# Patient Record
Sex: Female | Born: 1955
Health system: Southern US, Community
[De-identification: ages and names within clinical notes are randomized; demographics above are authoritative.]

## PROBLEM LIST (undated history)

## (undated) ENCOUNTER — Encounter

## (undated) ENCOUNTER — Ambulatory Visit: Payer: PRIVATE HEALTH INSURANCE

## (undated) ENCOUNTER — Ambulatory Visit

## (undated) ENCOUNTER — Encounter: Attending: Internal Medicine | Primary: Internal Medicine

## (undated) ENCOUNTER — Telehealth: Attending: Acute Care | Primary: Acute Care

## (undated) ENCOUNTER — Encounter: Attending: Primary Care | Primary: Primary Care

## (undated) ENCOUNTER — Encounter: Attending: Nurse Practitioner | Primary: Nurse Practitioner

## (undated) ENCOUNTER — Telehealth

## (undated) ENCOUNTER — Encounter: Attending: Cardiovascular Disease | Primary: Cardiovascular Disease

## (undated) ENCOUNTER — Ambulatory Visit: Attending: Cardiovascular Disease | Primary: Cardiovascular Disease

## (undated) ENCOUNTER — Encounter
Attending: Pharmacist Clinician (PhC)/ Clinical Pharmacy Specialist | Primary: Pharmacist Clinician (PhC)/ Clinical Pharmacy Specialist

## (undated) ENCOUNTER — Encounter: Attending: Oncology | Primary: Oncology

## (undated) ENCOUNTER — Ambulatory Visit: Payer: PRIVATE HEALTH INSURANCE | Attending: Nurse Practitioner | Primary: Nurse Practitioner

## (undated) ENCOUNTER — Encounter: Attending: Acute Care | Primary: Acute Care

## (undated) ENCOUNTER — Encounter: Attending: Hematology & Oncology | Primary: Hematology & Oncology

## (undated) DIAGNOSIS — C801 Malignant (primary) neoplasm, unspecified: Secondary | ICD-10-CM

## (undated) DIAGNOSIS — C9590 Leukemia, unspecified not having achieved remission: Secondary | ICD-10-CM

## (undated) DIAGNOSIS — M810 Age-related osteoporosis without current pathological fracture: Secondary | ICD-10-CM

## (undated) DIAGNOSIS — Z9221 Personal history of antineoplastic chemotherapy: Secondary | ICD-10-CM

## (undated) HISTORY — PX: SHOULDER SURGERY: SHX246

## (undated) HISTORY — PX: BREAST BIOPSY: SHX20

## (undated) HISTORY — PX: LIMBAL STEM CELL TRANSPLANT: SHX1969

## (undated) MED ORDER — ASCORBIC ACID (VITAMIN C) 500 MG TABLET: ORAL | 0 days

---
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
## 1975-01-12 HISTORY — PX: MOLE REMOVAL: SHX2046

## 2006-01-20 DIAGNOSIS — C4491 Basal cell carcinoma of skin, unspecified: Secondary | ICD-10-CM

## 2006-01-20 HISTORY — DX: Basal cell carcinoma of skin, unspecified: C44.91

## 2007-01-16 DIAGNOSIS — D239 Other benign neoplasm of skin, unspecified: Secondary | ICD-10-CM

## 2007-01-16 HISTORY — DX: Other benign neoplasm of skin, unspecified: D23.9

## 2007-03-16 ENCOUNTER — Ambulatory Visit: Payer: Self-pay | Admitting: Nurse Practitioner

## 2007-05-11 ENCOUNTER — Ambulatory Visit: Payer: Self-pay | Admitting: Unknown Physician Specialty

## 2008-03-21 ENCOUNTER — Ambulatory Visit: Payer: Self-pay | Admitting: Nurse Practitioner

## 2009-05-15 ENCOUNTER — Ambulatory Visit: Payer: Self-pay | Admitting: Nurse Practitioner

## 2010-01-11 HISTORY — PX: OTHER SURGICAL HISTORY: SHX169

## 2010-05-24 ENCOUNTER — Ambulatory Visit: Payer: Self-pay | Admitting: Internal Medicine

## 2010-05-28 ENCOUNTER — Ambulatory Visit: Payer: Self-pay | Admitting: Internal Medicine

## 2010-11-17 ENCOUNTER — Ambulatory Visit: Payer: Self-pay

## 2010-11-17 ENCOUNTER — Ambulatory Visit: Payer: Self-pay | Admitting: Unknown Physician Specialty

## 2011-05-19 ENCOUNTER — Ambulatory Visit: Payer: Self-pay | Admitting: Internal Medicine

## 2011-06-09 ENCOUNTER — Ambulatory Visit: Payer: Self-pay | Admitting: Internal Medicine

## 2012-03-28 ENCOUNTER — Ambulatory Visit: Payer: Self-pay

## 2012-06-15 ENCOUNTER — Ambulatory Visit: Payer: Self-pay | Admitting: Internal Medicine

## 2013-06-07 LAB — BASIC METABOLIC PANEL
BUN: 15 mg/dL (ref 4–21)
Creatinine: 0.8 mg/dL (ref 0.5–1.1)
GLUCOSE: 89 mg/dL
Sodium: 142 mmol/L (ref 137–147)

## 2013-06-07 LAB — TSH: TSH: 0.74 u[IU]/mL (ref 0.41–5.90)

## 2013-06-07 LAB — HEPATIC FUNCTION PANEL: Bilirubin, Total: 0.3 mg/dL

## 2013-07-12 ENCOUNTER — Ambulatory Visit: Payer: Self-pay | Admitting: Family Medicine

## 2013-07-26 ENCOUNTER — Ambulatory Visit: Payer: Self-pay | Admitting: Family Medicine

## 2013-07-26 LAB — HM MAMMOGRAPHY

## 2013-09-06 LAB — HM DEXA SCAN

## 2013-11-11 LAB — HM COLONOSCOPY

## 2013-11-15 ENCOUNTER — Ambulatory Visit: Payer: Self-pay | Admitting: Unknown Physician Specialty

## 2014-02-14 ENCOUNTER — Ambulatory Visit: Payer: Self-pay | Admitting: Nurse Practitioner

## 2014-07-16 ENCOUNTER — Encounter (INDEPENDENT_AMBULATORY_CARE_PROVIDER_SITE_OTHER): Payer: Self-pay

## 2014-07-16 ENCOUNTER — Encounter: Payer: Self-pay | Admitting: Nurse Practitioner

## 2014-07-16 ENCOUNTER — Telehealth: Payer: Self-pay | Admitting: Nurse Practitioner

## 2014-07-16 ENCOUNTER — Ambulatory Visit (INDEPENDENT_AMBULATORY_CARE_PROVIDER_SITE_OTHER): Payer: No Typology Code available for payment source | Admitting: Nurse Practitioner

## 2014-07-16 VITALS — BP 118/88 | HR 92 | Temp 98.2°F | Resp 16 | Ht 65.0 in | Wt 158.9 lb

## 2014-07-16 DIAGNOSIS — Z7689 Persons encountering health services in other specified circumstances: Secondary | ICD-10-CM

## 2014-07-16 DIAGNOSIS — M810 Age-related osteoporosis without current pathological fracture: Secondary | ICD-10-CM

## 2014-07-16 DIAGNOSIS — Z7189 Other specified counseling: Secondary | ICD-10-CM | POA: Diagnosis not present

## 2014-07-16 NOTE — Patient Instructions (Addendum)
Welcome to [facility]! Nice to meet you!   Please make an appointment to do your breast exam, PAP, and lab work.

## 2014-07-16 NOTE — Progress Notes (Signed)
Pre visit review using our clinic review tool, if applicable. No additional management support is needed unless otherwise documented below in the visit note. 

## 2014-07-16 NOTE — Telephone Encounter (Signed)
Pt would like to have labs done the same day as appt. Her next appt is 8.9.16 @ 9:30. Please advise.

## 2014-07-16 NOTE — Telephone Encounter (Signed)
Pt request lab work on the same day as upcoming appt. No lab orders in the systems. Please advise/msn

## 2014-07-16 NOTE — Telephone Encounter (Signed)
That will be fine and I will place orders. She is aware they are fasting.

## 2014-07-16 NOTE — Progress Notes (Signed)
Subjective:    Patient ID: Robyn Montgomery, female    DOB: 03-13-1955, 59 y.o.   MRN: [id number]  HPI   Robyn Montgomery is a 59 yo female with a establishing care today CC of questions on Evista.   1) Health Maintenance-   Diet- Tries to eat a fairly healthy diet   Exercise- Classes 4 x a week, walks 4 miles 4-5 x a week   Immunizations- unknown   Mammogram- 01/2014, Had one in June 2015- saw something suspicious and had her come back in Jan. Good for 1 year from Jan.   Pap- 2013, Due for one   Bone Density- Bone Density scan in 2013, Raloxifene   Colonoscopy- 2015, no problems   Eye Exam- 2/15  Dental Exam- UTD  LMP- 2000  2) Chronic Problems- N/A  3) Acute Problems-   Osteoporosis- Bone density scan in 2013, been on Raloxifene for years and would like to know if she can stop this, when she needs another scan, and asked about side effects.   Lab work in May of last year   Review of Systems  Constitutional: Negative for fever, chills, diaphoresis and fatigue.  HENT: Negative for sore throat and tinnitus.   Eyes: Negative for visual disturbance.  Respiratory: Negative for chest tightness, shortness of breath and wheezing.   Cardiovascular: Negative for chest pain, palpitations and leg swelling.  Gastrointestinal: Negative for nausea, vomiting and diarrhea.  Genitourinary: Negative for vaginal bleeding, vaginal discharge, difficulty urinating and vaginal pain.  Musculoskeletal: Negative for back pain and neck pain.  Skin: Negative for rash.  Neurological: Negative for dizziness, tremors, weakness, numbness and headaches.  Hematological: Does not bruise/bleed easily.  Psychiatric/Behavioral: Negative for suicidal ideas and sleep disturbance. The patient is not nervous/anxious.    History reviewed. No pertinent past medical history.  History   Social History  . Marital Status: Married    Spouse Name: N/A  . Number of Children: N/A  . Years of Education: N/A   Occupational  History  . Not on file.   Social History Main Topics  . Smoking status: Never Smoker   . Smokeless tobacco: Never Used  . Alcohol Use: 0.0 oz/week    0 Standard drinks or equivalent per week     Comment: Rare  . Drug Use: No  . Sexual Activity:    Partners: Male     Comment: Husband    Other Topics Concern  . 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 Mole removal  1977    Removed from left breast  . Broken shoulder Right 2012    Plates and screws put in    Family History  Problem Relation Age of Onset  . Arthritis Mother   . Hypertension Mother   . Cancer Mother 8    Breast  . Osteoporosis Mother   . Parkinsonism Father   . Heart disease Maternal Uncle   . Cancer Paternal Grandfather     Oral    Not on File  No current outpatient prescriptions on file prior to visit.   No current facility-administered medications on file prior to visit.       Objective:  Physical Exam  Constitutional: She is oriented to person, place, and time. She appears well-developed and well-nourished. No distress.  BP 118/88 mmHg  Pulse 92  Temp(Src) 98.2 F (36.8 C)  Resp 16  Ht 5\' 5"  (1.651 m)  Wt 158 lb 14.4 oz (72.077 kg)  BMI 26.44 kg/m2  SpO2 96%   HENT:  Head: Normocephalic and atraumatic.  Right Ear: External ear normal.  Left Ear: External ear normal.  Nose: Nose normal.  Mouth/Throat: Oropharynx is clear and moist. No oropharyngeal exudate.  TMs and canals clear bilaterally  Eyes: Conjunctivae and EOM are normal. Pupils are equal, round, and reactive to light. Right eye exhibits no discharge. Left eye exhibits no discharge. No scleral icterus.  Neck: Normal range of motion. Neck supple. No thyromegaly present.  Cardiovascular: Normal rate, regular rhythm,  normal heart sounds and intact distal pulses.  Exam reveals no gallop and no friction rub.   No murmur heard. Pulmonary/Chest: Effort normal and breath sounds normal. No respiratory distress. She has no wheezes. She has no rales. She exhibits no tenderness.  Abdominal: Soft. Bowel sounds are normal. She exhibits no distension and no mass. There is no tenderness. There is no rebound and no guarding.  Musculoskeletal: Normal range of motion. She exhibits no edema or tenderness.  Lymphadenopathy:    She has no cervical adenopathy.  Neurological: She is alert and oriented to person, place, and time. She has normal reflexes. No cranial nerve deficit. She exhibits normal muscle tone. Coordination normal.  Skin: Skin is warm and dry. No rash noted. She is not diaphoretic. No erythema. No pallor.  Psychiatric: She has a normal mood and affect. Her behavior is normal. Judgment and thought content normal.      Assessment & Plan:

## 2014-07-17 NOTE — Telephone Encounter (Signed)
Informed pt about labs and appt, pt verbalized understanding

## 2014-07-27 DIAGNOSIS — Z0000 Encounter for general adult medical examination without abnormal findings: Secondary | ICD-10-CM | POA: Insufficient documentation

## 2014-07-27 DIAGNOSIS — M810 Age-related osteoporosis without current pathological fracture: Secondary | ICD-10-CM | POA: Insufficient documentation

## 2014-07-27 DIAGNOSIS — Z7689 Persons encountering health services in other specified circumstances: Secondary | ICD-10-CM | POA: Insufficient documentation

## 2014-07-27 NOTE — Assessment & Plan Note (Addendum)
Discussed acute and chronic issues. Reviewed health maintenance measures, PFSHx, and immunizations. Obtain records from previous facility.   Due to time constraints today and pt preference she would like to return for clinic breast exam, PAP, and fasting labs at another visit.

## 2014-07-27 NOTE — Assessment & Plan Note (Signed)
Patient on Evista. Last Dexa in 2013- need records from previous facility before deciding course of action and whether to stay on her Evista or discontinue.

## 2014-07-27 NOTE — Addendum Note (Signed)
Addended by: Rubbie Battiest on: 07/27/2014 07:59 AM   Modules accepted: Level of Service

## 2014-08-20 ENCOUNTER — Ambulatory Visit (INDEPENDENT_AMBULATORY_CARE_PROVIDER_SITE_OTHER): Payer: No Typology Code available for payment source | Admitting: Nurse Practitioner

## 2014-08-20 ENCOUNTER — Other Ambulatory Visit: Payer: Self-pay | Admitting: Nurse Practitioner

## 2014-08-20 ENCOUNTER — Telehealth: Payer: Self-pay

## 2014-08-20 ENCOUNTER — Other Ambulatory Visit (HOSPITAL_COMMUNITY)
Admission: RE | Admit: 2014-08-20 | Discharge: 2014-08-20 | Disposition: A | Discharge status: Home / Self Care | Payer: No Typology Code available for payment source | Source: Ambulatory Visit | Attending: Nurse Practitioner | Admitting: Nurse Practitioner

## 2014-08-20 VITALS — BP 120/80 | HR 76 | Temp 98.0°F | Resp 16 | Ht 65.0 in | Wt 157.6 lb

## 2014-08-20 DIAGNOSIS — Z0000 Encounter for general adult medical examination without abnormal findings: Secondary | ICD-10-CM

## 2014-08-20 DIAGNOSIS — Z1151 Encounter for screening for human papillomavirus (HPV): Secondary | ICD-10-CM | POA: Diagnosis present

## 2014-08-20 DIAGNOSIS — Z01419 Encounter for gynecological examination (general) (routine) without abnormal findings: Secondary | ICD-10-CM

## 2014-08-20 DIAGNOSIS — M810 Age-related osteoporosis without current pathological fracture: Secondary | ICD-10-CM

## 2014-08-20 LAB — CBC WITH DIFFERENTIAL/PLATELET
BASOS PCT: 0.8 % (ref 0.0–3.0)
Basophils Absolute: 0 10*3/uL (ref 0.0–0.1)
EOS PCT: 3.8 % (ref 0.0–5.0)
Eosinophils Absolute: 0.2 10*3/uL (ref 0.0–0.7)
HCT: 39.2 % (ref 36.0–46.0)
Hemoglobin: 13.2 g/dL (ref 12.0–15.0)
LYMPHS PCT: 35.7 % (ref 12.0–46.0)
Lymphs Abs: 1.7 10*3/uL (ref 0.7–4.0)
MCHC: 33.7 g/dL (ref 30.0–36.0)
MCV: 83.9 fl (ref 78.0–100.0)
MONOS PCT: 6.5 % (ref 3.0–12.0)
Monocytes Absolute: 0.3 10*3/uL (ref 0.1–1.0)
Neutro Abs: 2.5 10*3/uL (ref 1.4–7.7)
Neutrophils Relative %: 53.2 % (ref 43.0–77.0)
Platelets: 262 10*3/uL (ref 150.0–400.0)
RBC: 4.67 Mil/uL (ref 3.87–5.11)
RDW: 14.1 % (ref 11.5–15.5)
WBC: 4.6 10*3/uL (ref 4.0–10.5)

## 2014-08-20 LAB — LIPID PANEL
Cholesterol: 245 mg/dL — ABNORMAL HIGH (ref 0–200)
HDL: 56.9 mg/dL (ref >39.00)
LDL CALC: 154 mg/dL — AB (ref 0–99)
NonHDL: 188.2
Total CHOL/HDL Ratio: 4
Triglycerides: 170 mg/dL — ABNORMAL HIGH (ref 0.0–149.0)
VLDL: 34 mg/dL (ref 0.0–40.0)

## 2014-08-20 LAB — COMPREHENSIVE METABOLIC PANEL
ALK PHOS: 75 U/L (ref 39–117)
ALT: 13 U/L (ref 0–35)
AST: 14 U/L (ref 0–37)
Albumin: 4.3 g/dL (ref 3.5–5.2)
BILIRUBIN TOTAL: 0.4 mg/dL (ref 0.2–1.2)
BUN: 11 mg/dL (ref 6–23)
CO2: 29 meq/L (ref 19–32)
CREATININE: 0.74 mg/dL (ref 0.40–1.20)
Calcium: 9.8 mg/dL (ref 8.4–10.5)
Chloride: 105 mEq/L (ref 96–112)
GFR: 84.71 mL/min (ref >60.00)
Glucose, Bld: 98 mg/dL (ref 70–99)
Potassium: 5.2 mEq/L — ABNORMAL HIGH (ref 3.5–5.1)
SODIUM: 141 meq/L (ref 135–145)
Total Protein: 6.9 g/dL (ref 6.0–8.3)

## 2014-08-20 LAB — TSH: TSH: 1.08 u[IU]/mL (ref 0.35–4.50)

## 2014-08-20 LAB — HEMOGLOBIN A1C: HEMOGLOBIN A1C: 5.8 % (ref 4.6–6.5)

## 2014-08-20 MED ORDER — RALOXIFENE HCL 60 MG PO TABS: 60.0000 mg | ORAL_TABLET | Freq: Every day | ORAL | Status: DC

## 2014-08-20 NOTE — Progress Notes (Signed)
Patient ID: Robyn Montgomery, female    DOB: 07/05/1955  Age: 59 y.o. MRN: [id number]  CC: Gynecologic Exam   HPI Robyn Montgomery presents for Robyn Montgomery annual physical exam with PAP.   1) Health Maintenance-   Diet- No change   Exercise- No change  Immunizations- N/A  Mammogram- 01/2014   Pap- Due today  Labs- Due today  Needs refill of Evista  History Robyn Montgomery has no past medical history on file.   Robyn Montgomery has past surgical history that includes Mole removal (1977) and broken shoulder (Right, 2012).   Robyn Montgomery family history includes Arthritis in Robyn Montgomery mother; Cancer in Robyn Montgomery paternal grandfather; Cancer (age of onset: 48) in Robyn Montgomery mother; Heart disease in Robyn Montgomery maternal uncle; Hypertension in Robyn Montgomery mother; Osteoporosis in Robyn Montgomery mother; Parkinsonism in Robyn Montgomery father.Robyn Montgomery reports that Robyn Montgomery has never smoked. Robyn Montgomery has never used smokeless tobacco. Robyn Montgomery reports that Robyn Montgomery drinks alcohol. Robyn Montgomery reports that Robyn Montgomery does not use illicit drugs.  Outpatient Prescriptions Prior to Visit  Medication Sig Dispense Refill  . raloxifene (EVISTA) 60 MG tablet Take 60 mg by mouth daily.     No facility-administered medications prior to visit.    ROS Review of Systems  Constitutional: Negative for fever, chills, diaphoresis, fatigue and unexpected weight change.  HENT: Negative for tinnitus and trouble swallowing.   Eyes: Negative for visual disturbance.  Respiratory: Negative for chest tightness, shortness of breath and wheezing.   Gastrointestinal: Negative for nausea, vomiting, abdominal pain, diarrhea, constipation and blood in stool.  Endocrine: Negative for polydipsia, polyphagia and polyuria.  Genitourinary: Negative for dysuria, hematuria, vaginal discharge and vaginal pain.  Musculoskeletal: Negative for myalgias, back pain, arthralgias and gait problem.  Skin: Negative for color change and rash.  Neurological: Negative for dizziness, weakness, numbness and headaches.  Hematological: Does not bruise/bleed easily.   Psychiatric/Behavioral: Negative for suicidal ideas and sleep disturbance. The patient is not nervous/anxious.     Objective:  BP 120/80 mmHg  Pulse 76  Temp(Src) 98 F (36.7 C)  Resp 16  Ht 5\' 5"  (1.651 m)  Wt 157 lb 9.6 oz (71.487 kg)  BMI 26.23 kg/m2  SpO2 97%  Physical Exam  Constitutional: Robyn Montgomery is oriented to person, place, and time. Robyn Montgomery appears well-developed and well-nourished. No distress.  HENT:  Head: Normocephalic and atraumatic.  Right Ear: External ear normal.  Left Ear: External ear normal.  Nose: Nose normal.  Mouth/Throat: Oropharynx is clear and moist. No oropharyngeal exudate.  TMs and canals clear bilaterally  Eyes: Conjunctivae and EOM are normal. Pupils are equal, round, and reactive to light. Right eye exhibits no discharge. Left eye exhibits no discharge. No scleral icterus.  Neck: Normal range of motion. Neck supple. No thyromegaly present.  Cardiovascular: Normal rate, regular rhythm, normal heart sounds and intact distal pulses.  Exam reveals no gallop and no friction rub.   No murmur heard. Pulmonary/Chest: Effort normal and breath sounds normal. No respiratory distress. Robyn Montgomery has no wheezes. Robyn Montgomery has no rales. Robyn Montgomery exhibits no tenderness.  Clinical breast exam- no findings other than dense breast tissue  Abdominal: Soft. Bowel sounds are normal. Robyn Montgomery exhibits no distension and no mass. There is no tenderness. There is no rebound and no guarding.  Genitourinary: Vagina normal. No vaginal discharge found.  Musculoskeletal: Normal range of motion. Robyn Montgomery exhibits no edema or tenderness.  Lymphadenopathy:    Robyn Montgomery has no cervical adenopathy.  Neurological: Robyn Montgomery is alert and oriented to person, place, and time. Robyn Montgomery has normal reflexes. No cranial  nerve deficit. Robyn Montgomery exhibits normal muscle tone. Coordination normal.  Skin: Skin is warm and dry. No rash noted. Robyn Montgomery is not diaphoretic. No erythema. No pallor.  Psychiatric: Robyn Montgomery has a normal mood and affect. Robyn Montgomery behavior is  normal. Judgment and thought content normal.    Assessment & Plan:   Magdelyn was seen today for gynecologic exam.  Diagnoses and all orders for this visit:  Routine general medical examination at a health care facility -     Cytology - PAP  Encounter for routine gynecological examination -     CBC with Differential/Platelet -     Comprehensive metabolic panel -     Hemoglobin A1c -     Lipid panel -     TSH -     Cytology - PAP  Osteoporosis  Other orders -     raloxifene (EVISTA) 60 MG tablet; Take 1 tablet (60 mg total) by mouth daily.   I have changed Robyn Montgomery's raloxifene.  Meds ordered this encounter  Medications  . raloxifene (EVISTA) 60 MG tablet    Sig: Take 1 tablet (60 mg total) by mouth daily.    Dispense:  30 tablet    Refill:  2    Order Specific Question:  Supervising Provider    Answer:  Crecencio Mc [[id number]]     Follow-up: Return in about 1 year (around 08/20/2015) for CPE.

## 2014-08-20 NOTE — Patient Instructions (Signed)

## 2014-08-21 ENCOUNTER — Telehealth: Payer: Self-pay

## 2014-08-21 NOTE — Telephone Encounter (Signed)
-----   Message from Rubbie Battiest, NP sent at 08/20/2014 10:13 AM EDT ----- Please call [facility] and see if they can fax Robyn Montgomery's records to [place]. Past 2 years only and labs. Thanks!

## 2014-08-21 NOTE — Telephone Encounter (Signed)
Called [facility] on 8.10.16 for last 2 years of records and labs.

## 2014-08-22 ENCOUNTER — Encounter: Payer: Self-pay | Admitting: Nurse Practitioner

## 2014-08-22 DIAGNOSIS — Z01419 Encounter for gynecological examination (general) (routine) without abnormal findings: Secondary | ICD-10-CM | POA: Insufficient documentation

## 2014-08-22 LAB — CYTOLOGY - PAP

## 2014-08-22 NOTE — Assessment & Plan Note (Signed)
PAP and clinical breast exam performed today without findings. Will follow.

## 2014-08-22 NOTE — Assessment & Plan Note (Signed)
Discussed acute and chronic issues. Reviewed health maintenance measures, PFSHx, and immunizations. Obtain routine labs TSH, Lipid panel, CBC w/ diff, A1c, and CMET.   PAP and clinical breast exam performed today without significant findings.

## 2014-08-22 NOTE — Assessment & Plan Note (Signed)
Refilled evista. Will follow

## 2014-08-26 NOTE — Telephone Encounter (Signed)
Received medical records from [facility] and abstracted

## 2014-10-22 ENCOUNTER — Telehealth: Payer: Self-pay | Admitting: *Deleted

## 2014-10-22 ENCOUNTER — Other Ambulatory Visit: Payer: Self-pay

## 2014-10-22 MED ORDER — RALOXIFENE HCL 60 MG PO TABS: 60.0000 mg | ORAL_TABLET | Freq: Every day | ORAL | Status: DC

## 2014-10-22 NOTE — Telephone Encounter (Signed)
Patient requested a medication refill  For raloxifene, patient request a year supply.

## 2014-10-22 NOTE — Telephone Encounter (Signed)
I have completed her request.

## 2015-01-19 ENCOUNTER — Encounter: Payer: Self-pay | Admitting: Nurse Practitioner

## 2015-01-20 ENCOUNTER — Other Ambulatory Visit: Payer: Self-pay

## 2015-01-20 MED ORDER — RALOXIFENE HCL 60 MG PO TABS: 60.0000 mg | ORAL_TABLET | Freq: Every day | ORAL | Status: DC

## 2015-02-17 ENCOUNTER — Other Ambulatory Visit: Payer: Self-pay | Admitting: Nurse Practitioner

## 2015-02-17 DIAGNOSIS — Z1231 Encounter for screening mammogram for malignant neoplasm of breast: Secondary | ICD-10-CM

## 2015-02-25 ENCOUNTER — Ambulatory Visit
Admission: RE | Admit: 2015-02-25 | Discharge: 2015-02-25 | Disposition: A | Discharge status: Home / Self Care | Payer: BLUE CROSS/BLUE SHIELD | Source: Ambulatory Visit | Attending: Nurse Practitioner | Admitting: Nurse Practitioner

## 2015-02-25 DIAGNOSIS — Z1231 Encounter for screening mammogram for malignant neoplasm of breast: Secondary | ICD-10-CM | POA: Diagnosis present

## 2015-08-12 DIAGNOSIS — I824Z2 Acute embolism and thrombosis of unspecified deep veins of left distal lower extremity: Secondary | ICD-10-CM | POA: Insufficient documentation

## 2015-08-21 ENCOUNTER — Ambulatory Visit
Admission: EM | Admit: 2015-08-21 | Discharge: 2015-08-21 | Disposition: A | Discharge status: Home / Self Care | Payer: BLUE CROSS/BLUE SHIELD | Attending: Family Medicine | Admitting: Family Medicine

## 2015-08-21 ENCOUNTER — Encounter: Payer: Self-pay | Admitting: *Deleted

## 2015-08-21 DIAGNOSIS — H6981 Other specified disorders of Eustachian tube, right ear: Secondary | ICD-10-CM | POA: Diagnosis not present

## 2015-08-21 DIAGNOSIS — J029 Acute pharyngitis, unspecified: Secondary | ICD-10-CM | POA: Diagnosis not present

## 2015-08-21 DIAGNOSIS — J019 Acute sinusitis, unspecified: Secondary | ICD-10-CM

## 2015-08-21 LAB — RAPID STREP SCREEN (MED CTR MEBANE ONLY): Streptococcus, Group A Screen (Direct): NEGATIVE

## 2015-08-21 MED ORDER — CEFUROXIME AXETIL 500 MG PO TABS: 500.0000 mg | ORAL_TABLET | Freq: Two times a day (BID) | ORAL | 0 refills | Status: DC

## 2015-08-21 MED ORDER — FEXOFENADINE-PSEUDOEPHED ER 180-240 MG PO TB24: 1.0000 | ORAL_TABLET | Freq: Every day | ORAL | 0 refills | Status: DC

## 2015-08-21 NOTE — ED Triage Notes (Signed)
Sore throat, headache, fever, and head congestion x1 week.

## 2015-08-21 NOTE — ED Provider Notes (Signed)
[facility]    CSN: [id number] Arrival date & time: 08/21/15  [id number]  First Provider Contact:  First MD Initiated Contact with Patient 08/21/15 [phone]        History   Chief Complaint Chief Complaint  Patient presents with  . Sore Throat  . Fever  . Headache  . Nasal Congestion    HPI Robyn Montgomery is a 60 y.o. female.   Patient is here because of sore throat. Sore throats been going on for at least a week since last week Wednesday. She denies any ear pain but does have nasal congestion. States that her normal temperature is about 97 excision running 99 last few days. She denies any medical problem but she does have a history of osteopenia. She is allergic to penicillin and sulfur. She does not smoke no significant family medical history pertinent to today's visit   The history is provided by the patient. No language interpreter was used.  Sore Throat  This is a new problem. The current episode started more than 1 week ago. The problem occurs constantly. The problem has been gradually worsening. Pertinent negatives include no chest pain, no abdominal pain, no headaches and no shortness of breath. Nothing aggravates the symptoms. Nothing relieves the symptoms. She has tried nothing for the symptoms. The treatment provided no relief.  Fever  Temp source:  Subjective Timing:  Intermittent Progression:  Waxing and waning Chronicity:  New Associated symptoms: congestion, rhinorrhea and sore throat   Associated symptoms: no chest pain and no headaches   Headache  Associated symptoms: congestion, fever and sore throat   Associated symptoms: no abdominal pain     History reviewed. No pertinent past medical history.  Patient Active Problem List   Diagnosis Date Noted  . Encounter for routine gynecological examination 08/22/2014  . Routine general medical examination at a health care facility 07/27/2014  . Encounter to establish care 07/27/2014  . Osteoporosis  07/27/2014    Past Surgical History:  Procedure Laterality Date  . broken shoulder Right 2012   Plates and screws put in  . MOLE REMOVAL  1977   Removed from left breast    OB History    No data available       Home Medications    Prior to Admission medications   Medication Sig Start Date End Date Taking? Authorizing Provider  raloxifene (EVISTA) 60 MG tablet Take 1 tablet (60 mg total) by mouth daily. 01/20/15  Yes Rubbie Battiest, NP  cefUROXime (CEFTIN) 500 MG tablet Take 1 tablet (500 mg total) by mouth 2 (two) times daily. 08/21/15   Frederich Cha, MD  fexofenadine-pseudoephedrine (ALLEGRA-D ALLERGY & CONGESTION) 180-240 MG 24 hr tablet Take 1 tablet by mouth daily. 08/21/15   Frederich Cha, MD    Family History Family History  Problem Relation Age of Onset  . Arthritis Mother   . Hypertension Mother   . Cancer Mother 26    Breast  . Osteoporosis Mother   . Breast cancer Mother 24  . Parkinsonism Father   . Heart disease Maternal Uncle   . Cancer Paternal Grandfather     Oral    Social History Social History  Substance Use Topics  . Smoking status: Never Smoker  . Smokeless tobacco: Never Used  . Alcohol use 0.0 oz/week     Comment: Rare     Allergies   Penicillins and Sulfa antibiotics   Review of Systems Review of Systems  Constitutional: Positive for  fever.  HENT: Positive for congestion, rhinorrhea and sore throat.   Respiratory: Negative for shortness of breath.   Cardiovascular: Negative for chest pain.  Gastrointestinal: Negative for abdominal pain.  Neurological: Negative for headaches.  All other systems reviewed and are negative.    Physical Exam Triage Vital Signs ED Triage Vitals [08/21/15 0940]  Enc Vitals Group     BP 115/75     Pulse Rate (!) 110     Resp 16     Temp 97.5 F (36.4 C)     Temp Source Oral     SpO2 95 %     Weight 150 lb (68 kg)     Height 5\' 4"  (1.626 m)     Head Circumference      Peak Flow      Pain Score        Pain Loc      Pain Edu?      Excl. in [place]?    No data found.   Updated Vital Signs BP 115/75 (BP Location: Left Arm)   Pulse (!) 110   Temp 97.5 F (36.4 C) (Oral)   Resp 16   Ht 5\' 4"  (1.626 m)   Wt 150 lb (68 kg)   SpO2 95%   BMI 25.75 kg/m   Visual Acuity Right Eye Distance:   Left Eye Distance:   Bilateral Distance:    Right Eye Near:   Left Eye Near:    Bilateral Near:     Physical Exam  Constitutional: She is oriented to person, place, and time. She appears well-developed and well-nourished. No distress.  HENT:  Head: Normocephalic and atraumatic.  Right Ear: External ear normal. Tympanic membrane is erythematous and bulging. A middle ear effusion is present.  Left Ear: Tympanic membrane is bulging. A middle ear effusion is present.  Eyes: Pupils are equal, round, and reactive to light.  Neck: Normal range of motion. Neck supple.  Cardiovascular: Normal rate and regular rhythm.   Pulmonary/Chest: Effort normal and breath sounds normal.  Musculoskeletal: Normal range of motion.  Lymphadenopathy:    She has cervical adenopathy.  Neurological: She is alert and oriented to person, place, and time.  Skin: Skin is warm. She is not diaphoretic.  Psychiatric: She has a normal mood and affect.  Vitals reviewed.    UC Treatments / Results  Labs (all labs ordered are listed, but only abnormal results are displayed) Labs Reviewed  RAPID STREP SCREEN (NOT AT [facility])  CULTURE, GROUP A STREP [facility])    EKG  EKG Interpretation None       Radiology No results found.  Procedures Procedures (including critical care time)  Medications Ordered in UC Medications - No data to display   Initial Impression / Assessment and Plan / UC Course  I have reviewed the triage vital signs and the nursing notes.  Pertinent labs & imaging results that were available during my care of the patient were reviewed by me and considered in my medical decision making (see chart  for details).   Results for orders placed or performed during the hospital encounter of 08/21/15  Rapid strep screen  Result Value Ref Range   Streptococcus, Group A Screen (Direct) NEGATIVE NEGATIVE    Clinical Course  Will treat patient for sinus infection eustachian tube dysfunction and pharyngitis with Ceftin 500 mg twice a day. Since strep test was negative we'll try a Z-Pak. Will also place on Allegra-D 24 hours 1 tablet a day follow-up  PCP 1-2 weeks if needed.  Final Clinical Impressions(s) / UC Diagnoses   Final diagnoses:  Pharyngitis  Acute sinusitis, recurrence not specified, unspecified location  Eustachian tube dysfunction, right    New Prescriptions Discharge Medication List as of 08/21/2015 10:33 AM    START taking these medications   Details  cefUROXime (CEFTIN) 500 MG tablet Take 1 tablet (500 mg total) by mouth 2 (two) times daily., Starting Thu 08/21/2015, Normal    fexofenadine-pseudoephedrine (ALLEGRA-D ALLERGY & CONGESTION) 180-240 MG 24 hr tablet Take 1 tablet by mouth daily., Starting Thu 08/21/2015, Normal          Note: This dictation was prepared with Dragon dictation along with smaller phrase technology. Any transcriptional errors that result from this process are unintentional.   Frederich Cha, MD 08/21/15 1057

## 2015-08-24 LAB — CULTURE, GROUP A STREP (THRC)

## 2015-08-25 ENCOUNTER — Other Ambulatory Visit: Payer: Self-pay | Admitting: Podiatry

## 2015-08-25 ENCOUNTER — Ambulatory Visit (INDEPENDENT_AMBULATORY_CARE_PROVIDER_SITE_OTHER): Payer: BLUE CROSS/BLUE SHIELD | Admitting: Podiatry

## 2015-08-25 ENCOUNTER — Encounter: Payer: Self-pay | Admitting: Podiatry

## 2015-08-25 ENCOUNTER — Ambulatory Visit (INDEPENDENT_AMBULATORY_CARE_PROVIDER_SITE_OTHER): Payer: BLUE CROSS/BLUE SHIELD

## 2015-08-25 VITALS — BP 127/83 | HR 104 | Resp 16

## 2015-08-25 DIAGNOSIS — M25572 Pain in left ankle and joints of left foot: Secondary | ICD-10-CM | POA: Diagnosis not present

## 2015-08-25 DIAGNOSIS — M109 Gout, unspecified: Secondary | ICD-10-CM | POA: Diagnosis not present

## 2015-08-25 DIAGNOSIS — I82402 Acute embolism and thrombosis of unspecified deep veins of left lower extremity: Secondary | ICD-10-CM | POA: Diagnosis not present

## 2015-08-25 MED ORDER — COLCHICINE 0.6 MG PO TABS: 0.6000 mg | ORAL_TABLET | Freq: Every day | ORAL | 0 refills | Status: DC

## 2015-08-25 NOTE — Progress Notes (Signed)
   Subjective:    Patient ID: [name], female    DOB: 12-Feb-1955, 60 y.o.   MRN: [id number]  HPI: She presents today with a 2 week duration of pain and swelling to the medial aspect of the left foot and ankle. She states that proximally 2 weeks ago she was on a bus trip headed to [facility] when she noticed swelling and pain in her foot. She went to urgent care recently and was told that she had nothing more than a sprain and was provided a steroid and pain medication. She denies trauma.    Review of Systems  Musculoskeletal: Positive for arthralgias.  All other systems reviewed and are negative.      Objective:   Physical Exam: Vital signs are stable she is alert and oriented 3. Pulses are palpable. Neurologic sensorium is intact. Deep tendon reflexes are intact. Muscle strength is normal. Deep tendon reflexes are intact. Orthopedic evaluation demonstrate solid with cystlike of full range of motion without crepitation. She has no pain on range of motion of any of her tendons with exception of the FHL and the FDL. Radiographically demonstrates no major osseous maladies other than soft tissue swelling in the posterior leg and ankle anterior to the Achilles tendon. Medial aspect of the left foot and ankle does demonstrate pain on palpation just behind the medial and inferior to the medial malleolus. This is warm to the touch and does demonstrate some mild ecchymosis. No open lesions or wounds are noted. She has no pain on medial and lateral compression of the calf. No swelling in the calf is noted.        Assessment & Plan:  Assessment: Cannot rule out gout due to the erythema and the severe pain and the warmth. I am also considering venous insufficiency or blood clot locally.  Plan: At this point I am requesting a CBC, a d-dimer, and an arthritic profile. I prescribed colchicine 0.6 mg 1 by mouth daily. Also placed her in a Cam Walker with a compression anklet. We will notify her as  to the results of her blood work. I also recommended warm compresses and a daily aspirin. I will follow up with her in 1 week and if not improvement consider MRI.

## 2015-08-26 ENCOUNTER — Other Ambulatory Visit: Payer: Self-pay | Admitting: Podiatry

## 2015-08-26 ENCOUNTER — Encounter: Payer: Self-pay | Admitting: Emergency Medicine

## 2015-08-26 ENCOUNTER — Emergency Department: Payer: BLUE CROSS/BLUE SHIELD

## 2015-08-26 ENCOUNTER — Inpatient Hospital Stay
Admission: EM | Admit: 2015-08-26 | Discharge: 2015-08-27 | DRG: 841 | Disposition: A | Discharge status: Other Acute Inpatient Hospital | Payer: BLUE CROSS/BLUE SHIELD | Attending: Internal Medicine | Admitting: Internal Medicine

## 2015-08-26 ENCOUNTER — Telehealth: Payer: Self-pay | Admitting: *Deleted

## 2015-08-26 ENCOUNTER — Ambulatory Visit: Payer: No Typology Code available for payment source | Admitting: Nurse Practitioner

## 2015-08-26 DIAGNOSIS — O2230 Deep phlebothrombosis in pregnancy, unspecified trimester: Secondary | ICD-10-CM

## 2015-08-26 DIAGNOSIS — C92Z0 Other myeloid leukemia not having achieved remission: Secondary | ICD-10-CM | POA: Diagnosis not present

## 2015-08-26 DIAGNOSIS — M810 Age-related osteoporosis without current pathological fracture: Secondary | ICD-10-CM | POA: Diagnosis present

## 2015-08-26 DIAGNOSIS — I82402 Acute embolism and thrombosis of unspecified deep veins of left lower extremity: Secondary | ICD-10-CM

## 2015-08-26 DIAGNOSIS — C9590 Leukemia, unspecified not having achieved remission: Secondary | ICD-10-CM | POA: Diagnosis present

## 2015-08-26 DIAGNOSIS — I82442 Acute embolism and thrombosis of left tibial vein: Secondary | ICD-10-CM | POA: Diagnosis present

## 2015-08-26 DIAGNOSIS — D649 Anemia, unspecified: Secondary | ICD-10-CM

## 2015-08-26 DIAGNOSIS — C9100 Acute lymphoblastic leukemia not having achieved remission: Secondary | ICD-10-CM

## 2015-08-26 DIAGNOSIS — D696 Thrombocytopenia, unspecified: Secondary | ICD-10-CM

## 2015-08-26 DIAGNOSIS — C9200 Acute myeloblastic leukemia, not having achieved remission: Secondary | ICD-10-CM | POA: Diagnosis present

## 2015-08-26 DIAGNOSIS — Z803 Family history of malignant neoplasm of breast: Secondary | ICD-10-CM | POA: Diagnosis not present

## 2015-08-26 DIAGNOSIS — R160 Hepatomegaly, not elsewhere classified: Secondary | ICD-10-CM

## 2015-08-26 DIAGNOSIS — M25572 Pain in left ankle and joints of left foot: Secondary | ICD-10-CM | POA: Diagnosis not present

## 2015-08-26 HISTORY — DX: Age-related osteoporosis without current pathological fracture: M81.0

## 2015-08-26 LAB — CBC
HEMATOCRIT: 22 % — AB (ref 35.0–47.0)
HEMOGLOBIN: 7.4 g/dL — AB (ref 12.0–16.0)
MCH: 30.5 pg (ref 26.0–34.0)
MCHC: 33.7 g/dL (ref 32.0–36.0)
MCV: 90.4 fL (ref 80.0–100.0)
Platelets: 72 10*3/uL — ABNORMAL LOW (ref 150–440)
RBC: 2.44 MIL/uL — ABNORMAL LOW (ref 3.80–5.20)
RDW: 19.8 % — ABNORMAL HIGH (ref 11.5–14.5)
WBC: 158.3 10*3/uL (ref 3.6–11.0)

## 2015-08-26 LAB — DIFFERENTIAL
BASOS ABS: 0 10*3/uL (ref 0–0.1)
BLASTS: 91 %
Band Neutrophils: 0 %
Basophils Relative: 0 %
EOS ABS: 0 10*3/uL (ref 0–0.7)
EOS PCT: 0 %
LYMPHS ABS: 12.7 10*3/uL — AB (ref 1.0–3.6)
Lymphocytes Relative: 8 %
METAMYELOCYTES PCT: 0 %
MONO ABS: 1.6 10*3/uL — AB (ref 0.2–0.9)
MYELOCYTES: 0 %
Monocytes Relative: 1 %
NEUTROS ABS: 0 10*3/uL — AB (ref 1.4–6.5)
Neutrophils Relative %: 0 %
Other: 0 %
PROMYELOCYTES ABS: 0 %
nRBC: 1 /100 WBC — ABNORMAL HIGH

## 2015-08-26 LAB — ABO/RH: ABO/RH(D): O POS

## 2015-08-26 LAB — BASIC METABOLIC PANEL
Anion gap: 8 (ref 5–15)
BUN: 15 mg/dL (ref 6–20)
CHLORIDE: 99 mmol/L — AB (ref 101–111)
CO2: 28 mmol/L (ref 22–32)
CREATININE: 0.75 mg/dL (ref 0.44–1.00)
Calcium: 9 mg/dL (ref 8.9–10.3)
GFR calc non Af Amer: >60 mL/min (ref >60)
Glucose, Bld: 118 mg/dL — ABNORMAL HIGH (ref 65–99)
Potassium: 3.5 mmol/L (ref 3.5–5.1)
Sodium: 135 mmol/L (ref 135–145)

## 2015-08-26 LAB — BRAIN NATRIURETIC PEPTIDE: B Natriuretic Peptide: 33 pg/mL (ref 0.0–100.0)

## 2015-08-26 LAB — TROPONIN I: TROPONIN I: 0.09 ng/mL — AB (ref <0.03)

## 2015-08-26 LAB — PATHOLOGIST SMEAR REVIEW

## 2015-08-26 MED ORDER — IOPAMIDOL (ISOVUE-370) INJECTION 76%
75.0000 mL | Freq: Once | INTRAVENOUS | Status: AC | PRN
  Administered 2015-08-26: 75 mL via INTRAVENOUS

## 2015-08-26 MED ORDER — ONDANSETRON HCL 4 MG PO TABS: 4.0000 mg | ORAL_TABLET | Freq: Four times a day (QID) | ORAL | Status: DC | PRN

## 2015-08-26 MED ORDER — ACETAMINOPHEN 650 MG RE SUPP: 650.0000 mg | Freq: Four times a day (QID) | RECTAL | Status: DC | PRN

## 2015-08-26 MED ORDER — ENOXAPARIN SODIUM 80 MG/0.8ML ~~LOC~~ SOLN
1.0000 mg/kg | Freq: Two times a day (BID) | SUBCUTANEOUS | Status: DC
  Administered 2015-08-26 – 2015-08-27 (×2): 65 mg via SUBCUTANEOUS
  Filled 2015-08-26 (×2): qty 0.8

## 2015-08-26 MED ORDER — ONDANSETRON HCL 4 MG/2ML IJ SOLN: 4.0000 mg | Freq: Four times a day (QID) | INTRAMUSCULAR | Status: DC | PRN

## 2015-08-26 MED ORDER — ACETAMINOPHEN 325 MG PO TABS
650.0000 mg | ORAL_TABLET | Freq: Four times a day (QID) | ORAL | Status: DC | PRN
Start: 2015-08-26 — End: 2015-08-27

## 2015-08-26 MED ORDER — SODIUM CHLORIDE 0.9 % IV BOLUS (SEPSIS)
1000.0000 mL | Freq: Once | INTRAVENOUS | Status: AC
  Administered 2015-08-26: 1000 mL via INTRAVENOUS

## 2015-08-26 MED ORDER — SODIUM CHLORIDE 0.9 % IV SOLN
10.0000 mL/h | Freq: Once | INTRAVENOUS | Status: DC
  Administered 2015-08-26: 10 mL/h via INTRAVENOUS

## 2015-08-26 MED ORDER — SODIUM CHLORIDE 0.9 % IV SOLN
INTRAVENOUS | Status: DC
  Administered 2015-08-27: 01:00:00 via INTRAVENOUS

## 2015-08-26 MED ORDER — TRAMADOL HCL 50 MG PO TABS
50.0000 mg | ORAL_TABLET | Freq: Four times a day (QID) | ORAL | Status: DC | PRN
  Administered 2015-08-26: 22:00:00 50 mg via ORAL
  Filled 2015-08-26: qty 1

## 2015-08-26 MED ORDER — RALOXIFENE HCL 60 MG PO TABS
60.0000 mg | ORAL_TABLET | Freq: Every day | ORAL | Status: DC
  Administered 2015-08-26: 22:00:00 60 mg via ORAL
  Filled 2015-08-26 (×2): qty 1

## 2015-08-26 MED ORDER — DIATRIZOATE MEGLUMINE & SODIUM 66-10 % PO SOLN: 15.0000 mL | Freq: Once | ORAL | Status: DC

## 2015-08-26 NOTE — ED Notes (Addendum)
Swelling and bruising to left ankle. Pt known blood clot in left leg that was diagnosed yesterday. Pt alert and oriented X4, active, cooperative, pt in NAD. RR even and unlabored, color WNL.

## 2015-08-26 NOTE — Progress Notes (Signed)
ANTICOAGULATION CONSULT NOTE - Initial Consult  Pharmacy Consult for Lovenox  Indication: DVT  Allergies  Allergen Reactions  . Penicillins Hives  . Sulfa Antibiotics Hives    Patient Measurements: Height: 5\' 4"  (162.6 cm) Weight: 147 lb (66.7 kg) IBW/kg (Calculated) : 54.7 Heparin Dosing Weight: 66.7 kg   Vital Signs: Temp: 98.4 F (36.9 C) (08/15 2104) Temp Source: Oral (08/15 2104) BP: 129/80 (08/15 2104) Pulse Rate: 96 (08/15 2104)  Labs:  Recent Labs  08/25/15 1444 08/26/15 1614  HGB  --  7.4*  HCT 22.8* 22.0*  PLT 61* 72*  CREATININE  --  0.75  TROPONINI  --  0.09*    Estimated Creatinine Clearance: 68.5 mL/min (by C-G formula based on SCr of 0.8 mg/dL).   Medical History: Past Medical History:  Diagnosis Date  . Osteoporosis     Medications:  Prescriptions Prior to Admission  Medication Sig Dispense Refill Last Dose  . predniSONE (STERAPRED UNI-PAK 21 TAB) 5 MG (21) TBPK tablet Take 5 mg by mouth See admin instructions. Take as a taper dose 6-5-4-3-2-1  0 08/26/2015 at Unknown time  . raloxifene (EVISTA) 60 MG tablet Take 1 tablet (60 mg total) by mouth daily. 90 tablet 2 08/26/2015 at Unknown time  . traMADol (ULTRAM) 50 MG tablet Take 50 mg by mouth every 6 (six) hours as needed.   0 prn at prn  . colchicine 0.6 MG tablet Take 1 tablet (0.6 mg total) by mouth daily. 30 tablet 0     Assessment: CrCl = 68.5 ml/min No prior anticoag noted.  Goal of Therapy:  resolution of DVT  Monitor platelets by anticoagulation protocol: Yes   Plan:  Lovenox 65 mg SQ Q12H to start 8/15 @ 21:15.   Will monitor CBC daily.   Hadleigh Felber D 08/26/2015,9:13 PM

## 2015-08-26 NOTE — ED Notes (Signed)
Pt in CT.

## 2015-08-26 NOTE — ED Triage Notes (Signed)
Pt states she recently went on a bus trip to [place] and since being home noticed some swelling to left ankle. Pt states she started having pain on Friday. Pt was seen by MD yesterday and had xr and blood work drawn. Pt was told today that her D-Dimer was elevated and she needed to follow up here.

## 2015-08-26 NOTE — H&P (Signed)
[facility] [place] NAME: Robyn Montgomery    MR#:  [id number]  DATE OF BIRTH:  Jun 01, 1955  DATE OF ADMISSION:  08/26/2015  PRIMARY CARE PHYSICIAN: WHITE, Orlene Och, NP   REQUESTING/REFERRING PHYSICIAN: Dr. Gonzella Lex  CHIEF COMPLAINT:   Chief Complaint  Patient presents with  . abnormal labs  . Ankle Pain    HISTORY OF PRESENT ILLNESS:  Robyn Montgomery  is a 60 y.o. female with a known history of Osteoporosis presents to the hospital secondary to left ankle swelling for almost a week now. Patient went on a bus trip to [place] about 2 weeks ago. A week ago she noticed that her left ankle was swollen. Was also slightly bruised and started to hurt her. She had labs done by her podiatrist and was supposed to see him today. Her labs from yesterday showed a WBC count of >120k with anemia and thrombocytopenia which are all new. She was advised to come to the emergency room as her d-dimer was also elevated. Doppler of the lower extremities showed a left distal venous clot which was occlusive, however repeat blood work also showed that to the WBC count was greater than 150 K with hemoglobin of 7 and platelets of 70 K. Denies any active bleeding, no abnormal bruising or weakness. Denies any fevers, chills or night sweats. Family history of breast cancer in mother, patient denies any personal history of cancer. She says other than the left ankle swelling and pain she has been completely in her normal health.  PAST MEDICAL HISTORY:   Past Medical History:  Diagnosis Date  . Osteoporosis     PAST SURGICAL HISTORY:   Past Surgical History:  Procedure Laterality Date  . broken shoulder Right 2012   Plates and screws put in  . MOLE REMOVAL  1977   Removed from left breast  . SHOULDER SURGERY      SOCIAL HISTORY:   Social History  Substance Use Topics  . Smoking status: Never Smoker  . Smokeless tobacco: Never Used  . Alcohol use  0.0 oz/week     Comment: Rare    FAMILY HISTORY:   Family History  Problem Relation Age of Onset  . Arthritis Mother   . Hypertension Mother   . Cancer Mother 40    Breast  . Osteoporosis Mother   . Breast cancer Mother 69  . Parkinsonism Father   . Heart disease Maternal Uncle   . Cancer Paternal Grandfather     Oral    DRUG ALLERGIES:   Allergies  Allergen Reactions  . Penicillins Hives  . Sulfa Antibiotics Hives    REVIEW OF SYSTEMS:   Review of Systems  Constitutional: Negative for chills, fever, malaise/fatigue and weight loss.  HENT: Negative for ear discharge, ear pain, hearing loss, nosebleeds and tinnitus.   Eyes: Negative for blurred vision, double vision and photophobia.  Respiratory: Negative for cough, hemoptysis, shortness of breath and wheezing.   Cardiovascular: Positive for leg swelling. Negative for chest pain, palpitations and orthopnea.  Gastrointestinal: Negative for abdominal pain, constipation, diarrhea, heartburn, melena, nausea and vomiting.  Genitourinary: Negative for dysuria, frequency, hematuria and urgency.  Musculoskeletal: Positive for joint pain. Negative for back pain, myalgias and neck pain.  Skin: Negative for rash.  Neurological: Negative for dizziness, tingling, tremors, sensory change, speech change, focal weakness and headaches.  Endo/Heme/Allergies: Does not bruise/bleed easily.  Psychiatric/Behavioral: Negative for depression.    MEDICATIONS AT HOME:  Prior to Admission medications   Medication Sig Start Date End Date Taking? Authorizing Provider  predniSONE (STERAPRED UNI-PAK 21 TAB) 5 MG (21) TBPK tablet Take 5 mg by mouth See admin instructions. Take as a taper dose 6-5-4-3-2-1 08/24/15  Yes Historical Provider, MD  raloxifene (EVISTA) 60 MG tablet Take 1 tablet (60 mg total) by mouth daily. 01/20/15  Yes Rubbie Battiest, NP  traMADol (ULTRAM) 50 MG tablet Take 50 mg by mouth every 6 (six) hours as needed.  08/24/15  Yes  Historical Provider, MD  colchicine 0.6 MG tablet Take 1 tablet (0.6 mg total) by mouth daily. 08/25/15   Max T Hyatt, DPM      VITAL SIGNS:  Blood pressure 123/78, pulse (!) 104, temperature 97.9 F (36.6 C), temperature source Oral, resp. rate (!) 22, height 5' 4" (1.626 m), weight 66.7 kg (147 lb), SpO2 97 %.  PHYSICAL EXAMINATION:   Physical Exam  GENERAL:  60 y.o.-year-old patient lying in the bed with no acute distress.  EYES: Pupils equal, round, reactive to light and accommodation. No scleral icterus. Extraocular muscles intact.  HEENT: Head atraumatic, normocephalic. Oropharynx and nasopharynx clear.  NECK:  Supple, no jugular venous distention. No thyroid enlargement, no tenderness. NO PALPABLE LYMPHADENOPATHY LUNGS: Normal breath sounds bilaterally, no wheezing, rales,rhonchi or crepitation. No use of accessory muscles of respiration.  CARDIOVASCULAR: S1, S2 normal. No murmurs, rubs, or gallops.  ABDOMEN: Soft, nontender, nondistended. Bowel sounds present. No organomegaly or mass.  EXTREMITIES: No pedal edema except for left ankle medial swelling and mild bruising, cyanosis, or clubbing.  NEUROLOGIC: Cranial nerves II through XII are intact. Muscle strength 5/5 in all extremities. Sensation intact. Gait not checked.  PSYCHIATRIC: The patient is alert and oriented x 3.  SKIN: No obvious rash, lesion, or ulcer.   LABORATORY PANEL:   CBC  Recent Labs Lab 08/26/15 1614  WBC 158.3*  HGB 7.4*  HCT 22.0*  PLT 72*   ------------------------------------------------------------------------------------------------------------------  Chemistries   Recent Labs Lab 08/26/15 1614  NA 135  K 3.5  CL 99*  CO2 28  GLUCOSE 118*  BUN 15  CREATININE 0.75  CALCIUM 9.0   ------------------------------------------------------------------------------------------------------------------  Cardiac Enzymes  Recent Labs Lab 08/26/15 1614  TROPONINI 0.09*    ------------------------------------------------------------------------------------------------------------------  RADIOLOGY:  Ct Angio Chest Pe W Or Wo Contrast  Result Date: 08/26/2015 CLINICAL DATA:  Deep venous thrombosis on ultrasound today. No short of breath or chest pain EXAM: CT ANGIOGRAPHY CHEST WITH CONTRAST TECHNIQUE: Multidetector CT imaging of the chest was performed using the standard protocol during bolus administration of intravenous contrast. Multiplanar CT image reconstructions and MIPs were obtained to evaluate the vascular anatomy. CONTRAST:  75 mL Isovue 370 IV COMPARISON:  Left lower extremity ultrasound today. FINDINGS: Negative for pulmonary embolism. Mild pulmonary artery enlargement bilaterally suggesting pulmonary artery hypertension. Negative for aortic aneurysm or dissection. Normal heart size. No pericardial effusion. Linear density in the left lower lobe laterally most consistent with scar or atelectasis. Negative for pneumonia or effusion. Negative for mass or adenopathy. Several low-density lesions in the liver. The largest lesion at the anterior right lobe measures approximately 2.6 x 5.1 cm but is incompletely evaluated. Immediately adjacent to this is a 3 cm cyst. Additional small indeterminate 1 cm lesions in the right and left lobe of the liver. No acute skeletal abnormality.  Right shoulder surgery. Review of the MIP images confirms the above findings. IMPRESSION: Negative for pulmonary emboli. Mild scar or atelectasis in the left lower  lobe. No acute abnormality in the chest Multiple low-density lesions in the liver are indeterminate. One of the lesions in the anterior liver is incompletely evaluated and concerning for tumor. CT abdomen pelvis with contrast suggested for further evaluation. It would be best to do this on a separate day as the patient received intravenous contrast which could obscure the findings. Electronically Signed   By: Franchot Gallo M.D.   On:  08/26/2015 18:05   US Venous Img Lower Unilateral Left  Result Date: 08/26/2015 CLINICAL DATA:  Left lower extremity pain and swelling for the past 5 days. Recent bus ride [place] week and half ago. History of skin cancer. Evaluate for DVT. EXAM: LEFT LOWER EXTREMITY VENOUS DOPPLER ULTRASOUND TECHNIQUE: Gray-scale sonography with graded compression, as well as color Doppler and duplex ultrasound were performed to evaluate the lower extremity deep venous systems from the level of the common femoral vein and including the common femoral, femoral, profunda femoral, popliteal and calf veins including the posterior tibial, peroneal and gastrocnemius veins when visible. The superficial great saphenous vein was also interrogated. Spectral Doppler was utilized to evaluate flow at rest and with distal augmentation maneuvers in the common femoral, femoral and popliteal veins. COMPARISON:  None. FINDINGS: Contralateral Common Femoral Vein: Respiratory phasicity is normal and symmetric with the symptomatic side. No evidence of thrombus. Normal compressibility. Common Femoral Vein: No evidence of thrombus. Normal compressibility, respiratory phasicity and response to augmentation. Saphenofemoral Junction: No evidence of thrombus. Normal compressibility and flow on color Doppler imaging. Profunda Femoral Vein: No evidence of thrombus. Normal compressibility and flow on color Doppler imaging. Femoral Vein: No evidence of thrombus. Normal compressibility, respiratory phasicity and response to augmentation. Popliteal Vein: No evidence of thrombus. Normal compressibility, respiratory phasicity and response to augmentation. Calf Veins: One of the duplicated posterior tibial veins contains expansile mixed echogenic occlusive thrombus (representative image 34 through 37). The additional posterior tibial vein as well as the peroneal veins appear patent where imaged. Superficial Great Saphenous Vein: No evidence of thrombus. Normal  compressibility and flow on color Doppler imaging. Venous Reflux:  None. Other Findings:  None. IMPRESSION: The examination is positive for age-indeterminate occlusive DVT within one of the paired posterior tibial calf veins. There is no extension of this distal calf DVT to the more central aspect the left lower extremity venous system. Electronically Signed   By: Sandi Mariscal M.D.   On: 08/26/2015 14:57    EKG:   Orders placed or performed during the hospital encounter of 08/26/15  . ED EKG  . ED EKG  . EKG 12-Lead  . EKG 12-Lead  . EKG 12-Lead  . EKG 12-Lead    IMPRESSION AND PLAN:   Robyn Montgomery  is a 60 y.o. female with a known history of Osteoporosis presents to the hospital secondary to left ankle swelling for almost a week now. Noted to have increased wbc, anemia and thrombocytopenia.  #1 Leukocytosis with anemia and thrombocytopenia- most likely leukemia (?CLL). Differential did not show any blasts. -Oncology consulted. Flow cytometry requested -Other tumor markers have been ordered. -Might need bone marrow biopsy   #2 Acute DVT- awaiting to hear from oncology about anticoagulation, with her anemia and thrombocytopenia - vascular consult for possible IVC filter - no proximal DVT noted, CT chest negative for PE  #3 Anemia and thrombocytopenia- likely as mentioned above- 1 unit Tx ordered for now of PRBC  #4 Liver lesion- CT abd and pelvis tomorrow - tumor markers pending  #5 DVT  prophylaxis- pending at this time   All the records are reviewed and case discussed with ED provider. Management plans discussed with the patient, family and they are in agreement.  CODE STATUS: Full Code  TOTAL TIME TAKING CARE OF THIS PATIENT: 50 minutes.    [name] M.D on 08/26/2015 at 7:49 PM  Between 7am to 6pm - Pager - [phone]  After 6pm go to [email or web address] - Proofreader  [facility]  Office  [phone]  CC: Primary care physician;  WHITE, Orlene Och, NP

## 2015-08-26 NOTE — ED Notes (Signed)
Report to Allison, RN

## 2015-08-26 NOTE — Telephone Encounter (Addendum)
Dr. Jacqualyn Posey states lab called with CRITICAL Values of pt's labs ordered yesterday: WBC 127.8, RBC 2, Platelets 61, d dimer greater than 20 (normal 0.5), does Dr. Milinda Pointer want pt to go to PCP, ER or repeat labs. Dr. Milinda Pointer reviewed labs called in by Dr. Jacqualyn Posey, orders pt to go to ER may have a clot. Left message on home phone informing pt that I needed her to call concerning abnormal labs that needed urgent treatment. I spoke with pt's Husband, Eddie Dibbles and he said pt was at home resting her foot, to try again she may be in the shower or sleeping. Left another message on home phone to call [facility] asap. Pt called and I directed her to the ER, instructing her to tell staff the d-dimer test and labs were in EPIC, and the d-dimer was an indicator that she may have a clot.  Pt states she will go and have everything taken care of. Pt left message 1:43pm stating she was on the way to the ER.

## 2015-08-26 NOTE — ED Provider Notes (Signed)
[facility] Emergency Department Provider Note  ____________________________________________  Time seen: Approximately 4:22 PM  I have reviewed the triage vital signs and the nursing notes.   HISTORY  Chief Complaint abnormal labs and Ankle Pain   HPI Robyn Montgomery is a 60 y.o. female history of osteoporosis who presents for evaluation of left ankle swelling and pain. Patient reports that 2 weeks ago she went to [place] on a bus trip. A few days after getting there she noticed swelling of her ankle. She denies any trauma to her leg. The swelling got progressively worse and so did the pain. Patient reports that the pain is mild at rest however moderate with walking and weightbearing and has been constant over the last few days, throbbing sharp in quality, located in her L ankle non radiating. She went to work the clinic earlier this week with an x-ray that was negative for fracture. A d-dimer was sent which was positive and patient was told to come to the emergency department to rule out DVT. Patient denies any history of cancer, personal or family history of PE or DVT, recent immobilization, chest pain, SOB, exogenous hormones.  History reviewed. No pertinent past medical history.  Patient Active Problem List   Diagnosis Date Noted  . Gout 08/25/2015  . Encounter for routine gynecological examination 08/22/2014  . Routine general medical examination at a health care facility 07/27/2014  . Encounter to establish care 07/27/2014  . Osteoporosis 07/27/2014    Past Surgical History:  Procedure Laterality Date  . broken shoulder Right 2012   Plates and screws put in  . MOLE REMOVAL  1977   Removed from left breast  . SHOULDER SURGERY      Prior to Admission medications   Medication Sig Start Date End Date Taking? Authorizing Provider  colchicine 0.6 MG tablet Take 1 tablet (0.6 mg total) by mouth daily. 08/25/15   Max T Hyatt, DPM  predniSONE (STERAPRED  UNI-PAK 21 TAB) 5 MG (21) TBPK tablet  08/24/15   Historical Provider, MD  raloxifene (EVISTA) 60 MG tablet Take 1 tablet (60 mg total) by mouth daily. 01/20/15   Rubbie Battiest, NP  traMADol Veatrice Bourbon) 50 MG tablet  08/24/15   Historical Provider, MD    Allergies Penicillins and Sulfa antibiotics  Family History  Problem Relation Age of Onset  . Arthritis Mother   . Hypertension Mother   . Cancer Mother 37    Breast  . Osteoporosis Mother   . Breast cancer Mother 65  . Parkinsonism Father   . Heart disease Maternal Uncle   . Cancer Paternal Grandfather     Oral    Social History Social History  Substance Use Topics  . Smoking status: Never Smoker  . Smokeless tobacco: Never Used  . Alcohol use 0.0 oz/week     Comment: Rare    Review of Systems  Constitutional: Negative for fever. Eyes: Negative for visual changes. ENT: Negative for sore throat. Cardiovascular: Negative for chest pain. Respiratory: Negative for shortness of breath. Gastrointestinal: Negative for abdominal pain, vomiting or diarrhea. Genitourinary: Negative for dysuria. Musculoskeletal: Negative for back pain. + Left ankle pain Skin: Negative for rash. Neurological: Negative for headaches, weakness or numbness.  ____________________________________________   PHYSICAL EXAM:  VITAL SIGNS: ED Triage Vitals  Enc Vitals Group     BP 08/26/15 1359 133/78     Pulse Rate 08/26/15 1359 (!) 112     Resp 08/26/15 1359 18  Temp 08/26/15 1359 97.9 F (36.6 C)     Temp Source 08/26/15 1359 Oral     SpO2 08/26/15 1359 96 %     Weight 08/26/15 1359 147 lb (66.7 kg)     Height 08/26/15 1359 5\' 4"  (1.626 m)     Head Circumference --      Peak Flow --      Pain Score 08/26/15 1401 0     Pain Loc --      Pain Edu? --      Excl. in [place]? --     Constitutional: Alert and oriented. Well appearing and in no apparent distress. HEENT:      Head: Normocephalic and atraumatic.         Eyes: Conjunctivae are  normal. Sclera is non-icteric. EOMI. PERRL      Mouth/Throat: Mucous membranes are moist.       Neck: Supple with no signs of meningismus. Cardiovascular: Tachycardic with regular rhythm. No murmurs, gallops, or rubs. 2+ symmetrical distal pulses are present in all extremities. No JVD. Respiratory: Normal respiratory effort. Lungs are clear to auscultation bilaterally. No wheezes, crackles, or rhonchi.  Gastrointestinal: Soft, non tender, and non distended with positive bowel sounds. No rebound or guarding. Genitourinary: No CVA tenderness. Musculoskeletal: Patient has bruising and swelling surrounding the medial malleolus of the left ankle, mild swollen calf on the left when compared to the right strong distal pulses. Nontender with normal range of motion in all extremities.  Neurologic: Normal speech and language. Face is symmetric. Moving all extremities. No gross focal neurologic deficits are appreciated. Skin: Skin is warm, dry and intact. No rash noted. Psychiatric: Mood and affect are normal. Speech and behavior are normal.  ____________________________________________   LABS (all labs ordered are listed, but only abnormal results are displayed)  Labs Reviewed  CBC - Abnormal; Notable for the following:       Result Value   WBC 158.3 (*)    RBC 2.44 (*)    Hemoglobin 7.4 (*)    HCT 22.0 (*)    RDW 19.8 (*)    Platelets 72 (*)    All other components within normal limits  BASIC METABOLIC PANEL - Abnormal; Notable for the following:    Chloride 99 (*)    Glucose, Bld 118 (*)    All other components within normal limits  TROPONIN I - Abnormal; Notable for the following:    Troponin I 0.09 (*)    All other components within normal limits  BRAIN NATRIURETIC PEPTIDE  DIFFERENTIAL  CEA  CA 19-9 (SERIAL)  CANCER ANTIGEN 27.29  CA 125  COMP PANEL: LEUKEMIA/LYMPHOMA  TYPE AND SCREEN  PREPARE RBC (CROSSMATCH)   ____________________________________________  EKG  ED ECG  REPORT I, Rudene Re, the attending physician, personally viewed and interpreted this ECG.  Normal sinus rhythm, rate of 91, normal intervals, normal axis, no ST elevations or depressions. ____________________________________________  RADIOLOGY  DVT: The examination is positive for age-indeterminate occlusive DVT within one of the paired posterior tibial calf veins. There is no extension of this distal calf DVT to the more central aspect the left lower extremity venous system.  CTA PE: Negative for pulmonary emboli.  Mild scar or atelectasis in the left lower lobe. No acute abnormality in the chest  Multiple low-density lesions in the liver are indeterminate. One of the lesions in the anterior liver is incompletely evaluated and concerning for tumor. CT abdomen pelvis with contrast suggested for further evaluation. It would  be best to do this on a separate day as the patient received intravenous contrast which could obscure the findings. ____________________________________________   PROCEDURES  Procedure(s) performed: None Procedures Critical Care performed:  None ____________________________________________   INITIAL IMPRESSION / ASSESSMENT AND PLAN / ED COURSE  60 year old otherwise healthy female who presents for evaluation of left lower extremity swelling and pain after recent longer bus trip to [place]. DVT positive for age indeterminant DVT on the distal calf. Patient is mildly tachycardic with heart rate in the 110s but denies chest pain or shortness of breath. We'll get a CTA to rule out PE and if that's negative will discharge patient home on [place] toe. Patient has close follow-up with primary care doctor in 6 days.  Clinical Course  Comment By Time  CT scan with no evidence of PE. However sadly patient found to be thrombocytopenic, anemic, and with leukocytosis in the 150K concerning for malignancy. CT did show lesions in her liver. Patient has been  updated about these findings. Differential added to prior CBC. Oncology paged.  Rudene Re, MD 08/15 [phone]  Spoke with Dr. Grayland Ormond, medical oncology who will look at patient's smear in the lab and call me back with recs. Rudene Re, MD 08/15 1821  Dr Grayland Ormond looked at the smear and thinks this is CLL. CT also showing this liver mass which he thinks is unrelated and possibly a different type of malignancy. Cancer markers were sent. He recommended transfusing patient 1 unit of PRBCs. I discussed with the hospitalist will admit patient. Rudene Re, MD 08/15 1901    Pertinent labs & imaging results that were available during my care of the patient were reviewed by me and considered in my medical decision making (see chart for details).    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    Rudene Re, MD 08/26/15 [phone]

## 2015-08-26 NOTE — Progress Notes (Signed)
Discussed with Dr. Grayland Ormond- will start lovenox for DVT treatment and f/u labs tomorrow

## 2015-08-27 ENCOUNTER — Inpatient Hospital Stay: Payer: BLUE CROSS/BLUE SHIELD

## 2015-08-27 ENCOUNTER — Ambulatory Visit (HOSPITAL_COMMUNITY)
Admission: AD | Admit: 2015-08-27 | Discharge: 2015-08-27 | Disposition: A | Discharge status: Home / Self Care | Payer: BLUE CROSS/BLUE SHIELD | Source: Other Acute Inpatient Hospital | Attending: Internal Medicine | Admitting: Internal Medicine

## 2015-08-27 DIAGNOSIS — I82402 Acute embolism and thrombosis of unspecified deep veins of left lower extremity: Secondary | ICD-10-CM | POA: Insufficient documentation

## 2015-08-27 DIAGNOSIS — C9590 Leukemia, unspecified not having achieved remission: Secondary | ICD-10-CM | POA: Insufficient documentation

## 2015-08-27 LAB — PROTIME-INR
INR: 1.17
PROTHROMBIN TIME: 15 s (ref 11.4–15.2)

## 2015-08-27 LAB — C-REACTIVE PROTEIN: CRP: 38.4 mg/L — AB (ref 0.0–4.9)

## 2015-08-27 LAB — TSH: TSH: 0.474 u[IU]/mL (ref 0.350–4.500)

## 2015-08-27 LAB — CBC WITH DIFFERENTIAL/PLATELET
BASOS ABS: 0 10*3/uL (ref 0.0–0.2)
Basos: 0 %
EOS (ABSOLUTE): 0 10*3/uL (ref 0.0–0.4)
Eos: 0 %
HEMATOCRIT: 22.8 % — AB (ref 34.0–46.6)
Hemoglobin: 7.1 g/dL — ABNORMAL LOW (ref 11.1–15.9)
LYMPHS: 3 %
Lymphocytes Absolute: 2.6 10*3/uL (ref 0.7–3.1)
MCH: 29.5 pg (ref 26.6–33.0)
MCHC: 31.1 g/dL — AB (ref 31.5–35.7)
MCV: 95 fL (ref 79–97)
MONOCYTES: 2 %
Monocytes Absolute: 1.7 10*3/uL — ABNORMAL HIGH (ref 0.1–0.9)
NEUTROS PCT: 1 %
NRBC: 1 % — AB (ref 0–0)
Neutrophils Absolute: 0.9 10*3/uL — ABNORMAL LOW (ref 1.4–7.0)
PLATELETS: 61 10*3/uL — AB (ref 150–379)
RBC: 2.41 x10E6/uL — AB (ref 3.77–5.28)
RDW: 19.4 % — AB (ref 12.3–15.4)
WBC: 127.8 10*3/uL — AB (ref 3.4–10.8)

## 2015-08-27 LAB — CBC
HEMATOCRIT: 24.2 % — AB (ref 35.0–47.0)
Hemoglobin: 7.8 g/dL — ABNORMAL LOW (ref 12.0–16.0)
MCH: 29.1 pg (ref 26.0–34.0)
MCHC: 32 g/dL (ref 32.0–36.0)
MCV: 91 fL (ref 80.0–100.0)
Platelets: 56 10*3/uL — ABNORMAL LOW (ref 150–440)
RBC: 2.66 MIL/uL — ABNORMAL LOW (ref 3.80–5.20)
RDW: 18.7 % — AB (ref 11.5–14.5)
WBC: 132.7 10*3/uL (ref 3.6–11.0)

## 2015-08-27 LAB — BASIC METABOLIC PANEL
Anion gap: 7 (ref 5–15)
BUN: 13 mg/dL (ref 6–20)
CO2: 27 mmol/L (ref 22–32)
CREATININE: 0.61 mg/dL (ref 0.44–1.00)
Calcium: 8.1 mg/dL — ABNORMAL LOW (ref 8.9–10.3)
Chloride: 105 mmol/L (ref 101–111)
GFR calc Af Amer: >60 mL/min (ref >60)
GLUCOSE: 105 mg/dL — AB (ref 65–99)
POTASSIUM: 4.2 mmol/L (ref 3.5–5.1)
Sodium: 139 mmol/L (ref 135–145)

## 2015-08-27 LAB — IMMATURE CELLS: Blasts/blast like cells: 110 % — ABNORMAL HIGH (ref 0–0)

## 2015-08-27 LAB — HEPATIC FUNCTION PANEL
ALBUMIN: 3.1 g/dL — AB (ref 3.5–5.0)
ALT: 30 U/L (ref 14–54)
AST: 29 U/L (ref 15–41)
Alkaline Phosphatase: 122 U/L (ref 38–126)
Bilirubin, Direct: <0.1 mg/dL — ABNORMAL LOW (ref 0.1–0.5)
Total Bilirubin: 0.5 mg/dL (ref 0.3–1.2)
Total Protein: 6.6 g/dL (ref 6.5–8.1)

## 2015-08-27 LAB — URIC ACID: Uric Acid: 4 mg/dL (ref 2.5–7.1)

## 2015-08-27 LAB — SEDIMENTATION RATE: Sed Rate: 14 mm/hr (ref 0–40)

## 2015-08-27 LAB — RHEUMATOID FACTOR: RHEUMATOID FACTOR: 16.7 [IU]/mL — AB (ref 0.0–13.9)

## 2015-08-27 LAB — D-DIMER, QUANTITATIVE (NOT AT ARMC)

## 2015-08-27 LAB — ANA: Anti Nuclear Antibody(ANA): NEGATIVE

## 2015-08-27 MED ORDER — IOPAMIDOL (ISOVUE-370) INJECTION 76%
100.0000 mL | Freq: Once | INTRAVENOUS | Status: AC | PRN
  Administered 2015-08-27: 100 mL via INTRAVENOUS

## 2015-08-27 MED ORDER — IOPAMIDOL (ISOVUE-300) INJECTION 61%: 100.0000 mL | Freq: Once | INTRAVENOUS | Status: DC | PRN

## 2015-08-27 MED ORDER — DIATRIZOATE MEGLUMINE & SODIUM 66-10 % PO SOLN
15.0000 mL | ORAL | Status: AC
  Administered 2015-08-27 (×2): 15 mL via ORAL

## 2015-08-27 NOTE — Discharge Summary (Signed)
Robyn Montgomery, 60 y.o., DOB February 08, 1955, MRN [id number]. 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 She had blood work done which showed greater than 120,000 wbc's with anemia and thrombocytopenia. On the peripheral smear and no blasts were noted. Patient was referred to ED for further evaluation she had a CBC which again showed elevated WBC count thrombocytopenia and anemia. Admitting physician spoke to Dr. Grayland Ormond who recommended blood transfusion as well as treatment for Lovenox. On further review was noted that patient had 91% blasts.  Her discussed the case with the pathologist today and there is concern for acute myelogenous leukemia. Therefore he recommended tertiary care center .   going touch with [facility] and the patient is accepted there.          Consults  hemetelogy  Significant Tests:  See full reports for all details     Ct Angio Chest Pe W Or Wo Contrast  Result Date: 08/26/2015 CLINICAL DATA:  Deep venous thrombosis on ultrasound today. No short of breath or chest pain EXAM: CT ANGIOGRAPHY CHEST WITH CONTRAST TECHNIQUE: Multidetector CT imaging of the chest was performed using the standard protocol during bolus administration of intravenous contrast. Multiplanar CT image reconstructions and MIPs were obtained to evaluate the vascular anatomy.  CONTRAST:  75 mL Isovue 370 IV COMPARISON:  Left lower extremity ultrasound today. FINDINGS: Negative for pulmonary embolism. Mild pulmonary artery enlargement bilaterally suggesting pulmonary artery hypertension. Negative for aortic aneurysm or dissection. Normal heart size. No pericardial effusion. Linear density in the left lower lobe laterally most consistent with scar or atelectasis. Negative for pneumonia or effusion. Negative for mass or adenopathy. Several low-density lesions in the liver. The largest lesion at the anterior right lobe measures approximately 2.6 x 5.1 cm but is incompletely evaluated. Immediately adjacent to this is a 3 cm cyst. Additional small indeterminate 1 cm lesions in the right and left lobe of the liver. No acute skeletal abnormality.  Right shoulder surgery. Review of the MIP images confirms the above findings. IMPRESSION: Negative for pulmonary emboli. Mild scar or atelectasis in the left lower lobe. No acute abnormality in the chest Multiple low-density lesions in the liver are indeterminate. One of the lesions in the anterior liver is incompletely evaluated and concerning for tumor. CT abdomen pelvis with contrast suggested for further evaluation. It would be best to do this on a separate day as the patient received intravenous contrast which could obscure the findings. Electronically Signed   By: Robyn Montgomery M.D.   On: 08/26/2015 18:05   Ct Abdomen Pelvis W Contrast  Result Date: 08/27/2015 CLINICAL DATA:  DVT. Recent diagnosis of leukemia. Evaluate liver mass. EXAM: CT ABDOMEN AND PELVIS WITH CONTRAST TECHNIQUE: Multidetector CT imaging of the abdomen and pelvis was performed using the standard protocol following bolus administration of intravenous contrast. CONTRAST:  100 cc Isovue 370 COMPARISON:  Chest CT yesterday. FINDINGS: Lower chest:  Linear scarring at  the bases. Hepatobiliary: There are multiple well-circumscribed low densities within the liver consistent with  benign cysts. Most of these are 1 cm or smaller and scattered throughout the liver. There are 2 adjacent larger cysts at the junction of the right and left lobes, the upper component measuring 2.9 cm in diameter in the lower component measuring 4.8 cm in diameter. Along the lateral margin of the posterior segment of the right lobe, image 34, there is what likely represents a small hemangioma. There are no worrisome liver lesions. No calcified gallstones. Some excreted contrast in the gallbladder from yesterday's study. No biliary ductal dilatation. Pancreas: Normal Spleen: 2 small cysts at the anterior upper corner, not significant. Otherwise normal. Adrenals/Urinary Tract: Adrenal glands are normal. Kidneys are normal. No bladder lesion. Stomach/Bowel: No bowel lesion evident. Vascular/Lymphatic: Aortic atherosclerosis. No aneurysm. No retroperitoneal mass or lymphadenopathy. Reproductive: Postmenopausal uterus with at least 1 leiomyoma. No adnexal lesion. Other: None Musculoskeletal: Chronic degenerative changes in the lower lumbar spine. IMPRESSION: Low-density areas in the liver are consistent with benign cysts. Probable 1 cm hemangioma along the lateral margin of the posterior segment of the right lobe caudally. No further follow-up suggested. Electronically Signed   By: Nelson Chimes M.D.   On: 08/27/2015 13:48   US Venous Img Lower Unilateral Left  Result Date: 08/26/2015 CLINICAL DATA:  Left lower extremity pain and swelling for the past 5 days. Recent bus ride [place] week and half ago. History of skin cancer. Evaluate for DVT. EXAM: LEFT LOWER EXTREMITY VENOUS DOPPLER ULTRASOUND TECHNIQUE: Gray-scale sonography with graded compression, as well as color Doppler and duplex ultrasound were performed to evaluate the lower extremity deep venous systems from the level of the common femoral vein and including the common femoral, femoral, profunda femoral, popliteal and calf veins including the posterior  tibial, peroneal and gastrocnemius veins when visible. The superficial great saphenous vein was also interrogated. Spectral Doppler was utilized to evaluate flow at rest and with distal augmentation maneuvers in the common femoral, femoral and popliteal veins. COMPARISON:  None. FINDINGS: Contralateral Common Femoral Vein: Respiratory phasicity is normal and symmetric with the symptomatic side. No evidence of thrombus. Normal compressibility. Common Femoral Vein: No evidence of thrombus. Normal compressibility, respiratory phasicity and response to augmentation. Saphenofemoral Junction: No evidence of thrombus. Normal compressibility and flow on color Doppler imaging. Profunda Femoral Vein: No evidence of thrombus. Normal compressibility and flow on color Doppler imaging. Femoral Vein: No evidence of thrombus. Normal compressibility, respiratory phasicity and response to augmentation. Popliteal Vein: No evidence of thrombus. Normal compressibility, respiratory phasicity and response to augmentation. Calf Veins: One of the duplicated posterior tibial veins contains expansile mixed echogenic occlusive thrombus (representative image 34 through 37). The additional posterior tibial vein as well as the peroneal veins appear patent where imaged. Superficial Great Saphenous Vein: No evidence of thrombus. Normal compressibility and flow on color Doppler imaging. Venous Reflux:  None. Other Findings:  None. IMPRESSION: The examination is positive for age-indeterminate occlusive DVT within one of the paired posterior tibial calf veins. There is no extension of this distal calf DVT to the more central aspect the left lower extremity venous system. Electronically Signed   By: Sandi Mariscal M.D.   On: 08/26/2015 14:57       Today   Subjective:   [place] well denies no chest pain  Objective:   Blood pressure 124/73, pulse (!) 105, temperature 98.9 F (37.2 C), temperature source Oral, resp. rate 18, height  5\' 4"  (1.626 m), weight 67 kg (147 lb 11.2 oz), SpO2 96 %.  .  Intake/Output Summary (Last 24 hours) at 08/27/15 1419 Last data filed at 08/27/15 1200  Gross per 24 hour  Intake             1680 ml  Output              550 ml  Net             1130 ml    Exam VITAL SIGNS: Blood pressure 124/73, pulse (!) 105, temperature 98.9 F (37.2 C), temperature source Oral, resp. rate 18, height 5\' 4"  (1.626 m), weight 67 kg (147 lb 11.2 oz), SpO2 96 %.  GENERAL:  60 y.o.-year-old patient lying in the bed with no acute distress.  EYES: Pupils equal, round, reactive to light and accommodation. No scleral icterus. Extraocular muscles intact.  HEENT: Head atraumatic, normocephalic. Oropharynx and nasopharynx clear.  NECK:  Supple, no jugular venous distention. No thyroid enlargement, no tenderness.  LUNGS: Normal breath sounds bilaterally, no wheezing, rales,rhonchi or crepitation. No use of accessory muscles of respiration.  CARDIOVASCULAR: S1, S2 normal. No murmurs, rubs, or gallops.  ABDOMEN: Soft, nontender, nondistended. Bowel sounds present. No organomegaly or mass.  EXTREMITIES: No pedal edema, cyanosis, or clubbing.  NEUROLOGIC: Cranial nerves II through XII are intact. Muscle strength 5/5 in all extremities. Sensation intact. Gait not checked.  PSYCHIATRIC: The patient is alert and oriented x 3.  SKIN: No obvious rash, lesion, or ulcer.   Data Review     CBC w Diff:  Lab Results  Component Value Date   WBC 132.7 (HH) 08/27/2015   HGB 7.8 (L) 08/27/2015   HCT 24.2 (L) 08/27/2015   HCT 22.8 (L) 08/25/2015   PLT 56 (L) 08/27/2015   PLT 61 (LL) 08/25/2015   LYMPHOPCT 8 08/26/2015   BANDSPCT 0 08/26/2015   MONOPCT 1 08/26/2015   EOSPCT 0 08/26/2015   BASOPCT 0 08/26/2015   CMP:  Lab Results  Component Value Date   NA 139 08/27/2015   NA 142 06/07/2013   K 4.2 08/27/2015   CL 105 08/27/2015   CO2 27 08/27/2015   BUN 13 08/27/2015   BUN 15 06/07/2013   CREATININE 0.61  08/27/2015   GLU 89 06/07/2013   PROT 6.9 08/20/2014   ALBUMIN 4.3 08/20/2014   BILITOT 0.4 08/20/2014   ALKPHOS 75 08/20/2014   AST 14 08/20/2014   ALT 13 08/20/2014  .  Micro Results Recent Results (from the past 240 hour(s))  Rapid strep screen     Status: None   Collection Time: 08/21/15 10:09 AM  Result Value Ref Range Status   Streptococcus, Group A Screen (Direct) NEGATIVE NEGATIVE Final    Comment: (NOTE) A Rapid Antigen test may result negative if the antigen level in the sample is below the detection level of this test. The FDA has not cleared this test as a stand-alone test therefore the rapid antigen negative result has reflexed to a Group A Strep culture.   Culture, group A strep     Status: None   Collection Time: 08/21/15 10:09 AM  Result Value Ref Range Status   Specimen Description THROAT  Final   Special Requests NONE Reflexed from [id number]  Final   Culture   Final    NO GROUP A STREP (S.PYOGENES) ISOLATED Performed at [facility]    Report Status 08/24/2015 FINAL  Final  Code Status Orders        Start     Ordered   08/26/15 2111  Full code  Continuous     08/26/15 2110    Code Status History    Date Active Date Inactive Code Status Order ID Comments User Context   This patient has a current code status but no historical code status.    Advance Directive Documentation   Flowsheet Row Most Recent Value  Type of Advance Directive  Healthcare Power of Attorney  Pre-existing out of facility DNR order (yellow form or pink MOST form)  No data  "MOST" Form in Place?  No data            Discharge Medications     Medication List    STOP taking these medications   colchicine 0.6 MG tablet   predniSONE 5 MG (21) Tbpk tablet Commonly known as:  STERAPRED UNI-PAK 21 TAB   raloxifene 60 MG tablet Commonly known as:  EVISTA     TAKE these medications   traMADol 50 MG tablet Commonly known as:  ULTRAM Take 50 mg by mouth  every 6 (six) hours as needed.          Total Time in preparing paper work, data evaluation and todays exam - 130 minutes minutes spent discussing case with hemetology at [facility] and Dianah Field M.D on 08/27/2015 at 2:19 PM  [facility]   Office  [phone]

## 2015-08-28 ENCOUNTER — Encounter: Payer: No Typology Code available for payment source | Admitting: Nurse Practitioner

## 2015-08-28 LAB — TYPE AND SCREEN
ABO/RH(D): O POS
Antibody Screen: NEGATIVE
UNIT DIVISION: 0
UNIT DIVISION: 0

## 2015-08-28 LAB — CA 125: CA 125: 15.9 U/mL (ref 0.0–38.1)

## 2015-08-28 LAB — CEA: CEA: 1.1 ng/mL (ref 0.0–4.7)

## 2015-08-28 LAB — CANCER ANTIGEN 27.29: CA 27.29: 35.5 U/mL (ref 0.0–38.6)

## 2015-08-28 LAB — PREPARE RBC (CROSSMATCH)

## 2015-08-28 LAB — CA 19-9 (SERIAL): CA 19 9: 1 U/mL (ref 0–35)

## 2015-09-03 ENCOUNTER — Ambulatory Visit: Payer: BLUE CROSS/BLUE SHIELD | Admitting: Podiatry

## 2015-11-28 DIAGNOSIS — I509 Heart failure, unspecified: Secondary | ICD-10-CM | POA: Insufficient documentation

## 2016-02-02 ENCOUNTER — Encounter: Payer: Self-pay | Admitting: Family

## 2016-02-02 ENCOUNTER — Ambulatory Visit (INDEPENDENT_AMBULATORY_CARE_PROVIDER_SITE_OTHER): Payer: BLUE CROSS/BLUE SHIELD | Admitting: Family

## 2016-02-02 VITALS — BP 124/86 | HR 73 | Temp 97.8°F | Ht 64.0 in | Wt 140.4 lb

## 2016-02-02 DIAGNOSIS — C9201 Acute myeloblastic leukemia, in remission: Secondary | ICD-10-CM

## 2016-02-02 DIAGNOSIS — I429 Cardiomyopathy, unspecified: Secondary | ICD-10-CM

## 2016-02-02 DIAGNOSIS — Z1231 Encounter for screening mammogram for malignant neoplasm of breast: Secondary | ICD-10-CM | POA: Diagnosis not present

## 2016-02-02 DIAGNOSIS — Z1239 Encounter for other screening for malignant neoplasm of breast: Secondary | ICD-10-CM

## 2016-02-02 NOTE — Assessment & Plan Note (Signed)
Pending transplant 01/2016. Will follow.

## 2016-02-02 NOTE — Progress Notes (Signed)
Pre visit review using our clinic review tool, if applicable. No additional management support is needed unless otherwise documented below in the visit note. 

## 2016-02-02 NOTE — Patient Instructions (Addendum)
Due for physical; please make at your convenience.   Rooting for you and thinking of you.   We placed a referral. Mammogram this year. I asked that you call one the below locations and schedule this when it is convenient for you.   If you have dense breasts, you may ask for 3D mammogram over the traditional 2D mammogram as new evidence suggest 3D is superior. Please note that NOT all insurance companies cover 3D and you may have to pay a higher copay. You may call your insurance company to further clarify your benefits.   Options for [place]  [place], [place]  * [facility] [place] [place] * Note if you ask for 3D mammogram at this location, you must request [place], [place] location*    Pleasure meeting you

## 2016-02-02 NOTE — Assessment & Plan Note (Signed)
Blood pressure at goal.  continue current antihypertensive medications due to history of cardiomyopathy.

## 2016-02-02 NOTE — Assessment & Plan Note (Signed)
Ordered.  Patient will schedule. 

## 2016-02-02 NOTE — Progress Notes (Signed)
Subjective:    Patient ID: Robyn Montgomery, female    DOB: 24-Sep-1955, 61 y.o.   MRN: [id number]  CC: Robyn Montgomery is a 61 y.o. female who presents today for follow up.   HPI: Acute myeloid leukemia in remission- diagnosed 08/2015 after left DVT on trip to [place]. 21 days of chemo [facility] 08/2015. Another 7 day round of chemo. Started blood pressure medication due to cardiomyopathy, repeat echo scheduled this week.  Planning for transplant from sister next week. Will stay in [facility] for 21 days.   Follows with dr foster ( onc) and Dr Oretha Ellis ( heme) Proceeding with allo stem cell transplant. Developed cardiomyopathy after cycle 1 consodilation.  Dr Hoyt Koch cardiology, echo [id number] showed 40-45% EF.   Last LLE [place], DVT still there. Continuing to stay on lovenox. No bleeding, SOB.   On valtrex due to AML.     HISTORY:  Past Medical History:  Diagnosis Date  . Osteoporosis    Past Surgical History:  Procedure Laterality Date  . broken shoulder Right 2012   Plates and screws put in  . MOLE REMOVAL  1977   Removed from left breast  . SHOULDER SURGERY     Family History  Problem Relation Age of Onset  . Arthritis Mother   . Hypertension Mother   . Cancer Mother 74    Breast  . Osteoporosis Mother   . Breast cancer Mother 18  . Parkinsonism Father   . Heart disease Maternal Uncle   . Cancer Paternal Grandfather     Oral    Allergies: Dapsone; Rasburicase; Penicillins; Sulfa antibiotics; Vancomycin; Cefepime; Morphine; and Tape No current outpatient prescriptions on file prior to visit.   No current facility-administered medications on file prior to visit.     Social History  Substance Use Topics  . Smoking status: Never Smoker  . Smokeless tobacco: Never Used  . Alcohol use 0.0 oz/week     Comment: Rare    Review of Systems  Constitutional: Negative for chills and fever.  Respiratory: Negative for cough and shortness of breath.   Cardiovascular: Negative for chest pain,  palpitations and leg swelling.  Gastrointestinal: Negative for nausea and vomiting.      Objective:    BP 124/86   Pulse 73   Temp 97.8 F (36.6 C) (Oral)   Ht 5\' 4"  (1.626 m)   Wt 140 lb 6.4 oz (63.7 kg)   SpO2 99%   BMI 24.10 kg/m  BP Readings from Last 3 Encounters:  02/02/16 124/86  08/27/15 124/73  08/25/15 127/83   Wt Readings from Last 3 Encounters:  02/02/16 140 lb 6.4 oz (63.7 kg)  08/26/15 147 lb 11.2 oz (67 kg)  08/21/15 150 lb (68 kg)    Physical Exam  Constitutional: She appears well-developed and well-nourished.  Eyes: Conjunctivae are normal.  Cardiovascular: Normal rate, regular rhythm, normal heart sounds and normal pulses.   Pulmonary/Chest: Effort normal and breath sounds normal. She has no wheezes. She has no rhonchi. She has no rales.  Neurological: She is alert.  Skin: Skin is warm and dry.  Psychiatric: She has a normal mood and affect. Her speech is normal and behavior is normal. Thought content normal.  Vitals reviewed.      Assessment & Plan:   Problem List Items Addressed This Visit      Cardiovascular and Mediastinum   Secondary cardiomyopathy ([place])    Blood pressure at goal.  continue current antihypertensive medications due to  history of cardiomyopathy.      Relevant Medications   enoxaparin (LOVENOX) 60 MG/0.6ML injection   metoprolol succinate (TOPROL-XL) 50 MG 24 hr tablet   lisinopril (PRINIVIL,ZESTRIL) 5 MG tablet     Other   Acute myeloid leukemia ([place])    Pending transplant 01/2016. Will follow.       Relevant Medications   ondansetron (ZOFRAN-ODT) 4 MG disintegrating tablet   valACYclovir (VALTREX) 500 MG tablet   Screening for breast cancer - Primary    Ordered. Patient will schedule.      Relevant Orders   MM DIGITAL SCREENING BILATERAL       I have discontinued Ms. Brissette's traMADol and fluconazole. I am also having her maintain her enoxaparin, metoprolol succinate, ondansetron, valACYclovir, hydrOXYzine,  lisinopril, and prednisoLONE acetate.   Meds ordered this encounter  Medications  . enoxaparin (LOVENOX) 60 MG/0.6ML injection    Sig: Inject 60 mg into the skin.  [name] Kitchen DISCONTD: lisinopril (PRINIVIL,ZESTRIL) 5 MG tablet    Sig: Take 5 mg by mouth.  . metoprolol succinate (TOPROL-XL) 50 MG 24 hr tablet    Sig: Take 100 mg by mouth.  . ondansetron (ZOFRAN-ODT) 4 MG disintegrating tablet    Sig: Take 4 mg by mouth.  . valACYclovir (VALTREX) 500 MG tablet    Sig: Take 500 mg by mouth.  . DISCONTD: fluconazole (DIFLUCAN) 200 MG tablet    Refill:  0  . hydrOXYzine (ATARAX/VISTARIL) 25 MG tablet    Refill:  0  . lisinopril (PRINIVIL,ZESTRIL) 5 MG tablet    Refill:  2  . prednisoLONE acetate (PRED FORTE) 1 % ophthalmic suspension    Refill:  0    Return precautions given.   Risks, benefits, and alternatives of the medications and treatment plan prescribed today were discussed, and patient expressed understanding.   Education regarding symptom management and diagnosis given to patient on AVS.  Continue to follow with Mable Paris, FNP for routine health maintenance.   [place] and I agreed with plan.   Mable Paris, FNP

## 2016-02-12 HISTORY — PX: LIMBAL STEM CELL TRANSPLANT: SHX1969

## 2016-02-24 DIAGNOSIS — Z9484 Stem cells transplant status: Secondary | ICD-10-CM | POA: Insufficient documentation

## 2016-02-25 ENCOUNTER — Encounter: Payer: Self-pay | Admitting: Family

## 2016-03-15 ENCOUNTER — Ambulatory Visit: Payer: BLUE CROSS/BLUE SHIELD

## 2016-04-05 DIAGNOSIS — D849 Immunodeficiency, unspecified: Secondary | ICD-10-CM | POA: Insufficient documentation

## 2016-04-28 ENCOUNTER — Ambulatory Visit
Admission: RE | Admit: 2016-04-28 | Discharge: 2016-04-28 | Disposition: A | Discharge status: Home / Self Care | Payer: BLUE CROSS/BLUE SHIELD | Source: Ambulatory Visit | Attending: Family | Admitting: Family

## 2016-04-28 DIAGNOSIS — Z1231 Encounter for screening mammogram for malignant neoplasm of breast: Secondary | ICD-10-CM | POA: Insufficient documentation

## 2016-04-28 DIAGNOSIS — Z1239 Encounter for other screening for malignant neoplasm of breast: Secondary | ICD-10-CM

## 2016-04-28 HISTORY — DX: Personal history of antineoplastic chemotherapy: Z92.21

## 2016-04-28 HISTORY — DX: Malignant (primary) neoplasm, unspecified: C80.1

## 2016-05-26 ENCOUNTER — Encounter: Payer: Self-pay | Admitting: Family

## 2016-05-26 DIAGNOSIS — K2970 Gastritis, unspecified, without bleeding: Secondary | ICD-10-CM | POA: Insufficient documentation

## 2016-07-09 MED FILL — VALACYCLOVIR/500MG/TAB: VALACYCLOVIR/500MG/TAB | 30 days supply | Qty: 30 | Fill #4

## 2016-07-13 ENCOUNTER — Ambulatory Visit: Admission: RE | Admit: 2016-07-13 | Discharge: 2016-07-13 | Disposition: A | Discharge status: Home / Self Care

## 2016-07-13 DIAGNOSIS — Z9481 Bone marrow transplant status: Secondary | ICD-10-CM

## 2016-07-13 DIAGNOSIS — Z9484 Stem cells transplant status: Principal | ICD-10-CM

## 2016-07-13 DIAGNOSIS — C9201 Acute myeloblastic leukemia, in remission: Secondary | ICD-10-CM

## 2016-07-13 DIAGNOSIS — I427 Cardiomyopathy due to drug and external agent: Secondary | ICD-10-CM

## 2016-07-13 DIAGNOSIS — T451X5A Adverse effect of antineoplastic and immunosuppressive drugs, initial encounter: Secondary | ICD-10-CM

## 2016-07-16 MED ORDER — MAGNESIUM OXIDE 400 MG (241.3 MG MAGNESIUM) TABLET
ORAL_TABLET | Freq: Two times a day (BID) | ORAL | 6 refills | 0 days | Status: CP
Start: 2016-07-16 — End: 2016-08-10

## 2016-07-26 ENCOUNTER — Ambulatory Visit
Admission: RE | Admit: 2016-07-26 | Discharge: 2016-07-26 | Disposition: A | Discharge status: Home / Self Care | Payer: BC Managed Care – PPO | Attending: Family | Admitting: Family

## 2016-07-26 ENCOUNTER — Ambulatory Visit
Admission: RE | Admit: 2016-07-26 | Discharge: 2016-07-26 | Disposition: A | Discharge status: Home / Self Care | Payer: BC Managed Care – PPO

## 2016-07-26 DIAGNOSIS — Z9484 Stem cells transplant status: Secondary | ICD-10-CM

## 2016-07-26 DIAGNOSIS — C9500 Acute leukemia of unspecified cell type not having achieved remission: Secondary | ICD-10-CM

## 2016-07-26 DIAGNOSIS — M810 Age-related osteoporosis without current pathological fracture: Secondary | ICD-10-CM

## 2016-07-26 DIAGNOSIS — Z9481 Bone marrow transplant status: Secondary | ICD-10-CM

## 2016-07-26 DIAGNOSIS — D61818 Other pancytopenia: Secondary | ICD-10-CM

## 2016-07-26 DIAGNOSIS — I427 Cardiomyopathy due to drug and external agent: Secondary | ICD-10-CM

## 2016-07-26 DIAGNOSIS — C9201 Acute myeloblastic leukemia, in remission: Secondary | ICD-10-CM

## 2016-07-26 DIAGNOSIS — I509 Heart failure, unspecified: Secondary | ICD-10-CM

## 2016-07-26 DIAGNOSIS — I824Z2 Acute embolism and thrombosis of unspecified deep veins of left distal lower extremity: Secondary | ICD-10-CM

## 2016-07-26 DIAGNOSIS — D899 Disorder involving the immune mechanism, unspecified: Secondary | ICD-10-CM

## 2016-07-26 DIAGNOSIS — I82492 Acute embolism and thrombosis of other specified deep vein of left lower extremity: Secondary | ICD-10-CM

## 2016-07-27 LAB — CMV QUANT: Lab: 0

## 2016-07-27 LAB — CMV DNA, QUANTITATIVE, PCR: CMV VIRAL LD: NOT DETECTED

## 2016-07-27 NOTE — Unmapped (Signed)
PATIENT NO LONGER ON THERAPY.

## 2016-07-28 LAB — EBV DNA DETECT/QUANT: Lab: NOT DETECTED

## 2016-07-30 NOTE — Unmapped (Signed)
TC from the patient on the nurse triage line.  Requesting the results of her chimerism testing done earlier this week.  I read the report to her.  She says she will wait to hear from the doctor about next steps.

## 2016-08-07 MED FILL — VALACYCLOVIR/500MG/TAB: VALACYCLOVIR/500MG/TAB | 30 days supply | Qty: 30 | Fill #5

## 2016-08-10 ENCOUNTER — Ambulatory Visit
Admission: RE | Admit: 2016-08-10 | Discharge: 2016-08-10 | Disposition: A | Discharge status: Home / Self Care | Payer: BC Managed Care – PPO | Attending: Primary Care | Admitting: Primary Care

## 2016-08-10 ENCOUNTER — Ambulatory Visit
Admission: RE | Admit: 2016-08-10 | Discharge: 2016-08-10 | Disposition: A | Discharge status: Home / Self Care | Payer: BC Managed Care – PPO

## 2016-08-10 DIAGNOSIS — Z9484 Stem cells transplant status: Secondary | ICD-10-CM

## 2016-08-10 DIAGNOSIS — C9201 Acute myeloblastic leukemia, in remission: Secondary | ICD-10-CM

## 2016-08-10 DIAGNOSIS — R945 Abnormal results of liver function studies: Secondary | ICD-10-CM

## 2016-08-10 DIAGNOSIS — Z9481 Bone marrow transplant status: Secondary | ICD-10-CM

## 2016-08-10 DIAGNOSIS — I427 Cardiomyopathy due to drug and external agent: Secondary | ICD-10-CM

## 2016-08-10 LAB — CBC W/ AUTO DIFF
BASOPHILS ABSOLUTE COUNT: 0 10*9/L (ref 0.0–0.1)
EOSINOPHILS ABSOLUTE COUNT: 0.1 10*9/L (ref 0.0–0.4)
HEMATOCRIT: 32.1 % — ABNORMAL LOW (ref 36.0–46.0)
HEMOGLOBIN: 10.6 g/dL — ABNORMAL LOW (ref 12.0–16.0)
LYMPHOCYTES ABSOLUTE COUNT: 0.4 10*9/L — ABNORMAL LOW (ref 1.5–5.0)
MEAN CORPUSCULAR HEMOGLOBIN CONC: 32.9 g/dL (ref 31.0–37.0)
MEAN CORPUSCULAR HEMOGLOBIN: 33.8 pg (ref 26.0–34.0)
MEAN CORPUSCULAR VOLUME: 102.8 fL — ABNORMAL HIGH (ref 80.0–100.0)
MONOCYTES ABSOLUTE COUNT: 0.3 10*9/L (ref 0.2–0.8)
NEUTROPHILS ABSOLUTE COUNT: 2.1 10*9/L (ref 2.0–7.5)
PLATELET COUNT: 214 10*9/L (ref 150–440)
RED BLOOD CELL COUNT: 3.13 10*12/L — ABNORMAL LOW (ref 4.00–5.20)
RED CELL DISTRIBUTION WIDTH: 14.4 % (ref 12.0–15.0)
WBC ADJUSTED: 3 10*9/L — ABNORMAL LOW (ref 4.5–11.0)

## 2016-08-10 LAB — COMPREHENSIVE METABOLIC PANEL
ALBUMIN: 4.2 g/dL (ref 3.5–5.0)
ALKALINE PHOSPHATASE: 122 U/L (ref 38–126)
ALT (SGPT): 170 U/L — ABNORMAL HIGH (ref 15–48)
AST (SGOT): 107 U/L — ABNORMAL HIGH (ref 14–38)
BLOOD UREA NITROGEN: 10 mg/dL (ref 7–21)
BUN / CREAT RATIO: 14
CALCIUM: 9.8 mg/dL (ref 8.5–10.2)
CHLORIDE: 103 mmol/L (ref 98–107)
CO2: 28 mmol/L (ref 22.0–30.0)
CREATININE: 0.71 mg/dL (ref 0.60–1.00)
EGFR MDRD AF AMER: >=60 mL/min/{1.73_m2} (ref >=60)
GLUCOSE RANDOM: 79 mg/dL (ref 65–179)
POTASSIUM: 3.8 mmol/L (ref 3.5–5.0)
PROTEIN TOTAL: 6.5 g/dL (ref 6.5–8.3)
SODIUM: 140 mmol/L (ref 135–145)

## 2016-08-10 LAB — BLOOD UREA NITROGEN: Urea nitrogen:MCnc:Pt:Ser/Plas:Qn:: 10

## 2016-08-10 LAB — GAMMA GLUTAMYL TRANSFERASE: Gamma glutamyl transferase:CCnc:Pt:Ser/Plas:Qn:: 115 — ABNORMAL HIGH

## 2016-08-10 LAB — AMYLASE: Chemistry studies:Cmplx:-:^Patient:Set:: 62

## 2016-08-10 LAB — LIPASE: Triacylglycerol lipase:CCnc:Pt:Ser/Plas:Qn:: 214

## 2016-08-10 LAB — HEMOGLOBIN: Lab: 10.6 — ABNORMAL LOW

## 2016-08-10 NOTE — Unmapped (Signed)
BMT Clinic Follow-up Note    Patient Name: Christine [name]  Medical Record Number:  [id number]  Encounter Date: 08/10/2016    BMT Attending MD:  Lanae Boast, MD.     HPI: Shaunette Gassner Gueye is a 61 y.o. 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 D +168. Tac stop date 06/15/16.      Interval History:  Mrs. Tozzi presents to clinic today with her husband. Overall doing well and feeling well with no new complaints today. She has been off of tac for 8 weeks. She has no e/o GvHD [specifically rash, N/V/D ], however today she does have a new significant increase in her LFTs. She is not taking any offending medications, new supplements, tylenol or drinking any alcohol. She denies any abdominal pain, discomfort or new GI issues. Her PB chimerism had slight improvement, but has leveled off and at last check the CD3 had dropped to 74%. Repeat has been drawn today. Her appetite is good. She is drinking well. She denies any SOB, CP, fever, chills, or infectious symptoms.     PMH, PSH, and PSocH: Reviewed and there are no changes.    Allergies:  Dapsone; Rasburicase; Vancomycin analogues; Adhesive; Cefepime; Morphine; and Penicillins   Medications: Reviewed and updated in EPIC.     Review of Systems:  A comprehensive review of systems was negative except for pertinent positives noted in HPI.    Objective:  VS: BP 107/66  - Pulse 82  - Temp 36.6 ??C (97.9 ??F) (Oral)  - Resp 16  - Wt 56.1 kg (123 lb 9.6 oz)  - LMP  (LMP Unknown)  - SpO2 100%  - BMI 21.21 kg/m??     KPS: 80%, Normal activity with effort; some signs or symptoms of disease (ECOG equivalent 1)    Acute Graft versus Host Disease Assessment: 08/10/2016  Organ stage:    Skin stage 0, No Rash, none, 0 % BSA;   Upper GI stage 0, no nausea and no vomiting;   Lower GI stage 0, No Diarrhea;   Hepatic stage 0, Bilirubin normal - 2mg /dL    Overall grade (Current) None Physical Exam:   General appearance: No distress.    Head: Alopecia. Wears wig. Normal, non-tender sinuses.   Eyes: Conjunctivae and sclera clear.    Neck: Supple, no palpable LAD.   Lungs: Clear lungs, no wheeze, crackle, rhonchi.   Heart: RRR. Normal S1/S2. No murmur, rub, or gallop.  Abdomen: Soft, non-tender, normoactive BS, no organomegaly.   Extremities: No edema. Pulses palpable in all extremities.   MSK: No joint deformity, tenderness, or swelling.   Skin: Warm, dry, and intact. No rashes.   Line: R PAC is accessed, does not appear to be infected - non-tender, no erythema.     Test Results:  Lab Results   Component Value Date    WBC 3.0 (L) 08/10/2016    HGB 10.6 (L) 08/10/2016    HCT 32.1 (L) 08/10/2016    PLT 214 08/10/2016       Lab Results   Component Value Date    NA 140 08/10/2016    K 3.8 08/10/2016    CL 103 08/10/2016    CO2 28.0 08/10/2016    BUN 10 08/10/2016    CREATININE 0.71 08/10/2016    GLU 79 08/10/2016    CALCIUM 9.8 08/10/2016    MG 1.8 07/26/2016    PHOS  3.0 04/15/2016       Lab Results   Component Value Date    BILITOT 0.4 08/10/2016    BILIDIR <0.10 04/15/2016    PROT 6.5 08/10/2016    ALBUMIN 4.2 08/10/2016    ALT 170 (H) 08/10/2016    AST 107 (H) 08/10/2016    ALKPHOS 122 08/10/2016    GGT 115 (H) 08/10/2016     Assessment/Plan:  AML:??(FLT3-ITD, +11, and IDH2) in CR prior to transplant.   06/22/16: BMBx:   -  Normocellular bone marrow (40%) with trilineage hematopoiesis and 3% blasts.  -  Routine cytogenetics: donor karyotype; FISH (trisomy 11) results are normal.  -  Flow cytometric MRD results reveal no evidence of aberrant myeloid antigen expression or abnormal myeloblasts.  -  DNA Chimerism see below.  ??  - Consider maintenance with FLT3 inhibitor (sorafenib). Was reluctant to start around D+45-60 due to pancytopenia and changing chimerism. Following closely.     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 Conditioning:??  ????????????????????????Busulfan PK calculated from test dose 80 mg/hr daily and Fludarabine 40 mg/m2 daily ??day -5 thru day -2  ????????????????????????Day 0 -??02/24/16  Cell dose:??3.24 x10^8 TNC (Marrow)  Chimerisms/Marrow:  03/31/16: Day 30 marrow normocellular w/1% blasts. FISH and cytos are normal. MRD negative.   BM Chimerisms show 75% donor CD3 and >95% donor in unfractionated compartment.   04/22/16: PB Chimerisms show 71% donor CD3 and >95% donor unfractionated.   05/05/16: BM Chimerisms show 65% donor CD3 and >95% donor unfractionated.   05/18/16: PB Chimerisms show 74% donor CD3 and 95% donor unfractionated.   06/01/16: PB Chimerisms show 69% donor CD3 and >95% donor unfractionated.  06/22/16: PB Chimerisms show 80% donor CD3 and >95% donor unfractionated  06/22/16: BM Chimerisms show 79% donor CD3 and >95% donor unfractionated.   07/06/16: PB Chimerisms show 77% donor CD3 and >95% donor unfractionated.  07/13/16: Off tac x 4 weeks. Chimerism check next visit.  If no full chimerism at 8-10 weeks after discontinuation of tac will consider DLI. We do not have any donor cells stored. If we need to recollect for DLI, will consider mobilizing the donor and collecting CD3+ cells for DLI as well as CD34+ cells for another transplant.   07/26/16: Off tac almost 6 weeks. DNA chimerism shows PB CD3 has decreased to 74%, she remains full donor in unfractionated.  08/10/16: Off tac 8 weeks. Repeat DNA pending. Plan after discussion with Dr. Oswald Hillock to re-collect sister for more donor cells for DLI. However, today with elevated LFTs, this is new, will need to follow these and make sure no GVHD prior to contacting sister for further donation. This was discussed with Pam today.     GVHD Prophy: Mixed chimera.   - Methotrexate on day +1, 3, 6, 11 (received all 4 doses)  06/15/16: Tac tapered and eventually DC'd today.    08/10/16:  Has had no e/o GVHD, but today does have new elevated LFTs. This is isolated to AST/ALT abnormality. Bilirubin is normal.     Heme:??  Transfusion criteria: * Pre-med with tylenol and Zyrtec pre- blood or platelet transfusion.   - Hgb <8 and Plt <10K or bleeding.   - Granix if ANC <0.5.     Hx of DVT:  - Completed 6 mos or anticoagulation.     ID:??  Prophylaxis:   - Continue Valtrex for Zoster prophy 500 mg qd.   - Bactrim  stopped due to pancytopenia. Allergic to dapsone. Switched to Pentamidine (Dose #1 - 5/8).   06/30/16: Off IS. CD4 count 126. Continue monthly Pentamidine.   07/13/16: Inhaled pentamidine.   Due at next visit on 8/14. Scheduled for 3rd floor.     Viral studies:  - Weekly viral panels negative [7/3]. CMV/EBV pending today. If LFTs continue to trend up will check adenovirus and Hepatitis serologies.   ??  CV:   Hx of anthracycline induced cardiomyopathy:  - Nov 2017 w/EF 35%. This has now resolved with 01/2016 Echo showing estimated EF of 50%.   - Treated with lisinopril and metoprolol (Metoprolol 50mg  XL).   07/13/16 Now off all antihypertensives due to intolerance.     HTN: Lisinopril stopped d/t AKI.   04/07/16: D/C'd Norvasc. Due to asymptomatic hypotension. Pre-transplant blood pressures run in the 100/70-80 range per her report.   Per Dr. Barbette Merino, ok to stop metoprolol given low BPs with plan to repeat Echo in 6-8 weeks.  07/06/16: Baseline ECHO today showed EF of 45-50%. DC metoprolol.   07/13/16: Follow BP. Will repeat ECHO 8-12 weeks after stopping metoprolol. Scheduled for 10/26/2016    Pulm: DLCO adj: 85% ([place])    GI/Hepatic:   - Completed VOD prophylaxis.     Elevated LFTs:  -Mild elevation at visit on 7/16  - Today 7/31 AST and ALT now 107/170 respectively. Normal amylase and lipase. No abdominal pain or GI symptoms.   Plan to repeat labs on Friday this week, if still rising, will schedule for abdominal ultrasound next week.     Bone Health:  03/31/16: DEXA scan showed osteoporosis of the spine and osteopenia of the femoral neck. Zometa and Ca/Vit D.   04/02/16: Received 1st dose of Zometa.   07/06/16. 2nd dose of Zometa. Completed. Continue Ca/Vit D supplement.     Summary:  - Off tac ~ 8 weeks now. No e/o GvHD, other than new elevation in LFTs today.  DNA chimerism sent today.    - Last chimerism with CD3 at 74%, this has been stable to slightly decreasing since she has been off tac now for several weeks. Discussed with Dr. Oswald Hillock plan was to move forward with re-collecting sister (donor) for DLI, however given new rise in LFTs will hold off on this for now. Can discuss plan with Toniann Fail TNC once LFT issues resolve and ready to move forward.   - Will repeat LFTs this Friday with lab draw at [facility]. If LFTs still rising will need to schedule for abdominal Ultrasound as next first step in workup. Concern for GVHD as unlikely with other differentials (no new meds, azoles, tylenol or alcohol intake) and recent tac wean. If continues to rise would consider resuming tacrolimus if ultrasound is unrevealing. Will plan to check adenovirus and hepatitis serology if continues to rise.           Almira Bar Adult Nurse Practitioner  08/10/2016  4:56 PM

## 2016-08-10 NOTE — Unmapped (Addendum)
Your liver function tests are elevated today, highter than you have been. This could be an isolated finding, you are not taking any medications or supplements that could be causing this, so it is a little odd. We always worry about GVHD.    We will check another lab later this week to repeat your liver tests. If they are increasing we will need to evaluate further with an ultrasound of your liver.     For now let Christine [name] know if you have any new GI symptoms (pain, nausea, vomiting, diarrhea)    We will set up your lab draw at [facility]. I will confirm with you tomorrow if this is ok. You can walk in and go to the lab to get this drawn.    [address]  [place], [place] [address]    We may need to add another visit here next week for an ultrasound if the numbers are still rising.     Your DNA chimerism have stayed about the same. We discussed this with Dr. Oswald Hillock and she would like Christine [name] to proceed with planning to re-collect your sister for more cells to get a DLI ( donor lymphocyte infusion) to see if this will boost your chimerism numbers. I will talk to Toniann Fail about the plan and we will contact your sister.     We will need to make sure your liver function test improve back to normal before we would feel comfortable proceeding with the cell infusion.       Lab Results   Component Value Date    WBC 3.0 (L) 08/10/2016    HGB 10.6 (L) 08/10/2016    HCT 32.1 (L) 08/10/2016    PLT 214 08/10/2016       Lab Results   Component Value Date    NA 140 08/10/2016    K 3.8 08/10/2016    CL 103 08/10/2016    CO2 28.0 08/10/2016    BUN 10 08/10/2016    CREATININE 0.71 08/10/2016    GLU 79 08/10/2016    CALCIUM 9.8 08/10/2016    MG 1.8 07/26/2016    PHOS 3.0 04/15/2016       Lab Results   Component Value Date    BILITOT 0.4 08/10/2016    BILIDIR <0.10 04/15/2016    PROT 6.5 08/10/2016    ALBUMIN 4.2 08/10/2016    ALT 170 (H) 08/10/2016    AST 107 (H) 08/10/2016    ALKPHOS 122 08/10/2016    GGT 115 (H) 08/10/2016       Lab Results   Component Value Date    INR 1.22 04/15/2016    APTT 24.3 (L) 04/15/2016       --------------------------------------------------------------------------------------------------------------------  For appointments & questions Monday through Friday 8 AM-4:30 PM     Please call [phone] or Toll free [phone]    On Nights, Weekends and Holidays  Call [phone] and ask for the oncologist on call    Please visit PrivacyF[employer]cz, a resource created just for family members and caregivers.  This website lists support services, how and where to ask for help. It has tools to assist you as you help Christine [name] care for your loved one.    [facility]  [address]  [place], [place] [address]  [email or web address]

## 2016-08-11 LAB — CMV QUANT: Lab: 0

## 2016-08-11 LAB — CMV DNA, QUANTITATIVE, PCR: CMV VIRAL LD: NOT DETECTED

## 2016-08-12 LAB — EBV DNA DETECT/QUANT: Lab: NOT DETECTED

## 2016-08-13 ENCOUNTER — Ambulatory Visit
Admission: RE | Admit: 2016-08-13 | Discharge: 2016-08-13 | Disposition: A | Discharge status: Home / Self Care | Payer: BC Managed Care – PPO

## 2016-08-13 DIAGNOSIS — Z9481 Bone marrow transplant status: Secondary | ICD-10-CM

## 2016-08-13 DIAGNOSIS — R945 Abnormal results of liver function studies: Principal | ICD-10-CM

## 2016-08-13 DIAGNOSIS — Z9484 Stem cells transplant status: Secondary | ICD-10-CM

## 2016-08-13 LAB — CBC W/ AUTO DIFF
BASOPHILS ABSOLUTE COUNT: 0 10*9/L (ref 0.0–0.1)
EOSINOPHILS ABSOLUTE COUNT: 0.1 10*9/L (ref 0.0–0.4)
HEMOGLOBIN: 11.9 g/dL — ABNORMAL LOW (ref 13.5–16.0)
LARGE UNSTAINED CELLS: 3 % (ref 0–4)
LYMPHOCYTES ABSOLUTE COUNT: 0.5 10*9/L — ABNORMAL LOW (ref 1.5–5.0)
MEAN CORPUSCULAR HEMOGLOBIN: 35.2 pg — ABNORMAL HIGH (ref 26.0–34.0)
MEAN CORPUSCULAR VOLUME: 104.6 fL — ABNORMAL HIGH (ref 80.0–100.0)
MEAN PLATELET VOLUME: 7.2 fL (ref 7.0–10.0)
MONOCYTES ABSOLUTE COUNT: 0.3 10*9/L (ref 0.2–0.8)
NEUTROPHILS ABSOLUTE COUNT: 3.2 10*9/L (ref 2.0–7.5)
PLATELET COUNT: 176 10*9/L (ref 150–440)
RED BLOOD CELL COUNT: 3.39 10*12/L — ABNORMAL LOW (ref 4.00–5.20)
WBC ADJUSTED: 4.3 10*9/L — ABNORMAL LOW (ref 4.5–11.0)

## 2016-08-13 LAB — COMPREHENSIVE METABOLIC PANEL
ALBUMIN: 4.5 g/dL (ref 3.5–5.0)
ALKALINE PHOSPHATASE: 177 U/L — ABNORMAL HIGH (ref 38–126)
ALT (SGPT): 283 U/L — ABNORMAL HIGH (ref 15–48)
ANION GAP: 12 mmol/L (ref 9–15)
AST (SGOT): 191 U/L — ABNORMAL HIGH (ref 14–38)
BILIRUBIN TOTAL: 0.5 mg/dL (ref 0.0–1.2)
BUN / CREAT RATIO: 16
CHLORIDE: 106 mmol/L (ref 98–107)
CO2: 29 mmol/L (ref 22.0–30.0)
CREATININE: 0.75 mg/dL (ref 0.60–1.00)
EGFR MDRD AF AMER: >=60 mL/min/{1.73_m2} (ref >=60)
EGFR MDRD NON AF AMER: >=60 mL/min/{1.73_m2} (ref >=60)
GLUCOSE RANDOM: 94 mg/dL (ref 65–179)
POTASSIUM: 4.7 mmol/L (ref 3.5–5.0)
PROTEIN TOTAL: 6.9 g/dL (ref 6.5–8.3)
SODIUM: 147 mmol/L — ABNORMAL HIGH (ref 135–145)

## 2016-08-13 LAB — RED BLOOD CELL COUNT: Lab: 3.39 — ABNORMAL LOW

## 2016-08-13 LAB — EGFR MDRD NON AF AMER: Glomerular filtration rate/1.73 sq M.predicted.non black:ArVRat:Pt:Ser/Plas/Bld:Qn:Creatinine-based formula (MDRD): >=60

## 2016-08-14 NOTE — Unmapped (Signed)
I called Mrs. Reasor to discuss her lab results from today. She had labs drawn at [facility] this morning to follow up liver function test which were newly elevated at her clinic visit earlier this week.     Her LFTs continue to rise. I discussed results with Dr. Oswald Hillock. Mrs. Giovanetti will return next week for repeat labs and liver ultrasound. Pending results of this and repeat labs we may need to pursue a liver biopsy.     Mrs. Bouler has been off her tacrolimus for about 8 weeks now and has no evidence of GVHD, but now has new LFT abnormalities which could potentially be a GVHD process. Currently she has no GI symptoms other than mild abdominal fullness, but this is not a new symptom. She remains mixed chimera with most recent CD3 donor percentage around 79% and she is fully donor in her unfractionated compartment.     She verbalized understanding of the plan. Will repeat chimerism at next weeks visit to monitor.     Almira Bar Adult Nurse Practitioner  08/13/2016  6:10 PM

## 2016-08-14 NOTE — Unmapped (Signed)
error 

## 2016-08-18 ENCOUNTER — Ambulatory Visit
Admission: RE | Admit: 2016-08-18 | Discharge: 2016-08-18 | Disposition: A | Discharge status: Home / Self Care | Payer: BC Managed Care – PPO | Attending: Primary Care | Admitting: Primary Care

## 2016-08-18 ENCOUNTER — Ambulatory Visit
Admission: RE | Admit: 2016-08-18 | Discharge: 2016-08-18 | Disposition: A | Discharge status: Home / Self Care | Payer: BC Managed Care – PPO

## 2016-08-18 DIAGNOSIS — R945 Abnormal results of liver function studies: Secondary | ICD-10-CM

## 2016-08-18 DIAGNOSIS — Z9484 Stem cells transplant status: Secondary | ICD-10-CM

## 2016-08-18 DIAGNOSIS — C9201 Acute myeloblastic leukemia, in remission: Secondary | ICD-10-CM

## 2016-08-18 DIAGNOSIS — I427 Cardiomyopathy due to drug and external agent: Secondary | ICD-10-CM

## 2016-08-18 DIAGNOSIS — D899 Disorder involving the immune mechanism, unspecified: Secondary | ICD-10-CM

## 2016-08-18 DIAGNOSIS — Z9481 Bone marrow transplant status: Secondary | ICD-10-CM

## 2016-08-18 LAB — COMPREHENSIVE METABOLIC PANEL
ALBUMIN: 4.3 g/dL (ref 3.5–5.0)
ALKALINE PHOSPHATASE: 218 U/L — ABNORMAL HIGH (ref 38–126)
ALT (SGPT): 264 U/L — ABNORMAL HIGH (ref 15–48)
ANION GAP: 11 mmol/L (ref 9–15)
AST (SGOT): 144 U/L — ABNORMAL HIGH (ref 14–38)
BLOOD UREA NITROGEN: 9 mg/dL (ref 7–21)
BUN / CREAT RATIO: 13
CALCIUM: 9.5 mg/dL (ref 8.5–10.2)
CHLORIDE: 105 mmol/L (ref 98–107)
CO2: 27 mmol/L (ref 22.0–30.0)
CREATININE: 0.69 mg/dL (ref 0.60–1.00)
EGFR MDRD NON AF AMER: >=60 mL/min/{1.73_m2} (ref >=60)
GLUCOSE RANDOM: 98 mg/dL (ref 65–179)
POTASSIUM: 4.1 mmol/L (ref 3.5–5.0)
PROTEIN TOTAL: 6.8 g/dL (ref 6.5–8.3)
SODIUM: 143 mmol/L (ref 135–145)

## 2016-08-18 LAB — BUN / CREAT RATIO: Urea nitrogen/Creatinine:MRto:Pt:Ser/Plas:Qn:: 13

## 2016-08-18 LAB — CBC W/ AUTO DIFF
BASOPHILS ABSOLUTE COUNT: 0 10*9/L (ref 0.0–0.1)
EOSINOPHILS ABSOLUTE COUNT: 0.1 10*9/L (ref 0.0–0.4)
HEMATOCRIT: 32.4 % — ABNORMAL LOW (ref 36.0–46.0)
HEMOGLOBIN: 10.9 g/dL — ABNORMAL LOW (ref 12.0–16.0)
LARGE UNSTAINED CELLS: 4 % (ref 0–4)
MEAN CORPUSCULAR HEMOGLOBIN CONC: 33.6 g/dL (ref 31.0–37.0)
MEAN CORPUSCULAR HEMOGLOBIN: 34.6 pg — ABNORMAL HIGH (ref 26.0–34.0)
MEAN CORPUSCULAR VOLUME: 102.9 fL — ABNORMAL HIGH (ref 80.0–100.0)
MEAN PLATELET VOLUME: 6.6 fL — ABNORMAL LOW (ref 7.0–10.0)
MONOCYTES ABSOLUTE COUNT: 0.3 10*9/L (ref 0.2–0.8)
NEUTROPHILS ABSOLUTE COUNT: 2.2 10*9/L (ref 2.0–7.5)
PLATELET COUNT: 230 10*9/L (ref 150–440)
RED BLOOD CELL COUNT: 3.14 10*12/L — ABNORMAL LOW (ref 4.00–5.20)
RED CELL DISTRIBUTION WIDTH: 13.7 % (ref 12.0–15.0)

## 2016-08-18 LAB — MACROCYTES

## 2016-08-18 NOTE — Unmapped (Addendum)
-   LFTs are getting better, we will still proceed with the ultrasound today    - I will call you when I get results    - Repeated chimerism today, we are holding off on proceeding with further cell collection for DLI at this time until we sort out what your liver function test are doing.     - Per study, you are due for bone marrow biopsy and PFTs. We can schedule the bone marrow for next week and plan for the PFTs next time you are in clinic.       Results for Christine [name], Christine [name] (MRN [id number]) as of 08/18/2016 13:31   Ref. Range 08/18/2016 12:36   WBC Latest Ref Range: 4.5 - 11.0 10*9/L 3.2 (L)   RBC Latest Ref Range: 4.00 - 5.20 10*12/L 3.14 (L)   HGB Latest Ref Range: 12.0 - 16.0 g/dL 29.5 (L)   HCT Latest Ref Range: 36.0 - 46.0 % 32.4 (L)   MCV Latest Ref Range: 80.0 - 100.0 fL 102.9 (H)   MCH Latest Ref Range: 26.0 - 34.0 pg 34.6 (H)   MCHC Latest Ref Range: 31.0 - 37.0 g/dL 28.4   RDW Latest Ref Range: 12.0 - 15.0 % 13.7   MPV Latest Ref Range: 7.0 - 10.0 fL 6.6 (L)   Platelet Latest Ref Range: 150 - 440 10*9/L 230   Absolute Neutrophils Latest Ref Range: 2.0 - 7.5 10*9/L 2.2   Absolute Lymphocytes Latest Ref Range: 1.5 - 5.0 10*9/L 0.5 (L)   Absolute Monocytes  Latest Ref Range: 0.2 - 0.8 10*9/L 0.3   Absolute Eosinophils Latest Ref Range: 0.0 - 0.4 10*9/L 0.1   Absolute Basophils  Latest Ref Range: 0.0 - 0.1 10*9/L 0.0   Macrocytosis Latest Ref Range: Not Present  Moderate (A)   Large Unstained Cells Latest Ref Range: 0 - 4 % 4   Sodium Latest Ref Range: 135 - 145 mmol/L 143   Potassium Latest Ref Range: 3.5 - 5.0 mmol/L 4.1   Chloride Latest Ref Range: 98 - 107 mmol/L 105   CO2 Latest Ref Range: 22.0 - 30.0 mmol/L 27.0   Bun Latest Ref Range: 7 - 21 mg/dL 9   Creatinine Latest Ref Range: 0.60 - 1.00 mg/dL 1.32   BUN/Creatinine Ratio Unknown 13   EGFR MDRD Non Af Amer Latest Ref Range: >=60 mL/min/1.90m2 >=60   EGFR MDRD Af Amer Latest Ref Range: >=60 mL/min/1.5m2 >=60   Anion Gap Latest Ref Range: 9 - 15 mmol/L 11   Glucose Latest Ref Range: 65 - 179 mg/dL 98   Calcium Latest Ref Range: 8.5 - 10.2 mg/dL 9.5   Albumin Latest Ref Range: 3.5 - 5.0 g/dL 4.3   Total Protein Latest Ref Range: 6.5 - 8.3 g/dL 6.8   Total Bilirubin Latest Ref Range: 0.0 - 1.2 mg/dL 0.5   AST Latest Ref Range: 14 - 38 U/L 144 (H)   ALT Latest Ref Range: 15 - 48 U/L 264 (H)   Alkaline Phosphatase Latest Ref Range: 38 - 126 U/L 218 (H)   DNA FINGERPRINTING, POST-TRANSPLANT, T-CELL (CD3) Unknown Rpt ((NONE))   DNA FINGERPRINTING, POST-TRANSPLANT, UNFRACTIONED ASSAY Unknown Rpt ((NONE))

## 2016-08-19 LAB — ADENOVIRUS PCR, BLOOD: Adenovirus DNA:PrThr:Pt:Ser/Plas:Ord:Probe.amp.tar: NEGATIVE

## 2016-08-19 LAB — HHV6 PCR, BLOOD: Herpes virus 6 DNA:PrThr:Pt:Ser:Ord:Probe.amp.tar: NEGATIVE

## 2016-08-19 NOTE — Unmapped (Signed)
BMT Clinic Follow-up Note    Patient Name: Christine [name]  Medical Record Number:  [id number]  Encounter Date: 08/18/2016    BMT Attending MD:  Lanae Boast, MD.     HPI: Christine [name] is a 61 y.o. 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 D +176. Tac stop date 06/15/16.      Interval History:  Christine [name] presents to clinic today with her husband and sister. Overall doing well and feeling well with no new complaints today. She has been off of tac for 9 weeks. She has no e/o GvHD [specifically rash, N/V/D ], however last week developed a significant increase in her LFTs. She is not taking any offending medications, new supplements, tylenol or drinking any alcohol. She denies any abdominal pain, discomfort or new GI issues. She had labs repeated at the end of last week and LFTs had continued to trend up, still with no GI complaints. Today her LFTs show a slight decrease.  Her PB chimerism had slight improvement, last week. Repeat has been drawn today. Her appetite is good. She is drinking well. She denies any SOB, CP, fever, chills, or infectious symptoms. She is scheduled for liver ultrasound this afternoon.     PMH, PSH, and PSocH: Reviewed and there are no changes.    Allergies:  Dapsone; Rasburicase; Vancomycin analogues; Adhesive; Cefepime; Morphine; and Penicillins   Medications: Reviewed and updated in EPIC.     Review of Systems:  A comprehensive review of systems was negative except for pertinent positives noted in HPI.    Objective:  VS: BP 105/72  - Pulse 84  - Temp 36.8 ??C (98.2 ??F) (Oral)  - Resp 18  - Wt 55.7 kg (122 lb 11.2 oz)  - LMP  (LMP Unknown)  - SpO2 100%  - BMI 21.05 kg/m??     KPS: 80%, Normal activity with effort; some signs or symptoms of disease (ECOG equivalent 1)    Acute Graft versus Host Disease Assessment: 08/18/2016  Organ stage:    Skin stage 0, No Rash, none, 0 % BSA;   Upper GI stage 0, no nausea and no vomiting;   Lower GI stage 0, No Diarrhea;   Hepatic stage 0, Bilirubin normal - 2mg /dL    Overall grade (Current) None     Physical Exam: Unchanged from visit on 08/10/16  General appearance: No distress.    Head: Alopecia. Wears wig. Normal, non-tender sinuses.   Eyes: Conjunctivae and sclera clear.    Neck: Supple, no palpable LAD.   Lungs: Clear lungs, no wheeze, crackle, rhonchi.   Heart: RRR. Normal S1/S2. No murmur, rub, or gallop.  Abdomen: Soft, non-tender, normoactive BS, no organomegaly.   Extremities: No edema. Pulses palpable in all extremities.   MSK: No joint deformity, tenderness, or swelling.   Skin: Warm, dry, and intact. No rashes.   Line: R PAC is accessed, does not appear to be infected - non-tender, no erythema.     Test Results:  Lab Results   Component Value Date    WBC 3.2 (L) 08/18/2016    HGB 10.9 (L) 08/18/2016    HCT 32.4 (L) 08/18/2016    PLT 230 08/18/2016       Lab Results   Component Value Date    NA 143 08/18/2016    K 4.1 08/18/2016    CL 105 08/18/2016    CO2 27.0 08/18/2016  BUN 9 08/18/2016    CREATININE 0.69 08/18/2016    GLU 98 08/18/2016    CALCIUM 9.5 08/18/2016    MG 1.8 07/26/2016    PHOS 3.0 04/15/2016       Lab Results   Component Value Date    BILITOT 0.5 08/18/2016    BILIDIR <0.10 04/15/2016    PROT 6.8 08/18/2016    ALBUMIN 4.3 08/18/2016    ALT 264 (H) 08/18/2016    AST 144 (H) 08/18/2016    ALKPHOS 218 (H) 08/18/2016    GGT 115 (H) 08/10/2016     Assessment/Plan:  AML:??(FLT3-ITD, +11, and IDH2) in CR prior to transplant.   06/22/16: BMBx:   -  Normocellular bone marrow (40%) with trilineage hematopoiesis and 3% blasts.  -  Routine cytogenetics: donor karyotype; FISH (trisomy 11) results are normal.  -  Flow cytometric MRD results reveal no evidence of aberrant myeloid antigen expression or abnormal myeloblasts.  -  DNA Chimerism see below.  ??  - Consider maintenance with FLT3 inhibitor (sorafenib). Was reluctant to start around D+45-60 due to pancytopenia and changing chimerism. Following closely.     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    Conditioning:??  ????????????????????????Busulfan PK calculated from test dose 80 mg/hr daily and Fludarabine 40 mg/m2 daily ??day -5 thru day -2  ????????????????????????Day 0 -??02/24/16  Cell dose:??3.24 x10^8 TNC (Marrow)  Chimerisms/Marrow:  03/31/16: Day 30 marrow normocellular w/1% blasts. FISH and cytos are normal. MRD negative.   BM Chimerisms show 75% donor CD3 and >95% donor in unfractionated compartment.   04/22/16: PB Chimerisms show 71% donor CD3 and >95% donor unfractionated.   05/05/16: BM Chimerisms show 65% donor CD3 and >95% donor unfractionated.   05/18/16: PB Chimerisms show 74% donor CD3 and 95% donor unfractionated.   06/01/16: PB Chimerisms show 69% donor CD3 and >95% donor unfractionated.  06/22/16: PB Chimerisms show 80% donor CD3 and >95% donor unfractionated  06/22/16: BM Chimerisms show 79% donor CD3 and >95% donor unfractionated.   07/06/16: PB Chimerisms show 77% donor CD3 and >95% donor unfractionated.  07/13/16: Off tac x 4 weeks. Chimerism check next visit.  If no full chimerism at 8-10 weeks after discontinuation of tac will consider DLI. We do not have any donor cells stored. If we need to recollect for DLI, will consider mobilizing the donor and collecting CD3+ cells for DLI as well as CD34+ cells for another transplant.   07/26/16: Off tac almost 6 weeks. DNA chimerism shows PB CD3 has decreased to 74%, she remains full donor in unfractionated.  08/10/16: Off tac 8 weeks. Repeat DNA shows slight increase in CD3 to 79%. Plan after discussion with Dr. Oswald Hillock to re-collect sister for more donor cells for DLI. However, today with elevated LFTs, this is new, will need to follow these and make sure no GVHD prior to contacting sister for further donation. This was discussed with Christine [name] today.   08/18/16: Off tac 9 weeks. Repeat DNA pending.     GVHD Prophy: Mixed chimera.   - Methotrexate on day +1, 3, 6, 11 (received all 4 doses)  06/15/16: Tac tapered and eventually DC'd today.    08/18/16:  Has had no e/o GVHD,developed new elevated LFTs noted at visit on 7/31. This is isolated to AST/ALT and alk phos abnormality. Bilirubin is normal. LFTs seem to have slightly improved. She again today has no s/s of GVHD.  Liver US  today: 1.Patent hepatic vasculature with normal flow direction 2.Large simple hepatic cysts.3. Stone in gallbladder.      Heme:??  Transfusion criteria: * Pre-med with tylenol and Zyrtec pre- blood or platelet transfusion.   - Hgb <8 and Plt <10K or bleeding.   - Granix if ANC <0.5.     Hx of DVT:  - Completed 6 mos or anticoagulation.     ID:??  Prophylaxis:   - Continue Valtrex for Zoster prophy 500 mg qd.   - Bactrim stopped due to pancytopenia. Allergic to dapsone. Switched to Pentamidine (Dose #1 - 5/8).   06/30/16: Off IS. CD4 count 126. Continue monthly Pentamidine.   07/13/16: Inhaled pentamidine.   Due at next visit on 8/14. Scheduled for 3rd floor.     Viral studies:  - Weekly viral panels negative [7/3]. CMV/EBV pending today. Today 8/8 checking adenovirus and Hepatitis serologies.   ??  CV:   Hx of anthracycline induced cardiomyopathy:  - Nov 2017 w/EF 35%. This has now resolved with 01/2016 Echo showing estimated EF of 50%.   - Treated with lisinopril and metoprolol (Metoprolol 50mg  XL).   07/13/16 Now off all antihypertensives due to intolerance.     HTN: Lisinopril stopped d/t AKI.   04/07/16: D/C'd Norvasc. Due to asymptomatic hypotension. Pre-transplant blood pressures run in the 100/70-80 range per her report.   Per Dr. Barbette Merino, ok to stop metoprolol given low BPs with plan to repeat Echo in 6-8 weeks.  07/06/16: Baseline ECHO today showed EF of 45-50%. DC metoprolol.   07/13/16: Follow BP. Will repeat ECHO 8-12 weeks after stopping metoprolol. Scheduled for 10/26/2016    Pulm: DLCO adj: 85% ([facility])    GI/Hepatic:   - Completed VOD prophylaxis.     Elevated LFTs:  -Mild elevation at visit on 7/16  - 08/10/16 AST and ALT now 107/170 respectively. Normal amylase and lipase. No abdominal pain or GI symptoms.   -08/13/16   AST 191 and ALT 283. Alk phos 177.  - 08/18/16  AST 144 and ALT 264. Alk phos 218. Liver US: with patent hepatic vasculature notable for large simple hepatic cyst and gallstones    Bone Health:  03/31/16: DEXA scan showed osteoporosis of the spine and osteopenia of the femoral neck. Zometa and Ca/Vit D.   04/02/16: Received 1st dose of Zometa.   07/06/16. 2nd dose of Zometa. Completed. Continue Ca/Vit D supplement.     Summary:  - Off tac ~ 9 weeks now. No e/o GvHD, other than persistent elevation in LFTs, slightly decreased today. Liver US results as above.  -  DNA chimerism sent today.    - Last chimerism with CD3 at 79%, this is slight increase since she has been off tac now for several weeks. Discussed with Dr. Oswald Hillock plan was to move forward with re-collecting sister (donor) for DLI, however given new rise in LFTs will hold off on this for now. Can discuss plan with Christine [name] TNC once LFT issues resolve and ready to move forward.   - Viral studies pending: CMV, EBV, HHV-6, adenovirus, and HSV PCR from blood  -Will return to clinic next Tuesday 8/14 for follow up with Dr. Oswald Hillock. LFTs slightly decreased, will continue to monitor with labs since she is asymptomatic. If they should begin to trend up again would set up for liver biopsy to r/o liver GVHD as this may correlate with recent tacrolimus wean, although she has no other GVHD symptoms.   - At her next two visits  she will have restaging bone marrow biopsy on 8/14 and PFTs on 8/28 per protocol study follow up on CTN 1301 trial.         Almira Bar Adult Nurse Practitioner  08/18/2016  5:14 PM

## 2016-08-20 LAB — CMV VIRAL LD: Lab: NOT DETECTED

## 2016-08-20 LAB — CMV DNA, QUANTITATIVE, PCR: CMV VIRAL LD: NOT DETECTED

## 2016-08-20 LAB — EBV DNA DETECT/QUANT: Lab: NOT DETECTED

## 2016-08-24 ENCOUNTER — Ambulatory Visit
Admission: RE | Admit: 2016-08-24 | Discharge: 2016-08-24 | Disposition: A | Discharge status: Home / Self Care | Payer: BC Managed Care – PPO

## 2016-08-24 ENCOUNTER — Ambulatory Visit
Admission: RE | Admit: 2016-08-24 | Discharge: 2016-08-24 | Disposition: A | Discharge status: Home / Self Care | Payer: BC Managed Care – PPO | Attending: Family

## 2016-08-24 ENCOUNTER — Ambulatory Visit: Admission: RE | Admit: 2016-08-24 | Discharge: 2016-08-24 | Disposition: A | Discharge status: Home / Self Care

## 2016-08-24 DIAGNOSIS — Z9484 Stem cells transplant status: Secondary | ICD-10-CM

## 2016-08-24 DIAGNOSIS — I509 Heart failure, unspecified: Secondary | ICD-10-CM

## 2016-08-24 DIAGNOSIS — C9201 Acute myeloblastic leukemia, in remission: Principal | ICD-10-CM

## 2016-08-24 DIAGNOSIS — R945 Abnormal results of liver function studies: Secondary | ICD-10-CM

## 2016-08-24 DIAGNOSIS — Z9481 Bone marrow transplant status: Secondary | ICD-10-CM

## 2016-08-24 DIAGNOSIS — D899 Disorder involving the immune mechanism, unspecified: Secondary | ICD-10-CM

## 2016-08-24 DIAGNOSIS — I82492 Acute embolism and thrombosis of other specified deep vein of left lower extremity: Secondary | ICD-10-CM

## 2016-08-24 DIAGNOSIS — M810 Age-related osteoporosis without current pathological fracture: Secondary | ICD-10-CM

## 2016-08-24 LAB — COMPREHENSIVE METABOLIC PANEL
ALBUMIN: 4.2 g/dL (ref 3.5–5.0)
ALT (SGPT): 269 U/L — ABNORMAL HIGH (ref 15–48)
ANION GAP: 10 mmol/L (ref 9–15)
AST (SGOT): 146 U/L — ABNORMAL HIGH (ref 14–38)
BLOOD UREA NITROGEN: 11 mg/dL (ref 7–21)
BUN / CREAT RATIO: 16
CALCIUM: 9.7 mg/dL (ref 8.5–10.2)
CHLORIDE: 105 mmol/L (ref 98–107)
CO2: 27 mmol/L (ref 22.0–30.0)
CREATININE: 0.69 mg/dL (ref 0.60–1.00)
EGFR MDRD AF AMER: >=60 mL/min/{1.73_m2} (ref >=60)
GLUCOSE RANDOM: 92 mg/dL (ref 65–179)
POTASSIUM: 4.5 mmol/L (ref 3.5–5.0)
PROTEIN TOTAL: 6.6 g/dL (ref 6.5–8.3)
SODIUM: 142 mmol/L (ref 135–145)

## 2016-08-24 LAB — CMV DNA, QUANTITATIVE, PCR

## 2016-08-24 LAB — CBC W/ AUTO DIFF
BASOPHILS ABSOLUTE COUNT: 0 10*9/L (ref 0.0–0.1)
EOSINOPHILS ABSOLUTE COUNT: 0.1 10*9/L (ref 0.0–0.4)
HEMATOCRIT: 32.9 % — ABNORMAL LOW (ref 36.0–46.0)
HEMOGLOBIN: 10.9 g/dL — ABNORMAL LOW (ref 12.0–16.0)
LARGE UNSTAINED CELLS: 4 % (ref 0–4)
LYMPHOCYTES ABSOLUTE COUNT: 0.3 10*9/L — ABNORMAL LOW (ref 1.5–5.0)
MEAN CORPUSCULAR HEMOGLOBIN CONC: 33.2 g/dL (ref 31.0–37.0)
MEAN CORPUSCULAR HEMOGLOBIN: 33.8 pg (ref 26.0–34.0)
MEAN CORPUSCULAR VOLUME: 102 fL — ABNORMAL HIGH (ref 80.0–100.0)
MEAN PLATELET VOLUME: 6.5 fL — ABNORMAL LOW (ref 7.0–10.0)
MONOCYTES ABSOLUTE COUNT: 0.3 10*9/L (ref 0.2–0.8)
NEUTROPHILS ABSOLUTE COUNT: 2.3 10*9/L (ref 2.0–7.5)
PLATELET COUNT: 233 10*9/L (ref 150–440)
RED BLOOD CELL COUNT: 3.23 10*12/L — ABNORMAL LOW (ref 4.00–5.20)
RED CELL DISTRIBUTION WIDTH: 13.4 % (ref 12.0–15.0)

## 2016-08-24 LAB — RED CELL DISTRIBUTION WIDTH: Lab: 13.4

## 2016-08-24 LAB — CMV COMMENT: Lab: 0

## 2016-08-24 LAB — CREATININE: Creatinine:MCnc:Pt:Ser/Plas:Qn:: 0.69

## 2016-08-24 NOTE — Unmapped (Signed)
-   Please do not drive for 24 hours after the procedure if you received medications through your IV.    You may find you are sore for a couple of days following this procedure.    If you have discomfort, you may try a cold pack on for 15 min/off for 15 min. You may also take a dose of acetaminophen 650 mg orally if you need it.     - Keep the biopsy site dry and covered for 24 hours.     - After 24 hours, you may remove the pressure dressing and cover the site with a bandaid. Change the bandaid daily until the wound is healed [~ 3 days].     - Do not submerge in water [bath tub, hot tub, lake/ocean/pool] until the wound is healed.     - Call the clinic if you have:      ** bleeding from the site that does not stop after 10 minutes of firm pressure     ** redness and/or puss drainage from the site     ** fevers    BMT clinic: [phone]  Inpatient unit: [phone] (call this number if after hours)

## 2016-08-24 NOTE — Unmapped (Signed)
Discharged from infusion area ambulatory with no complications noted post bone marrow biopsy. Caregiver present.

## 2016-08-24 NOTE — Unmapped (Signed)
Pt arrived ambulatory in NAD to room 2 with her husband at her side.

## 2016-08-24 NOTE — Unmapped (Signed)
Date of Service: 08/24/2016    Clinician(s) Performing Procedure:  Ricka Burdock DNP, FNP-BC  BMT Attending: Lanae Boast, MD     Patient Active Problem List   Diagnosis   ??? Acute deep vein thrombosis (DVT) of left lower extremity (CMS-HCC)   ??? Osteoporosis (RAF-HCC)   ??? Acute myeloid leukemia (CMS-HCC)   ??? Heart failure (CMS-HCC)   ??? 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 LLE distal DVT  Chronic PE on 09/10/15 CTA   ??? Allogeneic stem cell transplant (RAF-HCC)   ??? Hypomagnesemia   ??? Immunocompromised state (CMS-HCC)   ??? Pancytopenia (CMS-HCC)     Vitals:    08/24/16 1423   BP: 121/89   Pulse: 91   Resp: 18   Temp: 36.5 ??C (97.7 ??F)     Lab Results   Component Value Date    WBC 3.2 (L) 08/24/2016    HGB 10.9 (L) 08/24/2016    HCT 32.9 (L) 08/24/2016    PLT 233 08/24/2016         I have reviewed all lab results which are normal or stable for the scheduled procedure. The patient is not currently on anticoagulation.    Bone Marrow Aspirate and Biopsy, right side    The procedure risks were explained to the patient.  The patient verbalized understanding and signed informed consent. After a time-out in which his patient identifiers were checked by 2 providers, the patient was laid in prone position on the table.   The posterior superior iliac spine and iliac crest were cleaned, prepped and draped in the usual sterile fashion.     Anesthetic/ sedation agent used: 2% plain lidocaine, 1mg  IV ativan for situational anxiety.      The aspirate needle was introduced in the prone posterior iliac crest and bone marrow aspirate was obtained, spicules were present.  Specimen was sent for routine histopathologic stains and sectioning, flow cytometry, cytogenetics and molecular analysis.  The Ranfrac needle was introduced into the posterior iliac crest and a core biopsy was obtained.    A pressure dressing was applied to the biopsy site and the patient was placed supine for 10 - 15 minutes to promote hemostasis. Patient tolerated the procedure well.  Hemostasis was confirmed upon discharge.  The patient was monitored for 30 minutes post procedure and was stable prior to discharge.    The patient was given verbal instructions including no driving for 24 hours if IV medications were used for the procedure, instructions for wound care, such as to keep the biopsy site dry, covered for 24 hours, and to call the clinic for a temperature > 100.4.  The patient was instructed to use prn pain medications if needed for pain relief.     The patient will return to clinic as discussed for follow-up care and to review the bone marrow biopsy results. They were instructed to call sooner with any concerns.

## 2016-08-24 NOTE — Unmapped (Signed)
BMT Clinic Follow-up Note    Patient Name: Christine [name]  Medical Record Number:  [id number]  Encounter Date: 08/24/2016    BMT Attending MD:  Lanae Boast, MD.     HPI: Christine [name] is a 61 y.o. 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 D +182 (=6 mo). Tac stop date 06/15/16 (2 months ago).      Interval History:  Christine [name] presents to clinic today with her husband and sister. Overall doing well and feeling well with no new complaints today. She has been off of tac for 9 weeks. She has no e/o GvHD [specifically rash, N/V/D ], however last week developed a significant increase in her LFTs. She is not taking any offending medications, new supplements, tylenol or drinking any alcohol. She denies any abdominal pain, discomfort or new GI issues. She had labs repeated at the end of last week and LFTs had continued to trend up, still with no GI complaints. Today her LFTs show a slight decrease.  Her PB chimerism had slight improvement, last week. Repeat has been drawn today. Her appetite is good. She is drinking well. She denies any SOB, CP, fever, chills, or infectious symptoms. She is scheduled for liver ultrasound this afternoon.     PMH, PSH, and PSocH: Reviewed and there are no changes.    Allergies:  Dapsone; Rasburicase; Vancomycin analogues; Adhesive; Cefepime; Morphine; and Penicillins   Medications: Reviewed and updated in EPIC.     Review of Systems:  A comprehensive review of systems was negative except for pertinent positives noted in HPI.    Objective:  VS: BP 135/72  - Pulse 88  - Temp 36.6 ??C (97.9 ??F) (Oral)  - Resp 16  - Wt 55.8 kg (123 lb)  - LMP  (LMP Unknown)  - SpO2 99%  - BMI 21.10 kg/m??     KPS: 80%, Normal activity with effort; some signs or symptoms of disease (ECOG equivalent 1)    Acute Graft versus Host Disease Assessment: 08/24/2016  Organ stage:    Skin stage 0, No Rash, none, 0 % BSA;   Upper GI stage 0, no nausea and no vomiting;   Lower GI stage 0, No Diarrhea;   Hepatic stage 0, Bilirubin normal - 2mg /dL    Overall grade (Current) None     Physical Exam: Unchanged from visit on 08/10/16  General appearance: No distress.    Head: Alopecia. Wears wig. Normal, non-tender sinuses.   Eyes: Conjunctivae and sclera clear.    Neck: Supple, no palpable LAD.   Lungs: Clear lungs, no wheeze, crackle, rhonchi.   Heart: RRR. Normal S1/S2. No murmur, rub, or gallop.  Abdomen: Soft, non-tender, normoactive BS, no organomegaly.   Extremities: No edema. Pulses palpable in all extremities.   MSK: No joint deformity, tenderness, or swelling.   Skin: Warm, dry, and intact. No rashes.   Line: R PAC is accessed, does not appear to be infected - non-tender, no erythema.     Test Results:  Lab Results   Component Value Date    WBC 3.2 (L) 08/24/2016    HGB 10.9 (L) 08/24/2016    HCT 32.9 (L) 08/24/2016    PLT 233 08/24/2016       Lab Results   Component Value Date    NA 142 08/24/2016    K 4.5 08/24/2016    CL 105 08/24/2016  CO2 27.0 08/24/2016    BUN 11 08/24/2016    CREATININE 0.69 08/24/2016    GLU 92 08/24/2016    CALCIUM 9.7 08/24/2016    MG 1.8 07/26/2016    PHOS 3.0 04/15/2016       Lab Results   Component Value Date    BILITOT 0.3 08/24/2016    BILIDIR <0.10 04/15/2016    PROT 6.6 08/24/2016    ALBUMIN 4.2 08/24/2016    ALT 269 (H) 08/24/2016    AST 146 (H) 08/24/2016    ALKPHOS 185 (H) 08/24/2016    GGT 115 (H) 08/10/2016     Assessment/Plan:  AML:??(FLT3-ITD, +11, and IDH2) in CR prior to transplant.   06/22/16: BMBx:   -  Normocellular bone marrow (40%) with trilineage hematopoiesis and 3% blasts.  -  Routine cytogenetics: donor karyotype; FISH (trisomy 11) results are normal.  -  Flow cytometric MRD results reveal no evidence of aberrant myeloid antigen expression or abnormal myeloblasts.  -  DNA Chimerism see below.  ??  - Consider maintenance with FLT3 inhibitor (sorafenib). Was reluctant to start around D+45-60 due to pancytopenia and changing chimerism. Following closely.     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    Conditioning:??  ????????????????????????Busulfan PK calculated from test dose 80 mg/hr daily and Fludarabine 40 mg/m2 daily ??day -5 thru day -2  ????????????????????????Day 0 -??02/24/16  Cell dose:??3.24 x10^8 TNC (Marrow)  Chimerisms/Marrow:  03/31/16: Day 30 marrow normocellular w/1% blasts. FISH and cytos are normal. MRD negative.   BM Chimerisms show 75% donor CD3 and >95% donor in unfractionated compartment.   04/22/16: PB Chimerisms show 71% donor CD3 and >95% donor unfractionated.   05/05/16: BM Chimerisms show 65% donor CD3 and >95% donor unfractionated.   05/18/16: PB Chimerisms show 74% donor CD3 and 95% donor unfractionated.   06/01/16: PB Chimerisms show 69% donor CD3 and >95% donor unfractionated.  06/22/16: PB Chimerisms show 80% donor CD3 and >95% donor unfractionated  06/22/16: BM Chimerisms show 79% donor CD3 and >95% donor unfractionated.   07/06/16: PB Chimerisms show 77% donor CD3 and >95% donor unfractionated.  07/13/16: Off tac x 4 weeks. Chimerism check next visit.  If no full chimerism at 8-10 weeks after discontinuation of tac will consider DLI. We do not have any donor cells stored. If we need to recollect for DLI, will consider mobilizing the donor and collecting CD3+ cells for DLI as well as CD34+ cells for another transplant.   07/26/16: Off tac almost 6 weeks. DNA chimerism shows PB CD3 has decreased to 74%, she remains full donor in unfractionated.  08/10/16: Off tac 8 weeks. Repeat DNA shows slight increase in CD3 to 79%. Plan after discussion with Dr. Oswald Hillock to re-collect sister for more donor cells for DLI. However, today with elevated LFTs, this is new, will need to follow these and make sure no GVHD prior to contacting sister for further donation. This was discussed with Pam today.   08/18/16: Off tac 9 weeks. PB Chimerisms show 85% donor CD3 and >95% donor unfractionated.  08/24/16: Off tac 10 weeks. BMBx today.     GVHD Prophy: Mixed chimera.   - Methotrexate on day +1, 3, 6, 11 (received all 4 doses)  06/15/16: Tac tapered and eventually DC'd today.    08/18/16:  Has had no e/o GVHD,developed new elevated LFTs noted at visit on 7/31. This is isolated to AST/ALT and alk phos  abnormality. Bilirubin is normal. LFTs seem to have slightly improved. She again today has no s/s of GVHD.  Liver US today: 1.Patent hepatic vasculature with normal flow direction 2.Large simple hepatic cysts.3. Stone in gallbladder.      Heme:??  Transfusion criteria: * Pre-med with tylenol and Zyrtec pre- blood or platelet transfusion.   - Hgb <8 and Plt <10K or bleeding.   - Granix if ANC <0.5.     Hx of DVT:  - Completed 6 mos or anticoagulation.     ID:??  Prophylaxis:   - Continue Valtrex for Zoster prophy 500 mg qd.   - Bactrim stopped due to pancytopenia. Allergic to dapsone. Switched to Pentamidine (Dose #1 - 5/8).   06/30/16: Off IS. CD4 count 126. Continue monthly Pentamidine.   07/13/16: Inhaled pentamidine.   08/24/16: Due today. Scheduled for 3rd floor.     Viral studies:  - Weekly viral panels negative [7/3]. CMV/EBV pending today. Today 8/8 checking adenovirus and Hepatitis serologies.   ??  CV:   Hx of anthracycline induced cardiomyopathy:  - Nov 2017 w/EF 35%. This has now resolved with 01/2016 Echo showing estimated EF of 50%.   - Treated with lisinopril and metoprolol (Metoprolol 50mg  XL).   07/13/16 Now off all antihypertensives due to intolerance.     HTN: Lisinopril stopped d/t AKI.   04/07/16: D/C'd Norvasc. Due to asymptomatic hypotension. Pre-transplant blood pressures run in the 100/70-80 range per her report.   Per Dr. Barbette Merino, ok to stop metoprolol given low BPs with plan to repeat Echo in 6-8 weeks.  07/06/16: Baseline ECHO today showed EF of 45-50%. DC metoprolol.   07/13/16: Follow BP. Will repeat ECHO 8-12 weeks after stopping metoprolol. Scheduled for 10/26/2016    Pulm: DLCO adj: 85% ([facility])    GI/Hepatic:   - Completed VOD prophylaxis.     Elevated LFTs:  -Mild elevation at visit on 7/16  - 08/10/16 AST and ALT now 107/170 respectively. Normal amylase and lipase. No abdominal pain or GI symptoms.   -08/13/16   AST 191 and ALT 283. Alk phos 177.  - 08/18/16  AST 144 and ALT 264. Alk phos 218. Liver US: with patent hepatic vasculature notable for large simple hepatic cyst and gallstones.  - 08/24/16: AST 146 and ALT 269. Stable.      Bone Health:  03/31/16: DEXA scan showed osteoporosis of the spine and osteopenia of the femoral neck. Zometa and Ca/Vit D.   04/02/16: Received 1st dose of Zometa.   07/06/16. 2nd dose of Zometa. Completed. Continue Ca/Vit D supplement.     Summary:  - Off tac ~ 9 weeks now. No e/o GvHD, other than persistent elevation in LFTs, seems to be stabilizing. Liver US results as above.  - BMBx with DNA chimerism today.     - Follow closely.       Willaim Bane Adult Nurse Practitioner  08/24/2016  10:50 AM

## 2016-08-24 NOTE — Unmapped (Addendum)
Please check CBC locally on 08/31/16.   Please call with any new problems.

## 2016-08-24 NOTE — Unmapped (Signed)
Pt arrived ambulatory in NAD to room 8.  Medication order released at 1101 1222 Albuterol inhalation administered. 1234 Pentamidine inhalation administered.  1254 Pt left infusion area ambulatory in NAD

## 2016-08-26 LAB — EBV DNA DETECT/QUANT: Lab: NOT DETECTED

## 2016-08-31 ENCOUNTER — Ambulatory Visit
Admission: RE | Admit: 2016-08-31 | Discharge: 2016-08-31 | Disposition: A | Discharge status: Home / Self Care | Payer: BC Managed Care – PPO

## 2016-08-31 DIAGNOSIS — Z9484 Stem cells transplant status: Principal | ICD-10-CM

## 2016-08-31 LAB — COMPREHENSIVE METABOLIC PANEL
ALBUMIN: 4.1 g/dL (ref 3.5–5.0)
ALKALINE PHOSPHATASE: 233 U/L — ABNORMAL HIGH (ref 38–126)
ALT (SGPT): 259 U/L — ABNORMAL HIGH (ref 15–48)
ANION GAP: 7 mmol/L — ABNORMAL LOW (ref 9–15)
AST (SGOT): 147 U/L — ABNORMAL HIGH (ref 14–38)
BILIRUBIN TOTAL: 0.3 mg/dL (ref 0.0–1.2)
BLOOD UREA NITROGEN: 12 mg/dL (ref 7–21)
BUN / CREAT RATIO: 15
CALCIUM: 10.2 mg/dL (ref 8.5–10.2)
CO2: 29 mmol/L (ref 22.0–30.0)
EGFR MDRD AF AMER: >=60 mL/min/{1.73_m2} (ref >=60)
EGFR MDRD NON AF AMER: >=60 mL/min/{1.73_m2} (ref >=60)
GLUCOSE RANDOM: 88 mg/dL (ref 65–179)
POTASSIUM: 4.6 mmol/L (ref 3.5–5.0)
PROTEIN TOTAL: 6.5 g/dL (ref 6.5–8.3)
SODIUM: 141 mmol/L (ref 135–145)

## 2016-08-31 LAB — CHLORIDE: Chloride:SCnc:Pt:Ser/Plas:Qn:: 105

## 2016-08-31 NOTE — Unmapped (Signed)
Patient called for LFT results drawn in [place].  She denies any new complaints or concerns.

## 2016-09-02 MED FILL — VALACYCLOVIR/500MG/TAB: VALACYCLOVIR/500MG/TAB | 30 days supply | Qty: 30 | Fill #6

## 2016-09-07 ENCOUNTER — Ambulatory Visit
Admission: RE | Admit: 2016-09-07 | Discharge: 2016-09-07 | Disposition: A | Discharge status: Home / Self Care | Payer: BC Managed Care – PPO

## 2016-09-07 ENCOUNTER — Ambulatory Visit
Admission: RE | Admit: 2016-09-07 | Discharge: 2016-09-07 | Disposition: A | Discharge status: Home / Self Care | Payer: BC Managed Care – PPO | Attending: Pharmacist Clinician (PhC)/ Clinical Pharmacy Specialist | Admitting: Pharmacist Clinician (PhC)/ Clinical Pharmacy Specialist

## 2016-09-07 ENCOUNTER — Ambulatory Visit: Admission: RE | Admit: 2016-09-07 | Discharge: 2016-09-07 | Disposition: A | Discharge status: Home / Self Care

## 2016-09-07 DIAGNOSIS — Z9484 Stem cells transplant status: Secondary | ICD-10-CM

## 2016-09-07 DIAGNOSIS — C9201 Acute myeloblastic leukemia, in remission: Principal | ICD-10-CM

## 2016-09-07 DIAGNOSIS — Z23 Encounter for immunization: Principal | ICD-10-CM

## 2016-09-07 DIAGNOSIS — Z9481 Bone marrow transplant status: Secondary | ICD-10-CM

## 2016-09-07 LAB — COMPREHENSIVE METABOLIC PANEL
ALBUMIN: 4.3 g/dL (ref 3.5–5.0)
ALKALINE PHOSPHATASE: 197 U/L — ABNORMAL HIGH (ref 38–126)
ALT (SGPT): 242 U/L — ABNORMAL HIGH (ref 15–48)
ANION GAP: 10 mmol/L (ref 9–15)
AST (SGOT): 136 U/L — ABNORMAL HIGH (ref 14–38)
BILIRUBIN TOTAL: 0.5 mg/dL (ref 0.0–1.2)
BLOOD UREA NITROGEN: 10 mg/dL (ref 7–21)
BUN / CREAT RATIO: 14
CALCIUM: 10 mg/dL (ref 8.5–10.2)
CHLORIDE: 104 mmol/L (ref 98–107)
CO2: 27 mmol/L (ref 22.0–30.0)
CREATININE: 0.71 mg/dL (ref 0.60–1.00)
EGFR MDRD AF AMER: >=60 mL/min/{1.73_m2} (ref >=60)
EGFR MDRD NON AF AMER: >=60 mL/min/{1.73_m2} (ref >=60)
POTASSIUM: 4.8 mmol/L (ref 3.5–5.0)
PROTEIN TOTAL: 6.8 g/dL (ref 6.5–8.3)
SODIUM: 141 mmol/L (ref 135–145)

## 2016-09-07 LAB — AST (SGOT): Aspartate aminotransferase:CCnc:Pt:Ser/Plas:Qn:: 136 — ABNORMAL HIGH

## 2016-09-07 LAB — CBC W/ AUTO DIFF
EOSINOPHILS ABSOLUTE COUNT: 0.2 10*9/L (ref 0.0–0.4)
HEMATOCRIT: 33.9 % — ABNORMAL LOW (ref 36.0–46.0)
HEMOGLOBIN: 11.2 g/dL — ABNORMAL LOW (ref 12.0–16.0)
LARGE UNSTAINED CELLS: 4 % (ref 0–4)
LYMPHOCYTES ABSOLUTE COUNT: 0.5 10*9/L — ABNORMAL LOW (ref 1.5–5.0)
MEAN CORPUSCULAR HEMOGLOBIN CONC: 33.2 g/dL (ref 31.0–37.0)
MEAN CORPUSCULAR HEMOGLOBIN: 34.8 pg — ABNORMAL HIGH (ref 26.0–34.0)
MEAN CORPUSCULAR VOLUME: 104.7 fL — ABNORMAL HIGH (ref 80.0–100.0)
MEAN PLATELET VOLUME: 8.4 fL (ref 7.0–10.0)
MONOCYTES ABSOLUTE COUNT: 0.3 10*9/L (ref 0.2–0.8)
PLATELET COUNT: 209 10*9/L (ref 150–440)
RED CELL DISTRIBUTION WIDTH: 13.6 % (ref 12.0–15.0)
WBC ADJUSTED: 3.1 10*9/L — ABNORMAL LOW (ref 4.5–11.0)

## 2016-09-07 LAB — NEUTROPHILS ABSOLUTE COUNT: Lab: 1.9 — ABNORMAL LOW

## 2016-09-07 LAB — GAMMAGLOBULIN; IGG: IgG:MCnc:Pt:Ser/Plas:Qn:: 700

## 2016-09-07 LAB — SMEAR REVIEW

## 2016-09-07 NOTE — Unmapped (Signed)
BMT Clinic Follow-up Note    Patient Name: Christine [name]  Medical Record Number:  [id number]  Encounter Date: 09/07/2016    BMT Attending MD:  Lanae Boast, MD.     HPI: Christine [name] is a 61 y.o. 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 D +196 (past 6 mo). Tac stop date 06/15/16 (3 months ago).      Interval History:  Christine [name] presents to clinic today with her husband and sister. Overall doing well and feeling well with no new complaints today. She has been off tac for 3 months. She has no e/o GvHD [specifically rash, N/V/D]. However around 7/31 developed significant increase in LFTs. She is not taking any offending medications, new supplements, tylenol or drinking any alcohol. She denies any abdominal pain, discomfort or new GI issues.   Since then the LFT stabilized and perhaps started to decrease.   Repeat chimerism studies drawn today.   Her appetite is good. She is drinking well. She denies any SOB, CP, fever, chills, or infectious symptoms.    PMH, PSH, and PSocH: Reviewed and there are no changes.    Allergies:  Dapsone; Rasburicase; Vancomycin analogues; Adhesive; Cefepime; Morphine; and Penicillins   Medications: Reviewed and updated in EPIC.     Review of Systems:  A comprehensive review of systems was negative except for pertinent positives noted in HPI.    Objective:  VS: BP 106/70  - Pulse 83  - Temp 36.7 ??C (98.1 ??F) (Oral)  - Resp 18  - Ht 162.6 cm (5' 4.02)  - Wt 55.8 kg (123 lb 0.3 oz)  - LMP  (LMP Unknown)  - SpO2 100%  - BMI 21.11 kg/m??     KPS: 80%, Normal activity with effort; some signs or symptoms of disease (ECOG equivalent 1)    Acute Graft versus Host Disease Assessment: 09/07/2016  Organ stage:    Skin stage 0, No Rash, none, 0 % BSA;   Upper GI stage 0, no nausea and no vomiting;   Lower GI stage 0, No Diarrhea;   Hepatic stage 0, Bilirubin normal - 2mg /dL    Overall grade (Current) None     Physical Exam: Unchanged from visit on 08/10/16  General appearance: No distress.    Head: Alopecia. Wears wig. Normal, non-tender sinuses.   Eyes: Conjunctivae and sclera clear.    Neck: Supple, no palpable LAD.   Lungs: Clear lungs, no wheeze, crackle, rhonchi.   Heart: RRR. Normal S1/S2. No murmur, rub, or gallop.  Abdomen: Soft, non-tender, normoactive BS, no organomegaly.   Extremities: No edema. Pulses palpable in all extremities.   MSK: No joint deformity, tenderness, or swelling.   Skin: Warm, dry, and intact. No rashes.   Line: R PAC is accessed, does not appear to be infected - non-tender, no erythema.     Test Results:  Lab Results   Component Value Date    WBC 3.1 (L) 09/07/2016    HGB 11.2 (L) 09/07/2016    HCT 33.9 (L) 09/07/2016    PLT 209 09/07/2016       Lab Results   Component Value Date    NA 141 09/07/2016    K 4.8 09/07/2016    CL 104 09/07/2016    CO2 27.0 09/07/2016    BUN 10 09/07/2016    CREATININE 0.71 09/07/2016    GLU 81 09/07/2016    CALCIUM  10.0 09/07/2016    MG 1.8 07/26/2016    PHOS 3.0 04/15/2016       Lab Results   Component Value Date    BILITOT 0.5 09/07/2016    BILIDIR <0.10 04/15/2016    PROT 6.8 09/07/2016    ALBUMIN 4.3 09/07/2016    ALT 242 (H) 09/07/2016    AST 136 (H) 09/07/2016    ALKPHOS 197 (H) 09/07/2016    GGT 115 (H) 08/10/2016     Assessment/Plan:  AML:??(FLT3-ITD, +11, and IDH2) in CR prior to transplant.   06/22/16: BMBx:   -  Normocellular bone marrow (40%) with trilineage hematopoiesis and 3% blasts.  -  Routine cytogenetics: donor karyotype; FISH (trisomy 11) results are normal.  -  Flow cytometric MRD results reveal no evidence of aberrant myeloid antigen expression or abnormal myeloblasts.  -  DNA Chimerism see below.  ??  - Consider maintenance with FLT3 inhibitor (sorafenib). Was reluctant to start around D+45-60 due to pancytopenia and changing chimerism. Following closely.     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    Conditioning:??  ????????????????????????Busulfan PK calculated from test dose 80 mg/hr daily and Fludarabine 40 mg/m2 daily ??day -5 thru day -2  ????????????????????????Day 0 -??02/24/16  Cell dose:??3.24 x10^8 TNC (Marrow)  Chimerisms/Marrow:  03/31/16: Day 30 marrow normocellular w/1% blasts. FISH and cytos are normal. MRD negative.   BM Chimerisms show 75% donor CD3 and >95% donor in unfractionated compartment.   04/22/16: PB Chimerisms show 71% donor CD3 and >95% donor unfractionated.   05/05/16: BM Chimerisms show 65% donor CD3 and >95% donor unfractionated.   05/18/16: PB Chimerisms show 74% donor CD3 and 95% donor unfractionated.   06/01/16: PB Chimerisms show 69% donor CD3 and >95% donor unfractionated.  06/22/16: PB Chimerisms show 80% donor CD3 and >95% donor unfractionated  06/22/16: BM Chimerisms show 79% donor CD3 and >95% donor unfractionated.   07/06/16: PB Chimerisms show 77% donor CD3 and >95% donor unfractionated.  07/13/16: Off tac x 4 weeks. Chimerism check next visit.  If no full chimerism at 8-10 weeks after discontinuation of tac will consider DLI. We do not have any donor cells stored. If we need to recollect for DLI, will consider mobilizing the donor and collecting CD3+ cells for DLI as well as CD34+ cells for another transplant.   07/26/16: Off tac almost 6 weeks. DNA chimerism shows PB CD3 has decreased to 74%, she remains full donor in unfractionated.  08/10/16: Off tac 8 weeks. Repeat DNA shows slight increase in CD3 to 79%. Plan after discussion with Dr. Oswald Hillock to re-collect sister for more donor cells for DLI. However, today with elevated LFTs, this is new, will need to follow these and make sure no GVHD prior to contacting sister for further donation. This was discussed with Christine [name] today.   08/18/16: Off tac 9 weeks. PB Chimerisms show 85% donor CD3 and >95% donor unfractionated.  08/24/16: Off tac 10 weeks. BM Chimerisms show 79% donor CD3 and >95% donor unfractionated. 09/07/16: Off tac 12 weeks. PB Chimerisms show 91% donor CD3 and >95% donor unfractionated     GVHD Prophy: Mixed chimera.   - Methotrexate on day +1, 3, 6, 11 (received all 4 doses)  06/15/16: Tac tapered and eventually DC'd today.    08/18/16:  Has had no e/o GVHD,developed new elevated LFTs noted at visit on 7/31. This is isolated to AST/ALT and alk phos abnormality. Bilirubin  is normal. LFTs seem to have slightly improved. She again today has no s/s of GVHD.  Liver US today: 1.Patent hepatic vasculature with normal flow direction 2.Large simple hepatic cysts.3. Stone in gallbladder.      Heme:??  Transfusion criteria: * Pre-med with tylenol and Zyrtec pre- blood or platelet transfusion.   - Hgb <8 and Plt <10K or bleeding.   - Granix if ANC <0.5.     Hx of DVT:  - Completed 6 mos or anticoagulation.     ID:??  Prophylaxis:   - Continue Valtrex for Zoster prophy 500 mg qd.   - Bactrim stopped due to pancytopenia. Allergic to dapsone. Switched to Pentamidine (Dose #1 - 5/8).   06/30/16: Off IS. CD4 count 126. Continue monthly Pentamidine.   08/24/16: Inhaled pentamidine.   09/07/16: CD4 count 137. Continue inhaled pentamidine.      Viral studies:  - Weekly viral panels negative [7/3]. CMV/EBV pending today. Today 8/8 checking adenovirus and Hepatitis serologies.   ??  CV:   Hx of anthracycline induced cardiomyopathy:  - Nov 2017 w/EF 35%. This has now resolved with 01/2016 Echo showing estimated EF of 50%.   - Treated with lisinopril and metoprolol (Metoprolol 50mg  XL).   07/13/16 Now off all antihypertensives due to intolerance.     HTN: Lisinopril stopped d/t AKI.   04/07/16: D/C'd Norvasc. Due to asymptomatic hypotension. Pre-transplant blood pressures run in the 100/70-80 range per her report.   Per Dr. Barbette Merino, ok to stop metoprolol given low BPs with plan to repeat Echo in 6-8 weeks.  07/06/16: Baseline ECHO today showed EF of 45-50%. DC metoprolol.   07/13/16: Follow BP. Will repeat ECHO 8-12 weeks after stopping metoprolol. Scheduled for 10/26/2016    Pulm: DLCO adj: 85% (Dinkara)    GI/Hepatic:   - Completed VOD prophylaxis.     Elevated LFTs:  -Mild elevation at visit on 7/16  - 08/10/16 AST and ALT now 107/170 respectively. Normal amylase and lipase. No abdominal pain or GI symptoms.   - 08/13/16   AST 191 and ALT 283. Alk phos 177.  - 08/18/16  AST 144 and ALT 264. Alk phos 218. Liver US: with patent hepatic vasculature notable for large simple hepatic cyst and gallstones.  - 09/07/16: AST 136 and ALT 242. Alk phos 197. Stable.     Bone Health:  03/31/16: DEXA scan showed osteoporosis of the spine and osteopenia of the femoral neck. Zometa and Ca/Vit D.   04/02/16: Received 1st dose of Zometa.   07/06/16. 2nd dose of Zometa. Completed. Continue Ca/Vit D supplement.     Summary:  - Off tac ~ 12 weeks now. No e/o GvHD, other than persistent elevation in LFTs, slightly decreased today. Liver US results as above.  - DNA chimerism today improved. Follow closely.           Christine [name]  09/07/2016  12:09 PM

## 2016-09-07 NOTE — Unmapped (Signed)
Immunizations administered per orders. Patient provided with information regarding side effects and what symptoms to expect after vaccines. Patient verbalized understanding, time spent 5 minutes.

## 2016-09-07 NOTE — Unmapped (Signed)
Pharmacy - Post-transplant Vaccines Visit    Patient Name/ID: Christine [name]  MRN: [id number]  DOB: 01-31-1955    Encounter Date: 09/07/2016    BMT Attending MD: Dr. Oswald Hillock    Patient/Caregiver Phone:  [phone] (home)     Interval History:    Christine [name] is a 61 y.o. YO female with a h/o AML (FLT3-ITD and IDH2+) who is now day +196 s/p f/b allogeneic stem cell transplant conditioned with busulfan/fludarabine. she is presenting to clinic for her 31-month follow up visit and I am seeing her today to discuss initiation of her post-transplant vaccinations.   ??  Vaccine Discussion and Recommendation:  ??  I discussed the rationale for post-transplant vaccines and the goal of restoring immunity to vaccine-preventable diseases and infections that may have been lost following her stem cell transplant. We discussed each specific vaccine that she will be getting and the timeline/schedule for when they will be due.     The vaccine schedule will be as follows:  ??    Vaccination 6 months 12 months 18 months 24 months Comments   ??  Inactivated Influenza Vaccine (IIV) X ?? ?? ?? Should receive annually starting 6 months post-transplant   ??  DTaP Given today X X ?? Administered as Pediarix triple vaccine   ??  Hep B Given today X X ?? Administered as Pediarix triple vaccine   Haemophilus influenza type B  (Hib) Given today X X ?? ??   Inactivated polio vaccine (IPV) Given today X X ?? Administered as Pediarix triple vaccine   Pneumoccocal conjugate vaccine 13-valent (PCV 13) Given today X X ?? ??   Adjuvanted herpes zoster vaccine  (Shingrix)  ??X ??X ??    Pneumococcal polysaccharide vaccine 23-valent (PPSV23) ?? ?? ?? X ??   Measles, Mumps, Rubella (MMR) ?? ?? ?? X      ??  Today, Christine [name] received her 6- month vaccines as noted above. We also discussed that she should receive seasonal flu shot when they become available at the end of September.   ??  I reviewed the difference between inactivated vaccines and the live attenuated vaccines and why they are given at the time points that they are, allowing for adequate immune reconstitution. I also reviewed that the timing for these vaccines line up with when she should be returning to the BMT clinic for her routine post-transplant f/u visits. Thus, she should plan to get all vaccines in the [facility] BMT clinic so that we can assure she is getting all necessary post-transplant vaccines.  ??  We also discussed the side effects associated with vaccines including potential injection site reactions (pain, redness, swelling) or fevers. If these occur they are typically transient and can be managed symptomatically.  ??  I completed a medication review and I discussed the need to continue to monitor her medications as some medications (including IV) may warrant a delay in vaccine administration. she will update [place] about any new oral medications or infusions that she has received at each visit so that we can adjust her vaccine schedule accordingly.  ??  I provided her with a vaccine card to track administration of each of the required post-transplant vaccines. While vaccine administration will be documented in his [facility] record, I have asked that she bring this vaccine sheet to her clinic appointments so that we can confirm what she has already received in the case that she does end up getting any vaccines at  an outside doctor's office  or in a different [facility] clinic.  ??  Christine [name] verbalized understanding of the above.  ??  **f/u:  in 6 months; at that time she will be due for her 43-month vaccines.  ??  I spent 10 minutes in direct patient care with this patient reviewing the information above.    Rulon Abide, PharmD, BCOP  Clinical Pharmacist Practitioner, BMT    I reviewed the note and agree with the above.    Lanae Boast, MD

## 2016-09-08 LAB — LYMPH MARKER LIMITED,FLOW
ABSOLUTE CD3 CNT: 243 {cells}/uL — ABNORMAL LOW (ref 915–3400)
ABSOLUTE CD8 CNT: 100 {cells}/uL — ABNORMAL LOW (ref 180–1520)
CD3% (T CELLS)": 39 % — ABNORMAL LOW (ref 61–86)
CD4% (T HELPER)": 22 % — ABNORMAL LOW (ref 34–58)
CD8% T SUPPRESR": 16 % (ref 12–38)

## 2016-09-08 LAB — CMV QUANT LOG10: Lab: 0

## 2016-09-08 LAB — CD3% (T CELLS)": Lab: 39 — ABNORMAL LOW

## 2016-09-08 LAB — CMV DNA, QUANTITATIVE, PCR

## 2016-09-10 LAB — EBV DNA DETECT/QUANT: Lab: NOT DETECTED

## 2016-09-26 MED FILL — VALACYCLOVIR/500MG/TAB: VALACYCLOVIR/500MG/TAB | 30 days supply | Qty: 30 | Fill #7

## 2016-09-28 ENCOUNTER — Ambulatory Visit
Admission: RE | Admit: 2016-09-28 | Discharge: 2016-09-28 | Disposition: A | Discharge status: Home / Self Care | Payer: BC Managed Care – PPO

## 2016-09-28 ENCOUNTER — Ambulatory Visit: Admission: RE | Admit: 2016-09-28 | Discharge: 2016-09-28 | Disposition: A | Discharge status: Home / Self Care

## 2016-09-28 DIAGNOSIS — C9201 Acute myeloblastic leukemia, in remission: Principal | ICD-10-CM

## 2016-09-28 DIAGNOSIS — M810 Age-related osteoporosis without current pathological fracture: Secondary | ICD-10-CM

## 2016-09-28 DIAGNOSIS — D72819 Decreased white blood cell count, unspecified: Secondary | ICD-10-CM

## 2016-09-28 DIAGNOSIS — Z9484 Stem cells transplant status: Secondary | ICD-10-CM

## 2016-09-28 DIAGNOSIS — D899 Disorder involving the immune mechanism, unspecified: Secondary | ICD-10-CM

## 2016-09-28 DIAGNOSIS — Z9481 Bone marrow transplant status: Secondary | ICD-10-CM

## 2016-09-28 LAB — CBC W/ AUTO DIFF
BASOPHILS ABSOLUTE COUNT: 0 10*9/L (ref 0.0–0.1)
HEMATOCRIT: 33.7 % — ABNORMAL LOW (ref 36.0–46.0)
HEMOGLOBIN: 11.6 g/dL — ABNORMAL LOW (ref 12.0–16.0)
LARGE UNSTAINED CELLS: 2 % (ref 0–4)
LYMPHOCYTES ABSOLUTE COUNT: 0.5 10*9/L — ABNORMAL LOW (ref 1.5–5.0)
MEAN CORPUSCULAR HEMOGLOBIN CONC: 34.5 g/dL (ref 31.0–37.0)
MEAN CORPUSCULAR HEMOGLOBIN: 35.5 pg — ABNORMAL HIGH (ref 26.0–34.0)
MEAN CORPUSCULAR VOLUME: 102.9 fL — ABNORMAL HIGH (ref 80.0–100.0)
MEAN PLATELET VOLUME: 7.1 fL (ref 7.0–10.0)
MONOCYTES ABSOLUTE COUNT: 0.3 10*9/L (ref 0.2–0.8)
NEUTROPHILS ABSOLUTE COUNT: 2.3 10*9/L (ref 2.0–7.5)
PLATELET COUNT: 216 10*9/L (ref 150–440)
RED BLOOD CELL COUNT: 3.28 10*12/L — ABNORMAL LOW (ref 4.00–5.20)
WBC ADJUSTED: 3.4 10*9/L — ABNORMAL LOW (ref 4.5–11.0)

## 2016-09-28 LAB — COMPREHENSIVE METABOLIC PANEL
ALBUMIN: 4.2 g/dL (ref 3.5–5.0)
ALKALINE PHOSPHATASE: 128 U/L — ABNORMAL HIGH (ref 38–126)
ALT (SGPT): 159 U/L — ABNORMAL HIGH (ref 15–48)
ANION GAP: 12 mmol/L (ref 9–15)
AST (SGOT): 91 U/L — ABNORMAL HIGH (ref 14–38)
BILIRUBIN TOTAL: 0.5 mg/dL (ref 0.0–1.2)
BLOOD UREA NITROGEN: 12 mg/dL (ref 7–21)
BUN / CREAT RATIO: 18
CALCIUM: 9.9 mg/dL (ref 8.5–10.2)
CHLORIDE: 102 mmol/L (ref 98–107)
CREATININE: 0.67 mg/dL (ref 0.60–1.00)
EGFR MDRD AF AMER: >=60 mL/min/{1.73_m2} (ref >=60)
EGFR MDRD NON AF AMER: >=60 mL/min/{1.73_m2} (ref >=60)
GLUCOSE RANDOM: 85 mg/dL (ref 65–179)
SODIUM: 142 mmol/L (ref 135–145)

## 2016-09-28 LAB — EGFR MDRD NON AF AMER: Glomerular filtration rate/1.73 sq M.predicted.non black:ArVRat:Pt:Ser/Plas/Bld:Qn:Creatinine-based formula (MDRD): >=60

## 2016-09-28 LAB — BASOPHILS ABSOLUTE COUNT: Lab: 0

## 2016-09-28 NOTE — Unmapped (Signed)
BMT Clinic Follow-up Note    Patient Name: Christine [name]  Medical Record Number:  [id number]  Encounter Date: 09/28/2016    BMT Attending MD:  Lanae Boast, MD.     HPI: Sylwia Cuervo Dupree is a 61 y.o. 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 D +216 days. Tac stop date 06/15/16 (3 months ago).      Interval History:  Mrs. Beavers is feeling well today with no new complaints. Her energy has been good. She denies any nausea, vomiting, or diarrhea. She has no new rashes. She denies any fever, chills, SOB, chest pain, bleeding, bruising, or infectious symptoms. She continues to have a good appetite and her weight is stable today. She is drinking well and her renal function is good today.     PMH, PSH, and PSocH: Reviewed and there are no changes.     Allergies:  Dapsone; Rasburicase; Vancomycin analogues; Adhesive; Cefepime; Morphine; and Penicillins   Medications: Reviewed and updated in EPIC.     Review of Systems:  A comprehensive ROS is negative except for as mentioned above in HPI.     Objective:  VS:BP 103/59  - Pulse 82  - Temp 36.6 ??C (97.8 ??F) (Oral)  - Resp 16  - Wt 56 kg (123 lb 6.4 oz)  - LMP  (LMP Unknown)  - SpO2 100%  - BMI 21.17 kg/m??     KPS: 80%, Normal activity with effort; some signs or symptoms of disease (ECOG equivalent 1)    Acute Graft versus Host Disease Assessment: 09/28/2016  Organ stage: 0  Skin stage 0, No Rash, none, 0 % BSA;   Upper GI stage 0, no nausea and no vomiting;   Lower GI stage 0, No Diarrhea;   Hepatic stage 0, Bilirubin normal - 2mg /dL    Overall grade (Current) None     Physical Exam:   General appearance: Well appearing female in no acute distress.   Head: Alopecia, wears wig. Normal, non-tender sinuses.  Eyes: PERRL, EOMs intact, conjunctiva and sclera clear.   Neck: Supple, no palpable LAD  Lungs: CTAB. No wheezes, crackles, or rhonchi.   Heart: RRR. Normal s1/s2. No murmur, rub, or gallop.  Abdomen: Soft, non-tender, normoactive BS, no organomegaly.   Extremities: No edema. Pulses palpable in all extremities.   MSK: No joint deformity, tenderness, or swelling. FROM  Skin: Warm, dry, and intact. No rashes.   Line: R PAC accessed. Non-tender and no notable erythema.     Test Results:  Lab Results   Component Value Date    WBC 3.1 (L) 09/07/2016    HGB 11.2 (L) 09/07/2016    HCT 33.9 (L) 09/07/2016    PLT 209 09/07/2016       Lab Results   Component Value Date    NA 141 09/07/2016    K 4.8 09/07/2016    CL 104 09/07/2016    CO2 27.0 09/07/2016    BUN 10 09/07/2016    CREATININE 0.71 09/07/2016    GLU 81 09/07/2016    CALCIUM 10.0 09/07/2016    MG 1.8 07/26/2016    PHOS 3.0 04/15/2016       Lab Results   Component Value Date    BILITOT 0.5 09/07/2016    BILIDIR <0.10 04/15/2016    PROT 6.8 09/07/2016    ALBUMIN 4.3 09/07/2016    ALT 242 (H) 09/07/2016  AST 136 (H) 09/07/2016    ALKPHOS 197 (H) 09/07/2016    GGT 115 (H) 08/10/2016     Assessment/Plan:  AML:??(FLT3-ITD, +11, and IDH2) in CR prior to transplant.   06/22/16: BMBx:   -  Normocellular bone marrow (40%) with trilineage hematopoiesis and 3% blasts.  -  Routine cytogenetics: donor karyotype; FISH (trisomy 11) results are normal.  -  Flow cytometric MRD results reveal no evidence of aberrant myeloid antigen expression or abnormal myeloblasts.  -  DNA Chimerism see below.  ??  - Consider maintenance with FLT3 inhibitor (sorafenib). Was reluctant to start around D+45-60 due to pancytopenia and changing chimerism. Following closely.     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    Conditioning:??  ????????????????????????Busulfan PK calculated from test dose 80 mg/hr daily and Fludarabine 40 mg/m2 daily ??day -5 thru day -2  ????????????????????????Day 0 -??02/24/16  Cell dose:??3.24 x10^8 TNC (Marrow)  Chimerisms/Marrow:  03/31/16: Day 30 marrow normocellular w/1% blasts. FISH and cytos are normal. MRD negative.   BM Chimerisms show 75% donor CD3 and >95% donor in unfractionated compartment.   04/22/16: PB Chimerisms show 71% donor CD3 and >95% donor unfractionated.   05/05/16: BM Chimerisms show 65% donor CD3 and >95% donor unfractionated.   05/18/16: PB Chimerisms show 74% donor CD3 and 95% donor unfractionated.   06/01/16: PB Chimerisms show 69% donor CD3 and >95% donor unfractionated.  06/22/16: PB Chimerisms show 80% donor CD3 and >95% donor unfractionated  06/22/16: BM Chimerisms show 79% donor CD3 and >95% donor unfractionated.   07/06/16: PB Chimerisms show 77% donor CD3 and >95% donor unfractionated.  07/13/16: Off tac x 4 weeks. Chimerism check next visit.  If no full chimerism at 8-10 weeks after discontinuation of tac will consider DLI. We do not have any donor cells stored. If we need to recollect for DLI, will consider mobilizing the donor and collecting CD3+ cells for DLI as well as CD34+ cells for another transplant.   07/26/16: Off tac almost 6 weeks. DNA chimerism shows PB CD3 has decreased to 74%, she remains full donor in unfractionated.  08/10/16: Off tac 8 weeks. Repeat DNA shows slight increase in CD3 to 79%. Plan after discussion with Dr. Oswald Hillock to re-collect sister for more donor cells for DLI. However, today with elevated LFTs, this is new, will need to follow these and make sure no GVHD prior to contacting sister for further donation. This was discussed with Pam today.   08/18/16: Off tac 9 weeks. PB Chimerisms show 85% donor CD3 and >95% donor unfractionated.  08/24/16: Off tac 10 weeks. BM Chimerisms show 79% donor CD3 and >95% donor unfractionated.  09/07/16: Off tac 12 weeks. PB Chimerisms show 91% donor CD3 and >95% donor unfractionated   09/28/16: Off tac 15 weeks. PB chimerism pending today.      GVHD Prophy: Mixed chimera.   - Methotrexate on day +1, 3, 6, 11 (received all 4 doses)  - Tac tapered and eventually DC'd 06/15/16    -  Has had no e/o GVHD,developed new elevated LFTs noted at visit on 7/31. Liver US(08/18/16): 1.Patent hepatic vasculature with normal flow direction 2.Large simple hepatic cysts.3. Stone in gallbladder.   - 09/28/16: LFTs continue to improve.     Heme:??  Transfusion criteria: * Pre-med with tylenol and Zyrtec pre- blood or platelet transfusion.   - Hgb <8 and Plt <10K or bleeding.   - Granix if  ANC <0.5.     Hx of DVT:  - Completed 6 mos or anticoagulation.     ID:??  Prophylaxis:   - Continue Valtrex for Zoster prophy 500 mg qd.   - Bactrim stopped due to pancytopenia. Allergic to dapsone. Switched to Pentamidine (Dose #1 - 5/8). Continues on monthly Pentamidine given low CD4 count. Pentamidine last given 09/28/16.     Low CD4  06/30/16: Off IS. CD4 count 126.   09/07/16: CD4 count 137.    09/28/16: Received Pentamidine today. Plan to check CD4 level at next visit and continue with monthly pentamidine.     Viral studies:  - Weekly viral panels negative [8/28]. CMV/EBV pending today.   ??  CV:   Hx of anthracycline induced cardiomyopathy:  - Nov 2017 w/EF 35%. This has now resolved with 01/2016 Echo showing estimated EF of 50%.   - Treated with lisinopril and metoprolol (Metoprolol 50mg  XL).   - Now off all antihypertensives due to intolerance (hypotension).     HTN: Lisinopril stopped d/t AKI.   04/07/16: D/C'd Norvasc. Due to asymptomatic hypotension. Pre-transplant blood pressures run in the 100/70-80 range per her report.   Per Dr. Barbette Merino, ok to stop metoprolol given low BPs with plan to repeat Echo in 6-8 weeks.  07/06/16: Baseline ECHO today showed EF of 45-50%. DC metoprolol.   07/13/16: Follow BP. Will repeat ECHO 8-12 weeks after stopping metoprolol. Repeat ECHO scheduled for 10/26/2016    Pulm: DLCO adj: 85% (Dinkara)    GI/Hepatic:   - Completed VOD prophylaxis.     Elevated LFTs:  -Mild elevation at visit on 7/16  - 08/10/16 AST and ALT now 107/170 respectively. Normal amylase and lipase. No abdominal pain or GI symptoms.   - 08/13/16   AST 191 and ALT 283. Alk phos 177.  - 08/18/16  AST 144 and ALT 264. Alk phos 218. Liver US: with patent hepatic vasculature notable for large simple hepatic cyst and gallstones.  - 09/07/16: AST 136 and ALT 242. Alk phos 197. Stable.   - 09/28/16: AST 91 and ALT 159. Alk phos 128. LFTs continue to improve.     Bone Health:  03/31/16: DEXA scan showed osteoporosis of the spine and osteopenia of the femoral neck. Zometa and Ca/Vit D.   04/02/16: Received 1st dose of Zometa.   07/06/16. 2nd dose of Zometa. Completed. Continue Ca/Vit D supplement.     Dispo:  - RTC in 1 month to see Dr. Oswald Hillock and for next Pentamidine.        Barnetta Hammersmith  09/27/2016  5:31 PM

## 2016-09-28 NOTE — Unmapped (Signed)
Discharged from infusion area ambulatory with no complications noted post pentamidine.

## 2016-09-28 NOTE — Unmapped (Addendum)
It was great seeing you today.    Your labs look good. Your liver test continue to improve.     Return to clinic in 4 weeks on 10/26/16 to see Dr. Oswald Hillock. You will receive a time when you check out.     Lab Results   Component Value Date    WBC 3.4 (L) 09/28/2016    HGB 11.6 (L) 09/28/2016    HCT 33.7 (L) 09/28/2016    PLT 216 09/28/2016       Lab Results   Component Value Date    NA 142 09/28/2016    K 3.8 09/28/2016    CL 102 09/28/2016    CO2 28.0 09/28/2016    BUN 12 09/28/2016    CREATININE 0.67 09/28/2016    GLU 85 09/28/2016    CALCIUM 9.9 09/28/2016    MG 1.8 07/26/2016    PHOS 3.0 04/15/2016       Lab Results   Component Value Date    BILITOT 0.5 09/28/2016    BILIDIR <0.10 04/15/2016    PROT 6.7 09/28/2016    ALBUMIN 4.2 09/28/2016    ALT 159 (H) 09/28/2016    AST 91 (H) 09/28/2016    ALKPHOS 128 (H) 09/28/2016    GGT 115 (H) 08/10/2016       Lab Results   Component Value Date    INR 1.22 04/15/2016    APTT 24.3 (L) 04/15/2016       --------------------------------------------------------------------------------------------------------------------  For appointments & questions Monday through Friday 8 AM-4:30 PM     Please call [phone] or Toll free [phone]    On Nights, Weekends and Holidays  Call [phone] and ask for the oncologist on call    Please visit PrivacyFever[employer]cz, a resource created just for family members and caregivers.  This website lists support services, how and where to ask for help. It has tools to assist you as you help [place] care for your loved one.    [facility]  [address]  [place], [place] [address]  [email or web address]

## 2016-09-29 LAB — CMV DNA, QUANTITATIVE, PCR: CMV VIRAL LD: NOT DETECTED

## 2016-09-29 LAB — LYMPH MARKER LIMITED,FLOW
ABSOLUTE CD4 CNT: 165 {cells}/uL — ABNORMAL LOW (ref 510–2320)
ABSOLUTE CD8 CNT: 108 {cells}/uL — ABNORMAL LOW (ref 180–1520)
CD3% (T CELLS)": 47 % — ABNORMAL LOW (ref 61–86)
CD4% (T HELPER)": 29 % — ABNORMAL LOW (ref 34–58)
CD8% T SUPPRESR": 19 % (ref 12–38)

## 2016-09-29 LAB — ABSOLUTE CD4 CNT: Cells.CD3+CD4+:NCnc:Pt:XXX:Qn:: 165 — ABNORMAL LOW

## 2016-09-29 LAB — CMV COMMENT: Lab: 0

## 2016-09-30 LAB — EBV DNA DETECT/QUANT: Lab: NOT DETECTED

## 2016-10-22 MED FILL — VALACYCLOVIR/500MG/TAB: VALACYCLOVIR/500MG/TAB | 30 days supply | Qty: 30 | Fill #8

## 2016-10-26 ENCOUNTER — Ambulatory Visit
Admission: RE | Admit: 2016-10-26 | Discharge: 2016-10-26 | Disposition: A | Discharge status: Home / Self Care | Payer: BC Managed Care – PPO

## 2016-10-26 ENCOUNTER — Ambulatory Visit
Admission: RE | Admit: 2016-10-26 | Discharge: 2016-10-26 | Disposition: A | Discharge status: Home / Self Care | Payer: BC Managed Care – PPO | Attending: Cardiovascular Disease | Admitting: Cardiovascular Disease

## 2016-10-26 ENCOUNTER — Ambulatory Visit: Admission: RE | Admit: 2016-10-26 | Discharge: 2016-10-26 | Disposition: A | Discharge status: Home / Self Care

## 2016-10-26 DIAGNOSIS — I427 Cardiomyopathy due to drug and external agent: Principal | ICD-10-CM

## 2016-10-26 DIAGNOSIS — Z9484 Stem cells transplant status: Principal | ICD-10-CM

## 2016-10-26 DIAGNOSIS — C9201 Acute myeloblastic leukemia, in remission: Principal | ICD-10-CM

## 2016-10-26 DIAGNOSIS — Z9481 Bone marrow transplant status: Principal | ICD-10-CM

## 2016-10-26 LAB — EBV VIRAL LOAD RESULT: Lab: NOT DETECTED

## 2016-10-26 LAB — CBC W/ AUTO DIFF
BASOPHILS ABSOLUTE COUNT: 0 10*9/L (ref 0.0–0.1)
EOSINOPHILS ABSOLUTE COUNT: 0.2 10*9/L (ref 0.0–0.4)
HEMATOCRIT: 36.7 % (ref 36.0–46.0)
HEMOGLOBIN: 12.5 g/dL (ref 12.0–16.0)
LARGE UNSTAINED CELLS: 5 % — ABNORMAL HIGH (ref 0–4)
LYMPHOCYTES ABSOLUTE COUNT: 0.6 10*9/L — ABNORMAL LOW (ref 1.5–5.0)
MEAN CORPUSCULAR HEMOGLOBIN CONC: 34.1 g/dL (ref 31.0–37.0)
MEAN CORPUSCULAR HEMOGLOBIN: 34.2 pg — ABNORMAL HIGH (ref 26.0–34.0)
MEAN CORPUSCULAR VOLUME: 100.3 fL — ABNORMAL HIGH (ref 80.0–100.0)
MONOCYTES ABSOLUTE COUNT: 0.3 10*9/L (ref 0.2–0.8)
NEUTROPHILS ABSOLUTE COUNT: 1.8 10*9/L — ABNORMAL LOW (ref 2.0–7.5)
PLATELET COUNT: 201 10*9/L (ref 150–440)
RED BLOOD CELL COUNT: 3.66 10*12/L — ABNORMAL LOW (ref 4.00–5.20)
RED CELL DISTRIBUTION WIDTH: 13 % (ref 12.0–15.0)

## 2016-10-26 LAB — COMPREHENSIVE METABOLIC PANEL
ALBUMIN: 4.1 g/dL (ref 3.5–5.0)
ALKALINE PHOSPHATASE: 114 U/L (ref 38–126)
ALT (SGPT): 139 U/L — ABNORMAL HIGH (ref 15–48)
ANION GAP: 12 mmol/L (ref 9–15)
AST (SGOT): 84 U/L — ABNORMAL HIGH (ref 14–38)
BILIRUBIN TOTAL: 0.5 mg/dL (ref 0.0–1.2)
BLOOD UREA NITROGEN: 10 mg/dL (ref 7–21)
BUN / CREAT RATIO: 14
CHLORIDE: 104 mmol/L (ref 98–107)
CREATININE: 0.69 mg/dL (ref 0.60–1.00)
EGFR MDRD NON AF AMER: >=60 mL/min/{1.73_m2} (ref >=60)
GLUCOSE RANDOM: 105 mg/dL (ref 65–179)
POTASSIUM: 3.9 mmol/L (ref 3.5–5.0)
PROTEIN TOTAL: 6.8 g/dL (ref 6.5–8.3)
SODIUM: 143 mmol/L (ref 135–145)

## 2016-10-26 LAB — LYMPH MARKER LIMITED,FLOW
ABSOLUTE CD3 CNT: 330 {cells}/uL — ABNORMAL LOW (ref 915–3400)
ABSOLUTE CD4 CNT: 180 {cells}/uL — ABNORMAL LOW (ref 510–2320)
ABSOLUTE CD8 CNT: 150 {cells}/uL — ABNORMAL LOW (ref 180–1520)
CD3% (T CELLS)": 44 % — ABNORMAL LOW (ref 61–86)
CD8% T SUPPRESR": 20 % (ref 12–38)

## 2016-10-26 LAB — HEMOGLOBIN: Lab: 12.5

## 2016-10-26 LAB — CALCIUM: Calcium:MCnc:Pt:Ser/Plas:Qn:: 10

## 2016-10-26 LAB — SMEAR REVIEW

## 2016-10-26 LAB — CD3% (T CELLS)": Lab: 44 — ABNORMAL LOW

## 2016-10-26 NOTE — Unmapped (Signed)
BMT Clinic Follow-up Note  ??  Patient Name: Christine [name]  Medical Record Number:  [id number]  Encounter Date: 10/26/2016  ??  BMT Attending MD:  Lanae Boast, MD.   ??  HPI:   Christine [name] is a 61 y.o. 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 D +245. Tac stop date 06/15/16 (4 months ago).    ??  Interval History:  Christine [name] is feeling well today with no new complaints. Her energy has been good. She denies any nausea, vomiting, or diarrhea. She has no new rashes. She denies any fever, chills, SOB, chest pain, bleeding, bruising, or infectious symptoms. She continues to have a good appetite and her weight is stable today. She is drinking well and her renal function is good today.   ??  PMH, PSH, and PSocH: Reviewed and there are no changes.   ??  Allergies:  Dapsone; Rasburicase; Vancomycin analogues; Adhesive; Cefepime; Morphine; and Penicillins   Medications: Reviewed and updated in EPIC.   ??  Review of Systems:  A comprehensive ROS is negative except for as mentioned above in HPI.     Review of Systems   Constitutional: Negative for chills, diaphoresis, fever, malaise/fatigue and weight loss.   HENT: Negative for congestion, hearing loss, sinus pain and sore throat.    Eyes: Negative.    Respiratory: Negative.  Negative for stridor.    Cardiovascular: Negative.    Gastrointestinal: Negative for abdominal pain, constipation, diarrhea, nausea and vomiting.   Genitourinary: Negative.    Musculoskeletal: Negative.    Skin: Negative for itching and rash.   Neurological: Negative.  Negative for dizziness, sensory change, speech change, focal weakness and headaches.   Endo/Heme/Allergies: Negative.    Psychiatric/Behavioral: Negative.    ??  Allergies:  Dapsone; Rasburicase; Vancomycin analogues; Adhesive; Cefepime; Morphine; and Penicillins     Medications:     Current Outpatient Prescriptions:   ???  calcium carbonate-vitamin D3 600 mg (1,500 mg)-800 unit Chew, Chew 1 tablet 2 (two) times a day with meals., Disp: 100 tablet, Rfl: 0  ???  valACYclovir (VALTREX) 500 MG tablet, Take 1 tablet (500 mg total) by mouth daily., Disp: 15 tablet, Rfl: 11  ???  lidocaine-prilocaine (EMLA) cream, Apply to skin overlying port prior to being accessed, Disp: 5 g, Rfl: 0    Current Facility-Administered Medications:   ???  flu vacc qs2018-19 6mos up(PF) (FLULAVAL, FLUZONE) 60 mcg (15 mcg x 4)/0.5 mL syringe, , , ,     BP 111/77  - Pulse 78  - Temp 36.4 ??C (Oral)  - Resp 16  - Ht 162.6 cm (5' 4.02)  - Wt 56 kg (123 lb 7.3 oz)  - LMP  (LMP Unknown)  - SpO2 97%  - BMI 21.18 kg/m??   Physical Exam   Constitutional: She is oriented to person, place, and time. She appears well-developed and well-nourished. No distress.   HENT:   Head: Normocephalic and atraumatic.   Nose: Nose normal.   Mouth/Throat: Oropharynx is clear and moist. No oropharyngeal exudate.   Eyes: Pupils are equal, round, and reactive to light. Conjunctivae are normal. No scleral icterus.   Neck: Neck supple.   Cardiovascular: Normal rate, regular rhythm and normal heart sounds.    No murmur heard.  Pulmonary/Chest: Effort normal and breath sounds normal. No respiratory distress. She has no wheezes.   Abdominal: Soft. Bowel sounds are normal. She  exhibits no distension. There is no tenderness. There is no guarding.   Musculoskeletal: Normal range of motion. She exhibits no edema, tenderness or deformity.   Lymphadenopathy:     She has no cervical adenopathy.   Neurological: She is alert and oriented to person, place, and time.   Skin: Skin is warm and dry. She is not diaphoretic.   Psychiatric: She has a normal mood and affect.     Lab Results   Component Value Date    WBC 3.0 (L) 10/26/2016    HGB 12.5 10/26/2016    HCT 36.7 10/26/2016    PLT 201 10/26/2016       Lab Results   Component Value Date    NA 143 10/26/2016    K 3.9 10/26/2016    CL 104 10/26/2016    CO2 27.0 10/26/2016    BUN 10 10/26/2016    CREATININE 0.69 10/26/2016    GLU 105 10/26/2016    CALCIUM 10.0 10/26/2016    MG 1.8 07/26/2016    PHOS 3.0 04/15/2016       Lab Results   Component Value Date    BILITOT 0.5 10/26/2016    BILIDIR <0.10 04/15/2016    PROT 6.8 10/26/2016    ALBUMIN 4.1 10/26/2016    ALT 139 (H) 10/26/2016    AST 84 (H) 10/26/2016    ALKPHOS 114 10/26/2016    GGT 115 (H) 08/10/2016       Lab Results   Component Value Date    INR 1.22 04/15/2016    APTT 24.3 (L) 04/15/2016     ASSESSMENT / IMPRESSION:  **AML:??(FLT3-ITD, +11, and IDH2) in CR prior to transplant.   06/22/16: BMBx:   - ??Normocellular bone marrow (40%) with trilineage hematopoiesis and 3% blasts.  - ??Routine cytogenetics: donor karyotype; FISH (trisomy 11) results are normal.  - ??Flow cytometric MRD results reveal no evidence of aberrant myeloid antigen expression or abnormal myeloblasts.  - ??DNA Chimerism see below.  - Considered maintenance with FLT3 inhibitor (sorafenib). Was reluctant to start around D+45-60 due to pancytopenia and changing chimerism. Decided against it.    ??  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    Conditioning:??  ????????????????????????Busulfan PK calculated from test dose 80 mg/hr daily and Fludarabine 40 mg/m2 daily ??day -5 thru day -2  ????????????????????????Day 0 -??02/24/16  Cell dose:??3.24 x10^8 TNC (Marrow)  Chimerisms/Marrow:  03/31/16: Day 30 marrow normocellular w/1% blasts. FISH and cytos are normal. MRD negative.   BM Chimerisms show 75% donor CD3 and >95% donor in unfractionated compartment.   04/22/16: PB Chimerisms show 71% donor CD3 and >95% donor unfractionated.   05/05/16: BM Chimerisms show 65% donor CD3 and >95% donor unfractionated.   05/18/16: PB Chimerisms show 74% donor CD3 and 95% donor unfractionated.   06/01/16: PB Chimerisms show 69% donor CD3 and >95% donor unfractionated.  06/22/16: PB Chimerisms show 80% donor CD3 and >95% donor unfractionated  06/22/16: BM Chimerisms show 79% donor CD3 and >95% donor unfractionated.   07/06/16: PB Chimerisms show 77% donor CD3 and >95% donor unfractionated.  07/13/16: Off tac x 4 weeks. Chimerism check next visit.  If no full chimerism at 8-10 weeks after discontinuation of tac will consider DLI. We do not have any donor cells stored. If we need to recollect for DLI, will consider mobilizing the donor and collecting CD3+ cells for DLI as well as CD34+ cells for another transplant.  07/26/16: Off tac almost 6 weeks. PB Chimerisms show 74% donor in CD3 and >95% donor unfractionated.   08/10/16: Off tac 8 weeks. Repeat DNA shows slight increase in CD3 to 79%. Plan after discussion with Dr. Oswald Hillock to re-collect sister for more donor cells for DLI. However, today with elevated LFTs, this is new, will need to follow these and make sure no GVHD prior to contacting sister for further donation. This was discussed with Pam today.   08/18/16: Off tac 9 weeks. PB Chimerisms show 85% donor CD3 and >95% donor unfractionated.  08/24/16: Off tac 10 weeks. BM Chimerisms show 79% donor CD3 and >95% donor unfractionated.  09/07/16: Off tac 12 weeks. PB Chimerisms show 91% donor CD3 and >95% donor unfractionated.   09/28/16: Off tac 15 weeks. PB chimerism 94% donor CD3 and >95% donor unfractionated.  10/26/16: Off tac 19 weeks. PB chimerism 90% donor CD3 and >95% donor unfractionated.   ??  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   - Methotrexate on day +1, 3, 6, 11 (received all 4 doses)  - Tac tapered and eventually DC'd 06/15/16    -  Has had no e/o GVHD,developed new elevated LFTs noted at visit on 7/31. Liver US(08/18/16): 1.Patent hepatic vasculature with normal flow direction 2.Large simple hepatic cysts.3. Stone in gallbladder.   - 10/26/16: LFTs continue to improve.   ??  Heme:??CBC stable.   Transfusion criteria: * Pre-med with tylenol and Zyrtec pre- blood or platelet transfusion.   - Hgb <8 and Plt <10K or bleeding.   - Granix if ANC <0.5. ??  Hx of DVT:  - Completed 6 mos or anticoagulation.     **ID:??  Prophylaxis:   - Continue Valtrex for Zoster prophy 500 mg qd.   - Bactrim stopped due to pancytopenia. Allergic to dapsone. Switched to Pentamidine (Dose #1 - 5/8). Continues on monthly Pentamidine given low CD4 count. Pentamidine last given 09/28/16.   ??  Low CD4  - 06/30/16: Off IS. CD4 count 126.   - 09/07/16: CD4 count 137.    - 09/28/16: Received Pentamidine today. Plan to check CD4 level at next visit and continue with monthly pentamidine.   - 10/26/16: CD4 counts 180. Continue monthly pentamidine.   ??  Viral studies:  - Weekly viral panels negative [8/28]. CMV/EBV pending today.   ??  **CV:   Hx of anthracycline induced cardiomyopathy:  - Nov 2017 w/EF 35%. This has now resolved with 01/2016 Echo showing estimated EF of 50%.   - Treated with lisinopril and metoprolol (Metoprolol 50mg  XL).   - Now off all antihypertensives due to intolerance (hypotension).   ??  HTN: Lisinopril stopped d/t AKI.   - 04/07/16: D/C'd Norvasc. Due to asymptomatic hypotension. Pre-transplant blood pressures run in the 100/70-80 range per her report.   Per Dr. Barbette Merino, ok to stop metoprolol given low BPs with plan to repeat Echo in 6-8 weeks.  - 07/06/16: Baseline ECHO today showed EF of 45-50%. DC metoprolol.   - 07/13/16: Follow BP. Will repeat ECHO 8-12 weeks after stopping metoprolol. Repeat ECHO scheduled for 10/26/2016.  - 10/26/16: Repeat ECHO and follow up with Ramond Marrow. Pending.   ??  **GI/Hepatic:   - Completed VOD prophylaxis.   - Elevated LFTs: Mild elevation at visit on 07/26/16. 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   --- 08/10/16 AST and ALT now 107/170 respectively. Normal amylase and lipase. No abdominal pain or GI symptoms.   --- 08/13/16   AST 191 and ALT 283. Alk phos 177.  ---  08/18/16  AST 144 and ALT 264. Alk phos 218. Liver US: with patent hepatic vasculature notable for large simple hepatic cyst and gallstones.  --- 09/07/16: AST 136 and ALT 242. Alk phos 197. Stable.   --- 09/28/16: AST 91 and ALT 159. Alk phos 128. LFTs continue to improve.   --- 10/26/16: AST 84 and ALT 139. Alk phos 114. LFTs continue to improve.   ??  **Bone Health:  - 03/31/16: DEXA scan showed osteoporosis of the spine and osteopenia of the femoral neck. Zometa and Ca/Vit D.   - 04/02/16: Received 1st dose of Zometa.   - 07/06/16. 2nd dose of Zometa. Completed. Continue Ca/Vit D supplement.   ??  Plan: Christine [name] is 8 mo out of her allo-SCT. She has always been a full donor chimera is unfractionated compartment but a mixed chimera in CD3 compartment. The CD3 chimerism improved after tapering of tacrolimus. This coincided with mild transaminitis, which is now improving.    Today she is >95% donor in unfractionated compartment and 90% donor in CD3 compartment.   - Will repeat chimerism in 2 weeks.   - RTC in 1 month.      Starr Sinclair MD  H/O Fellow PGY5.    I saw the patient with the Resident. I discussed the findings, assessment and plan with the Resident and agree with the findings and plan as documented in the Resident's note. I edited above note.     Lanae Boast, MD

## 2016-10-26 NOTE — Unmapped (Signed)
Cardio-oncology Clinic New Patient/Consult Note    Referring Provider: Benita Gutter  [facility]   Primary Provider: Allegra Grana, NP   [phone] [facility]  [place] [address]       Reason for Visit:  Christine [name] is a 61 y.o. female referred by Benita Gutter MD for evaluation of new onset cardiomyopathy in the setting of treatment for AML.    Assessment & Plan:  1. Cardiomyopathy  The etiology of her cardiomyopathy is not completely certain but given the timing of its onset seems very likely related to receiving daunorubicin. Ischemia could also be considered in a 61 year-old woman, but she has no other risk factors for CAD and no symptoms to suggest ischemia. Her ejection fraction improved steadily and now appears normal or nearly normal on echocardiogram.She did not tolerate neurohormonal antagonists due to symptomatic hypotension. As such, and in light of the fact that she is asymptomatic with excellent functional status, I will not resume ACE inhibitor or beta blocker at this time.    Plan echocardiogram in 1 year and yearly thereafter.    2. Left bundle branch block  Transient during chemotherapy    3. Acute myeloid leukemia  S/p SCT  Doing quite well with good therapeutic response. Ongoing management per Cancer Center team.    Return to clinic in 12 months for an echo, sooner if needed    History of Present Illness:  Christine [name] eturns for ongoing evaluation and management of cardiomyopathy in the setting of treatment for AML. Christine [name] has no history of heart disease of any sort. She always has been very physically active. Prior to diagnosis of AML she regularly walked 3 miles without any chest pain or shortness of breath. She underwent stem cell transplant on February 24, 2016. He was understandably somewhat slow to recover thereafter but now has resumed her usual level of physical activity. She walks 3 miles every day without any limitation from chest pain or shortness of breath. She does Pilates regularly, periodically teaching classes. She still has no exertional symptoms whatsoever. She also denies LE edema, orthopnea, PND, palpitations, near syncope, and syncope.            Cardiovascular History & Procedures:  Cath / PCI:  None  CV Surgery:  None  EP Procedures and Devices:  None    Non-Invasive Evaluation(s):  Echo:  08/28/15  ?? Normal left ventricular systolic function, ejection fraction > 55%  ?? Dilated left atrium - mild  ?? Normal right ventricular systolic function  ?? No significant valvular abnormalities  ?? Degenerative mitral valve disease  ?? Mitral regurgitation - mild    11/04/15  ?? Left ventricular hypertrophy - mild  ?? EF estimated 45-50%, with septal dyskinesis likely due to new LBBB, this is a new finding compared to echo from 08/2015  ?? Diastolic dysfunction - grade II (elevated filling pressures)  ?? Degenerative mitral valve disease  ?? Dilated left atrium - mild  ?? Aortic sclerosis  ?? Normal right ventricular systolic function    12/03/15  ?? Limited study to evaluate ventricular function  ?? Dilated left ventricle - mild  ?? Moderately decreased left ventricular systolic function, ejection fraction 35%  ?? Diastolic dysfunction - grade I (normal filling pressures)  ?? Mitral regurgitation - mild  ?? Normal right ventricular systolic function  ?? Pericardial effusion - small    CT/MRI/Nuclear Tests:  None  Past Medical History:   Diagnosis Date   ??? 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 AML (acute myelogenous leukemia) (CMS-HCC)    ??? 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 DVT (deep venous thrombosis) (CMS-HCC)    ??? Hypertension    ??? Osteoporosis     Stopped medications due to interactions with chemo drugs      Oncology History    Referring/Local Oncologist: none    Diagnosis: de novo AML with mutated FLT3    Genetics:   Karyotype/FISH: 47XX +11     Molecular Genetics: FLT3 ITD present; IDH2: c.419G>A [p.Arg140Gln]  ??  No variants detected in ASXL1, BCOR, CEBPA, DNMT3A, ETV6/TEL, EZH2, FLT3, IDH1, KIT, NPM1, NRAS, RUNX1, SF3B1, SRSF2, STAG2, TET2, TP53, U2AF1, WT1, ZRSR2    Pertinent Phenotypic data: blasts expressed CD4, CD11b, CD11c, CD33, CD34, CD64, CD117, and HLA-DR and lack expression of other informative analyzed markers including B and T-cell antigens.     Disease-specific prognostic estimation: ELN Adverse (per FLT3 allelic ratio high per estimate by molecular pathologist)          Acute myeloid leukemia (CMS-HCC)    08/29/2015 Initial Diagnosis     Acute myeloid leukemia (RAF-HCC)         09/02/2015 -  Chemotherapy     Induction therapy with daunorubicin 60 mg/m2 IV daily x 3, cytarabine 200 mg/m2/d CIV x 7 days, midostaurin 50 mg PO BID days 8-21         09/22/2015 Biopsy     50% cellular marrow with 50% blasts         09/25/2015 -  Chemotherapy     CLAG, cladribine 5 mg/m2/day days 1-5, cytarabine 2 grams/m2 IV days 1-5, GCSF days 0-5         10/23/2015 Remission     CR         11/03/2015 -  Chemotherapy     Cytarabine 1.5 g/m2 IV Q12 hrs days 1, 3, 5         11/04/2015 Adverse Reaction     Cardiomyopathy         11/28/2015 Adverse Reaction     Stenotrophomonas maltophilia CVAD infection         12/19/2015 -  Chemotherapy     Cytarabine 1.5 g/m2 IV Q12 hrs days 1, 3, 5         02/24/2016 Transplant     CTN 1301 (control arm) conditioning regimen with Busulfan and Fludarabine followed by matched sibling allo, received bone marrow product with cell dose of 3.18 x 10^6.               Past Surgical History:   Procedure Laterality Date   ??? IR INSERT PORT AGE GREATER THAN 5 YRS  12/19/2015    IR INSERT PORT AGE GREATER THAN 5 YRS 12/19/2015 Ammie Dalton, MD IMG VIR MM MMNT   ??? PR UPPER GI ENDOSCOPY,BIOPSY N/A 05/05/2016    Procedure: UGI ENDOSCOPY; WITH BIOPSY, SINGLE OR MULTIPLE;  Surgeon: Jolyn Lent, MD;  Location: GI PROCEDURES [facility];  Service: Gastroenterology   ??? SHOULDER SURGERY           Allergies:  Dapsone; Rasburicase; Vancomycin analogues; Adhesive; Amoxicillin; Cefepime; Morphine; and Penicillins  Current Medications:  Current Outpatient Prescriptions   Medication Sig Dispense Refill   ??? calcium carbonate-vitamin D3 600 mg (1,500 mg)-800 unit Chew Chew 1 tablet 2 (two) times a day with meals. 100 tablet 0   ??? lidocaine-prilocaine (EMLA) cream Apply to skin overlying port prior to being accessed 5 g 0   ??? valACYclovir (VALTREX) 500 MG tablet Take 1 tablet (500 mg total) by mouth daily. 15 tablet 11     No current facility-administered medications for this visit.      Facility-Administered Medications Ordered in Other Visits   Medication Dose Route Frequency Provider Last Rate Last Dose   ??? albuterol 2.5 mg /3 mL (0.083 %) nebulizer solution 2.5 mg  2.5 mg Nebulization Once PRN Ellin Goodie, ACNP       ??? flu vacc qs2018-19 6mos up(PF) (FLULAVAL, FLUZONE) 60 mcg (15 mcg x 4)/0.5 mL syringe            ??? heparin, porcine (PF) 100 unit/mL injection 500 Units  500 Units Intravenous Q30 Min PRN Guerry Bruin, MD   500 Units at 10/26/16 [phone]   ??? pentamidine (PENTAM) inhalation solution 300 mg  300 mg Inhalation Once Ellin Goodie, ACNP       ??? sodium chloride (NS) flush 10 mL  10 mL Intravenous Q30 Min PRN Guerry Bruin, MD   10 mL at 10/26/16 0829   ??? sodium chloride (NS) flush 20 mL  20 mL Intravenous Q30 Min PRN Guerry Bruin, MD   20 mL at 10/26/16 [id number]       Family History:  There is no family history of premature coronary artery disease or sudden cardiac death.     Social history:  Social History     Social History   ??? Marital status: Married     Spouse name: N/A   ??? Number of children: N/A   ??? Years of education: N/A     Social History Main Topics   ??? Smoking status: Never Smoker   ??? Smokeless tobacco: Never Used   ??? Alcohol use No   ??? Drug use: No   ??? Sexual activity: Not Asked     Other Topics Concern   ??? 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      Highest level of education: high school       Review of Systems:  A full review of 10 systems is unremarkable except as stated in the HPI.     Physical Exam:  VITAL SIGNS:   Vitals:    10/26/16 1106   BP: 120/82   Pulse: 67   SpO2: 99%      Wt Readings from Last 3 Encounters:   10/26/16 55.7 kg (122 lb 12.8 oz)   10/26/16 56 kg (123 lb 7.3 oz)   09/28/16 56 kg (123 lb 6.4 oz)      Today's Body mass index is 21.08 kg/m??.  GENERAL: well-appearing in no acute distress  HEENT: Normocephalic and atraumatic with anicteric sclerae    NECK: Supple, without lymphadenopathy or thyromegaly. JVP flat. There are no carotid bruits  CARDIOVASCULAR: Regular S1S2 without murmur or gallop  RESPIRATORY: Clear to  auscultation without wheezes or rales.   ABDOMEN: Soft, non-tender, non-distended with audible bowel sounds. There is no organomegaly or palpable pulsatile mass.   EXTREMITIES:  Lower extremities are warm and without edema. Distal pulses are full and symmetric.   SKIN: No rashes, ecchymosis or petechiae.  NEURO: Alert, pleasant, and appropriate. Non-focal neuro exam    Pertinent Laboratory Studies:   Lab Results   Component Value Date    PROBNP 1,120.0 (H) 11/04/2015    CREATININE 0.69 10/26/2016    CREATININE 0.67 09/28/2016    BUN 10 10/26/2016    BUN 12 09/28/2016    K 3.9 10/26/2016    K 3.8 09/28/2016    MG 1.8 07/26/2016    MG 1.9 07/13/2016    AST 84 (H) 10/26/2016    AST 91 (H) 09/28/2016    ALT 139 (H) 10/26/2016    ALT 159 (H) 09/28/2016    BILITOT 0.5 10/26/2016    BILITOT 0.5 09/28/2016    INR 1.22 04/15/2016    INR 1.39 03/15/2016 WBC 3.0 (L) 10/26/2016    WBC <0.1 (LL) 03/05/2016    HGB 12.5 10/26/2016    HCT 36.7 10/26/2016    PLT 201 10/26/2016       Other pertinent records were reviewed.    Pertinent Test Results from Today:  Echo (prelim): EF 50-55%, GLS -18.7

## 2016-10-26 NOTE — Unmapped (Signed)
Patient arrived to Rm 8 for inhaled pentamidine. Denies any issues or concerns today. Inhaled albuterol initiated at 12:30. Inhaled pentamidine initiated at 1240. Following treatment, pt tolerated without complication.  Pt discharged ambulatory at 1300.

## 2016-10-26 NOTE — Unmapped (Incomplete)
[facility] BMT Clinic Note    PATIENT INFORMATION  Christine [name]  61 y.o.  female  [id number]  04/05/1955    BMT Attending: ***    HPI:      Interval Hx:  She appears well today. She's been off tacrolimus for nearly 4 months.    Review of Systems   Constitutional: Negative for chills, fever, malaise/fatigue and weight loss.   HENT: Negative.  Negative for sore throat.    Eyes: Negative.    Respiratory: Negative.    Cardiovascular: Negative.    Gastrointestinal: Negative.  Negative for abdominal pain, blood in stool, constipation, diarrhea, nausea and vomiting.   Genitourinary: Negative.    Musculoskeletal: Negative.    Skin: Negative.    Neurological: Negative.  Negative for dizziness, sensory change, speech change and headaches.   Endo/Heme/Allergies: Negative.    Psychiatric/Behavioral: Negative.  Negative for depression. The patient is not nervous/anxious and does not have insomnia.        Past Medical History:   Diagnosis Date   ??? 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 AML (acute myelogenous leukemia) (CMS-HCC)    ??? 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 DVT (deep venous thrombosis) (CMS-HCC)    ??? Hypertension    ??? 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      Highest level of education: high school     Family History   Problem Relation Age of Onset   ??? Breast cancer Mother    ??? Cancer Paternal Grandfather         oral   ??? Cancer Maternal Aunt         unknown primary   ??? Asthma Neg Hx    ??? Allergic rhinitis Neg Hx    ??? Eczema Neg Hx        Current Outpatient Prescriptions:   ???  calcium carbonate-vitamin D3 600 mg (1,500 mg)-800 unit Chew, Chew 1 tablet 2 (two) times a day with meals., Disp: 100 tablet, Rfl: 0  ???  lidocaine-prilocaine (EMLA) cream, Apply to skin overlying port prior to being accessed, Disp: 5 g, Rfl: 0  ???  valACYclovir (VALTREX) 500 MG tablet, Take 1 tablet (500 mg total) by mouth daily., Disp: 15 tablet, Rfl: 11    Current Facility-Administered Medications:   ???  heparin, porcine (PF) 100 unit/mL injection 500 Units, 500 Units, Intravenous, Q30 Min PRN, Guerry Bruin, MD, 500 Units at 10/26/16 8119  ???  sodium chloride (NS) flush 10 mL, 10 mL, Intravenous, Q30 Min PRN, Guerry Bruin, MD, 10 mL at 10/26/16 0829  ???  sodium chloride (NS) flush 20 mL, 20 mL, Intravenous, Q30 Min PRN, Guerry Bruin, MD, 20 mL at 10/26/16 0829      There were no vitals filed for this visit.    Physical Exam  Constitutional: She is oriented to person, place, and time. She appears well-developed and well-nourished.   HENT:   Head: Normocephalic and atraumatic.   Nose: Nose normal.   Mouth/Throat: Oropharynx is clear and moist. No oropharyngeal exudate.   Eyes: Pupils are equal, round, and reactive to light. Conjunctivae are normal. No scleral icterus.   Neck: Neck supple.   Cardiovascular: Normal rate, regular rhythm and normal heart sounds.  Exam reveals no gallop and no friction rub.    No murmur heard.  Pulmonary/Chest: Effort normal and breath sounds normal.   Abdominal: Soft. Bowel sounds are normal. She exhibits no distension and no mass. There is no tenderness. There is no guarding.   Musculoskeletal: Normal range of motion. She exhibits no edema or deformity.   Lymphadenopathy:     She has no cervical adenopathy.   Neurological: She is alert and oriented to person, place, and time.   Skin: Skin is warm and dry.   Psychiatric: She has a normal mood and affect.     Lab Results Component Value Date    WBC 3.0 (L) 10/26/2016    HGB 12.5 10/26/2016    HCT 36.7 10/26/2016    PLT 201 10/26/2016       Lab Results   Component Value Date    NA 142 09/28/2016    K 3.8 09/28/2016    CL 102 09/28/2016    CO2 28.0 09/28/2016    BUN 12 09/28/2016    CREATININE 0.67 09/28/2016    GLU 85 09/28/2016    CALCIUM 9.9 09/28/2016    MG 1.8 07/26/2016    PHOS 3.0 04/15/2016       Lab Results   Component Value Date    BILITOT 0.5 09/28/2016    BILIDIR <0.10 04/15/2016    PROT 6.7 09/28/2016    ALBUMIN 4.2 09/28/2016    ALT 159 (H) 09/28/2016    AST 91 (H) 09/28/2016    ALKPHOS 128 (H) 09/28/2016    GGT 115 (H) 08/10/2016       Lab Results   Component Value Date    INR 1.22 04/15/2016    APTT 24.3 (L) 04/15/2016       Imaging:      ASSESSMENT / IMPRESSION:  Christine [name] 61 y.o. female hx:            PLAN

## 2016-10-28 LAB — CMV QUANT LOG10: Lab: 0

## 2016-10-28 LAB — CMV DNA, QUANTITATIVE, PCR

## 2016-11-01 NOTE — Unmapped (Signed)
VIR prep completed. No sedation for port removal.

## 2016-11-02 ENCOUNTER — Ambulatory Visit: Admission: RE | Admit: 2016-11-02 | Discharge: 2016-11-02 | Disposition: A | Discharge status: Home / Self Care

## 2016-11-02 DIAGNOSIS — Z9481 Bone marrow transplant status: Principal | ICD-10-CM

## 2016-11-02 NOTE — Unmapped (Signed)
Assessment/Plan:    Ms. Gawthrop is a 61 y.o. female who will undergo port removal in Interventional Radiology.    --This procedure has been fully reviewed with the patient/patient???s authorized representative. The risks, benefits and alternatives have been explained, and the patient/patient???s authorized representative has consented to the procedure.  --The patient will accept blood products in an emergent situation.  --The patient does not have a Do Not Resuscitate order in effect.      CC: No chief complaint on file.      HPI: Ms. Galati is a 62 y.o. female with indwelling port, 12/19/15 who will undergo port removal.    Allergies:   Allergies   Allergen Reactions   ??? Dapsone      Relative G6PD deficiency (lvl=5.1, lower limit of normal is 5.3)   ??? Rasburicase      Relative G6PD deficiency (lvl=5.1, lower limit of normal is 5.3)   ??? Vancomycin Analogues Other (See Comments)     Avoid if possible, but if needed: Red man syndrome- slow rate over 2 hours, benadryl and ranitidine premed   ??? Adhesive Rash     Uses mepore     ??? Amoxicillin Rash   ??? Cefepime Rash     Can trial again while taking with antihistamine medications/antihistamine premeds   ??? Morphine Rash     Can trial again while taking with antihistamine medications/antihistamine premeds (fentanyl may be better alternative if possible)   ??? Penicillins Rash       Medications:   No current facility-administered medications for this encounter.      Facility-Administered Medications Ordered in Other Encounters   Medication Dose Route Frequency Provider Last Rate Last Dose   ??? flu vacc [id number] 6mos up(PF) (FLULAVAL, FLUZONE) 60 mcg (15 mcg x 4)/0.5 mL syringe                PMH:   Past Medical History:   Diagnosis Date   ??? 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 AML (acute myelogenous leukemia) (CMS-HCC)    ??? 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 DVT (deep venous thrombosis) (CMS-HCC)    ??? Hypertension    ??? Osteoporosis     Stopped medications due to interactions with chemo drugs       ASA Grade: ASA 2 - Patient with mild systemic disease with no functional limitations    ROS:  General: Denies fever or chills.  Cardiovascular: Denies chest pain.   Pulmonary: Denies shortness of breath, snoring, sleep apnea, or respiratory infection.    Allergies: Please see allergy section.       PE:    Vitals:    11/02/16 1330   BP: 113/71   Pulse: 86   Resp: 16   Temp: 36.6 ??C (97.9 ??F)   SpO2: 98%       General: WD, WN female in NAD.  CV: RRR  Lungs: Respirations nonlabored    Leona Carry, MD  PGY-5  2:01 PM  11/02/2016

## 2016-11-02 NOTE — Unmapped (Signed)
[facility] Interventional Radiology  Post-procedure Note    Patient: Christine [name]    DOB: [birth year]  Medical Record Number: [id number]   Note Date/Time: November 02, 2016 4:33 PM     Procedure: port removal    Diagnosis:  Termination or therapy.    Attending: Dr. Melynda Ripple     Fellow/Resident: Dr. Cleda Clarks    Time out: Prior to the procedure, a time out was performed with all team members present. During the time out, the patient, procedure and procedure site when applicable were verbally verified by the team members and Dr. Theodoro Doing.     Complications: NONE    Sedation: Local     EBL: Minimal    Samples:  None.    Brief Description:  Port removal with deep and subcuticular sutures. Dermabond closure.    Plan: Discharge when ready.    Full report to follow in PACS.    Cleda Clarks, MD  November 02, 2016 4:33 PM

## 2016-11-04 ENCOUNTER — Ambulatory Visit
Admission: RE | Admit: 2016-11-04 | Discharge: 2016-11-04 | Disposition: A | Discharge status: Home / Self Care | Payer: BC Managed Care – PPO

## 2016-11-04 DIAGNOSIS — Z9481 Bone marrow transplant status: Secondary | ICD-10-CM

## 2016-11-04 DIAGNOSIS — Z9484 Stem cells transplant status: Secondary | ICD-10-CM

## 2016-11-04 LAB — ABSOLUTE CD4 CNT: Cells.CD3+CD4+:NCnc:Pt:XXX:Qn:: 220 — ABNORMAL LOW

## 2016-11-04 LAB — CBC W/ AUTO DIFF
EOSINOPHILS ABSOLUTE COUNT: 0.2 10*9/L (ref 0.0–0.4)
HEMATOCRIT: 37.3 % (ref 36.0–46.0)
HEMOGLOBIN: 12.5 g/dL (ref 12.0–16.0)
LARGE UNSTAINED CELLS: 2 % (ref 0–4)
MEAN CORPUSCULAR HEMOGLOBIN CONC: 33.6 g/dL (ref 31.0–37.0)
MEAN CORPUSCULAR HEMOGLOBIN: 34 pg (ref 26.0–34.0)
MEAN CORPUSCULAR VOLUME: 101.1 fL — ABNORMAL HIGH (ref 80.0–100.0)
MONOCYTES ABSOLUTE COUNT: 0.2 10*9/L (ref 0.2–0.8)
NEUTROPHILS ABSOLUTE COUNT: 1.9 10*9/L — ABNORMAL LOW (ref 2.0–7.5)
PLATELET COUNT: 196 10*9/L (ref 150–440)
RED BLOOD CELL COUNT: 3.68 10*12/L — ABNORMAL LOW (ref 4.00–5.20)
RED CELL DISTRIBUTION WIDTH: 13 % (ref 12.0–15.0)
WBC ADJUSTED: 3 10*9/L — ABNORMAL LOW (ref 4.5–11.0)

## 2016-11-04 LAB — LYMPH MARKER LIMITED,FLOW
ABSOLUTE CD3 CNT: 365 {cells}/uL — ABNORMAL LOW (ref 915–3400)
ABSOLUTE CD4 CNT: 220 {cells}/uL — ABNORMAL LOW (ref 510–2320)
ABSOLUTE CD8 CNT: 144 {cells}/uL — ABNORMAL LOW (ref 180–1520)
CD3% (T CELLS)": 48 % — ABNORMAL LOW (ref 61–86)

## 2016-11-04 LAB — MEAN PLATELET VOLUME: Lab: 7.1

## 2016-11-18 MED FILL — VALACYCLOVIR/500MG/TAB: VALACYCLOVIR/500MG/TAB | 30 days supply | Qty: 30 | Fill #9

## 2016-11-23 ENCOUNTER — Ambulatory Visit
Admission: RE | Admit: 2016-11-23 | Discharge: 2016-11-23 | Disposition: A | Discharge status: Home / Self Care | Payer: BC Managed Care – PPO

## 2016-11-23 ENCOUNTER — Ambulatory Visit: Admission: RE | Admit: 2016-11-23 | Discharge: 2016-11-23 | Disposition: A | Discharge status: Home / Self Care

## 2016-11-23 DIAGNOSIS — Z9484 Stem cells transplant status: Principal | ICD-10-CM

## 2016-11-23 DIAGNOSIS — C9201 Acute myeloblastic leukemia, in remission: Principal | ICD-10-CM

## 2016-11-23 DIAGNOSIS — Z9481 Bone marrow transplant status: Secondary | ICD-10-CM

## 2016-11-23 LAB — COMPREHENSIVE METABOLIC PANEL
ALBUMIN: 4.2 g/dL (ref 3.5–5.0)
ALKALINE PHOSPHATASE: 96 U/L (ref 38–126)
ANION GAP: 8 mmol/L — ABNORMAL LOW (ref 9–15)
AST (SGOT): 64 U/L — ABNORMAL HIGH (ref 14–38)
BLOOD UREA NITROGEN: 9 mg/dL (ref 7–21)
BUN / CREAT RATIO: 12
CALCIUM: 9.8 mg/dL (ref 8.5–10.2)
CHLORIDE: 101 mmol/L (ref 98–107)
CO2: 31 mmol/L — ABNORMAL HIGH (ref 22.0–30.0)
CREATININE: 0.76 mg/dL (ref 0.60–1.00)
EGFR MDRD AF AMER: >=60 mL/min/{1.73_m2} (ref >=60)
EGFR MDRD NON AF AMER: >=60 mL/min/{1.73_m2} (ref >=60)
GLUCOSE RANDOM: 74 mg/dL (ref 65–179)
PROTEIN TOTAL: 7 g/dL (ref 6.5–8.3)
SODIUM: 140 mmol/L (ref 135–145)

## 2016-11-23 LAB — CBC W/ AUTO DIFF
BASOPHILS ABSOLUTE COUNT: 0 10*9/L (ref 0.0–0.1)
EOSINOPHILS ABSOLUTE COUNT: 0.2 10*9/L (ref 0.0–0.4)
HEMATOCRIT: 39 % (ref 36.0–46.0)
HEMOGLOBIN: 13.2 g/dL (ref 12.0–16.0)
LARGE UNSTAINED CELLS: 3 % (ref 0–4)
MEAN CORPUSCULAR HEMOGLOBIN CONC: 33.7 g/dL (ref 31.0–37.0)
MEAN CORPUSCULAR HEMOGLOBIN: 34.2 pg — ABNORMAL HIGH (ref 26.0–34.0)
MEAN CORPUSCULAR VOLUME: 101.3 fL — ABNORMAL HIGH (ref 80.0–100.0)
MEAN PLATELET VOLUME: 7.3 fL (ref 7.0–10.0)
MONOCYTES ABSOLUTE COUNT: 0.3 10*9/L (ref 0.2–0.8)
NEUTROPHILS ABSOLUTE COUNT: 2 10*9/L (ref 2.0–7.5)
PLATELET COUNT: 216 10*9/L (ref 150–440)
RED BLOOD CELL COUNT: 3.85 10*12/L — ABNORMAL LOW (ref 4.00–5.20)
RED CELL DISTRIBUTION WIDTH: 13.3 % (ref 12.0–15.0)
WBC ADJUSTED: 3.2 10*9/L — ABNORMAL LOW (ref 4.5–11.0)

## 2016-11-23 LAB — WBC ADJUSTED: Lab: 3.2 — ABNORMAL LOW

## 2016-11-23 LAB — LYMPH MARKER LIMITED,FLOW
ABSOLUTE CD8 CNT: 132 {cells}/uL — ABNORMAL LOW (ref 180–1520)
CD3% (T CELLS)": 43 % — ABNORMAL LOW (ref 61–86)
CD4% (T HELPER)": 24 % — ABNORMAL LOW (ref 34–58)
CD4:CD8 RATIO: 1.3 (ref 0.9–4.8)
CD8% T SUPPRESR": 19 % (ref 12–38)

## 2016-11-23 LAB — ABSOLUTE CD3 CNT: Lab: 299 — ABNORMAL LOW

## 2016-11-23 LAB — EGFR MDRD NON AF AMER: Glomerular filtration rate/1.73 sq M.predicted.non black:ArVRat:Pt:Ser/Plas/Bld:Qn:Creatinine-based formula (MDRD): >=60

## 2016-11-23 NOTE — Unmapped (Signed)
1120 Pt arrived ambulatory in NAD to room 8. 1152 Pt tolerated inhalation  Therapy well  Left infusion area ambulatory in NAD

## 2016-11-23 NOTE — Unmapped (Signed)
Review of Systems   Constitutional: Negative for chills, diaphoresis, fever, malaise/fatigue and weight loss.   HENT: Negative for congestion, hearing loss, sinus pain and sore throat.    Eyes: Negative.    Respiratory: Negative.  Negative for stridor.    Cardiovascular: Negative.    Gastrointestinal: Negative for abdominal pain, constipation, diarrhea, nausea and vomiting.   Genitourinary: Negative.    Musculoskeletal: Negative.    Skin: Negative for itching and rash.   Neurological: Negative.  Negative for dizziness, sensory change, speech change, focal weakness and headaches.   Endo/Heme/Allergies: Negative.    Psychiatric/Behavioral: Negative.    All other systems reviewed and are negative.  ??  Physical Exam

## 2016-11-24 LAB — CMV DNA, QUANTITATIVE, PCR

## 2016-11-24 LAB — CMV QUANT: Lab: 0

## 2016-11-25 LAB — EBV VIRAL LOAD RESULT: Lab: NOT DETECTED

## 2016-11-27 NOTE — Unmapped (Signed)
BMT Clinic Follow-up Note    Patient Name: Christine [name]  Medical Record Number:  [id number]  Encounter Date: 11/23/2016    BMT Attending MD:  Lanae Boast, MD.     HPI: Christine [name] is a 61 y.o. 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 D +273 days. Tac stop date 06/15/16 (5 months ago).      Interval History:  Christine [name] is feeling well today with no new complaints. Her energy has been good. She denies any nausea, vomiting, or diarrhea. She has no new rashes. She denies any fever, chills, SOB, chest pain, bleeding, bruising, or infectious symptoms. She continues to have a good appetite and her weight is stable today. She is drinking well and her renal function is good today.     PMH, PSH, and PSocH: Reviewed and there are no changes.     Allergies:  Dapsone; Rasburicase; Vancomycin analogues; Adhesive; Amoxicillin; Cefepime; Morphine; and Penicillins   Medications: Reviewed and updated in EPIC.     Review of Systems:  A comprehensive ROS is negative except for as mentioned above in HPI.     Objective:  VS:BP 104/76  - Pulse 89  - Temp 36.8 ??C (98.3 ??F) (Oral)  - Resp 16  - Wt 57.2 kg (126 lb 1.6 oz)  - LMP  (LMP Unknown)  - SpO2 100%  - BMI 21.65 kg/m??     KPS: 80%, Normal activity with effort; some signs or symptoms of disease (ECOG equivalent 1)    Acute Graft versus Host Disease Assessment: 11/23/2016  Organ stage: 0  Skin stage 0, No Rash, none, 0 % BSA;   Upper GI stage 0, no nausea and no vomiting;   Lower GI stage 0, No Diarrhea;   Hepatic stage 0, Bilirubin normal - 2mg /dL    Overall grade (Current) None     Physical Exam:   General appearance: Well appearing female in no acute distress.   Head: Alopecia, wears wig. Normal, non-tender sinuses.  Eyes: PERRL, EOMs intact, conjunctiva and sclera clear.   Neck: Supple, no palpable LAD  Lungs: CTAB. No wheezes, crackles, or rhonchi.   Heart: RRR. Normal s1/s2. No murmur, rub, or gallop.  Abdomen: Soft, non-tender, normoactive BS, no organomegaly.   Extremities: No edema. Pulses palpable in all extremities.   MSK: No joint deformity, tenderness, or swelling. FROM  Skin: Warm, dry, and intact. No rashes.   Line: R PAC accessed. Non-tender and no notable erythema.     Test Results:  Lab Results   Component Value Date    WBC 3.2 (L) 11/23/2016    HGB 13.2 11/23/2016    HCT 39.0 11/23/2016    PLT 216 11/23/2016       Lab Results   Component Value Date    NA 140 11/23/2016    K 4.0 11/23/2016    CL 101 11/23/2016    CO2 31.0 (H) 11/23/2016    BUN 9 11/23/2016    CREATININE 0.76 11/23/2016    GLU 74 11/23/2016    CALCIUM 9.8 11/23/2016    MG 1.8 07/26/2016    PHOS 3.0 04/15/2016       Lab Results   Component Value Date    BILITOT 0.5 11/23/2016    BILIDIR <0.10 04/15/2016    PROT 7.0 11/23/2016    ALBUMIN 4.2 11/23/2016    ALT 102 (H) 11/23/2016  AST 64 (H) 11/23/2016    ALKPHOS 96 11/23/2016    GGT 115 (H) 08/10/2016     ASSESSMENT / IMPRESSION:  **AML:??(FLT3-ITD, +11, and IDH2)??in CR prior to transplant.   06/22/16: BMBx:   - ??Normocellular bone marrow (40%) with trilineage hematopoiesis and 3% blasts.  - ??Routine cytogenetics: donor karyotype; FISH (trisomy 11) results are normal.  - ??Flow cytometric MRD results reveal no evidence of aberrant myeloid antigen expression or abnormal myeloblasts.  - ??DNA Chimerism see below.  - Considered maintenance with FLT3 inhibitor??(sorafenib). Was reluctant to start around D+45-60 due to pancytopenia and changing chimerism. Decided against it.    ??  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 ??  Conditioning:??  ????????????????????????Busulfan PK calculated from test dose 80 mg/hr daily and Fludarabine 40 mg/m2 daily ??day -5 thru day -2  ????????????????????????Day 0 -??02/24/16  Cell dose:??3.24 x10^8 TNC (Marrow)  Chimerisms/Marrow:  03/31/16: Day 30 marrow normocellular w/1% blasts. FISH and cytos are normal. MRD negative.   BM Chimerisms show 75% donor CD3 and >95% donor in unfractionated compartment.   04/22/16: PB Chimerisms show 71% donor CD3 and >95% donor unfractionated.   05/05/16: BM Chimerisms show 65% donor CD3 and >95% donor unfractionated.   05/18/16: PB Chimerisms show 74% donor CD3 and 95% donor unfractionated.   06/01/16: PB Chimerisms show 69% donor CD3 and >95% donor unfractionated.  06/22/16: PB Chimerisms show 80% donor CD3 and >95% donor unfractionated  06/22/16: BM Chimerisms show 79% donor CD3 and >95% donor unfractionated.   07/06/16: PB Chimerisms show 77% donor CD3 and >95% donor unfractionated.  07/13/16: Off tac x 4 weeks.??Chimerism check next visit.  If no full chimerism at 8-10 weeks after discontinuation of tac will consider DLI. We do not have any donor cells stored. If we need to recollect for DLI, will consider mobilizing the donor and collecting CD3+ cells for DLI as well as CD34+ cells for another transplant.   07/26/16: Off tac almost 6 weeks. PB Chimerisms show 74% donor in CD3 and >95% donor unfractionated.   08/10/16: Off tac 8 weeks. Repeat DNA shows slight increase in CD3 to 79%. Plan after discussion with Dr. Oswald Hillock to re-collect sister for more donor cells for DLI. However, today with??elevated LFTs, this is new, will need to follow these and make sure no GVHD prior to contacting sister for further donation. This was discussed with Christine [name] today.   08/18/16: Off tac 9 weeks.??PB Chimerisms show??85% donor CD3 and >95% donor unfractionated.  08/24/16: Off tac 10 weeks. BM Chimerisms show 79% donor CD3 and >95% donor unfractionated.  09/07/16: Off tac 12 weeks. PB Chimerisms show 91% donor CD3 and >95% donor unfractionated.??  09/28/16: Off tac 15 weeks. PB chimerism 94% donor CD3 and >95% donor unfractionated.  10/26/16: Off tac 19 weeks. PB chimerism 90% donor CD3 and >95% donor unfractionated.??  11/23/16: Off tac 23 weeks. PB chimerism 95% donor CD3 and >95% donor unfractionated.??  ??  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   - Methotrexate on day +1, 3, 6, 11 (received all 4 doses)  - Tac tapered and eventually DC'd 06/15/16????  - ??Has had no e/o GVHD,developed new elevated LFTs noted at visit on 7/31. Liver US(08/18/16): 1.Patent hepatic vasculature with normal flow direction 2.Large simple hepatic cysts.3. Stone in gallbladder.   - 11/23/16: LFTs continue to improve.   ??  Heme:??CBC stable.   Transfusion criteria: * Pre-med with tylenol and Zyrtec pre- blood or platelet  transfusion.   - Hgb <8 and Plt <10K or bleeding.   - Granix if ANC <0.5.   ??  Hx of DVT:  - Completed 6 mos or anticoagulation.     **ID:??  Prophylaxis:   - Continue Valtrex for Zoster prophy 500 mg qd.   - Bactrim stopped due to pancytopenia. Allergic to dapsone. Switched to Pentamidine (Dose #1 - 5/8). Continues on monthly Pentamidine given low CD4 count. - 11/23/16: Pentamidine last given 10/26/16. Needs another dose today.   ??  Low CD4  - 06/30/16: Off IS. CD4 count 126.   - 09/07/16: CD4 count 137. ??  - 09/28/16: Received Pentamidine today. Plan to check CD4 level at next visit and continue with monthly pentamidine.   - 10/26/16: CD4 counts 180. Continue monthly pentamidine.   - 11/23/16: CD4 counts 167. Continue monthly pentamidine.     ??  Viral studies:  - 11/23/16: Viral panels have been negative. Will stop monitoring unless clinically indicated.    ??  **CV:   Hx of anthracycline induced cardiomyopathy:  - Nov 2017 w/EF 35%. This has now resolved with 01/2016 Echo showing estimated EF of 50%.   - Treated with lisinopril and metoprolol (Metoprolol 50mg  XL).   - Now off all antihypertensives due to intolerance (hypotension).   ??  HTN:??Lisinopril stopped d/t AKI.   - 04/07/16: D/C'd Norvasc. Due to asymptomatic hypotension. Pre-transplant blood pressures run in the 100/70-80 range per her report.   Per Dr. Barbette Merino, ok to stop metoprolol given low BPs with plan to repeat Echo in 6-8 weeks.  - 07/06/16: Baseline ECHO today showed EF of 45-50%. DC metoprolol.   - 07/13/16: Follow BP. Will repeat ECHO 8-12 weeks after stopping metoprolol. Repeat ECHO scheduled for 10/26/2016.  - 10/26/16: Repeat ECHO showed normal EF. Christine [name] saw Ramond Marrow who decided not to restart ACE inhibitor or beta blocker. Recommended annual follow up with ECHO of the heart.    ??  **GI/Hepatic:??  - Completed VOD prophylaxis.   - Elevated LFTs: Mild elevation at visit on 07/26/16. 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   --- 08/10/16 AST and ALT now 107/170 respectively. Normal amylase and lipase. No abdominal pain or GI symptoms.   --- 08/13/16 ????AST 191 and ALT 283. Alk phos 177.  --- 08/18/16 ??AST 144 and ALT 264. Alk phos 218. Liver US: with patent hepatic vasculature notable for large simple hepatic cyst and gallstones.  --- 09/07/16: AST 136 and ALT 242. Alk phos 197. Stable.   --- 09/28/16: AST 91 and ALT 159. Alk phos 128. LFTs continue to improve.   --- 10/26/16: AST 84 and ALT 139. Alk phos 114. LFTs continue to improve.   --- 11/23/16:  AST 64 and ALT 102. Alk phos 96. LFTs continue to improve.   ??  **Bone Health:  - 03/31/16: DEXA scan showed osteoporosis of the spine and osteopenia of the femoral neck. Zometa and Ca/Vit D.   - 04/02/16: Received 1st dose of Zometa.   - 07/06/16. 2nd dose of Zometa. Completed. Continue Ca/Vit D supplement.   - 11/23/16: Consider continuation of Zometa at 1 year from the first dose - 03/2017.   ??  Plan: Christine [name] is 9 mo out of her allo-SCT. She has always been a full donor chimera is unfractionated compartment but a mixed chimera in CD3 compartment. The CD3 chimerism continues to improve and today she is 95%!. Improvement in chimerism coincided with mild transaminitis, which is also improving.    - RTC in  1 month.  ??     Willaim Bane  11/26/2016  9:04 PM

## 2016-12-17 MED FILL — VALACYCLOVIR/500MG/TAB: VALACYCLOVIR/500MG/TAB | 30 days supply | Qty: 30 | Fill #10

## 2016-12-22 ENCOUNTER — Ambulatory Visit: Admission: RE | Admit: 2016-12-22 | Discharge: 2016-12-22 | Disposition: A | Discharge status: Home / Self Care

## 2016-12-22 ENCOUNTER — Ambulatory Visit
Admission: RE | Admit: 2016-12-22 | Discharge: 2016-12-22 | Disposition: A | Discharge status: Home / Self Care | Payer: BC Managed Care – PPO

## 2016-12-22 DIAGNOSIS — Z9484 Stem cells transplant status: Principal | ICD-10-CM

## 2016-12-22 DIAGNOSIS — I427 Cardiomyopathy due to drug and external agent: Secondary | ICD-10-CM

## 2016-12-22 DIAGNOSIS — C9201 Acute myeloblastic leukemia, in remission: Secondary | ICD-10-CM

## 2016-12-22 DIAGNOSIS — Z9481 Bone marrow transplant status: Secondary | ICD-10-CM

## 2016-12-22 LAB — CBC W/ AUTO DIFF
BASOPHILS ABSOLUTE COUNT: 0 10*9/L (ref 0.0–0.1)
EOSINOPHILS ABSOLUTE COUNT: 0.1 10*9/L (ref 0.0–0.4)
HEMATOCRIT: 37.7 % (ref 36.0–46.0)
HEMOGLOBIN: 12.9 g/dL (ref 12.0–16.0)
LARGE UNSTAINED CELLS: 4 % (ref 0–4)
LYMPHOCYTES ABSOLUTE COUNT: 0.5 10*9/L — ABNORMAL LOW (ref 1.5–5.0)
MEAN CORPUSCULAR HEMOGLOBIN CONC: 34.3 g/dL (ref 31.0–37.0)
MEAN CORPUSCULAR HEMOGLOBIN: 34.2 pg — ABNORMAL HIGH (ref 26.0–34.0)
MEAN CORPUSCULAR VOLUME: 99.7 fL (ref 80.0–100.0)
MEAN PLATELET VOLUME: 7.1 fL (ref 7.0–10.0)
MONOCYTES ABSOLUTE COUNT: 0.2 10*9/L (ref 0.2–0.8)
NEUTROPHILS ABSOLUTE COUNT: 1.8 10*9/L — ABNORMAL LOW (ref 2.0–7.5)
PLATELET COUNT: 202 10*9/L (ref 150–440)
RED CELL DISTRIBUTION WIDTH: 13.3 % (ref 12.0–15.0)

## 2016-12-22 LAB — COMPREHENSIVE METABOLIC PANEL
ALBUMIN: 4.5 g/dL (ref 3.5–5.0)
ALKALINE PHOSPHATASE: 79 U/L (ref 38–126)
ALT (SGPT): 72 U/L — ABNORMAL HIGH (ref 15–48)
ANION GAP: 7 mmol/L — ABNORMAL LOW (ref 9–15)
AST (SGOT): 51 U/L — ABNORMAL HIGH (ref 14–38)
BILIRUBIN TOTAL: 0.5 mg/dL (ref 0.0–1.2)
BLOOD UREA NITROGEN: 12 mg/dL (ref 7–21)
BUN / CREAT RATIO: 17
CALCIUM: 10.1 mg/dL (ref 8.5–10.2)
CHLORIDE: 106 mmol/L (ref 98–107)
EGFR MDRD AF AMER: >=60 mL/min/{1.73_m2} (ref >=60)
EGFR MDRD NON AF AMER: >=60 mL/min/{1.73_m2} (ref >=60)
GLUCOSE RANDOM: 86 mg/dL (ref 65–179)
POTASSIUM: 4.2 mmol/L (ref 3.5–5.0)
PROTEIN TOTAL: 6.6 g/dL (ref 6.5–8.3)
SODIUM: 142 mmol/L (ref 135–145)

## 2016-12-22 LAB — MACROCYTES

## 2016-12-22 LAB — EGFR MDRD NON AF AMER: Glomerular filtration rate/1.73 sq M.predicted.non black:ArVRat:Pt:Ser/Plas/Bld:Qn:Creatinine-based formula (MDRD): >=60

## 2016-12-22 NOTE — Unmapped (Signed)
Pt tolerated inhalation well  Left infusion area ambulatory in NAD

## 2016-12-22 NOTE — Unmapped (Signed)
BMT Clinic Follow-up Note    Patient Name: Christine [name]  Medical Record Number:  [id number]  Encounter Date: 12/22/2016    BMT Attending MD:  Lanae Boast, MD.     HPI: Christine [name] is a 61 y.o. 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 D +302 days. Tac stop date 06/15/16 (6 months ago).      Interval History:  Christine [name] is feeling well today with no new complaints. Her energy has been good. She denies any nausea, vomiting, or diarrhea. She has no new rashes. She denies any fever, chills, SOB, chest pain, bleeding, bruising, or infectious symptoms. She continues to have a good appetite and her weight is stable today. She is drinking well and her renal function is good today.     PMH, PSH, and PSocH: Reviewed and there are no changes.     Allergies:  Dapsone; Penicillins; Rasburicase; Sulfasalazine; Vancomycin; Vancomycin analogues; Adhesive; Amoxicillin; Cefepime; Morphine; and Tapentadol   Medications: Reviewed and updated in EPIC.     Review of Systems:  A comprehensive ROS is negative except for as mentioned above in HPI.     Objective:  VS:BP 127/76  - Pulse 86  - Temp 36.4 ??C (97.5 ??F) (Oral)  - Resp 18  - Ht 162.5 cm (5' 3.98)  - Wt 58.3 kg (128 lb 8.5 oz)  - LMP  (LMP Unknown)  - SpO2 100%  - BMI 22.08 kg/m??     KPS: 80%, Normal activity with effort; some signs or symptoms of disease (ECOG equivalent 1)    Acute Graft versus Host Disease Assessment: 12/22/2016  Organ stage: 0  Skin stage 0, No Rash, none, 0 % BSA;   Upper GI stage 0, no nausea and no vomiting;   Lower GI stage 0, No Diarrhea;   Hepatic stage 0, Bilirubin normal - 2mg /dL    Overall grade (Current) None     Physical Exam:   General appearance: Well appearing female in no acute distress.   Head: Alopecia, wears wig. Normal, non-tender sinuses.  Eyes: PERRL, EOMs intact, conjunctiva and sclera clear.   Neck: Supple, no palpable LAD  Lungs: CTAB. No wheezes, crackles, or rhonchi.   Heart: RRR. Normal s1/s2. No murmur, rub, or gallop.  Abdomen: Soft, non-tender, normoactive BS, no organomegaly.   Extremities: No edema. Pulses palpable in all extremities.   MSK: No joint deformity, tenderness, or swelling. FROM  Skin: Warm, dry, and intact. No rashes.   Line: R PAC accessed. Non-tender and no notable erythema.     Test Results:  Lab Results   Component Value Date    WBC 2.7 (L) 12/22/2016    HGB 12.9 12/22/2016    HCT 37.7 12/22/2016    PLT 202 12/22/2016       Lab Results   Component Value Date    NA 142 12/22/2016    K 4.2 12/22/2016    CL 106 12/22/2016    CO2 29.0 12/22/2016    BUN 12 12/22/2016    CREATININE 0.72 12/22/2016    GLU 86 12/22/2016    CALCIUM 10.1 12/22/2016    MG 1.8 07/26/2016    PHOS 3.0 04/15/2016       Lab Results   Component Value Date    BILITOT 0.5 12/22/2016    BILIDIR <0.10 04/15/2016    PROT 6.6 12/22/2016    ALBUMIN 4.5 12/22/2016  ALT 72 (H) 12/22/2016    AST 51 (H) 12/22/2016    ALKPHOS 79 12/22/2016    GGT 115 (H) 08/10/2016     ASSESSMENT / IMPRESSION:  **AML:??(FLT3-ITD, +11, and IDH2)??in CR prior to transplant.   06/22/16: BMBx:   - ??Normocellular bone marrow (40%) with trilineage hematopoiesis and 3% blasts.  - ??Routine cytogenetics: donor karyotype; FISH (trisomy 11) results are normal.  - ??Flow cytometric MRD results reveal no evidence of aberrant myeloid antigen expression or abnormal myeloblasts.  - ??DNA Chimerism see below.  - Considered maintenance with FLT3 inhibitor??(sorafenib). Was reluctant to start around D+45-60 due to pancytopenia and changing chimerism. Decided against it. Now she is 10 months after transplant. Discussed maintenance again - decided against it.   ??  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 ??  Conditioning:??  ????????????????????????Busulfan PK calculated from test dose 80 mg/hr daily and Fludarabine 40 mg/m2 daily ??day -5 thru day -2  ????????????????????????Day 0 -??02/24/16  Cell dose:??3.24 x10^8 TNC (Marrow)  Chimerisms/Marrow:  03/31/16: Day 30 marrow normocellular w/1% blasts. FISH and cytos are normal. MRD negative.   BM Chimerisms show 75% donor CD3 and >95% donor in unfractionated compartment.   04/22/16: PB Chimerisms show 71% donor CD3 and >95% donor unfractionated. 05/05/16: BM Chimerisms show 65% donor CD3 and >95% donor unfractionated.   05/18/16: PB Chimerisms show 74% donor CD3 and 95% donor unfractionated.   06/01/16: PB Chimerisms show 69% donor CD3 and >95% donor unfractionated.  06/22/16: PB Chimerisms show 80% donor CD3 and >95% donor unfractionated  06/22/16: BM Chimerisms show 79% donor CD3 and >95% donor unfractionated.   07/06/16: PB Chimerisms show 77% donor CD3 and >95% donor unfractionated.  07/13/16: Off tac x 4 weeks.??Chimerism check next visit.  If no full chimerism at 8-10 weeks after discontinuation of tac will consider DLI. We do not have any donor cells stored. If we need to recollect for DLI, will consider mobilizing the donor and collecting CD3+ cells for DLI as well as CD34+ cells for another transplant.   07/26/16: Off tac almost 6 weeks. PB Chimerisms show 74% donor in CD3 and >95% donor unfractionated.   08/10/16: Off tac 8 weeks. Repeat DNA shows slight increase in CD3 to 79%. Plan after discussion with Dr. Oswald Hillock to re-collect sister for more donor cells for DLI. However, today with??elevated LFTs, this is new, will need to follow these and make sure no GVHD prior to contacting sister for further donation. This was discussed with Christine [name] today.   08/18/16: Off tac 9 weeks.??PB Chimerisms show??85% donor CD3 and >95% donor unfractionated.  08/24/16: Off tac 10 weeks. BM Chimerisms show 79% donor CD3 and >95% donor unfractionated.  09/07/16: Off tac 12 weeks. PB Chimerisms show 91% donor CD3 and >95% donor unfractionated.??  09/28/16: PB chimerism 94% donor CD3 and >95% donor unfractionated.  10/26/16: PB chimerism 90% donor CD3 and >95% donor unfractionated.??  11/23/16: PB chimerism 95% donor CD3 and >95% donor unfractionated.??  12/22/16: PB chimerism pending.  ??  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   - Methotrexate on day +1, 3, 6, 11 (received all 4 doses)  - Tac tapered and eventually DC'd 06/15/16????  - ??Has had no e/o GVHD,developed new elevated LFTs noted at visit on 7/31. Liver US(08/18/16): 1.Patent hepatic vasculature with normal flow direction 2.Large simple hepatic cysts.3. Stone in gallbladder.   - 12/22/16: LFTs continue to improve.   ??  Heme:??CBC stable.   Transfusion  criteria: * Pre-med with tylenol and Zyrtec pre- blood or platelet transfusion.   - Hgb <8 and Plt <10K or bleeding.   - Granix if ANC <0.5.   ??  Hx of DVT:  - Completed 6 mos or anticoagulation.     **ID:??  Prophylaxis:   - Continue Valtrex for Zoster prophy 500 mg qd.   - Bactrim stopped due to pancytopenia. Allergic to dapsone. Switched to Pentamidine (Dose #1 - 5/8). Continues on monthly Pentamidine given low CD4 count. - 11/23/16: Pentamidine last given 10/26/16. Needs another dose today.   Pentamidine due today   ??  Low CD4  - 06/30/16: Off IS. CD4 count 126.   - 09/07/16: CD4 count 137. ??  - 09/28/16: Received Pentamidine today. Plan to check CD4 level at next visit and continue with monthly pentamidine.   - 10/26/16: CD4 count 180. Continue monthly pentamidine.   - 11/23/16: CD4 count 167. Continue monthly pentamidine.   - 12/22/16:  Continue monthly pentamidine (today).  ??  Viral studies:  - 11/23/16: Viral panels have been negative. Will stop monitoring unless clinically indicated.    ??  **CV:   Hx of anthracycline induced cardiomyopathy:  - Nov 2017 w/EF 35%. This has now resolved with 01/2016 Echo showing estimated EF of 50%.   - Treated with lisinopril and metoprolol (Metoprolol 50mg  XL).   - Now off all antihypertensives due to intolerance (hypotension).   - 12/22/16: BP WNL. Asymptomatic. See below.   ??  HTN:??Lisinopril stopped d/t AKI.   - 04/07/16: D/C'd Norvasc. Due to asymptomatic hypotension. Pre-transplant blood pressures run in the 100/70-80 range per her report.   Per Dr. Barbette Merino, ok to stop metoprolol given low BPs with plan to repeat Echo in 6-8 weeks.  - 07/06/16: Baseline ECHO today showed EF of 45-50%. DC metoprolol.   - 07/13/16: Follow BP. Will repeat ECHO 8-12 weeks after stopping metoprolol. Repeat ECHO scheduled for 10/26/2016.  - 10/26/16: Repeat ECHO showed normal EF. Ms. Loseke saw Ramond Marrow who decided not to restart ACE inhibitor or beta blocker. Recommended annual follow up with ECHO of the heart.    ??  **GI/Hepatic:??  - Completed VOD prophylaxis.   - Elevated LFTs: Mild elevation at visit on 07/26/16. 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   --- 08/10/16 AST and ALT now 107/170 respectively. Normal amylase and lipase. No abdominal pain or GI symptoms.   --- 08/13/16 ????AST 191 and ALT 283. Alk phos 177.  --- 08/18/16 ??AST 144 and ALT 264. Alk phos 218. Liver US: with patent hepatic vasculature notable for large simple hepatic cyst and gallstones.  --- 09/07/16: AST 136 and ALT 242. Alk phos 197. Stable.   --- 09/28/16: AST 91 and ALT 159. Alk phos 128. LFTs continue to improve.   --- 10/26/16: AST 84 and ALT 139. Alk phos 114. LFTs continue to improve.   --- 11/23/16:  AST 64 and ALT 102. Alk phos 96. LFTs continue to improve.   --- 12/22/16:  AST 51 and ALT 72. Alk phos 79. LFTs continue to improve.   ??  **Bone Health:  - 03/31/16: DEXA scan showed osteoporosis of the spine and osteopenia of the femoral neck. Zometa and Ca/Vit D.   - 04/02/16: Received 1st dose of Zometa.   - 07/06/16. 2nd dose of Zometa. Completed. Continue Ca/Vit D supplement.   - 11/23/16: Consider continuation of Zometa at 1 year from the first dose - 03/2017.   ??  Plan: Aadhya is 10 mo out of  her allo-SCT. She has always been a full donor chimera is unfractionated compartment but a mixed chimera in CD3 compartment. The CD3 chimerism continues to improve: last month was 95%, today's is pending. Improvement in chimerism coincided with mild transaminitis, which is also improving.    - RTC in 4 weeks.       Willaim Bane  12/22/2016  10:27 AM

## 2017-01-14 MED FILL — VALACYCLOVIR/500MG/TAB: VALACYCLOVIR/500MG/TAB | 30 days supply | Qty: 30 | Fill #11

## 2017-01-18 ENCOUNTER — Other Ambulatory Visit: Admit: 2017-01-18 | Discharge: 2017-01-18 | Payer: PRIVATE HEALTH INSURANCE

## 2017-01-18 ENCOUNTER — Ambulatory Visit: Admit: 2017-01-18 | Discharge: 2017-01-18 | Payer: PRIVATE HEALTH INSURANCE

## 2017-01-18 DIAGNOSIS — Z9484 Stem cells transplant status: Principal | ICD-10-CM

## 2017-01-18 DIAGNOSIS — C9201 Acute myeloblastic leukemia, in remission: Principal | ICD-10-CM

## 2017-01-18 DIAGNOSIS — Z9481 Bone marrow transplant status: Secondary | ICD-10-CM

## 2017-01-18 LAB — CBC W/ AUTO DIFF
EOSINOPHILS ABSOLUTE COUNT: 0.1 10*9/L (ref 0.0–0.4)
HEMATOCRIT: 35.6 % — ABNORMAL LOW (ref 36.0–46.0)
HEMOGLOBIN: 12.3 g/dL (ref 12.0–16.0)
LARGE UNSTAINED CELLS: 6 % — ABNORMAL HIGH (ref 0–4)
LYMPHOCYTES ABSOLUTE COUNT: 0.7 10*9/L — ABNORMAL LOW (ref 1.5–5.0)
MEAN CORPUSCULAR HEMOGLOBIN CONC: 34.6 g/dL (ref 31.0–37.0)
MEAN PLATELET VOLUME: 7 fL (ref 7.0–10.0)
MONOCYTES ABSOLUTE COUNT: 0.3 10*9/L (ref 0.2–0.8)
NEUTROPHILS ABSOLUTE COUNT: 1.7 10*9/L — ABNORMAL LOW (ref 2.0–7.5)
PLATELET COUNT: 203 10*9/L (ref 150–440)
RED BLOOD CELL COUNT: 3.55 10*12/L — ABNORMAL LOW (ref 4.00–5.20)
RED CELL DISTRIBUTION WIDTH: 13.6 % (ref 12.0–15.0)
WBC ADJUSTED: 3 10*9/L — ABNORMAL LOW (ref 4.5–11.0)

## 2017-01-18 LAB — COMPREHENSIVE METABOLIC PANEL
ALBUMIN: 4.4 g/dL (ref 3.5–5.0)
ALT (SGPT): 53 U/L — ABNORMAL HIGH (ref 15–48)
ANION GAP: 6 mmol/L — ABNORMAL LOW (ref 9–15)
AST (SGOT): 42 U/L — ABNORMAL HIGH (ref 14–38)
BILIRUBIN TOTAL: 0.4 mg/dL (ref 0.0–1.2)
BLOOD UREA NITROGEN: 14 mg/dL (ref 7–21)
BUN / CREAT RATIO: 18
CALCIUM: 9.9 mg/dL (ref 8.5–10.2)
CHLORIDE: 108 mmol/L — ABNORMAL HIGH (ref 98–107)
CO2: 30 mmol/L (ref 22.0–30.0)
EGFR MDRD AF AMER: >=60 mL/min/{1.73_m2} (ref >=60)
EGFR MDRD NON AF AMER: >=60 mL/min/{1.73_m2} (ref >=60)
GLUCOSE RANDOM: 77 mg/dL (ref 65–179)
POTASSIUM: 5.4 mmol/L — ABNORMAL HIGH (ref 3.5–5.0)
PROTEIN TOTAL: 6.4 g/dL — ABNORMAL LOW (ref 6.5–8.3)
SODIUM: 144 mmol/L (ref 135–145)

## 2017-01-18 LAB — EOSINOPHILS ABSOLUTE COUNT: Lab: 0.1

## 2017-01-18 LAB — PROTEIN TOTAL: Protein:MCnc:Pt:Ser/Plas:Qn:: 6.4 — ABNORMAL LOW

## 2017-01-18 NOTE — Unmapped (Signed)
If you feel like you're having an emergency please call 911.  For appointments or questions Monday through Friday 8AM-5PM please call [phone] or Toll Free [phone]. For Medical questions or concerns ask for the Nurse Triage Line.  On Nights, Weekends, and Holidays call [phone] and ask for the Oncologist on Call.  Reasons to call the Nurse Triage Line:  Fever of 100.5 or greater  Nausea and/or vomiting not relived with nausea medicine  Diarrhea or constipation  Severe pain not relieved with usual pain regimen  Shortness of breath  Uncontrolled bleeding  Mental status changes

## 2017-01-18 NOTE — Unmapped (Signed)
Escorted patient ambulatory in NAD to room 8 1206 Pt tolerated inhalation treatment well. AVS printed and given to patient.  Pt left infusion center ambulatory in NAD.

## 2017-01-18 NOTE — Unmapped (Signed)
BMT Clinic Follow-up Note    Patient Name: Christine [name]  Medical Record Number:  [id number]  Encounter Date: 01/18/2017    BMT Attending MD:  Lanae Boast, MD.     HPI: Christine [name] is a 62 y.o. 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 D +329 days. Tac stop date 06/15/16 (7 months ago).      Interval History:  Christine [name] is feeling well today with no new complaints. Her energy has been good. She denies any nausea, vomiting, or diarrhea. She has no new rashes. She denies any fever, chills, SOB, chest pain, bleeding, bruising, or infectious symptoms. She continues to have a good appetite and her weight is stable today. She is drinking well and her renal function is good today.     PMH, PSH, and PSocH: Reviewed and there are no changes.     Allergies:  Dapsone; Penicillins; Rasburicase; Sulfasalazine; Vancomycin; Vancomycin analogues; Adhesive; Amoxicillin; Cefepime; Morphine; and Tapentadol   Medications: Reviewed and updated in EPIC.     Review of Systems:  A comprehensive ROS is negative except for as mentioned above in HPI.     Objective:  VS:BP 117/75  - Pulse 76  - Temp 36.4 ??C (97.5 ??F) (Oral)  - Resp 16  - Ht 162.5 cm (5' 3.98)  - Wt 60.3 kg (132 lb 14.4 oz)  - LMP  (LMP Unknown)  - SpO2 100%  - BMI 22.83 kg/m??     KPS: 80%, Normal activity with effort; some signs or symptoms of disease (ECOG equivalent 1)    Acute Graft versus Host Disease Assessment: 01/18/2017  Organ stage: 0  Skin stage 0, No Rash, none, 0 % BSA;   Upper GI stage 0, no nausea and no vomiting;   Lower GI stage 0, No Diarrhea;   Hepatic stage 0, Bilirubin normal - 2mg /dL    Overall grade (Current) None     Physical Exam:   General appearance: Well appearing female in no acute distress.   Head: Alopecia, wears wig. Normal, non-tender sinuses.  Eyes: PERRL, EOMs intact, conjunctiva and sclera clear.   Neck: Supple, no palpable LAD Lungs: CTAB. No wheezes, crackles, or rhonchi.   Heart: RRR. Normal s1/s2. No murmur, rub, or gallop.  Abdomen: Soft, non-tender, normoactive BS, no organomegaly.   Extremities: No edema. Pulses palpable in all extremities.   MSK: No joint deformity, tenderness, or swelling. FROM  Skin: Warm, dry, and intact. No rashes.   Line: R PAC accessed. Non-tender and no notable erythema.     Test Results:  Lab Results   Component Value Date    WBC 3.0 (L) 01/18/2017    HGB 12.3 01/18/2017    HCT 35.6 (L) 01/18/2017    PLT 203 01/18/2017       Lab Results   Component Value Date    NA 144 01/18/2017    K 5.4 (H) 01/18/2017    CL 108 (H) 01/18/2017    CO2 30.0 01/18/2017    BUN 14 01/18/2017    CREATININE 0.80 01/18/2017    GLU 77 01/18/2017    CALCIUM 9.9 01/18/2017    MG 1.8 07/26/2016    PHOS 3.0 04/15/2016       Lab Results   Component Value Date    BILITOT 0.4 01/18/2017    BILIDIR <0.10 04/15/2016    PROT 6.4 (L) 01/18/2017    ALBUMIN  4.4 01/18/2017    ALT 53 (H) 01/18/2017    AST 42 (H) 01/18/2017    ALKPHOS 75 01/18/2017    GGT 115 (H) 08/10/2016     ASSESSMENT / IMPRESSION:  **AML:??(FLT3-ITD, +11, and IDH2)??in CR prior to transplant.   06/22/16: BMBx:   - ??Normocellular bone marrow (40%) with trilineage hematopoiesis and 3% blasts.  - ??Routine cytogenetics: donor karyotype; FISH (trisomy 11) results are normal.  - ??Flow cytometric MRD results reveal no evidence of aberrant myeloid antigen expression or abnormal myeloblasts.  - ??DNA Chimerism see below.  - Considered maintenance with FLT3 inhibitor??(sorafenib). Was reluctant to start around D+45-60 due to pancytopenia and changing chimerism. Decided against it. Now she is 10 months after transplant. Discussed maintenance again - decided against it.   ??  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 ??  Conditioning:??  ????????????????????????Busulfan PK calculated from test dose 80 mg/hr daily and Fludarabine 40 mg/m2 daily ??day -5 thru day -2  ????????????????????????Day 0 -??02/24/16  Cell dose:??3.24 x10^8 TNC (Marrow)  Chimerisms/Marrow:  03/31/16: Day 30 marrow normocellular w/1% blasts. FISH and cytos are normal. MRD negative.   BM Chimerisms show 75% donor CD3 and >95% donor in unfractionated compartment.   04/22/16: PB Chimerisms show 71% donor CD3 and >95% donor unfractionated. 05/05/16: BM Chimerisms show 65% donor CD3 and >95% donor unfractionated.   05/18/16: PB Chimerisms show 74% donor CD3 and 95% donor unfractionated.   06/01/16: PB Chimerisms show 69% donor CD3 and >95% donor unfractionated.  06/22/16: PB Chimerisms show 80% donor CD3 and >95% donor unfractionated  06/22/16: BM Chimerisms show 79% donor CD3 and >95% donor unfractionated.   07/06/16: PB Chimerisms show 77% donor CD3 and >95% donor unfractionated.  07/13/16: Off tac x 4 weeks.??Chimerism check next visit.  If no full chimerism at 8-10 weeks after discontinuation of tac will consider DLI. We do not have any donor cells stored. If we need to recollect for DLI, will consider mobilizing the donor and collecting CD3+ cells for DLI as well as CD34+ cells for another transplant.   07/26/16: Off tac almost 6 weeks. PB Chimerisms show 74% donor in CD3 and >95% donor unfractionated.   08/10/16: Off tac 8 weeks. Repeat DNA shows slight increase in CD3 to 79%. Plan after discussion with Dr. Oswald Hillock to re-collect sister for more donor cells for DLI. However, today with??elevated LFTs, this is new, will need to follow these and make sure no GVHD prior to contacting sister for further donation. This was discussed with Christine [name] today.   08/18/16: Off tac 9 weeks.??PB Chimerisms show??85% donor CD3 and >95% donor unfractionated.  08/24/16: Off tac 10 weeks. BM Chimerisms show 79% donor CD3 and >95% donor unfractionated.  09/07/16: Off tac 12 weeks. PB Chimerisms show 91% donor CD3 and >95% donor unfractionated.??  09/28/16: PB chimerism 94% donor CD3 and >95% donor unfractionated.  10/26/16: PB chimerism 90% donor CD3 and >95% donor unfractionated.??  11/23/16: PB chimerism 95% donor CD3 and >95% donor unfractionated.??  12/22/16: PB chimerism 94% donor CD3 and >95% donor unfractionated.??  01/18/17: Pending.   ??  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   - Methotrexate on day +1, 3, 6, 11 (received all 4 doses)  - Tac tapered and eventually DC'd 06/15/16????  - ??Has had no e/o GVHD,developed new elevated LFTs noted at visit on 7/31. Liver US(08/18/16): 1.Patent hepatic vasculature with normal flow direction 2.Large simple hepatic cysts.3. Stone in gallbladder.   -  01/18/17: LFTs continue to improve.   ??  Heme:??CBC stable.   Transfusion criteria: * Pre-med with tylenol and Zyrtec pre- blood or platelet transfusion.   - Hgb <8 and Plt <10K or bleeding.   - Granix if ANC <0.5.   ??  Hx of DVT:  - Completed 6 mos or anticoagulation.     **ID:??  Prophylaxis:   - Continue Valtrex for Zoster prophy 500 mg qd.   - Bactrim stopped due to pancytopenia. Allergic to dapsone. Switched to Pentamidine (Dose #1 - 5/8). Continues on monthly Pentamidine given low CD4 count.   ??  Low CD4  - 06/30/16: Off IS. CD4 count 126.   - 09/07/16: CD4 count 137. ??  - 09/28/16: Received Pentamidine today. Plan to check CD4 level at next visit and continue with monthly pentamidine.   - 10/26/16: CD4 count 180. Continue monthly pentamidine.   - 11/23/16: CD4 count 167. Continue monthly pentamidine.   - 12/22/16: Continue monthly pentamidine (today).  - 01/19/16: Pentamidine due today.    ??  Viral studies:  - 11/23/16: Viral panels have been negative. Will stop monitoring unless clinically indicated.      Vaccine:   09/07/16 Next set due in the end of February - next visit   Will also consider Shingrix - likely after her immunity is a bit better.    ??  **CV:   Hx of anthracycline induced cardiomyopathy:  - Nov 2017 w/EF 35%. This has now resolved with 01/2016 Echo showing estimated EF of 50%.   - Treated with lisinopril and metoprolol (Metoprolol 50mg  XL).   - Now off all antihypertensives due to intolerance (hypotension).   - 01/18/17: BP WNL. Asymptomatic. See below.   ??  HTN:??Lisinopril stopped d/t AKI.   - 04/07/16: D/C'd Norvasc. Due to asymptomatic hypotension. Pre-transplant blood pressures run in the 100/70-80 range per her report.   Per Dr. Barbette Merino, ok to stop metoprolol given low BPs with plan to repeat Echo in 6-8 weeks.  - 07/06/16: Baseline ECHO today showed EF of 45-50%. DC metoprolol.   - 07/13/16: Follow BP. Will repeat ECHO 8-12 weeks after stopping metoprolol. Repeat ECHO scheduled for 10/26/2016.  - 10/26/16: Repeat ECHO showed normal EF. Christine [name] saw Ramond Marrow who decided not to restart ACE inhibitor or beta blocker. Recommended annual follow up with ECHO of the heart.    ??  **GI/Hepatic:??  - Completed VOD prophylaxis.   - Elevated LFTs: Mild elevation at visit on 07/26/16. 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   --- 08/10/16 AST and ALT now 107/170 respectively. Normal amylase and lipase. No abdominal pain or GI symptoms.   --- 08/13/16 ????AST 191 and ALT 283. Alk phos 177.  --- 08/18/16 ??AST 144 and ALT 264. Alk phos 218. Liver US: with patent hepatic vasculature notable for large simple hepatic cyst and gallstones.  --- 09/07/16: AST 136 and ALT 242. Alk phos 197. Stable.   --- 09/28/16: AST 91 and ALT 159. Alk phos 128. LFTs continue to improve.   --- 10/26/16: AST 84 and ALT 139. Alk phos 114. LFTs continue to improve.   --- 11/23/16:  AST 64 and ALT 102. Alk phos 96. LFTs continue to improve.   --- 12/22/16:  AST 51 and ALT 72. Alk phos 79. LFTs continue to improve.   --- 01/18/17: AST 42 and ALT 53. Alk phos 75. LFTs continue to improve.   ??  **Bone Health:  - 03/31/16: DEXA scan showed osteoporosis of the spine and osteopenia of  the femoral neck. Zometa and Ca/Vit D.   - 04/02/16: Received 1st dose of Zometa.   - 07/06/16. 2nd dose of Zometa. Completed. Continue Ca/Vit D supplement.   - 11/23/16: Consider continuation of Zometa at 1 year from the first dose - 03/2017.   ??  Plan: Christine [name] is 11 mo out of her allo-SCT. She has always been a full donor chimera is unfractionated compartment but a mixed chimera in CD3 compartment. The CD3 chimerism stabilized around 94%. Transaminitis coincided with improvement in chimerism - also improving.    - RTC in 4 weeks.       Willaim Bane  01/18/2017  10:26 AM

## 2017-02-11 MED ORDER — VALACYCLOVIR 500 MG TABLET
ORAL_TABLET | ORAL | PRN refills | 0.00000 days | Status: CP
Start: 2017-02-11 — End: 2017-02-11

## 2017-02-11 MED ORDER — VALACYCLOVIR 500 MG TABLET: tablet | 0 refills | 0 days | Status: AC

## 2017-02-11 MED ORDER — VALACYCLOVIR 500 MG TABLET: each | 99 refills | 0 days

## 2017-02-11 MED FILL — VALACYCLOVIR/500MG/TAB: VALACYCLOVIR/500MG/TAB | 30 days supply | Qty: 30 | Fill #0

## 2017-02-15 ENCOUNTER — Ambulatory Visit: Admit: 2017-02-15 | Discharge: 2017-02-16 | Payer: PRIVATE HEALTH INSURANCE

## 2017-02-15 ENCOUNTER — Other Ambulatory Visit: Admit: 2017-02-15 | Discharge: 2017-02-16 | Payer: PRIVATE HEALTH INSURANCE

## 2017-02-15 DIAGNOSIS — Z9481 Bone marrow transplant status: Secondary | ICD-10-CM

## 2017-02-15 DIAGNOSIS — Z9484 Stem cells transplant status: Secondary | ICD-10-CM

## 2017-02-15 DIAGNOSIS — D899 Disorder involving the immune mechanism, unspecified: Secondary | ICD-10-CM

## 2017-02-15 DIAGNOSIS — C9201 Acute myeloblastic leukemia, in remission: Secondary | ICD-10-CM

## 2017-02-15 LAB — COMPREHENSIVE METABOLIC PANEL
ALBUMIN: 4.5 g/dL (ref 3.5–5.0)
ALKALINE PHOSPHATASE: 71 U/L (ref 38–126)
ANION GAP: 9 mmol/L (ref 9–15)
AST (SGOT): 47 U/L — ABNORMAL HIGH (ref 14–38)
BLOOD UREA NITROGEN: 14 mg/dL (ref 7–21)
BUN / CREAT RATIO: 19
CALCIUM: 9.7 mg/dL (ref 8.5–10.2)
CHLORIDE: 106 mmol/L (ref 98–107)
CO2: 27 mmol/L (ref 22.0–30.0)
CREATININE: 0.74 mg/dL (ref 0.60–1.00)
EGFR MDRD AF AMER: >=60 mL/min/{1.73_m2} (ref >=60)
EGFR MDRD NON AF AMER: >=60 mL/min/{1.73_m2} (ref >=60)
GLUCOSE RANDOM: 93 mg/dL (ref 65–179)
POTASSIUM: 4 mmol/L (ref 3.5–5.0)
PROTEIN TOTAL: 6.6 g/dL (ref 6.5–8.3)
SODIUM: 142 mmol/L (ref 135–145)

## 2017-02-15 LAB — LYMPH MARKER LIMITED,FLOW
ABSOLUTE CD3 CNT: 385 {cells}/uL — ABNORMAL LOW (ref 915–3400)
ABSOLUTE CD4 CNT: 188 {cells}/uL — ABNORMAL LOW (ref 510–2320)
ABSOLUTE CD8 CNT: 188 {cells}/uL (ref 180–1520)
CD3% (T CELLS)": 43 % — ABNORMAL LOW (ref 61–86)
CD8% T SUPPRESR": 21 % (ref 12–38)

## 2017-02-15 LAB — CBC W/ AUTO DIFF
EOSINOPHILS ABSOLUTE COUNT: 0.1 10*9/L (ref 0.0–0.4)
HEMATOCRIT: 37.6 % (ref 36.0–46.0)
HEMOGLOBIN: 12.8 g/dL (ref 12.0–16.0)
LARGE UNSTAINED CELLS: 3 % (ref 0–4)
LYMPHOCYTES ABSOLUTE COUNT: 0.8 10*9/L — ABNORMAL LOW (ref 1.5–5.0)
MEAN CORPUSCULAR HEMOGLOBIN CONC: 34.1 g/dL (ref 31.0–37.0)
MEAN CORPUSCULAR HEMOGLOBIN: 34.4 pg — ABNORMAL HIGH (ref 26.0–34.0)
MEAN CORPUSCULAR VOLUME: 100.7 fL — ABNORMAL HIGH (ref 80.0–100.0)
MEAN PLATELET VOLUME: 7.4 fL (ref 7.0–10.0)
MONOCYTES ABSOLUTE COUNT: 0.2 10*9/L (ref 0.2–0.8)
NEUTROPHILS ABSOLUTE COUNT: 2 10*9/L (ref 2.0–7.5)
PLATELET COUNT: 251 10*9/L (ref 150–440)
RED BLOOD CELL COUNT: 3.73 10*12/L — ABNORMAL LOW (ref 4.00–5.20)
WBC ADJUSTED: 3.2 10*9/L — ABNORMAL LOW (ref 4.5–11.0)

## 2017-02-15 LAB — CD4% (T HELPER)": Cells.CD3+CD4+/100 cells:NFr:Pt:Bld:Qn:: 21 — ABNORMAL LOW

## 2017-02-15 LAB — PLATELET COUNT: Lab: 251

## 2017-02-15 LAB — GLUCOSE RANDOM: Glucose:MCnc:Pt:Ser/Plas:Qn:: 93

## 2017-02-15 NOTE — Unmapped (Signed)
Encounter addended by: Carlena Sax, RN on: 02/15/2017  3:31 PM<BR>    Actions taken: Visit Navigator Flowsheet section accepted, Charge Capture section accepted

## 2017-02-15 NOTE — Unmapped (Signed)
Discharged from infusion area ambulatory with no complications noted post pentamidine.

## 2017-02-15 NOTE — Unmapped (Signed)
BMT Clinic Follow-up Note    Patient Name: Christine [name]  Medical Record Number:  [id number]  Encounter Date: 02/15/2017    BMT Attending MD:  Christine Boast, MD.     HPI: Christine [name] is a 62 y.o. 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 D +357 days. Tac stop date 06/15/16 (7 months ago).      Interval History:  Christine [name] is feeling well today with no new complaints. Her energy has been good. She denies any nausea, vomiting, or diarrhea. She has no new rashes. She denies any fever, chills, SOB, chest pain, bleeding, bruising, or infectious symptoms. She continues to have a good appetite and her weight is stable today. She is drinking well and her renal function is good today.     PMH, PSH, and PSocH: Reviewed and there are no changes.     Allergies:  Dapsone; Penicillins; Rasburicase; Sulfasalazine; Vancomycin; Vancomycin analogues; Adhesive; Amoxicillin; Cefepime; Morphine; and Tapentadol   Medications: Reviewed and updated in EPIC.     Review of Systems:  A comprehensive ROS is negative except for as mentioned above in HPI.     Objective:  VS:BP 115/76  - Pulse 85  - Temp 36.4 ??C (97.5 ??F) (Oral)  - Resp 16  - Ht 162.5 cm (5' 3.98)  - Wt 61.8 kg (136 lb 4.8 oz)  - LMP  (LMP Unknown)  - SpO2 100%  - BMI 23.41 kg/m??     KPS: 80%, Normal activity with effort; some signs or symptoms of disease (ECOG equivalent 1)    Acute Graft versus Host Disease Assessment: 02/15/2017  Organ stage: 0  Skin stage 0, No Rash, none, 0 % BSA;   Upper GI stage 0, no nausea and no vomiting;   Lower GI stage 0, No Diarrhea;   Hepatic stage 0, Bilirubin normal - 2mg /dL    Overall grade (Current) None     Physical Exam:   General appearance: Well appearing female in no acute distress.   Head: Alopecia, wears wig. Normal, non-tender sinuses.  Eyes: PERRL, EOMs intact, conjunctiva and sclera clear.   Neck: Supple, no palpable LAD Lungs: CTAB. No wheezes, crackles, or rhonchi.   Heart: RRR. Normal s1/s2. No murmur, rub, or gallop.  Abdomen: Soft, non-tender, normoactive BS, no organomegaly.   Extremities: No edema. Pulses palpable in all extremities.   MSK: No joint deformity, tenderness, or swelling. FROM  Skin: Warm, dry, and intact. No rashes.   Line: R PAC accessed. Non-tender and no notable erythema.     Test Results:  Lab Results   Component Value Date    WBC 3.2 (L) 02/15/2017    HGB 12.8 02/15/2017    HCT 37.6 02/15/2017    PLT 251 02/15/2017       Lab Results   Component Value Date    NA 142 02/15/2017    K 4.0 02/15/2017    CL 106 02/15/2017    CO2 27.0 02/15/2017    BUN 14 02/15/2017    CREATININE 0.74 02/15/2017    GLU 93 02/15/2017    CALCIUM 9.7 02/15/2017    MG 1.8 07/26/2016    PHOS 3.0 04/15/2016       Lab Results   Component Value Date    BILITOT 0.5 02/15/2017    BILIDIR <0.10 04/15/2016    PROT 6.6 02/15/2017    ALBUMIN 4.5 02/15/2017  ALT 53 (H) 02/15/2017    AST 47 (H) 02/15/2017    ALKPHOS 71 02/15/2017    GGT 115 (H) 08/10/2016     ASSESSMENT / IMPRESSION:  **AML:??(FLT3-ITD, +11, and IDH2)??in CR prior to transplant.   06/22/16: BMBx:   - ??Normocellular bone [name] (40%) with trilineage hematopoiesis and 3% blasts.  - ??Routine cytogenetics: donor karyotype; FISH (trisomy 11) results are normal.  - ??Flow cytometric MRD results reveal no evidence of aberrant myeloid antigen expression or abnormal myeloblasts.  - ??DNA Chimerism see below.  - Considered maintenance with FLT3 inhibitor??(sorafenib). Was reluctant to start around D+45-60 due to pancytopenia and changing chimerism. Decided against it. Now she is 10 months after transplant. Discussed maintenance again - decided against it.   ??  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 ??  Conditioning:??  ????????????????????????Busulfan PK calculated from test dose 80 mg/hr daily and Fludarabine 40 mg/m2 daily ??day -5 thru day -2  ????????????????????????Day 0 -??02/24/16  Cell dose:??3.24 x10^8 TNC ([name])  Chimerisms/[name]:  03/31/16: Day 30 [name] normocellular w/1% blasts. FISH and cytos are normal. MRD negative.   BM Chimerisms show 75% donor CD3 and >95% donor in unfractionated compartment.   04/22/16: PB Chimerisms show 71% donor CD3 and >95% donor unfractionated. 05/05/16: BM Chimerisms show 65% donor CD3 and >95% donor unfractionated.   05/18/16: PB Chimerisms show 74% donor CD3 and 95% donor unfractionated.   06/01/16: PB Chimerisms show 69% donor CD3 and >95% donor unfractionated.  06/22/16: PB Chimerisms show 80% donor CD3 and >95% donor unfractionated  06/22/16: BM Chimerisms show 79% donor CD3 and >95% donor unfractionated.   07/06/16: PB Chimerisms show 77% donor CD3 and >95% donor unfractionated.  07/13/16: Off tac x 4 weeks.??Chimerism check next visit.  If no full chimerism at 8-10 weeks after discontinuation of tac will consider DLI. We do not have any donor cells stored. If we need to recollect for DLI, will consider mobilizing the donor and collecting CD3+ cells for DLI as well as CD34+ cells for another transplant.   07/26/16: Off tac almost 6 weeks. PB Chimerisms show 74% donor in CD3 and >95% donor unfractionated.   08/10/16: Off tac 8 weeks. Repeat DNA shows slight increase in CD3 to 79%. Plan after discussion with Dr. Oswald Hillock to re-collect sister for more donor cells for DLI. However, today with??elevated LFTs, this is new, will need to follow these and make sure no GVHD prior to contacting sister for further donation. This was discussed with Christine [name] today.   08/18/16: Off tac 9 weeks.??PB Chimerisms show??85% donor CD3 and >95% donor unfractionated.  08/24/16: Off tac 10 weeks. BM Chimerisms show 79% donor CD3 and >95% donor unfractionated.  09/07/16: Off tac 12 weeks. PB Chimerisms show 91% donor CD3 and >95% donor unfractionated.??  09/28/16: PB chimerism 94% donor CD3 and >95% donor unfractionated.  10/26/16: PB chimerism 90% donor CD3 and >95% donor unfractionated.??  11/23/16: PB chimerism 95% donor CD3 and >95% donor unfractionated.??  12/22/16: PB chimerism 94% donor CD3 and >95% donor unfractionated.??  01/18/17: PB chimerism 93% donor CD3 and >95% donor unfractionated.??  02/15/17: Pending.   ??  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   - Methotrexate on day +1, 3, 6, 11 (received all 4 doses)  - Tac tapered and eventually DC'd 06/15/16????  - ??Has had no e/o GVHD,developed new elevated LFTs noted at visit on 7/31. Liver US(08/18/16): 1.Patent hepatic vasculature with normal flow direction 2.Large simple hepatic  cysts.3. Stone in gallbladder.   - 01/18/17: LFTs continue to improve.   - 02/15/17: LFT stable.   ??  Heme:??CBC stable.   Transfusion criteria: * Pre-med with tylenol and Zyrtec pre- blood or platelet transfusion.   - Hgb <8 and Plt <10K or bleeding.   - Granix if ANC <0.5.   ??  Hx of DVT:  - Completed 6 mos or anticoagulation.     **ID:??  Prophylaxis:   - Continue Valtrex for Zoster prophy 500 mg qd.   - Bactrim stopped due to pancytopenia. Allergic to dapsone. Switched to Pentamidine (Dose #1 - 5/8). Continues on monthly Pentamidine given low CD4 count.   ??  Low CD4  - 06/30/16: Off IS. CD4 count 126.   - 09/07/16: CD4 count 137. ??  - 09/28/16: Received Pentamidine today. Plan to check CD4 level at next visit and continue with monthly pentamidine.   - 10/26/16: CD4 count 180. Continue monthly pentamidine.   - 11/23/16: CD4 count 167. Continue monthly pentamidine.   - 12/22/16: Continue monthly pentamidine (today).  - 01/19/16: Pentamidine due today.    - 02/15/17: Repeat CD4 count 188. Continue pentamidine. Received dose today.   ????  Viral studies:  - 11/23/16: Viral panels have been negative. Will stop monitoring unless clinically indicated.      Vaccine:   09/07/16 Next set due in the end of February - next visit   Will also consider Shingrix - likely after her immunity is a bit better.    ??  **CV:   Hx of anthracycline induced cardiomyopathy:  - Nov 2017 w/EF 35%. This has now resolved with 01/2016 Echo showing estimated EF of 50%.   - Treated with lisinopril and metoprolol (Metoprolol 50mg  XL).   - Now off all antihypertensives due to intolerance (hypotension).   - 02/15/17: BP WNL. Asymptomatic. See below.   ??  HTN:??Lisinopril stopped d/t AKI.   - 04/07/16: D/C'd Norvasc. Due to asymptomatic hypotension. Pre-transplant blood pressures run in the 100/70-80 range per her report.   Per Dr. Barbette Merino, ok to stop metoprolol given low BPs with plan to repeat Echo in 6-8 weeks.  - 07/06/16: Baseline ECHO today showed EF of 45-50%. DC metoprolol.   - 07/13/16: Follow BP. Will repeat ECHO 8-12 weeks after stopping metoprolol. Repeat ECHO scheduled for 10/26/2016.  - 10/26/16: Repeat ECHO showed normal EF. Ms. [name] saw Christine [name] who decided not to restart ACE inhibitor or beta blocker. Recommended annual follow up with ECHO of the heart.    ??  **GI/Hepatic:??  - Completed VOD prophylaxis.   - Elevated LFTs: Mild elevation at visit on 07/26/16. 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   --- 08/10/16 AST and ALT now 107/170 respectively. Normal amylase and lipase. No abdominal pain or GI symptoms.   --- 08/13/16 ????AST 191 and ALT 283. Alk phos 177.  --- 08/18/16 ??AST 144 and ALT 264. Alk phos 218. Liver US: with patent hepatic vasculature notable for large simple hepatic cyst and gallstones.  --- 09/07/16: AST 136 and ALT 242. Alk phos 197. Stable.   --- 09/28/16: AST 91 and ALT 159. Alk phos 128. LFTs continue to improve.   --- 10/26/16: AST 84 and ALT 139. Alk phos 114. LFTs continue to improve.   --- 11/23/16:  AST 64 and ALT 102. Alk phos 96. LFTs continue to improve.   --- 12/22/16:  AST 51 and ALT 72. Alk phos 79. LFTs continue to improve.   --- 01/18/17: AST 42 and ALT 53.  Alk phos 75. LFTs continue to improve.    --- 02/15/17: AST 47 and ALT 53. Alk phos 71. Stable.   ??  **Bone Health:  - 03/31/16: DEXA scan showed osteoporosis of the spine and osteopenia of the femoral neck. Zometa and Ca/Vit D.   - 04/02/16: Received 1st dose of Zometa.   - 07/06/16. 2nd dose of Zometa. Completed. Continue Ca/Vit D supplement.   - 11/23/16: Consider continuation of Zometa at 1 year from the first dose - 03/2017.   ??  Plan: Christine [name] is almost 12 mo out of her allo-SCT. She has always been a full donor chimera is unfractionated compartment but a mixed chimera in CD3 compartment. The CD3 chimerism stabilized around 94%. Transaminitis coincided with improvement in chimerism - also improving.    - RTC in 4 weeks.       Willaim Bane  02/15/2017  10:55 AM

## 2017-03-11 MED FILL — VALACYCLOVIR/500MG/TAB: VALACYCLOVIR/500MG/TAB | 30 days supply | Qty: 30 | Fill #1

## 2017-03-15 ENCOUNTER — Ambulatory Visit: Admit: 2017-03-15 | Discharge: 2017-03-15 | Payer: PRIVATE HEALTH INSURANCE

## 2017-03-15 ENCOUNTER — Other Ambulatory Visit: Admit: 2017-03-15 | Discharge: 2017-03-15 | Payer: PRIVATE HEALTH INSURANCE

## 2017-03-15 DIAGNOSIS — D899 Disorder involving the immune mechanism, unspecified: Principal | ICD-10-CM

## 2017-03-15 DIAGNOSIS — C9201 Acute myeloblastic leukemia, in remission: Principal | ICD-10-CM

## 2017-03-15 DIAGNOSIS — Z9484 Stem cells transplant status: Principal | ICD-10-CM

## 2017-03-15 DIAGNOSIS — Z9481 Bone marrow transplant status: Secondary | ICD-10-CM

## 2017-03-15 LAB — CBC W/ AUTO DIFF
BASOPHILS ABSOLUTE COUNT: 0 10*9/L (ref 0.0–0.1)
BASOPHILS RELATIVE PERCENT: 0.3 %
EOSINOPHILS ABSOLUTE COUNT: 0.1 10*9/L (ref 0.0–0.4)
EOSINOPHILS RELATIVE PERCENT: 1.9 %
HEMOGLOBIN: 13 g/dL (ref 12.0–16.0)
LYMPHOCYTES ABSOLUTE COUNT: 0.5 10*9/L — ABNORMAL LOW (ref 1.5–5.0)
LYMPHOCYTES RELATIVE PERCENT: 12.7 %
MEAN CORPUSCULAR HEMOGLOBIN CONC: 33 g/dL (ref 31.0–37.0)
MEAN CORPUSCULAR HEMOGLOBIN: 32.9 pg (ref 26.0–34.0)
MEAN CORPUSCULAR VOLUME: 99.5 fL (ref 80.0–100.0)
MEAN PLATELET VOLUME: 6.9 fL — ABNORMAL LOW (ref 7.0–10.0)
MONOCYTES ABSOLUTE COUNT: 0.3 10*9/L (ref 0.2–0.8)
MONOCYTES RELATIVE PERCENT: 6.8 %
NEUTROPHILS ABSOLUTE COUNT: 3.1 10*9/L (ref 2.0–7.5)
NEUTROPHILS RELATIVE PERCENT: 76 %
RED BLOOD CELL COUNT: 3.97 10*12/L — ABNORMAL LOW (ref 4.00–5.20)
WBC ADJUSTED: 4.1 10*9/L — ABNORMAL LOW (ref 4.5–11.0)

## 2017-03-15 LAB — COMPREHENSIVE METABOLIC PANEL
ALBUMIN: 4.5 g/dL (ref 3.5–5.0)
ALKALINE PHOSPHATASE: 73 U/L (ref 38–126)
ALT (SGPT): 45 U/L (ref 15–48)
ANION GAP: 9 mmol/L (ref 9–15)
AST (SGOT): 38 U/L (ref 14–38)
BILIRUBIN TOTAL: 0.5 mg/dL (ref 0.0–1.2)
BLOOD UREA NITROGEN: 12 mg/dL (ref 7–21)
BUN / CREAT RATIO: 16
CALCIUM: 9.9 mg/dL (ref 8.5–10.2)
CHLORIDE: 106 mmol/L (ref 98–107)
CO2: 28 mmol/L (ref 22.0–30.0)
CREATININE: 0.75 mg/dL (ref 0.60–1.00)
EGFR MDRD AF AMER: >=60 mL/min/{1.73_m2} (ref >=60)
EGFR MDRD NON AF AMER: >=60 mL/min/{1.73_m2} (ref >=60)
POTASSIUM: 3.9 mmol/L (ref 3.5–5.0)
PROTEIN TOTAL: 7.1 g/dL (ref 6.5–8.3)
SODIUM: 143 mmol/L (ref 135–145)

## 2017-03-15 LAB — MEAN CORPUSCULAR VOLUME: Lab: 99.5

## 2017-03-15 LAB — POTASSIUM: Potassium:SCnc:Pt:Ser/Plas:Qn:: 3.9

## 2017-03-15 LAB — MAGNESIUM: Magnesium:MCnc:Pt:Ser/Plas:Qn:: 1.8

## 2017-03-15 NOTE — Unmapped (Addendum)
BMT Clinic Follow-up Note    Patient Name: Christine [name]  Medical Record Number:  [id number]  Encounter Date: 03/15/2017     BMT Attending MD:  Lanae Boast, MD.     HPI: Christine [name] is a 62 y.o. 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   12 months.Tac stop date 06/15/16.      Interval History:  Christine [name] is feeling well [name]. She is mildly congested but otherwise feels well. Denies fever, chills. Denies cough or SOB. Denies CP.    Her energy has been good.   She denies nausea, vomiting, or diarrhea. She has no new rashes. She denies bleeding, bruising, or infectious symptoms. She continues to have a good appetite and her weight is stable [name]. She is drinking well and her renal function is good [name].     PMH, PSH, and PSocH: Reviewed and there are no changes.     Allergies:  Dapsone; Penicillins; Rasburicase; Sulfasalazine; Vancomycin; Vancomycin analogues; Adhesive; Amoxicillin; Cefepime; Morphine; and Tapentadol   Medications: Reviewed and updated in EPIC.     Review of Systems:  A comprehensive ROS is negative except for as mentioned above in HPI.     Objective:  VS:BP 141/79  - Pulse 90  - Temp 36.8 ??C (98.2 ??F) (Oral)  - Resp 16  - Wt 62.9 kg (138 lb 9.6 oz)  - LMP  (LMP Unknown)  - SpO2 98%  - BMI 23.81 kg/m??     KPS: 80%, Normal activity with effort; some signs or symptoms of disease (ECOG equivalent 1)    Acute Graft versus Host Disease Assessment: 03/15/2017  Organ stage: 0  Skin stage 0, No Rash, none, 0 % BSA;   Upper GI stage 0, no nausea and no vomiting;   Lower GI stage 0, No Diarrhea;   Hepatic stage 0, Bilirubin normal - 2mg /dL    Overall grade (Current) None     Physical Exam:   General appearance: Well appearing female in no acute distress. Slightly pale.   Head: Alopecia, wears wig. Normal, non-tender sinuses.  Eyes: PERRL, EOMs intact, conjunctiva and sclera clear.   Throat slightly erythematous.   Neck: Supple, no palpable LAD  Lungs: CTAB. No wheezes, crackles, or rhonchi.   Heart: RRR. Normal s1/s2. No murmur, rub, or gallop.  Abdomen: Soft, non-tender, normoactive BS, no organomegaly.   Extremities: No edema. Pulses palpable in all extremities.   MSK: No joint deformity, tenderness, or swelling. FROM  Skin: Warm, dry, and intact. No rashes.   Line: R PAC accessed. Non-tender and no notable erythema.     Test Results:  Lab Results   Component Value Date    WBC 4.1 (L) 03/15/2017    HGB 13.0 03/15/2017    HCT 39.5 03/15/2017    PLT 234 03/15/2017       Lab Results   Component Value Date    NA 143 03/15/2017    K 3.9 03/15/2017    CL 106 03/15/2017    CO2 28.0 03/15/2017    BUN 12 03/15/2017    CREATININE 0.75 03/15/2017    GLU 73 03/15/2017    CALCIUM 9.9 03/15/2017    MG 1.8 03/15/2017    PHOS 3.0 04/15/2016       Lab Results   Component Value Date    BILITOT 0.5 03/15/2017    BILIDIR <0.10 04/15/2016    PROT  7.1 03/15/2017    ALBUMIN 4.5 03/15/2017    ALT 45 03/15/2017    AST 38 03/15/2017    ALKPHOS 73 03/15/2017    GGT 115 (H) 08/10/2016     ASSESSMENT / IMPRESSION:  **AML:??(FLT3-ITD, +11, and IDH2)??in CR prior to transplant.   06/22/16: BMBx:   - ??Normocellular bone marrow (40%) with trilineage hematopoiesis and 3% blasts.  - ??Routine cytogenetics: donor karyotype; FISH (trisomy 11) results are normal.  - ??Flow cytometric MRD results reveal no evidence of aberrant myeloid antigen expression or abnormal myeloblasts.  - ??DNA Chimerism see below.  - Considered maintenance with FLT3 inhibitor??(sorafenib). Was reluctant to start around D+45-60 due to pancytopenia and changing chimerism. Decided against it. Now she is 10 months after transplant. Discussed maintenance again - decided against it.   ??  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 ??  Conditioning:??  ????????????????????????Busulfan PK calculated from test dose 80 mg/hr daily and Fludarabine 40 mg/m2 daily ??day -5 thru day -2  ????????????????????????Day 0 -??02/24/16  Cell dose:??3.24 x10^8 TNC (Marrow)  Chimerisms/Marrow:  03/31/16: Day 30 marrow normocellular w/1% blasts. FISH and cytos are normal. MRD negative.   BM Chimerisms show 75% donor CD3 and >95% donor in unfractionated compartment.   04/22/16: PB Chimerisms show 71% donor CD3 and >95% donor unfractionated. 05/05/16: BM Chimerisms show 65% donor CD3 and >95% donor unfractionated.   05/18/16: PB Chimerisms show 74% donor CD3 and 95% donor unfractionated.   06/01/16: PB Chimerisms show 69% donor CD3 and >95% donor unfractionated.  06/22/16: PB Chimerisms show 80% donor CD3 and >95% donor unfractionated  06/22/16: BM Chimerisms show 79% donor CD3 and >95% donor unfractionated.   07/06/16: PB Chimerisms show 77% donor CD3 and >95% donor unfractionated.  07/13/16: Off tac x 4 weeks.??Chimerism check next visit.  If no full chimerism at 8-10 weeks after discontinuation of tac will consider DLI. We do not have any donor cells stored. If we need to recollect for DLI, will consider mobilizing the donor and collecting CD3+ cells for DLI as well as CD34+ cells for another transplant.   07/26/16: Off tac almost 6 weeks. PB Chimerisms show 74% donor in CD3 and >95% donor unfractionated.   08/10/16: Off tac 8 weeks. Repeat DNA shows slight increase in CD3 to 79%. Plan after discussion with Dr. Oswald Hillock to re-collect sister for more donor cells for DLI. However, [name] with??elevated LFTs, this is new, will need to follow these and make sure no GVHD prior to contacting sister for further donation. This was discussed with Christine [name].   08/18/16: Off tac 9 weeks.??PB Chimerisms show??85% donor CD3 and >95% donor unfractionated.  08/24/16: Off tac 10 weeks. BM Chimerisms show 79% donor CD3 and >95% donor unfractionated.  09/07/16: Off tac 12 weeks. PB Chimerisms show 91% donor CD3 and >95% donor unfractionated.??  09/28/16: PB chimerism 94% donor CD3 and >95% donor unfractionated.  10/26/16: PB chimerism 90% donor CD3 and >95% donor unfractionated.??  11/23/16: PB chimerism 95% donor CD3 and >95% donor unfractionated.??  12/22/16: PB chimerism 94% donor CD3 and >95% donor unfractionated.??  01/18/17: PB chimerism 93% donor CD3 and >95% donor unfractionated.??  02/15/17: PB chimerism 95% donor CD3 and >95% donor unfractionated.??  03/15/17: PB chimerism 95% donor CD3 and >95% donor unfractionated.??    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   - Methotrexate on day +1, 3, 6, 11 (received all 4 doses)  - Tac tapered and eventually DC'd 06/15/16????  - ??  Has had no e/o GVHD,developed new elevated LFTs noted at visit on 7/31. Liver US(08/18/16): 1.Patent hepatic vasculature with normal flow direction 2.Large simple hepatic cysts.3. Stone in gallbladder.   - 01/18/17: LFTs continue to improve.   - 03/15/17: LFT WNL.    ??  Heme:??CBC stable.   Transfusion criteria: * Pre-med with tylenol and Zyrtec pre- blood or platelet transfusion.   - Hgb <8 and Plt <10K or bleeding.   - Granix if ANC <0.5.   ??  Hx of DVT:  - Completed 6 mos or anticoagulation.     **ID:??  Prophylaxis:   - Continue Valtrex for Zoster prophy 500 mg qd.   - Bactrim stopped due to pancytopenia. Allergic to dapsone. Switched to Pentamidine (Dose #1 - 5/8). Continues on monthly Pentamidine given low CD4 count.   ??  Low CD4  - 06/30/16: Off IS. CD4 count 126.   - 09/07/16: CD4 count 137. ??  - 09/28/16: Received Pentamidine [name]. Plan to check CD4 level at next visit and continue with monthly pentamidine.   - 10/26/16: CD4 count 180. Continue monthly pentamidine.   - 11/23/16: CD4 count 167. Continue monthly pentamidine.   - 12/22/16: Continue monthly pentamidine ([name]).  - 01/19/16: Pentamidine due [name].    - 02/15/17: Repeat CD4 count 188. Continue pentamidine.   - 03/15/17: Received dose [name].   ????  Viral studies:  - 11/23/16: Viral panels have been negative. Will stop monitoring unless clinically indicated.    - 03/15/17: Respiratory virus panel positive for rhinovirus. She due due for next set of post-transplant vaccines and Shingrix. In light of possibly URTI will delay until next visit.      Vaccine:   - 09/07/16 Next set due in the end of February - next visit   Will also consider Shingrix - likely after her immunity is a bit better.    - 03/15/17: Delay vaccines due to cold symptoms. Will plan on the next dose of post-transplant vaccines and Shingrix next visit.   ??  **CV:   Hx of anthracycline induced cardiomyopathy:  - Nov 2017 w/EF 35%. This has now resolved with 01/2016 Echo showing estimated EF of 50%.   - Treated with lisinopril and metoprolol (Metoprolol 50mg  XL).   - Now off all antihypertensives due to intolerance (hypotension).   - 03/15/17: BP WNL. Asymptomatic.   ??  HTN:??Lisinopril stopped d/t AKI.   - 04/07/16: D/C'd Norvasc. Due to asymptomatic hypotension. Pre-transplant blood pressures run in the 100/70-80 range per her report.   Per Dr. Barbette Merino, ok to stop metoprolol given low BPs with plan to repeat Echo in 6-8 weeks.  - 07/06/16: Baseline ECHO [name] showed EF of 45-50%. DC metoprolol.   - 07/13/16: Follow BP. Will repeat ECHO 8-12 weeks after stopping metoprolol. Repeat ECHO scheduled for 10/26/2016.  - 10/26/16: Repeat ECHO showed normal EF. Ms. Degeorge saw Ramond Marrow who decided not to restart ACE inhibitor or beta blocker. Recommended annual follow up with ECHO of the heart.    ??  **GI/Hepatic:??  - Completed VOD prophylaxis.   - Elevated LFTs: Mild elevation at visit on 07/26/16. 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   --- 08/10/16 AST and ALT now 107/170 respectively. Normal amylase and lipase. No abdominal pain or GI symptoms.   --- 08/13/16 ????AST 191 and ALT 283. Alk phos 177.  --- 08/18/16 ??AST 144 and ALT 264. Alk phos 218. Liver US: with patent hepatic vasculature notable for large simple hepatic cyst and gallstones.  --- 09/07/16:  AST 136 and ALT 242. Alk phos 197. Stable.   --- 09/28/16: AST 91 and ALT 159. Alk phos 128. LFTs continue to improve.   --- 10/26/16: AST 84 and ALT 139. Alk phos 114. LFTs continue to improve.   --- 11/23/16:  AST 64 and ALT 102. Alk phos 96. LFTs continue to improve.   --- 12/22/16:  AST 51 and ALT 72. Alk phos 79. LFTs continue to improve.   --- 01/18/17: AST 42 and ALT 53. Alk phos 75. LFTs continue to improve.    --- 02/15/17: AST 47 and ALT 53. Alk phos 71. Stable.   --- 03/15/17: LFT WNL.   ??  **Bone Health:  - 03/31/16: DEXA scan showed osteoporosis of the spine and osteopenia of the femoral neck. Zometa and Ca/Vit D.   - 04/02/16: Received 1st dose of Zometa.   - 07/06/16. 2nd dose of Zometa. Completed. Continue Ca/Vit D supplement.   - 03/15/17: Patient received two doses of Zometa. Continue Ca/Vit D supplement. Vit D 38.9 WNL.   ??  Plan: Gertude is almost 12 mo out of her allo-SCT. She has always been a full donor chimera is unfractionated compartment but a mixed chimera in CD3 compartment. The CD3 chimerism now stabilized around 95%. Transaminitis coincided with improvement in chimerism - slowly improved and now resolved.    - CD4 counts still low, will continue pentamidine.  - Resp virus swab [name] positive for rhinovirus. Patient knows to call if symptoms do not resolved within a few days.  - Delay vaccines planned for [name] due to infection.  - RTC in 4 weeks.         Willaim Bane  03/15/2017  10:40 AM

## 2017-03-15 NOTE — Unmapped (Signed)
Discharged from infusion area ambulatory with no complications noted post pentamidine.

## 2017-03-16 LAB — VITAMIN D, TOTAL (25OH): Lab: 38.9

## 2017-03-18 ENCOUNTER — Other Ambulatory Visit (HOSPITAL_COMMUNITY)
Admission: RE | Admit: 2017-03-18 | Discharge: 2017-03-18 | Disposition: A | Discharge status: Home / Self Care | Payer: BLUE CROSS/BLUE SHIELD | Source: Ambulatory Visit | Attending: Family | Admitting: Family

## 2017-03-18 ENCOUNTER — Encounter: Payer: Self-pay | Admitting: Family

## 2017-03-18 ENCOUNTER — Ambulatory Visit (INDEPENDENT_AMBULATORY_CARE_PROVIDER_SITE_OTHER): Payer: BLUE CROSS/BLUE SHIELD | Admitting: Family

## 2017-03-18 VITALS — BP 118/80 | HR 92 | Temp 97.9°F | Resp 15 | Ht 64.0 in | Wt 137.5 lb

## 2017-03-18 DIAGNOSIS — Z0000 Encounter for general adult medical examination without abnormal findings: Secondary | ICD-10-CM | POA: Insufficient documentation

## 2017-03-18 DIAGNOSIS — C9201 Acute myeloblastic leukemia, in remission: Secondary | ICD-10-CM

## 2017-03-18 DIAGNOSIS — M810 Age-related osteoporosis without current pathological fracture: Secondary | ICD-10-CM

## 2017-03-18 NOTE — Assessment & Plan Note (Signed)
5months post transplant. Doing well. Continues monthly f/us with oncology

## 2017-03-18 NOTE — Patient Instructions (Addendum)
Labs when fasting  We placed a referral for mammogram this year. I asked that you call one the below locations and schedule this when it is convenient for you.   As discussed, I would like you to ask for 3D mammogram over the traditional 2D mammogram as new evidence suggest 3D is superior.   Please note that NOT all insurance companies cover 3D and you may have to pay a higher copay. You may call your insurance company to further clarify your benefits.   Options for [place]  [place], [place]  * [facility] [place], [place] * Note if you ask for 3D mammogram at this location, you must request [place], [place] location*    Health Maintenance for Postmenopausal Women Menopause is a normal process in which your reproductive ability comes to an end. This process happens gradually over a span of months to years, usually between the ages of 72 and 29. Menopause is complete when you have missed 12 consecutive menstrual periods. It is important to talk with your health care provider about some of the most common conditions that affect postmenopausal women, such as heart disease, cancer, and bone loss (osteoporosis). Adopting a healthy lifestyle and getting preventive care can help to promote your health and wellness. Those actions can also lower your chances of developing some of these common conditions. What should I know about menopause? During menopause, you may experience a number of symptoms, such as:  Moderate-to-severe hot flashes.  Night sweats.  Decrease in sex drive.  Mood swings.  Headaches.  Tiredness.  Irritability.  Memory problems.  Insomnia.  Choosing to treat or not to treat menopausal changes is an individual decision that you make with your health care provider. What should I know about hormone  replacement therapy and supplements? Hormone therapy products are effective for treating symptoms that are associated with menopause, such as hot flashes and night sweats. Hormone replacement carries certain risks, especially as you become older. If you are thinking about using estrogen or estrogen with progestin treatments, discuss the benefits and risks with your health care provider. What should I know about heart disease and stroke? Heart disease, heart attack, and stroke become more likely as you age. This may be due, in part, to the hormonal changes that your body experiences during menopause. These can affect how your body processes dietary fats, triglycerides, and cholesterol. Heart attack and stroke are both medical emergencies. There are many things that you can do to help prevent heart disease and stroke:  Have your blood pressure checked at least every 1-2 years. High blood pressure causes heart disease and increases the risk of stroke.  If you are 21-30 years old, ask your health care provider if you should take aspirin to prevent a heart attack or a stroke.  Do not use any tobacco products, including cigarettes, chewing tobacco, or electronic cigarettes. If you need help quitting, ask your health care provider.  It is important to eat a healthy diet and maintain a healthy weight. ? Be sure to include plenty of vegetables, fruits, low-fat dairy products, and lean protein. ? Avoid eating foods that are high in solid fats, added sugars, or salt (sodium).  Get regular exercise. This is one of the most important things that you can do for your health. ? Try to exercise for at least  150 minutes each week. The type of exercise that you do should increase your heart rate and make you sweat. This is known as moderate-intensity exercise. ? Try to do strengthening exercises at least twice each week. Do these in addition to the moderate-intensity exercise.  Know your numbers.Ask your health  care provider to check your cholesterol and your blood glucose. Continue to have your blood tested as directed by your health care provider.  What should I know about cancer screening? There are several types of cancer. Take the following steps to reduce your risk and to catch any cancer development as early as possible. Breast Cancer  Practice breast self-awareness. ? This means understanding how your breasts normally appear and feel. ? It also means doing regular breast self-exams. Let your health care provider know about any changes, no matter how small.  If you are 43 or older, have a clinician do a breast exam (clinical breast exam or CBE) every year. Depending on your age, family history, and medical history, it may be recommended that you also have a yearly breast X-ray (mammogram).  If you have a family history of breast cancer, talk with your health care provider about genetic screening.  If you are at high risk for breast cancer, talk with your health care provider about having an MRI and a mammogram every year.  Breast cancer (BRCA) gene test is recommended for women who have family members with BRCA-related cancers. Results of the assessment will determine the need for genetic counseling and BRCA1 and for BRCA2 testing. BRCA-related cancers include these types: ? Breast. This occurs in males or females. ? Ovarian. ? Tubal. This may also be called fallopian tube cancer. ? Cancer of the abdominal or pelvic lining (peritoneal cancer). ? Prostate. ? Pancreatic.  Cervical, Uterine, and Ovarian Cancer Your health care provider may recommend that you be screened regularly for cancer of the pelvic organs. These include your ovaries, uterus, and vagina. This screening involves a pelvic exam, which includes checking for microscopic changes to the surface of your cervix (Pap test).  For women ages 21-65, health care providers may recommend a pelvic exam and a Pap test every three years.  For women ages 61-65, they may recommend the Pap test and pelvic exam, combined with testing for human papilloma virus (HPV), every five years. Some types of HPV increase your risk of cervical cancer. Testing for HPV may also be done on women of any age who have unclear Pap test results.  Other health care providers may not recommend any screening for nonpregnant women who are considered low risk for pelvic cancer and have no symptoms. Ask your health care provider if a screening pelvic exam is right for you.  If you have had past treatment for cervical cancer or a condition that could lead to cancer, you need Pap tests and screening for cancer for at least 20 years after your treatment. If Pap tests have been discontinued for you, your risk factors (such as having a new sexual partner) need to be reassessed to determine if you should start having screenings again. Some women have medical problems that increase the chance of getting cervical cancer. In these cases, your health care provider may recommend that you have screening and Pap tests more often.  If you have a family history of uterine cancer or ovarian cancer, talk with your health care provider about genetic screening.  If you have vaginal bleeding after reaching menopause, tell your health care provider.  There are currently no reliable tests available to screen for ovarian cancer.  Lung Cancer Lung cancer screening is recommended for adults 44-31 years old who are at high risk for lung cancer because of a history of smoking. A yearly low-dose CT scan of the lungs is recommended if you:  Currently smoke.  Have a history of at least 30 pack-years of smoking and you currently smoke or have quit within the past 15 years. A pack-year is smoking an average of one pack of cigarettes per day for one year.  Yearly screening should:  Continue until it has been 15 years since you quit.  Stop if you develop a health problem that would prevent  you from having lung cancer treatment.  Colorectal Cancer  This type of cancer can be detected and can often be prevented.  Routine colorectal cancer screening usually begins at age 27 and continues through age 92.  If you have risk factors for colon cancer, your health care provider may recommend that you be screened at an earlier age.  If you have a family history of colorectal cancer, talk with your health care provider about genetic screening.  Your health care provider may also recommend using home test kits to check for hidden blood in your stool.  A small camera at the end of a tube can be used to examine your colon directly (sigmoidoscopy or colonoscopy). This is done to check for the earliest forms of colorectal cancer.  Direct examination of the colon should be repeated every 5-10 years until age 66. However, if early forms of precancerous polyps or small growths are found or if you have a family history or genetic risk for colorectal cancer, you may need to be screened more often.  Skin Cancer  Check your skin from head to toe regularly.  Monitor any moles. Be sure to tell your health care provider: ? About any new moles or changes in moles, especially if there is a change in a mole's shape or color. ? If you have a mole that is larger than the size of a pencil eraser.  If any of your family members has a history of skin cancer, especially at a young age, talk with your health care provider about genetic screening.  Always use sunscreen. Apply sunscreen liberally and repeatedly throughout the day.  Whenever you are outside, protect yourself by wearing long sleeves, pants, a wide-brimmed hat, and sunglasses.  What should I know about osteoporosis? Osteoporosis is a condition in which bone destruction happens more quickly than new bone creation. After menopause, you may be at an increased risk for osteoporosis. To help prevent osteoporosis or the bone fractures that can  happen because of osteoporosis, the following is recommended:  If you are 32-73 years old, get at least 1,000 mg of calcium and at least 600 mg of vitamin D per day.  If you are older than age 57 but younger than age 65, get at least 1,200 mg of calcium and at least 600 mg of vitamin D per day.  If you are older than age 59, get at least 1,200 mg of calcium and at least 800 mg of vitamin D per day.  Smoking and excessive alcohol intake increase the risk of osteoporosis. Eat foods that are rich in calcium and vitamin D, and do weight-bearing exercises several times each week as directed by your health care provider. What should I know about how menopause affects my mental health? Depression may occur at any  age, but it is more common as you become older. Common symptoms of depression include:  Low or sad mood.  Changes in sleep patterns.  Changes in appetite or eating patterns.  Feeling an overall lack of motivation or enjoyment of activities that you previously enjoyed.  Frequent crying spells.  Talk with your health care provider if you think that you are experiencing depression. What should I know about immunizations? It is important that you get and maintain your immunizations. These include:  Tetanus, diphtheria, and pertussis (Tdap) booster vaccine.  Influenza every year before the flu season begins.  Pneumonia vaccine.  Shingles vaccine.  Your health care provider may also recommend other immunizations. This information is not intended to replace advice given to you by your health care provider. Make sure you discuss any questions you have with your health care provider. Document Released: 02/19/2005 Document Revised: 07/18/2015 Document Reviewed: 10/01/2014 Elsevier Interactive Patient Education  2018 [employer].

## 2017-03-18 NOTE — Progress Notes (Signed)
Subjective:    Patient ID: Robyn Montgomery, female    DOB: 1955/07/24, 62 y.o.   MRN: [id number]  CC: Robyn Montgomery is a 62 y.o. female who presents today for physical exam.    HPI: Doing well today No complaints    H/o history of leukemia - remission 2017, s/p stem cell transplant. Continues to follow with oncology.    Colorectal Cancer Screening: UTD , done 2015. Due 2025 Breast Cancer Screening: Mammogram UTD 2018. Cervical Cancer Screening: due; atropy noted on 2015; due [place] screening/DEXA for 65+: osteoporosis , last 2015. Due Lung Cancer Screening: Doesn't have 30 year pack year history and age > 18 years       Tetanus - due   Had pneumococcal 08/2016 Hepatitis C screening - Candidate for, consents HIV Screening- Candidate for, consents Labs: Screening labs today. Exercise: Gets regular exercise.   Alcohol use: rare Smoking/tobacco use: Nonsmoker.  Regular dental exams: utd Wears seat belt: Yes. Skin: follows with dermatology  HISTORY:  Past Medical History:  Diagnosis Date  . Cancer ([place])    leukemia- post stem cell transplant  . Osteoporosis   . Personal history of chemotherapy     Past Surgical History:  Procedure Laterality Date  . BREAST BIOPSY Left    mole removed above left nipple, no bx  . broken shoulder Right 2012   Plates and screws put in  . MOLE REMOVAL  1977   Removed from left breast  . SHOULDER SURGERY     Family History  Problem Relation Age of Onset  . Arthritis Mother   . Hypertension Mother   . Cancer Mother 2       Breast  . Osteoporosis Mother   . Breast cancer Mother 31  . Parkinsonism Father   . Heart disease Maternal Uncle   . Cancer Paternal Grandfather        Oral      ALLERGIES: Dapsone; Rasburicase; Penicillins; Sulfa antibiotics; Vancomycin; Cefepime; Morphine; and Tape  Current Outpatient Medications on File Prior to Visit  Medication Sig Dispense Refill  . Calcium Carb-Cholecalciferol 600-800  MG-UNIT CHEW Chew by mouth.    . valACYclovir (VALTREX) 500 MG tablet TAKE 1 TABLET BY MOUTH ONCE DAILY    . enoxaparin (LOVENOX) 60 MG/0.6ML injection Inject 60 mg into the skin.    . hydrOXYzine (ATARAX/VISTARIL) 25 MG tablet   0  . lisinopril (PRINIVIL,ZESTRIL) 5 MG tablet   2  . metoprolol succinate (TOPROL-XL) 50 MG 24 hr tablet Take 100 mg by mouth.    . ondansetron (ZOFRAN-ODT) 4 MG disintegrating tablet Take 4 mg by mouth.    . prednisoLONE acetate (PRED FORTE) 1 % ophthalmic suspension   0   No current facility-administered medications on file prior to visit.     Social History   Tobacco Use  . Smoking status: Never Smoker  . Smokeless tobacco: Never Used  Substance Use Topics  . Alcohol use: Yes    Alcohol/week: 0.0 oz    Comment: Rare  . Drug use: No    Review of Systems  Constitutional: Negative for chills, fever and unexpected weight change.  HENT: Negative for congestion.   Respiratory: Negative for cough.   Cardiovascular: Negative for chest pain, palpitations and leg swelling.  Gastrointestinal: Negative for nausea and vomiting.  Musculoskeletal: Negative for arthralgias and myalgias.  Skin: Negative for rash.  Neurological: Negative for headaches.  Hematological: Negative for adenopathy.  Psychiatric/Behavioral: Negative for confusion.  Objective:    BP 118/80 (BP Location: Left Arm, Patient Position: Sitting, Cuff Size: Normal)   Pulse 92   Temp 97.9 F (36.6 C) (Oral)   Resp 15   Ht 5\' 4"  (1.626 m)   Wt 137 lb 8 oz (62.4 kg)   SpO2 98%   BMI 23.60 kg/m   BP Readings from Last 3 Encounters:  03/18/17 118/80  02/02/16 124/86  08/27/15 124/73   Wt Readings from Last 3 Encounters:  03/18/17 137 lb 8 oz (62.4 kg)  02/02/16 140 lb 6.4 oz (63.7 kg)  08/26/15 147 lb 11.2 oz (67 kg)    Physical Exam  Constitutional: She appears well-developed and well-nourished.  Eyes: Conjunctivae are normal.  Neck: No thyroid mass and no thyromegaly  present.  Cardiovascular: Normal rate, regular rhythm, normal heart sounds and normal pulses.  Pulmonary/Chest: Effort normal and breath sounds normal. She has no wheezes. She has no rhonchi. She has no rales. Right breast exhibits no inverted nipple, no mass, no nipple discharge, no skin change and no tenderness. Left breast exhibits no inverted nipple, no mass, no nipple discharge, no skin change and no tenderness. Breasts are symmetrical.  No masses or asymmetry appreciated during CBE.  Genitourinary: Uterus is not enlarged, not fixed and not tender. Cervix exhibits no motion tenderness, no discharge and no friability. Right adnexum displays no mass, no tenderness and no fullness. Left adnexum displays no mass, no tenderness and no fullness.  Genitourinary Comments: Pap performed. No CMT. Unable to appreciated ovaries.  Lymphadenopathy:       Head (right side): No submental, no submandibular, no tonsillar, no preauricular, no posterior auricular and no occipital adenopathy present.       Head (left side): No submental, no submandibular, no tonsillar, no preauricular, no posterior auricular and no occipital adenopathy present.       Right cervical: No superficial cervical, no deep cervical and no posterior cervical adenopathy present.      Left cervical: No superficial cervical, no deep cervical and no posterior cervical adenopathy present.    She has no axillary adenopathy.       Right axillary: No pectoral and no lateral adenopathy present.       Left axillary: No pectoral and no lateral adenopathy present. Neurological: She is alert.  Skin: Skin is warm and dry.  Psychiatric: She has a normal mood and affect. Her speech is normal and behavior is normal. Thought content normal.  Vitals reviewed.      Assessment & Plan:   Problem List Items Addressed This Visit      Musculoskeletal and Integument   Osteoporosis    Advised repeat dexa      Relevant Medications   Calcium  Carb-Cholecalciferol 600-800 MG-UNIT CHEW   Other Relevant Orders   DG Bone Density     Other   Routine general medical examination at a health care facility - Primary    CBE and pap performed. DEXA ordered and I have messaged patient. She will also schedule mammogram.       Relevant Orders   Lipid panel   TSH   Hepatitis C antibody   HIV antibody   MM SCREENING BREAST TOMO BILATERAL   Cytology - PAP   Acute myeloid leukemia ([place])    31months post transplant. Doing well. Continues monthly f/us with oncology      Relevant Medications   valACYclovir (VALTREX) 500 MG tablet       I am having Ivan Anchors.  Torian "Pam" maintain her enoxaparin, metoprolol succinate, ondansetron, hydrOXYzine, lisinopril, prednisoLONE acetate, valACYclovir, and Calcium Carb-Cholecalciferol.   No orders of the defined types were placed in this encounter.   Return precautions given.   Risks, benefits, and alternatives of the medications and treatment plan prescribed today were discussed, and patient expressed understanding.   Education regarding symptom management and diagnosis given to patient on AVS.   Continue to follow with Burnard Hawthorne, FNP for routine health maintenance.   Combs and I agreed with plan.   Mable Paris, FNP

## 2017-03-18 NOTE — Assessment & Plan Note (Signed)
Advised repeat dexa

## 2017-03-18 NOTE — Assessment & Plan Note (Addendum)
CBE and pap performed. DEXA ordered and I have messaged patient. She will also schedule mammogram.

## 2017-03-21 LAB — CYTOLOGY - PAP
Diagnosis: NEGATIVE
HPV (WINDOPATH): NOT DETECTED

## 2017-03-29 ENCOUNTER — Other Ambulatory Visit (INDEPENDENT_AMBULATORY_CARE_PROVIDER_SITE_OTHER): Payer: BLUE CROSS/BLUE SHIELD

## 2017-03-29 DIAGNOSIS — Z0000 Encounter for general adult medical examination without abnormal findings: Secondary | ICD-10-CM

## 2017-03-29 NOTE — Addendum Note (Signed)
Addended by: Arby Barrette on: 03/29/2017 10:33 AM   Modules accepted: Orders

## 2017-03-30 LAB — HIV ANTIBODY (ROUTINE TESTING W REFLEX): HIV 1&2 Ab, 4th Generation: NONREACTIVE

## 2017-03-30 LAB — HEPATITIS C ANTIBODY
Hepatitis C Ab: NONREACTIVE
SIGNAL TO CUT-OFF: 0.01 (ref <1.00)

## 2017-03-30 LAB — TSH: TSH: 0.71 mIU/L (ref 0.40–4.50)

## 2017-03-30 LAB — LIPID PANEL
Cholesterol: 239 mg/dL — ABNORMAL HIGH (ref <200)
HDL: 69 mg/dL (ref >50)
LDL CHOLESTEROL (CALC): 147 mg/dL — AB
NON-HDL CHOLESTEROL (CALC): 170 mg/dL — AB (ref <130)
TRIGLYCERIDES: 114 mg/dL (ref <150)
Total CHOL/HDL Ratio: 3.5 (calc) (ref <5.0)

## 2017-04-04 ENCOUNTER — Encounter: Payer: Self-pay | Admitting: Family

## 2017-04-08 MED FILL — VALACYCLOVIR/500MG/TAB: VALACYCLOVIR/500MG/TAB | 30 days supply | Qty: 30 | Fill #2

## 2017-04-12 ENCOUNTER — Other Ambulatory Visit: Admit: 2017-04-12 | Discharge: 2017-04-12 | Payer: PRIVATE HEALTH INSURANCE

## 2017-04-12 ENCOUNTER — Ambulatory Visit: Admit: 2017-04-12 | Discharge: 2017-04-12 | Payer: PRIVATE HEALTH INSURANCE

## 2017-04-12 DIAGNOSIS — Z9484 Stem cells transplant status: Principal | ICD-10-CM

## 2017-04-12 DIAGNOSIS — D899 Disorder involving the immune mechanism, unspecified: Principal | ICD-10-CM

## 2017-04-12 DIAGNOSIS — C9201 Acute myeloblastic leukemia, in remission: Secondary | ICD-10-CM

## 2017-04-12 DIAGNOSIS — Z9481 Bone marrow transplant status: Secondary | ICD-10-CM

## 2017-04-12 LAB — COMPREHENSIVE METABOLIC PANEL
ALBUMIN: 4.2 g/dL (ref 3.5–5.0)
ALT (SGPT): 37 U/L (ref 15–48)
ANION GAP: 7 mmol/L — ABNORMAL LOW (ref 9–15)
AST (SGOT): 35 U/L (ref 14–38)
BILIRUBIN TOTAL: 0.5 mg/dL (ref 0.0–1.2)
BLOOD UREA NITROGEN: 12 mg/dL (ref 7–21)
BUN / CREAT RATIO: 18
CALCIUM: 9.7 mg/dL (ref 8.5–10.2)
CHLORIDE: 106 mmol/L (ref 98–107)
CO2: 29 mmol/L (ref 22.0–30.0)
CREATININE: 0.66 mg/dL (ref 0.60–1.00)
EGFR MDRD AF AMER: >=60 mL/min/{1.73_m2} (ref >=60)
EGFR MDRD NON AF AMER: >=60 mL/min/{1.73_m2} (ref >=60)
GLUCOSE RANDOM: 77 mg/dL (ref 65–179)
POTASSIUM: 4.8 mmol/L (ref 3.5–5.0)
PROTEIN TOTAL: 6.9 g/dL (ref 6.5–8.3)
SODIUM: 142 mmol/L (ref 135–145)

## 2017-04-12 LAB — CBC W/ AUTO DIFF
BASOPHILS ABSOLUTE COUNT: 0 10*9/L (ref 0.0–0.1)
BASOPHILS RELATIVE PERCENT: 0.5 %
EOSINOPHILS ABSOLUTE COUNT: 0.1 10*9/L (ref 0.0–0.4)
EOSINOPHILS RELATIVE PERCENT: 3.5 %
HEMATOCRIT: 38.7 % (ref 36.0–46.0)
HEMOGLOBIN: 12.9 g/dL (ref 12.0–16.0)
LARGE UNSTAINED CELLS: 4 % (ref 0–4)
LYMPHOCYTES ABSOLUTE COUNT: 0.8 10*9/L — ABNORMAL LOW (ref 1.5–5.0)
LYMPHOCYTES RELATIVE PERCENT: 27.1 %
MEAN CORPUSCULAR HEMOGLOBIN: 33.5 pg (ref 26.0–34.0)
MONOCYTES ABSOLUTE COUNT: 0.2 10*9/L (ref 0.2–0.8)
MONOCYTES RELATIVE PERCENT: 8.6 %
NEUTROPHILS ABSOLUTE COUNT: 1.6 10*9/L — ABNORMAL LOW (ref 2.0–7.5)
NEUTROPHILS RELATIVE PERCENT: 56.4 %
PLATELET COUNT: 240 10*9/L (ref 150–440)
RED BLOOD CELL COUNT: 3.86 10*12/L — ABNORMAL LOW (ref 4.00–5.20)
RED CELL DISTRIBUTION WIDTH: 13 % (ref 12.0–15.0)
WBC ADJUSTED: 2.8 10*9/L — ABNORMAL LOW (ref 4.5–11.0)

## 2017-04-12 LAB — WBC ADJUSTED: Lab: 2.8 — ABNORMAL LOW

## 2017-04-12 LAB — POTASSIUM: Potassium:SCnc:Pt:Ser/Plas:Qn:: 4.8

## 2017-04-12 NOTE — Unmapped (Signed)
Discharged from infusion area ambulatory with no complications noted post pentamidine.

## 2017-04-12 NOTE — Unmapped (Signed)
BMT Clinic Follow-up Note    Patient Name: Christine [name]  Medical Record Number:  [id number]  Encounter Date: 04/12/2017     BMT Attending MD:  Lanae Boast, MD.     HPI: Christine [name] is a 62 y.o. 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 13 months post transplant.  Tacrolimus stop date 06/15/16.      Interval History:  Christine [name] is feeling well today. She denies fever, chills. Denies cough or SOB. Denies CP.    Her energy has been good.   She denies nausea, vomiting, or diarrhea. She has no new rashes. She denies bleeding, bruising, or infectious symptoms. She continues to have a good appetite and her weight is stable today. She is drinking well and her renal function is good today.     PMH, PSH, and PSocH: Reviewed and there are no changes.     Allergies:  Dapsone; Penicillins; Rasburicase; Sulfasalazine; Vancomycin; Vancomycin analogues; Adhesive; Amoxicillin; Cefepime; Morphine; and Tapentadol   Medications: Reviewed and updated in EPIC.     Review of Systems:  A comprehensive ROS is negative except for as mentioned above in HPI.     Objective:  VS:BP 115/76  - Pulse 73  - Temp 36.2 ??C (97.2 ??F) (Oral)  - Resp 16  - Wt 63.2 kg (139 lb 4.8 oz)  - LMP  (LMP Unknown)  - SpO2 100%  - BMI 23.93 kg/m??     KPS: 90%, Normal activity with effort; some signs or symptoms of disease (ECOG equivalent 1)    Acute Graft versus Host Disease Assessment: 04/12/2017  Organ stage: 0  Skin stage 0, No Rash, none, 0 % BSA;   Upper GI stage 0, no nausea and no vomiting;   Lower GI stage 0, No Diarrhea;   Hepatic stage 0, Bilirubin normal - 2mg /dL    Overall grade (Current) None     Physical Exam:   General appearance: Well appearing female in no acute distress. Slightly pale.   Head: Alopecia, wears wig. Normal, non-tender sinuses.  Eyes: PERRL, EOMs intact, conjunctiva and sclera clear.   Throat slightly erythematous. Neck: Supple, no palpable LAD  Lungs: CTAB. No wheezes, crackles, or rhonchi.   Heart: RRR. Normal s1/s2. No murmur, rub, or gallop.  Abdomen: Soft, non-tender, normoactive BS, no organomegaly.   Extremities: No edema. Pulses palpable in all extremities.   MSK: No joint deformity, tenderness, or swelling. FROM  Skin: Warm, dry, and intact. No rashes.   Line: R PAC accessed. Non-tender and no notable erythema.     Test Results:  Lab Results   Component Value Date    WBC 2.8 (L) 04/12/2017    HGB 12.9 04/12/2017    HCT 38.7 04/12/2017    PLT 240 04/12/2017       Lab Results   Component Value Date    NA 142 04/12/2017    K 4.8 04/12/2017    CL 106 04/12/2017    CO2 29.0 04/12/2017    BUN 12 04/12/2017    CREATININE 0.66 04/12/2017    GLU 77 04/12/2017    CALCIUM 9.7 04/12/2017    MG 1.8 03/15/2017    PHOS 3.0 04/15/2016       Lab Results   Component Value Date    BILITOT 0.5 04/12/2017    BILIDIR <0.10 04/15/2016    PROT 6.9 04/12/2017    ALBUMIN 4.2  04/12/2017    ALT 37 04/12/2017    AST 35 04/12/2017    ALKPHOS 82 04/12/2017    GGT 115 (H) 08/10/2016     ASSESSMENT / IMPRESSION:  **AML:??(FLT3-ITD, +11, and IDH2)??in CR prior to transplant.   06/22/16: BMBx:   - ??Normocellular bone marrow (40%) with trilineage hematopoiesis and 3% blasts.  - ??Routine cytogenetics: donor karyotype; FISH (trisomy 11) results are normal.  - ??Flow cytometric MRD results reveal no evidence of aberrant myeloid antigen expression or abnormal myeloblasts.  - ??DNA Chimerism see below.  - Considered maintenance with FLT3 inhibitor??(sorafenib). Was reluctant to start around D+45-60 due to pancytopenia and changing chimerism. Decided against it. Now she is 10 months after transplant. Discussed maintenance again - decided against it.   ??  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 ??  Conditioning:??  ????????????????????????Busulfan PK calculated from test dose 80 mg/hr daily and Fludarabine 40 mg/m2 daily ??day -5 thru day -2  ????????????????????????Day 0 -??02/24/16  Cell dose:??3.24 x10^8 TNC (Marrow)  Chimerisms/Marrow:  03/31/16: Day 30 marrow normocellular w/1% blasts. FISH and cytos are normal. MRD negative.   BM Chimerisms show 75% donor CD3 and >95% donor in unfractionated compartment.   04/22/16: PB Chimerisms show 71% donor CD3 and >95% donor unfractionated. 05/05/16: BM Chimerisms show 65% donor CD3 and >95% donor unfractionated.   05/18/16: PB Chimerisms show 74% donor CD3 and 95% donor unfractionated.   06/01/16: PB Chimerisms show 69% donor CD3 and >95% donor unfractionated.  06/22/16: PB Chimerisms show 80% donor CD3 and >95% donor unfractionated  06/22/16: BM Chimerisms show 79% donor CD3 and >95% donor unfractionated.   07/06/16: PB Chimerisms show 77% donor CD3 and >95% donor unfractionated.  07/13/16: Off tac x 4 weeks.??Chimerism check next visit.  If no full chimerism at 8-10 weeks after discontinuation of tac will consider DLI. We do not have any donor cells stored. If we need to recollect for DLI, will consider mobilizing the donor and collecting CD3+ cells for DLI as well as CD34+ cells for another transplant.   07/26/16: Off tac almost 6 weeks. PB Chimerisms show 74% donor in CD3 and >95% donor unfractionated.   08/10/16: Off tac 8 weeks. Repeat DNA shows slight increase in CD3 to 79%. Plan after discussion with Dr. Oswald Hillock to re-collect sister for more donor cells for DLI. However, today with??elevated LFTs, this is new, will need to follow these and make sure no GVHD prior to contacting sister for further donation. This was discussed with Christine [name] today.   08/18/16: Off tac 9 weeks.??PB Chimerisms show??85% donor CD3 and >95% donor unfractionated.  08/24/16: Off tac 10 weeks. BM Chimerisms show 79% donor CD3 and >95% donor unfractionated.  09/07/16: Off tac 12 weeks. PB Chimerisms show 91% donor CD3 and >95% donor unfractionated.??  09/28/16: PB chimerism 94% donor CD3 and >95% donor unfractionated.  10/26/16: PB chimerism 90% donor CD3 and >95% donor unfractionated.??  11/23/16: PB chimerism 95% donor CD3 and >95% donor unfractionated.??  12/22/16: PB chimerism 94% donor CD3 and >95% donor unfractionated.??  01/18/17: PB chimerism 93% donor CD3 and >95% donor unfractionated.??  02/15/17: PB chimerism 95% donor CD3 and >95% donor unfractionated.??  03/15/17: PB chimerism 95% donor CD3 and >95% donor unfractionated.??  04/12/17: PB chimerism 95% donor CD3 and >95% donor unfractionated.??    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   - Methotrexate on day +1, 3, 6, 11 (received all 4 doses)  - Tac tapered  and eventually DC'd 06/15/16????  - ??Has had no e/o GVHD,developed new elevated LFTs noted at visit on 7/31. Liver US(08/18/16): 1.Patent hepatic vasculature with normal flow direction 2.Large simple hepatic cysts.3. Stone in gallbladder.   - 01/18/17: LFTs continue to improve.   - 04/12/17: LFT WNL.    ??  Heme:??CBC stable.   Transfusion criteria: * Pre-med with tylenol and Zyrtec pre- blood or platelet transfusion.   - Hgb <8 and Plt <10K or bleeding.   - Granix if ANC <0.5.   ??  Hx of DVT:  - Completed 6 mos or anticoagulation.     **ID:??  Prophylaxis:   - Continue Valtrex for Zoster prophy 500 mg qd.   - Bactrim stopped due to pancytopenia. Allergic to dapsone. Switched to Pentamidine (Dose #1 - 5/8). Continues on monthly Pentamidine given low CD4 count.   Will check lymphocyte counts next visit.   ??  Low CD4  - 06/30/16: Off IS. CD4 count 126.   - 09/07/16: CD4 count 137. ??  - 09/28/16: Received Pentamidine today. Plan to check CD4 level at next visit and continue with monthly pentamidine.   - 10/26/16: CD4 count 180. Continue monthly pentamidine.   - 11/23/16: CD4 count 167. Continue monthly pentamidine.   - 12/22/16: Continue monthly pentamidine (today).  - 01/19/16: Pentamidine due today.    - 02/15/17: Repeat CD4 count 188. Continue pentamidine.   - 03/15/17: Received dose today.   - 04/12/17: Received dose today.   ????  Viral studies:  - 11/23/16: Viral panels have been negative. Will stop monitoring unless clinically indicated.    - 03/15/17: Respiratory virus panel positive for rhinovirus. She due due for next set of post-transplant vaccines and Shingrix. In light of possibly URTI will delay until next visit.      Vaccine:   - 09/07/16: First set of post-transplant vaccines. Next set due in the end of February - next visit   Will also consider Shingrix - likely after her immunity is a bit better.    - 03/15/17: Delay vaccines due to cold symptoms. Will plan on the next dose of post-transplant vaccines and Shingrix next visit.   - 04/12/17: Today 12 months post transplant vaccines. First dose of Shingrix vaccine. Next set in 10/2017.   ??  **CV:   Hx of anthracycline induced cardiomyopathy:  - Nov 2017 w/EF 35%. This has now resolved with 01/2016 Echo showing estimated EF of 50%.   - Treated with lisinopril and metoprolol (Metoprolol 50mg  XL).   - Now off all antihypertensives due to intolerance (hypotension).   - 03/15/17: BP WNL. Asymptomatic.   - 04/12/17: Patient reported that her PCP checked her cholesterol and it was borderline high 239. She is wondering if she take a lipid lowering agents recommended by her PCP. I advised her to discuss this with Dr. Barbette Merino whom she is scheduled to see in a few months.   ??  HTN:??Lisinopril stopped d/t AKI.   - 04/07/16: D/C'd Norvasc. Due to asymptomatic hypotension. Pre-transplant blood pressures run in the 100/70-80 range per her report.   Per Dr. Barbette Merino, ok to stop metoprolol given low BPs with plan to repeat Echo in 6-8 weeks.  - 07/06/16: Baseline ECHO today showed EF of 45-50%. DC metoprolol.   - 07/13/16: Follow BP. Will repeat ECHO 8-12 weeks after stopping metoprolol. Repeat ECHO scheduled for 10/26/2016.  - 10/26/16: Repeat ECHO showed normal EF. Ms. Sweezy saw Ramond Marrow who decided not to restart ACE inhibitor  or beta blocker. Recommended annual follow up with ECHO of the heart.    ??  **GI/Hepatic:??  - Completed VOD prophylaxis.   - Elevated LFTs: Mild elevation at visit on 07/26/16. 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   --- 08/10/16 AST and ALT now 107/170 respectively. Normal amylase and lipase. No abdominal pain or GI symptoms.   --- 08/13/16 ????AST 191 and ALT 283. Alk phos 177.  --- 08/18/16 ??AST 144 and ALT 264. Alk phos 218. Liver US: with patent hepatic vasculature notable for large simple hepatic cyst and gallstones.  --- 09/07/16: AST 136 and ALT 242. Alk phos 197. Stable.   --- 09/28/16: AST 91 and ALT 159. Alk phos 128. LFTs continue to improve.   --- 10/26/16: AST 84 and ALT 139. Alk phos 114. LFTs continue to improve.   --- 11/23/16:  AST 64 and ALT 102. Alk phos 96. LFTs continue to improve.   --- 12/22/16:  AST 51 and ALT 72. Alk phos 79. LFTs continue to improve.   --- 01/18/17: AST 42 and ALT 53. Alk phos 75. LFTs continue to improve.    --- 02/15/17: AST 47 and ALT 53. Alk phos 71. Stable.   --- 04/12/17: LFT WNL.   ??  **Bone Health:  - 03/31/16: DEXA scan showed osteoporosis of the spine and osteopenia of the femoral neck. Zometa and Ca/Vit D.   - 04/02/16: Received 1st dose of Zometa.   - 07/06/16. 2nd dose of Zometa. Completed. Continue Ca/Vit D supplement.   - 03/15/17: Patient received two doses of Zometa. Continue Ca/Vit D supplement. Vit D 38.9 WNL.   ??  Plan: Luda is 13 mo out of her allo-SCT. She has always been a full donor chimera is unfractionated compartment but a mixed chimera in CD3 compartment. The CD3 chimerism now stabilized around 95%. Transaminitis coincided with improvement in chimerism, then slowly improved and eventually resolved.    - CD4 counts still low, will continue pentamidine. Will recheck next visit.   - Next set of vaccines 18 mo + second dose of Shingrix in 10/2017.   - RTC in 4 weeks.       Willaim Bane  04/12/2017  10:11 AM

## 2017-04-12 NOTE — Unmapped (Signed)
Immunizations administered per orders. Patient provided with information regarding side effects and what symptoms to expect after vaccines. Patient verbalized understanding, time spent 5 minutes.

## 2017-05-05 ENCOUNTER — Ambulatory Visit: Payer: BLUE CROSS/BLUE SHIELD

## 2017-05-12 ENCOUNTER — Ambulatory Visit
Admission: RE | Admit: 2017-05-12 | Discharge: 2017-05-12 | Disposition: A | Discharge status: Home / Self Care | Payer: BLUE CROSS/BLUE SHIELD | Source: Ambulatory Visit | Attending: Family | Admitting: Family

## 2017-05-12 DIAGNOSIS — Z0000 Encounter for general adult medical examination without abnormal findings: Secondary | ICD-10-CM

## 2017-05-12 DIAGNOSIS — Z1231 Encounter for screening mammogram for malignant neoplasm of breast: Secondary | ICD-10-CM | POA: Diagnosis not present

## 2017-05-13 MED FILL — VALACYCLOVIR/500MG/TAB: VALACYCLOVIR/500MG/TAB | 30 days supply | Qty: 30 | Fill #3

## 2017-05-17 ENCOUNTER — Ambulatory Visit: Admit: 2017-05-17 | Discharge: 2017-05-17 | Payer: PRIVATE HEALTH INSURANCE

## 2017-05-17 ENCOUNTER — Other Ambulatory Visit: Admit: 2017-05-17 | Discharge: 2017-05-17 | Payer: PRIVATE HEALTH INSURANCE

## 2017-05-17 DIAGNOSIS — C9201 Acute myeloblastic leukemia, in remission: Principal | ICD-10-CM

## 2017-05-17 DIAGNOSIS — Z9484 Stem cells transplant status: Secondary | ICD-10-CM

## 2017-05-17 DIAGNOSIS — T451X5A Adverse effect of antineoplastic and immunosuppressive drugs, initial encounter: Secondary | ICD-10-CM

## 2017-05-17 DIAGNOSIS — Z9481 Bone marrow transplant status: Secondary | ICD-10-CM

## 2017-05-17 DIAGNOSIS — I427 Cardiomyopathy due to drug and external agent: Secondary | ICD-10-CM

## 2017-05-17 DIAGNOSIS — D899 Disorder involving the immune mechanism, unspecified: Principal | ICD-10-CM

## 2017-05-17 LAB — CBC W/ AUTO DIFF
BASOPHILS ABSOLUTE COUNT: 0 10*9/L (ref 0.0–0.1)
BASOPHILS RELATIVE PERCENT: 0.5 %
EOSINOPHILS ABSOLUTE COUNT: 0.1 10*9/L (ref 0.0–0.4)
HEMATOCRIT: 40.4 % (ref 36.0–46.0)
HEMOGLOBIN: 13.5 g/dL (ref 12.0–16.0)
LARGE UNSTAINED CELLS: 2 % (ref 0–4)
LYMPHOCYTES ABSOLUTE COUNT: 0.8 10*9/L — ABNORMAL LOW (ref 1.5–5.0)
LYMPHOCYTES RELATIVE PERCENT: 24.5 %
MEAN CORPUSCULAR HEMOGLOBIN CONC: 33.4 g/dL (ref 31.0–37.0)
MEAN CORPUSCULAR HEMOGLOBIN: 33.4 pg (ref 26.0–34.0)
MEAN CORPUSCULAR VOLUME: 100.1 fL — ABNORMAL HIGH (ref 80.0–100.0)
MEAN PLATELET VOLUME: 7 fL (ref 7.0–10.0)
MONOCYTES ABSOLUTE COUNT: 0.2 10*9/L (ref 0.2–0.8)
MONOCYTES RELATIVE PERCENT: 7.3 %
NEUTROPHILS RELATIVE PERCENT: 61.2 %
PLATELET COUNT: 277 10*9/L (ref 150–440)
RED BLOOD CELL COUNT: 4.03 10*12/L (ref 4.00–5.20)
RED CELL DISTRIBUTION WIDTH: 13.2 % (ref 12.0–15.0)
WBC ADJUSTED: 3.2 10*9/L — ABNORMAL LOW (ref 4.5–11.0)

## 2017-05-17 LAB — COMPREHENSIVE METABOLIC PANEL
ALBUMIN: 4.3 g/dL (ref 3.5–5.0)
ALKALINE PHOSPHATASE: 69 U/L (ref 38–126)
ANION GAP: 9 mmol/L (ref 9–15)
AST (SGOT): 33 U/L (ref 14–38)
BLOOD UREA NITROGEN: 10 mg/dL (ref 7–21)
CALCIUM: 9.9 mg/dL (ref 8.5–10.2)
CHLORIDE: 103 mmol/L (ref 98–107)
CO2: 30 mmol/L (ref 22.0–30.0)
CREATININE: 0.73 mg/dL (ref 0.60–1.00)
EGFR MDRD AF AMER: >=60 mL/min/{1.73_m2} (ref >=60)
EGFR MDRD NON AF AMER: >=60 mL/min/{1.73_m2} (ref >=60)
GLUCOSE RANDOM: 93 mg/dL (ref 65–179)
POTASSIUM: 4.5 mmol/L (ref 3.5–5.0)
PROTEIN TOTAL: 7.1 g/dL (ref 6.5–8.3)
SODIUM: 142 mmol/L (ref 135–145)

## 2017-05-17 LAB — CHLORIDE: Chloride:SCnc:Pt:Ser/Plas:Qn:: 103

## 2017-05-17 LAB — NEUTROPHILS RELATIVE PERCENT: Lab: 61.2

## 2017-05-17 NOTE — Unmapped (Signed)
Addended by: Ventura Sellers A on: 05/17/2017 10:41 AM     Modules accepted: Orders

## 2017-05-17 NOTE — Unmapped (Signed)
BMT Clinic Follow-up Note    Patient Name: Christine [name]  Medical Record Number:  [id number]  Encounter Date: 05/17/2017     BMT Attending MD:  Lanae Boast, MD.     HPI: Christine [name] is a 62 y.o. 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 1 yr 2 mo post transplant.  Tacrolimus stop date 06/15/16.      Interval History:  Christine [name] is feeling well today. She denies fever, chills. Denies cough or SOB. Denies CP.    Her energy has been good.   She denies nausea, vomiting, or diarrhea. She has no new rashes. She denies bleeding, bruising, or infectious symptoms. She continues to have a good appetite and her weight is stable today. She is drinking well and her renal function is good today.     PMH, PSH, and PSocH: Reviewed and there are no changes.     Allergies:  Dapsone; Penicillins; Rasburicase; Sulfasalazine; Vancomycin; Vancomycin analogues; Adhesive; Amoxicillin; Cefepime; Morphine; and Tapentadol   Medications: Reviewed and updated in EPIC.     Review of Systems:  A comprehensive ROS is negative except for as mentioned above in HPI.     Objective:  VS:BP 119/75  - Pulse 84  - Temp 36.6 ??C (97.8 ??F) (Oral)  - Resp 16  - Ht 162.5 cm (5' 3.98)  - Wt 64.1 kg (141 lb 4.8 oz)  - LMP  (LMP Unknown)  - SpO2 100%  - BMI 24.27 kg/m??     KPS: 90%, Normal activity with effort; some signs or symptoms of disease (ECOG equivalent 1)    Acute Graft versus Host Disease Assessment: 05/17/2017  Organ stage: 0  Skin stage 0, No Rash, none, 0 % BSA;   Upper GI stage 0, no nausea and no vomiting;   Lower GI stage 0, No Diarrhea;   Hepatic stage 0, Bilirubin normal - 2mg /dL    Overall grade (Current) None     Physical Exam:   General appearance: Well appearing female in no acute distress. Slightly pale.   Head: Alopecia, wears wig. Normal, non-tender sinuses.  Eyes: PERRL, EOMs intact, conjunctiva and sclera clear.   Throat slightly erythematous.   Neck: Supple, no palpable LAD  Lungs: CTAB. No wheezes, crackles, or rhonchi.   Heart: RRR. Normal s1/s2. No murmur, rub, or gallop.  Abdomen: Soft, non-tender, normoactive BS, no organomegaly.   Extremities: No edema. Pulses palpable in all extremities.   MSK: No joint deformity, tenderness, or swelling. FROM  Skin: Warm, dry, and intact. No rashes.   Line: R PAC accessed. Non-tender and no notable erythema.     Test Results:  Lab Results   Component Value Date    WBC 3.2 (L) 05/17/2017    HGB 13.5 05/17/2017    HCT 40.4 05/17/2017    PLT 277 05/17/2017       Lab Results   Component Value Date    NA 142 05/17/2017    K 4.5 05/17/2017    CL 103 05/17/2017    CO2 30.0 05/17/2017    BUN 10 05/17/2017    CREATININE 0.73 05/17/2017    GLU 93 05/17/2017    CALCIUM 9.9 05/17/2017    MG 1.8 03/15/2017    PHOS 3.0 04/15/2016       Lab Results   Component Value Date    BILITOT 0.4 05/17/2017    BILIDIR <0.10 04/15/2016  PROT 7.1 05/17/2017    ALBUMIN 4.3 05/17/2017    ALT 36 05/17/2017    AST 33 05/17/2017    ALKPHOS 69 05/17/2017    GGT 115 (H) 08/10/2016     ASSESSMENT / IMPRESSION:  **AML:??(FLT3-ITD, +11, and IDH2)??in CR prior to transplant.   06/22/16: BMBx:   - ??Normocellular bone marrow (40%) with trilineage hematopoiesis and 3% blasts.  - ??Routine cytogenetics: donor karyotype; FISH (trisomy 11) results are normal.  - ??Flow cytometric MRD results reveal no evidence of aberrant myeloid antigen expression or abnormal myeloblasts.  - ??DNA Chimerism see below.  - Considered maintenance with FLT3 inhibitor??(sorafenib). Was reluctant to start around D+45-60 due to pancytopenia and changing chimerism. Decided against it. Now she is 10 months after transplant. Discussed maintenance again - decided against it.   ??  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 ??  Conditioning:??  ????????????????????????Busulfan PK calculated from test dose 80 mg/hr daily and Fludarabine 40 mg/m2 daily ??day -5 thru day -2  ????????????????????????Day 0 -??02/24/16  Cell dose:??3.24 x10^8 TNC (Marrow)  Chimerisms/Marrow:  03/31/16: Day 30 marrow normocellular w/1% blasts. FISH and cytos are normal. MRD negative.   BM Chimerisms show 75% donor CD3 and >95% donor in unfractionated compartment.   04/22/16: PB Chimerisms show 71% donor CD3 and >95% donor unfractionated. 05/05/16: BM Chimerisms show 65% donor CD3 and >95% donor unfractionated.   05/18/16: PB Chimerisms show 74% donor CD3 and 95% donor unfractionated.   06/01/16: PB Chimerisms show 69% donor CD3 and >95% donor unfractionated.  06/22/16: PB Chimerisms show 80% donor CD3 and >95% donor unfractionated  06/22/16: BM Chimerisms show 79% donor CD3 and >95% donor unfractionated.   07/06/16: PB Chimerisms show 77% donor CD3 and >95% donor unfractionated.  07/13/16: Off tac x 4 weeks.??Chimerism check next visit.  If no full chimerism at 8-10 weeks after discontinuation of tac will consider DLI. We do not have any donor cells stored. If we need to recollect for DLI, will consider mobilizing the donor and collecting CD3+ cells for DLI as well as CD34+ cells for another transplant.   07/26/16: Off tac almost 6 weeks. PB Chimerisms show 74% donor in CD3 and >95% donor unfractionated.   08/10/16: Off tac 8 weeks. Repeat DNA shows slight increase in CD3 to 79%. Plan after discussion with Dr. Oswald Hillock to re-collect sister for more donor cells for DLI. However, today with??elevated LFTs, this is new, will need to follow these and make sure no GVHD prior to contacting sister for further donation. This was discussed with Christine [name] today.   08/18/16: Off tac 9 weeks.??PB Chimerisms show??85% donor CD3 and >95% donor unfractionated.  08/24/16: Off tac 10 weeks. BM Chimerisms show 79% donor CD3 and >95% donor unfractionated.  09/07/16: Off tac 12 weeks. PB Chimerisms show 91% donor CD3 and >95% donor unfractionated.??  09/28/16: PB chimerism 94% donor CD3 and >95% donor unfractionated.  10/26/16: PB chimerism 90% donor CD3 and >95% donor unfractionated.??  11/23/16: PB chimerism 95% donor CD3 and >95% donor unfractionated.??  12/22/16: PB chimerism 94% donor CD3 and >95% donor unfractionated.??  01/18/17: PB chimerism 93% donor CD3 and >95% donor unfractionated.??  02/15/17: PB chimerism 95% donor CD3 and >95% donor unfractionated.??  03/15/17: PB chimerism 95% donor CD3 and >95% donor unfractionated.??  04/12/17: PB chimerism 95% donor CD3 and >95% donor unfractionated.??  05/17/17: PB chimerism 95% donor CD3 and >95% donor unfractionated.??    GVHD Prophy:??Mixed  chimera.   - Methotrexate on day +1, 3, 6, 11 (received all 4 doses)  - Tac tapered and eventually DC'd 06/15/16????  - ??Has had no e/o GVHD,developed new elevated LFTs noted at visit on 7/31. Liver US(08/18/16): 1.Patent hepatic vasculature with normal flow direction 2.Large simple hepatic cysts.3. Stone in gallbladder.   - 01/18/17: LFTs continue to improve.   - 05/17/17: LFT WNL.    ??  Heme:??CBC stable.   Transfusion criteria: * Pre-med with tylenol and Zyrtec pre- blood or platelet transfusion.   - Hgb <8 and Plt <10K or bleeding.   - Granix if ANC <0.5.   ??  Hx of DVT:  - Completed 6 mos or anticoagulation.     **ID:??  Prophylaxis:   - Continue Valtrex for Zoster prophy 500 mg qd.   - Bactrim stopped due to pancytopenia. Allergic to dapsone. Switched to Pentamidine (Dose #1 - 5/8). Continues on monthly Pentamidine given low CD4 count.   ??  Low CD4  - 06/30/16: Off IS. CD4 count 126.   - 09/07/16: CD4 count 137. ??  - 09/28/16: Received Pentamidine today. Plan to check CD4 level at next visit and continue with monthly pentamidine.   - 10/26/16: CD4 count 180. Continue monthly pentamidine.   - 11/23/16: CD4 count 167. Continue monthly pentamidine.   - 12/22/16: Continue monthly pentamidine (today).  - 01/19/16: Pentamidine due today.    - 02/15/17: Repeat CD4 count 188. Continue pentamidine.   - 03/15/17: Received dose today.   - 04/12/17: Received dose today.   - 05/17/17: CD4 count 216. Received pentamidine today. Will discuss restarting Bactrim DS on Sat and Sun.  ????  Viral studies:  - 11/23/16: Viral panels have been negative. Will stop monitoring unless clinically indicated.    - 03/15/17: Respiratory virus panel positive for rhinovirus. She due due for next set of post-transplant vaccines and Shingrix. In light of possibly URTI will delay until next visit.      Vaccine:   - 09/07/16: First set of post-transplant vaccines. Next set due in the end of February - next visit   Will also consider Shingrix - likely after her immunity is a bit better.    - 03/15/17: Delay vaccines due to cold symptoms. Will plan on the next dose of post-transplant vaccines and Shingrix next visit.   - 04/12/17: Today 12 months post transplant vaccines. First dose of Shingrix vaccine. Next set in 10/2017.   ??  **CV:   Hx of anthracycline induced cardiomyopathy:  - Nov 2017 w/EF 35%. This has now resolved with 01/2016 Echo showing estimated EF of 50%.   - Treated with lisinopril and metoprolol (Metoprolol 50mg  XL).   - Now off all antihypertensives due to intolerance (hypotension).   - 03/15/17: BP WNL. Asymptomatic.   - 04/12/17: Patient reported that her PCP checked her cholesterol and it was borderline high 239. She is wondering if she take a lipid lowering agents recommended by her PCP. I advised her to discuss this with Dr. Barbette Merino whom she is scheduled to see in a few months.   ??  HTN:??Lisinopril stopped d/t AKI.   - 04/07/16: D/C'd Norvasc. Due to asymptomatic hypotension. Pre-transplant blood pressures run in the 100/70-80 range per her report.   Per Dr. Barbette Merino, ok to stop metoprolol given low BPs with plan to repeat Echo in 6-8 weeks.  - 07/06/16: Baseline ECHO today showed EF of 45-50%. DC metoprolol.   - 07/13/16: Follow BP. Will repeat ECHO 8-12  weeks after stopping metoprolol. Repeat ECHO scheduled for 10/26/2016.  - 10/26/16: Repeat ECHO showed normal EF. Ms. Brucker saw Ramond Marrow who decided not to restart ACE inhibitor or beta blocker. Recommended annual follow up with ECHO of the heart.    ??  **GI/Hepatic:??  - Completed VOD prophylaxis.   - Elevated LFTs: Mild elevation at visit on 07/26/16. 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   --- 08/10/16 AST and ALT now 107/170 respectively. Normal amylase and lipase. No abdominal pain or GI symptoms.   --- 08/13/16 ????AST 191 and ALT 283. Alk phos 177.  --- 08/18/16 ??AST 144 and ALT 264. Alk phos 218. Liver US: with patent hepatic vasculature notable for large simple hepatic cyst and gallstones.  --- 09/07/16: AST 136 and ALT 242. Alk phos 197. Stable.   --- 09/28/16: AST 91 and ALT 159. Alk phos 128. LFTs continue to improve.   --- 10/26/16: AST 84 and ALT 139. Alk phos 114. LFTs continue to improve.   --- 11/23/16:  AST 64 and ALT 102. Alk phos 96. LFTs continue to improve.   --- 12/22/16:  AST 51 and ALT 72. Alk phos 79. LFTs continue to improve.   --- 01/18/17: AST 42 and ALT 53. Alk phos 75. LFTs continue to improve.    --- 02/15/17: AST 47 and ALT 53. Alk phos 71. Stable.   --- 05/17/17: LFT WNL.   ??  **Bone Health:  - 03/31/16: DEXA scan showed osteoporosis of the spine and osteopenia of the femoral neck. Zometa and Ca/Vit D.   - 04/02/16: Received 1st dose of Zometa.   - 07/06/16. 2nd dose of Zometa. Completed. Continue Ca/Vit D supplement.   - 03/15/17: Patient received two doses of Zometa. Continue Ca/Vit D supplement. Vit D 38.9 WNL.   ??  Plan: Christine [name] is 1 yr 2 mo out of her allo-SCT. She has always been a full donor chimera is unfractionated compartment but a mixed chimera in CD3 compartment. The CD3 chimerism now stabilized around 95%. Transaminitis coincided with improvement in chimerism, then slowly improved and eventually resolved.    - CD4 counts has improved but today only slightly above 200. still low, will continue pentamidine. Next visit will consider restarting Bactrim.  - Next set of vaccines 18 mo + second dose of Shingrix in 10/2017. - RTC in 4 weeks.       Willaim Bane  05/17/2017  10:34 AM

## 2017-05-17 NOTE — Unmapped (Signed)
Discharged from infusion area ambulatory with no complications noted post pentamidine.

## 2017-05-18 LAB — LYMPH MARKER LIMITED,FLOW
ABSOLUTE CD4 CNT: 216 {cells}/uL — ABNORMAL LOW (ref 510–2320)
ABSOLUTE CD8 CNT: 190 {cells}/uL (ref 180–1520)
CD4% (T HELPER)": 25 % — ABNORMAL LOW (ref 34–58)
CD4:CD8 RATIO: 1.1 (ref 0.9–4.8)
CD8% T SUPPRESR": 22 % (ref 12–38)

## 2017-05-18 LAB — ABSOLUTE CD4 CNT: Cells.CD3+CD4+:NCnc:Pt:XXX:Qn:: 216 — ABNORMAL LOW

## 2017-06-06 IMAGING — CT CT ABD-PELV W/ CM
2 of 5 series · 16 of 46 positions shown, 18 images · IV contrast (isovue)
Comparison: Chest CT yesterday.

CLINICAL DATA: DVT. Recent diagnosis of leukemia. Evaluate liver
mass.

EXAM:
CT ABDOMEN AND PELVIS WITH CONTRAST
TECHNIQUE: Multidetector CT imaging of the abdomen and pelvis was performed
using the standard protocol following bolus administration of
intravenous contrast.
CONTRAST:  100 cc Isovue 370

[Series 2: axial st · axial · 0.74mm/px · z∈[-986,-621]mm · 13 of 83 slices shown, 15 images]
[im 5/83  soft-tissue]
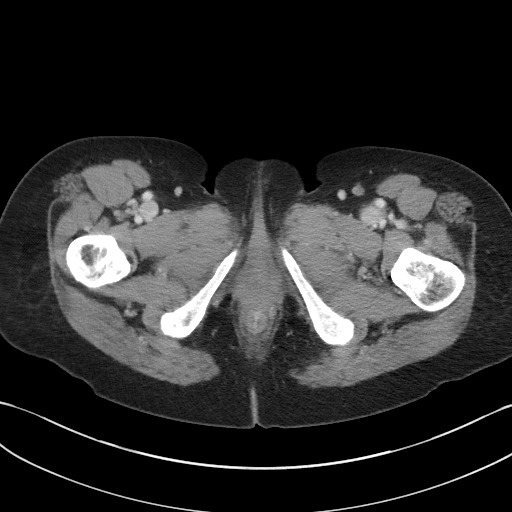
[im 5/83  bone]
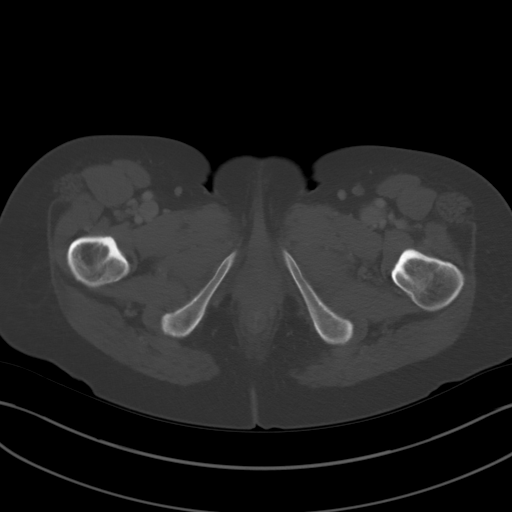
[im 13/83  soft-tissue]
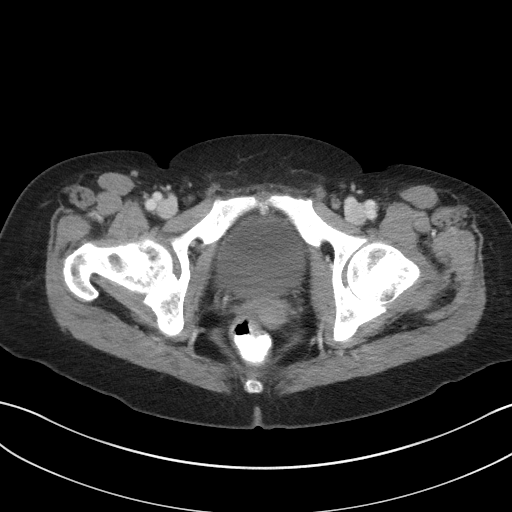
[im 18/83  soft-tissue]
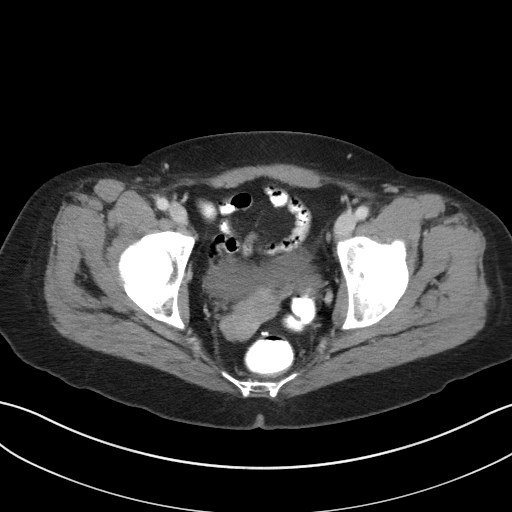
[im 22/83  soft-tissue]
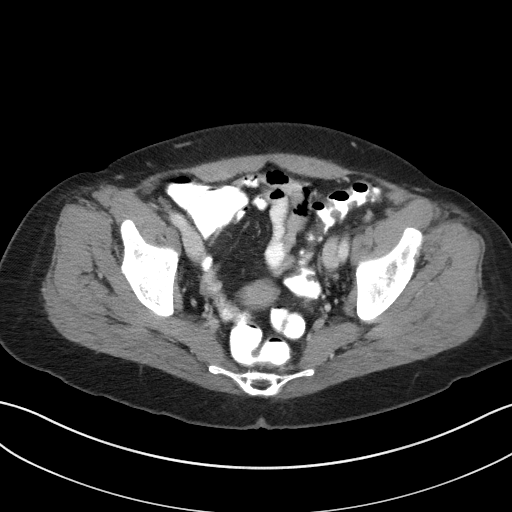
[im 31/83  soft-tissue]
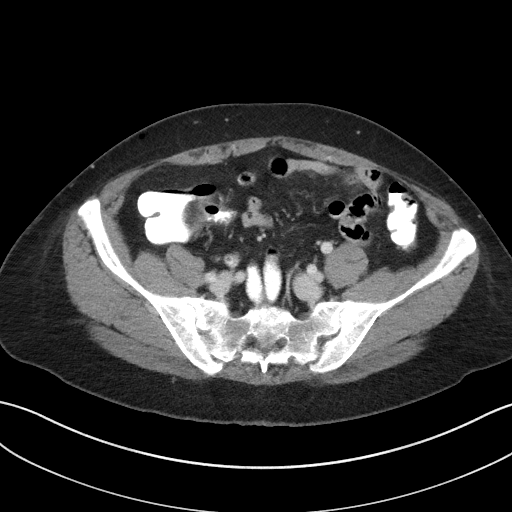
[im 35/83  soft-tissue]
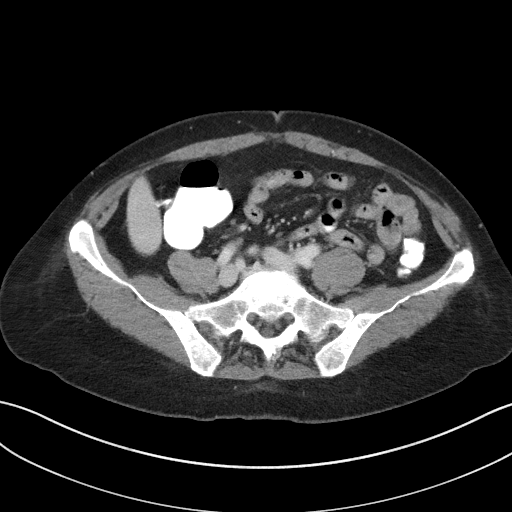
[im 44/83  soft-tissue]
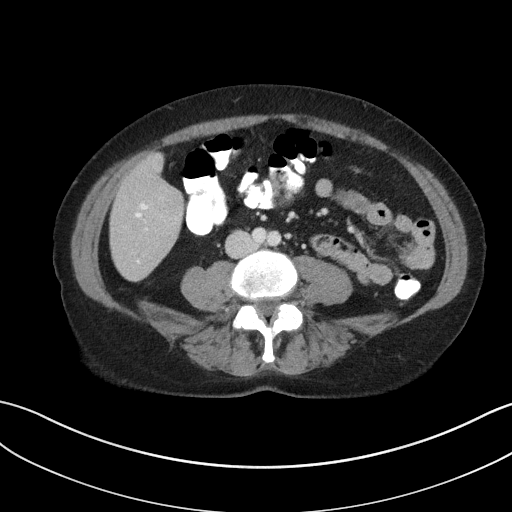
[im 48/83  soft-tissue]
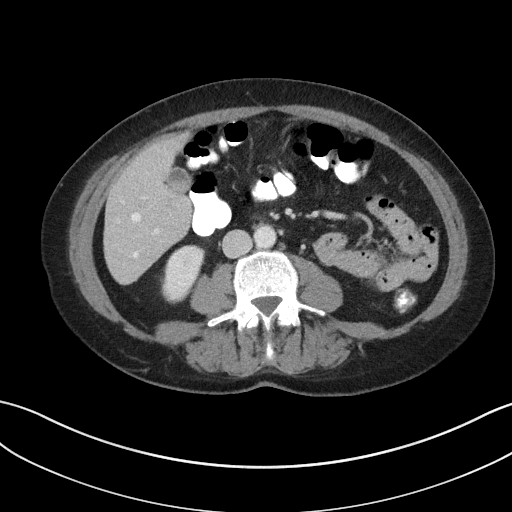
[im 52/83  soft-tissue]
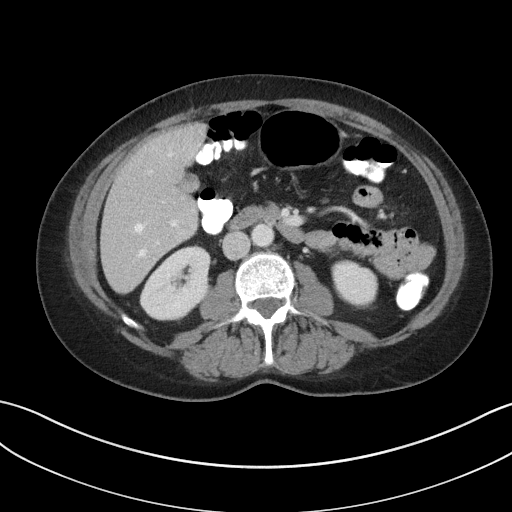
[im 52/83  bone]
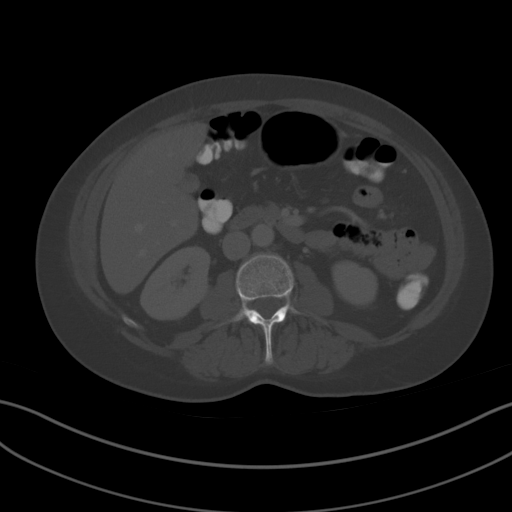
[im 61/83  soft-tissue]
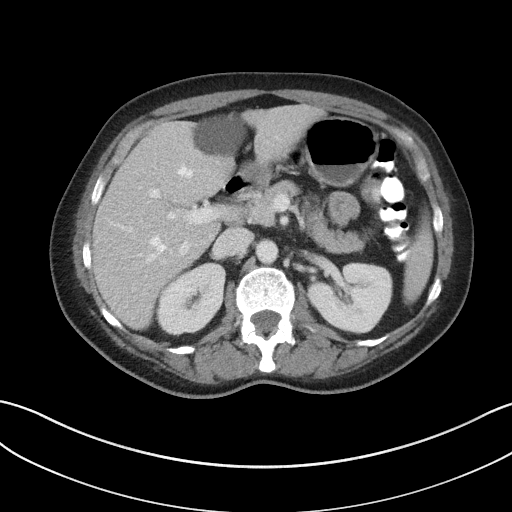
[im 65/83  soft-tissue]
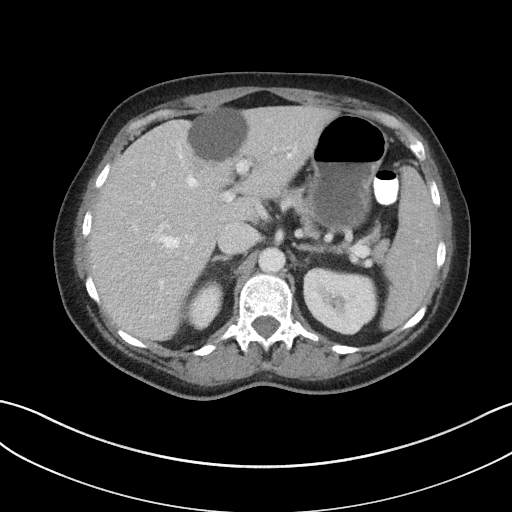
[im 70/83  soft-tissue]
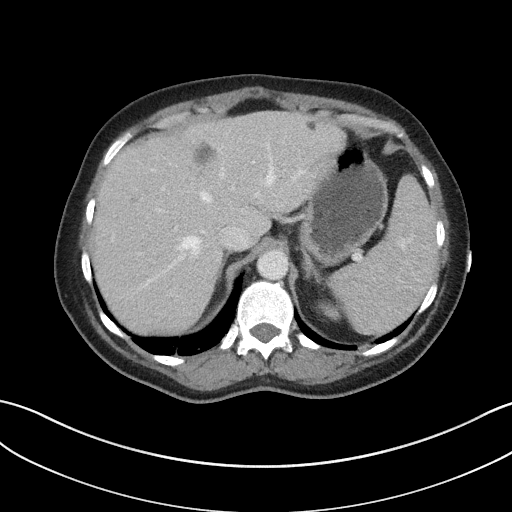
[im 78/83  soft-tissue]
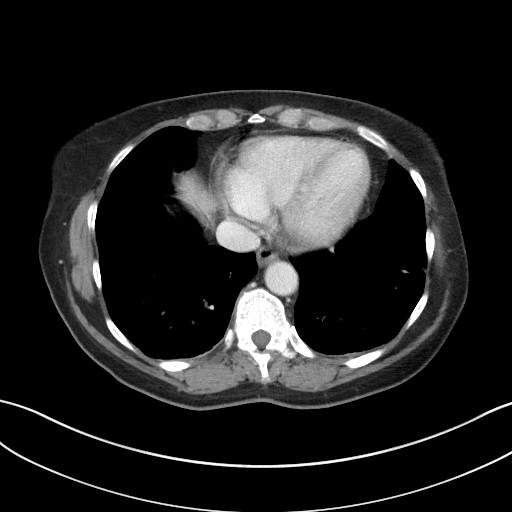

[Series 5: coronal st · coronal · 0.67mm/px · 3 of 80 slices shown]
[im 27/80  soft-tissue]
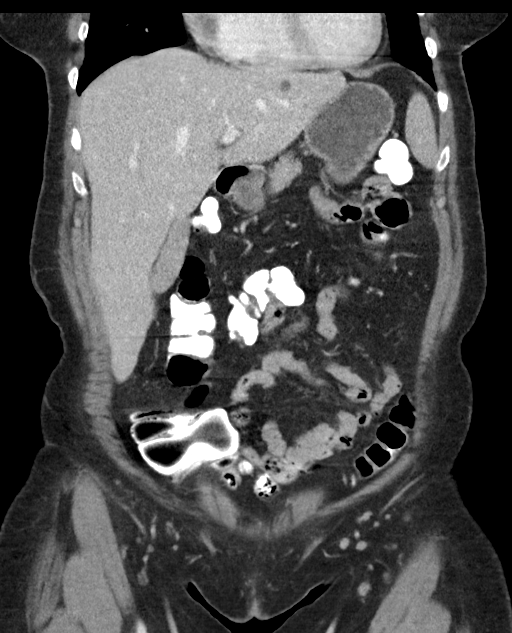
[im 36/80  soft-tissue]
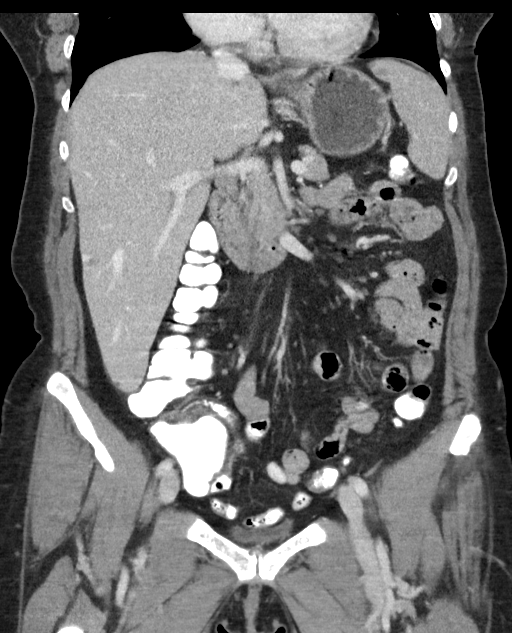
[im 44/80  soft-tissue]
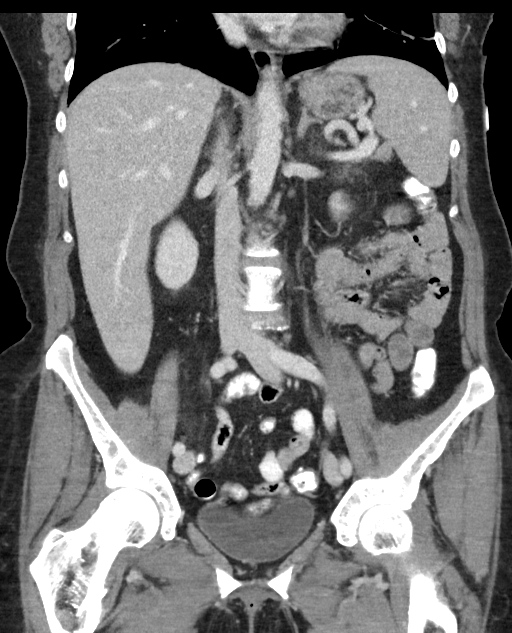

[16 of 46 positions shown; findings below may reference images not displayed]

FINDINGS: Lower chest:  Linear scarring at the bases.

Hepatobiliary: There are multiple well-circumscribed low densities
within the liver consistent with benign cysts. Most of these are 1
cm or smaller and scattered throughout the liver. There are 2
adjacent larger cysts at the junction of the right and left lobes,
the upper component measuring 2.9 cm in diameter in the lower
component measuring 4.8 cm in diameter. Along the lateral margin of
the posterior segment of the right lobe, image 34, there is what
likely represents a small hemangioma. There are no worrisome liver
lesions. No calcified gallstones. Some excreted contrast in the
gallbladder from yesterday's study. No biliary ductal dilatation.

Pancreas: Normal

Spleen: 2 small cysts at the anterior upper corner, not significant.
Otherwise normal.

Adrenals/Urinary Tract: Adrenal glands are normal. Kidneys are
normal. No bladder lesion.

Stomach/Bowel: No bowel lesion evident.

Vascular/Lymphatic: Aortic atherosclerosis. No aneurysm. No
retroperitoneal mass or lymphadenopathy.

Reproductive: Postmenopausal uterus with at least 1 leiomyoma. No
adnexal lesion.

Other: None

Musculoskeletal: Chronic degenerative changes in the lower lumbar
spine.
IMPRESSION: Low-density areas in the liver are consistent with benign cysts.
Probable 1 cm hemangioma along the lateral margin of the posterior
segment of the right lobe caudally. No further follow-up suggested.

## 2017-06-08 ENCOUNTER — Ambulatory Visit
Admit: 2017-06-08 | Discharge: 2017-06-10 | Disposition: A | Discharge status: Home / Self Care | Payer: PRIVATE HEALTH INSURANCE

## 2017-06-08 ENCOUNTER — Ambulatory Visit: Payer: BLUE CROSS/BLUE SHIELD | Admitting: Family

## 2017-06-08 ENCOUNTER — Encounter: Payer: Self-pay | Admitting: Family

## 2017-06-08 VITALS — BP 126/78 | HR 128 | Temp 101.2°F | Wt 141.4 lb

## 2017-06-08 DIAGNOSIS — J181 Lobar pneumonia, unspecified organism: Principal | ICD-10-CM

## 2017-06-08 DIAGNOSIS — J189 Pneumonia, unspecified organism: Secondary | ICD-10-CM | POA: Insufficient documentation

## 2017-06-08 DIAGNOSIS — R509 Fever, unspecified: Secondary | ICD-10-CM

## 2017-06-08 LAB — POC INFLUENZA A&B (BINAX/QUICKVUE)
Influenza A, POC: NEGATIVE
Influenza B, POC: NEGATIVE

## 2017-06-08 LAB — POCT RAPID STREP A (OFFICE): Rapid Strep A Screen: NEGATIVE

## 2017-06-08 LAB — CBC W/ AUTO DIFF
BASOPHILS ABSOLUTE COUNT: 0 10*9/L (ref 0.0–0.1)
BASOPHILS RELATIVE PERCENT: 0.1 %
EOSINOPHILS ABSOLUTE COUNT: 0 10*9/L (ref 0.0–0.4)
EOSINOPHILS RELATIVE PERCENT: 0.2 %
HEMATOCRIT: 38 % (ref 36.0–46.0)
LYMPHOCYTES ABSOLUTE COUNT: 0.5 10*9/L — ABNORMAL LOW (ref 1.5–5.0)
LYMPHOCYTES RELATIVE PERCENT: 7.1 %
MEAN CORPUSCULAR HEMOGLOBIN CONC: 32.8 g/dL (ref 31.0–37.0)
MEAN CORPUSCULAR VOLUME: 100 fL (ref 80.0–100.0)
MEAN PLATELET VOLUME: 7.3 fL (ref 7.0–10.0)
MONOCYTES ABSOLUTE COUNT: 0.3 10*9/L (ref 0.2–0.8)
MONOCYTES RELATIVE PERCENT: 4.2 %
NEUTROPHILS ABSOLUTE COUNT: 5.5 10*9/L (ref 2.0–7.5)
NEUTROPHILS RELATIVE PERCENT: 87.1 %
PLATELET COUNT: 221 10*9/L (ref 150–440)
RED BLOOD CELL COUNT: 3.8 10*12/L — ABNORMAL LOW (ref 4.00–5.20)
RED CELL DISTRIBUTION WIDTH: 12.9 % (ref 12.0–15.0)
WBC ADJUSTED: 6.3 10*9/L (ref 4.5–11.0)

## 2017-06-08 LAB — URINALYSIS
BACTERIA: NONE SEEN /HPF
BILIRUBIN UA: NEGATIVE
BLOOD UA: NEGATIVE
GLUCOSE UA: NEGATIVE
KETONES UA: NEGATIVE
LEUKOCYTE ESTERASE UA: NEGATIVE
NITRITE UA: NEGATIVE
RBC UA: 1 /HPF (ref <=4)
SPECIFIC GRAVITY UA: 1.002 — ABNORMAL LOW (ref 1.003–1.030)
SQUAMOUS EPITHELIAL: <1 /HPF (ref 0–5)
UROBILINOGEN UA: 0.2
WBC UA: <1 /HPF (ref 0–5)

## 2017-06-08 LAB — COMPREHENSIVE METABOLIC PANEL
ALBUMIN: 4.3 g/dL (ref 3.5–5.0)
ALKALINE PHOSPHATASE: 117 U/L (ref 38–126)
ALT (SGPT): 48 U/L (ref 15–48)
ANION GAP: 11 mmol/L (ref 9–15)
AST (SGOT): 52 U/L — ABNORMAL HIGH (ref 14–38)
BILIRUBIN TOTAL: 0.7 mg/dL (ref 0.0–1.2)
BLOOD UREA NITROGEN: 12 mg/dL (ref 7–21)
BUN / CREAT RATIO: 17
CALCIUM: 9.5 mg/dL (ref 8.5–10.2)
CHLORIDE: 96 mmol/L — ABNORMAL LOW (ref 98–107)
CO2: 25 mmol/L (ref 22.0–30.0)
CREATININE: 0.71 mg/dL (ref 0.60–1.00)
EGFR MDRD NON AF AMER: >=60 mL/min/{1.73_m2} (ref >=60)
GLUCOSE RANDOM: 199 mg/dL — ABNORMAL HIGH (ref 65–179)
POTASSIUM: 3.8 mmol/L (ref 3.5–5.0)
PROTEIN TOTAL: 7 g/dL (ref 6.5–8.3)
SODIUM: 132 mmol/L — ABNORMAL LOW (ref 135–145)

## 2017-06-08 LAB — SMEAR REVIEW

## 2017-06-08 LAB — BASOPHILS ABSOLUTE COUNT: Lab: 0

## 2017-06-08 LAB — POTASSIUM: Potassium:SCnc:Pt:Ser/Plas:Qn:: 3.8

## 2017-06-08 LAB — MAGNESIUM: Magnesium:MCnc:Pt:Ser/Plas:Qn:: 1.8

## 2017-06-08 LAB — PROTEIN UA: Protein:MCnc:Pt:Urine:Qn:Test strip: NEGATIVE

## 2017-06-08 LAB — TROPONIN I: Troponin I.cardiac:MCnc:Pt:Ser/Plas:Qn:: <0.034

## 2017-06-08 LAB — SLIDE REVIEW

## 2017-06-08 NOTE — Progress Notes (Signed)
Subjective:    Patient ID: Robyn Montgomery, female    DOB: 1955-10-17, 62 y.o.   MRN: [id number]  CC: Robyn Montgomery is a 62 y.o. female who presents today for an acute visit.    HPI: CC: fever, cough x 4 days ago,  Clear congestion, HA.  Tmax 102, 103. Even with tylonol , not less than 100.   NO dysuria, low back pain, neck pain, vision changes.   Husband had fever at home; had overnight and has since resolved.   Transplant 15 months ago.   H/o AML   HISTORY:  Past Medical History:  Diagnosis Date  . Cancer ([place])    leukemia- post stem cell transplant  . Osteoporosis   . Personal history of chemotherapy    Past Surgical History:  Procedure Laterality Date  . BREAST BIOPSY Left    mole removed above left nipple, no bx  . broken shoulder Right 2012   Plates and screws put in  . MOLE REMOVAL  1977   Removed from left breast  . SHOULDER SURGERY     Family History  Problem Relation Age of Onset  . Arthritis Mother   . Hypertension Mother   . Cancer Mother 52       Breast  . Osteoporosis Mother   . Breast cancer Mother 86  . Parkinsonism Father   . Heart disease Maternal Uncle   . Cancer Paternal Grandfather        Oral    Allergies: Dapsone; Rasburicase; Sulfasalazine; Penicillins; Sulfa antibiotics; Vancomycin; Cefepime; Morphine; Tape; and Tapentadol Current Outpatient Medications on File Prior to Visit  Medication Sig Dispense Refill  . Calcium Carb-Cholecalciferol 600-800 MG-UNIT CHEW Chew by mouth.    . enoxaparin (LOVENOX) 60 MG/0.6ML injection Inject 60 mg into the skin.    . hydrOXYzine (ATARAX/VISTARIL) 25 MG tablet   0  . lisinopril (PRINIVIL,ZESTRIL) 5 MG tablet   2  . metoprolol succinate (TOPROL-XL) 50 MG 24 hr tablet Take 100 mg by mouth.    . ondansetron (ZOFRAN-ODT) 4 MG disintegrating tablet Take 4 mg by mouth.    . prednisoLONE acetate (PRED FORTE) 1 % ophthalmic suspension   0  . valACYclovir (VALTREX) 500 MG tablet TAKE 1 TABLET BY MOUTH  ONCE DAILY     No current facility-administered medications on file prior to visit.     Social History   Tobacco Use  . Smoking status: Never Smoker  . Smokeless tobacco: Never Used  Substance Use Topics  . Alcohol use: Yes    Alcohol/week: 0.0 oz    Comment: Rare  . Drug use: No    Review of Systems  Constitutional: Negative for chills and fever.  HENT: Positive for congestion. Negative for sinus pressure, sinus pain and sore throat.   Respiratory: Positive for cough. Negative for shortness of breath and wheezing.   Cardiovascular: Negative for chest pain and palpitations.  Gastrointestinal: Negative for nausea and vomiting.  Neurological: Positive for headaches.      Objective:    There were no vitals taken for this visit.   Physical Exam  Constitutional: She appears well-developed and well-nourished.  HENT:  Head: Normocephalic and atraumatic.  Right Ear: Hearing, tympanic membrane, external ear and ear canal normal. No drainage, swelling or tenderness. No foreign bodies. Tympanic membrane is not erythematous and not bulging. No middle ear effusion. No decreased hearing is noted.  Left Ear: Hearing, tympanic membrane, external ear and ear canal normal. No drainage, swelling  or tenderness. No foreign bodies. Tympanic membrane is not erythematous and not bulging.  No middle ear effusion. No decreased hearing is noted.  Nose: Nose normal. No rhinorrhea. Right sinus exhibits no maxillary sinus tenderness and no frontal sinus tenderness. Left sinus exhibits no maxillary sinus tenderness and no frontal sinus tenderness.  Mouth/Throat: Uvula is midline, oropharynx is clear and moist and mucous membranes are normal. No oropharyngeal exudate, posterior oropharyngeal edema, posterior oropharyngeal erythema or tonsillar abscesses.  Eyes: Conjunctivae are normal.  Cardiovascular: Regular rhythm, normal heart sounds and normal pulses.  Pulmonary/Chest: Effort normal. She has no  wheezes. She has no rhonchi. She has rales in the right lower field and the left lower field.  Few crackles heard at bases of lungs  Lymphadenopathy:       Head (right side): No submental, no submandibular, no tonsillar, no preauricular, no posterior auricular and no occipital adenopathy present.       Head (left side): No submental, no submandibular, no tonsillar, no preauricular, no posterior auricular and no occipital adenopathy present.    She has no cervical adenopathy.  Neurological: She is alert.  Skin: Skin is warm and dry.  Psychiatric: She has a normal mood and affect. Her speech is normal and behavior is normal. Thought content normal.  Vitals reviewed.      Assessment & Plan:   1. Fever in adult Febrile x 4 days of unknown source.  tachycardic.  15 months out after stem cell transplant.  Discussed with patient and husband at bedside at length this afternoon regarding my concern for higher level of care.  They were in agreement.  Advised him to go to the emergency room, they prefer to get a [facility] where oncologist associated.  Flu, strep negative.  Husband will drive patient to emergency room. - POC Influenza A&B (Binax test) - POCT rapid strep A    I am having Dakota "Pam" maintain her enoxaparin, metoprolol succinate, ondansetron, hydrOXYzine, lisinopril, prednisoLONE acetate, valACYclovir, and Calcium Carb-Cholecalciferol.   No orders of the defined types were placed in this encounter.   Return precautions given.   Risks, benefits, and alternatives of the medications and treatment plan prescribed today were discussed, and patient expressed understanding.   Education regarding symptom management and diagnosis given to patient on AVS.  Continue to follow with Burnard Hawthorne, FNP for routine health maintenance.   [place] and I agreed with plan.   Mable Paris, FNP

## 2017-06-08 NOTE — Patient Instructions (Signed)
Please go to [facility] ED as we discussed  Im thinking of you.

## 2017-06-08 NOTE — Unmapped (Signed)
Patient reports URI symptoms for about 5 days.  Mild fever, dry cough and nasal and head congestion.  Mild green nasal drainage when blows nose.  Instructed to be assessed locally for flu or virus.  Verbalized understanding.

## 2017-06-09 DIAGNOSIS — J181 Lobar pneumonia, unspecified organism: Principal | ICD-10-CM

## 2017-06-09 LAB — CBC W/ AUTO DIFF
BASOPHILS RELATIVE PERCENT: 0.3 %
EOSINOPHILS ABSOLUTE COUNT: 0 10*9/L (ref 0.0–0.4)
EOSINOPHILS RELATIVE PERCENT: 0.1 %
HEMATOCRIT: 34.4 % — ABNORMAL LOW (ref 36.0–46.0)
HEMOGLOBIN: 11.5 g/dL — ABNORMAL LOW (ref 12.0–16.0)
LARGE UNSTAINED CELLS: 2 % (ref 0–4)
LYMPHOCYTES ABSOLUTE COUNT: 0.8 10*9/L — ABNORMAL LOW (ref 1.5–5.0)
LYMPHOCYTES RELATIVE PERCENT: 12.5 %
MEAN CORPUSCULAR HEMOGLOBIN CONC: 33.5 g/dL (ref 31.0–37.0)
MEAN CORPUSCULAR HEMOGLOBIN: 33.1 pg (ref 26.0–34.0)
MEAN CORPUSCULAR VOLUME: 98.9 fL (ref 80.0–100.0)
MEAN PLATELET VOLUME: 6.9 fL — ABNORMAL LOW (ref 7.0–10.0)
MONOCYTES ABSOLUTE COUNT: 0.5 10*9/L (ref 0.2–0.8)
MONOCYTES RELATIVE PERCENT: 7.9 %
NEUTROPHILS ABSOLUTE COUNT: 4.7 10*9/L (ref 2.0–7.5)
NEUTROPHILS RELATIVE PERCENT: 76.9 %
RED CELL DISTRIBUTION WIDTH: 13 % (ref 12.0–15.0)
WBC ADJUSTED: 6.1 10*9/L (ref 4.5–11.0)

## 2017-06-09 LAB — BASIC METABOLIC PANEL
ANION GAP: 10 mmol/L (ref 9–15)
BLOOD UREA NITROGEN: 9 mg/dL (ref 7–21)
BUN / CREAT RATIO: 15
CALCIUM: 8.9 mg/dL (ref 8.5–10.2)
CO2: 23 mmol/L (ref 22.0–30.0)
CREATININE: 0.6 mg/dL (ref 0.60–1.00)
EGFR MDRD AF AMER: >=60 mL/min/{1.73_m2} (ref >=60)
EGFR MDRD NON AF AMER: >=60 mL/min/{1.73_m2} (ref >=60)
GLUCOSE RANDOM: 88 mg/dL (ref 65–179)
SODIUM: 138 mmol/L (ref 135–145)

## 2017-06-09 LAB — PRO-BNP: Natriuretic peptide.B prohormone N-Terminal:MCnc:Pt:Ser/Plas:Qn:: 400 — ABNORMAL HIGH

## 2017-06-09 LAB — GLUCOSE RANDOM: Glucose:MCnc:Pt:Ser/Plas:Qn:: 88

## 2017-06-09 LAB — MEAN CORPUSCULAR VOLUME: Lab: 98.9

## 2017-06-09 NOTE — Unmapped (Signed)
General Medicine History and Physical    Assessment/Plan:    Principal Problem:    Pneumonia of both lower lobes due to infectious organism (CMS-HCC)  Active Problems:    Acute myeloid leukemia (CMS-HCC)      Christine [name] is a 62 y.o. y/o female with PMHx as noted below that presents to [facility] with Pneumonia of both lower lobes due to infectious organism (CMS-HCC).      Pneumonia   On 2LNC in the ED. Likely 2/2 infectious process given febrile illness. With history of borderline EF, Pro-BNP added on as well. ABG reassuring, though PO2 only 80 on supplemental oxygen.   - Continue O2, wean prn  - IV levaquin 750mg  q 24 h given significant allergies  - CT chest w/o con ordered. If concern for infiltrates, may need bronch. NPO ordered for MN     AML s/p Allo SCT   Admit to BMT.   - Prophylactic Valtrex ordered  - Will require pre-med with tylenol and zyrtec if blood products given      FEN  -- Replete lytes prn  -- Fluids: LR at 58ml/hr given hx of borderline EF  -- regular diet, NPO at MN     Code Status:  FULL CODE  ___________________________________________________________________    Chief Complaint  Chief Complaint   Patient presents with   ??? Fever Between 14 Weeks and 62 Years Old       HPI:  Christine [name] is a 62 y.o. y/o female with PMHx as noted below that presents to [facility] with Pneumonia of both lower lobes due to infectious organism (CMS-HCC).    She has a previous diagnosis of AML and is 14 months s/p allo SCT. She presented to the ED with a 4 day history of cough, fever (tmax 103), headache, body aches, poor appetite. Denies any nausea, vomiting, diarrhea, shortness of breath. Her husband has a similar illness that started on Sunday as well, though he has improved. Noted that they attended an outdoor concert Friday evening and started to feel poorly a day after, though cannot identify any sick contacts. She called the BMT clinic today and was advised to present to the ED due to the duration and grade of her fever. Upon arrival to the ED was noted to have increased O2 requirement.     Review of Systems:  10 systems reviewed and are negative unless otherwise mentioned in HPI    Allergies:  Dapsone; Penicillins; Rasburicase; Sulfasalazine; Vancomycin; Vancomycin analogues; Adhesive; Amoxicillin; Cefepime; Morphine; and Tapentadol    Medications:   Prior to Admission medications    Medication Sig Start Date End Date Taking? Authorizing Provider   calcium carbonate-vitamin D3 600 mg (1,500 mg)-800 unit Chew Chew 1 tablet 2 (two) times a day with meals. 03/31/16  Yes Rulon Abide, PharmD CPP   valACYclovir (VALTREX) 500 MG tablet TAKE 1 TABLET BY MOUTH ONCE DAILY 02/11/17  Yes Artelia Laroche, MD       Medical History:  Past Medical History:   Diagnosis Date   ??? 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 AML (acute myelogenous leukemia) (CMS-HCC)    ??? 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 DVT (deep venous thrombosis) (CMS-HCC)    ???  Hypertension    ??? Osteoporosis     Stopped medications due to interactions with chemo drugs       Surgical History:  Past Surgical History:   Procedure Laterality Date   ??? IR INSERT PORT AGE GREATER THAN 5 YRS  12/19/2015    IR INSERT PORT AGE GREATER THAN 5 YRS 12/19/2015 Ammie Dalton, MD IMG VIR MM MMNT   ??? PR UPPER GI ENDOSCOPY,BIOPSY N/A 05/05/2016    Procedure: UGI ENDOSCOPY; WITH BIOPSY, SINGLE OR MULTIPLE;  Surgeon: Jolyn Lent, MD;  Location: GI PROC[facility];  Service: Gastroenterology   ??? SHOULDER SURGERY         Social History:  Tobacco use:   reports that she has never smoked. She has never used smokeless tobacco.  Alcohol use:   reports that she does not drink alcohol.  Drug use:  reports that she does not use drugs.    Family History:  Family History   Problem Relation Age of Onset   ??? Breast cancer Mother    ??? Cancer Paternal Grandfather         oral   ??? Cancer Maternal Aunt         unknown primary   ??? Asthma Neg Hx    ??? Allergic rhinitis Neg Hx    ??? Eczema Neg Hx        Physical Examination  Temp:  [37.5 ??C (99.5 ??F)-37.7 ??C (99.9 ??F)] 37.5 ??C (99.5 ??F)  Heart Rate:  [105-128] 105  SpO2 Pulse:  [117-123] 117  Resp:  [16-21] 16  BP: (109-127)/(66-78) 109/66  MAP (mmHg):  [85-90] 85  FiO2 (%):  [28 %] 28 %  SpO2:  [92 %-96 %] 96 %  General appearance - Normal, cooperative, in no acute distress, alert  Skin - Skin color, texture, turgor normal.   Eyes - Conjunctivae/corneas clear. PERRL, EOM's intact. No icterus.  HEENT - Neck Supple. Moist mucous membranes.  Lungs -  Normal work of breathing. Crackles in bases of lungs, L>R  Heart - Regular rate and rhythm.  Abdomen - Abdomen soft, non-tender.   Extremities -  Extremities normal. No deformities, edema, or skin discoloration  Peripheral pulses - normal, capilliary refill <2secs    Test Results:  Data Review:  I have reviewed the labs and studies from the last 24 hours.    Imaging: Radiology studies were personally reviewed

## 2017-06-09 NOTE — Unmapped (Signed)
Patient returned from X-ray  Transported by Radiology  How tranported Stretcher  Cardiac Monitor yes

## 2017-06-09 NOTE — Unmapped (Addendum)
ED Progress Note    Received sign out from Dr. Lelan Pons    ED I-PASS Handoff  ?? Illness Severit: stable  Patient Summary: Christine [name] is a 62 y.o. female with a PMH of HTN, chemotherapy induced cardiomyopathy now resolved (EF 55% 10/2016) and AML now in remission s/p stem cell transplant presenting with PNA. On Levaquin. Satting 95% on 3L [place].   ??   ?? Action List: Will be going to BMT  ?? Situation Awareness (Contingency Planning): Anticipate admission. Synthesis by Receiver

## 2017-06-09 NOTE — Unmapped (Signed)
Patient rounds completed. The following patient needs were addressed:  Pain, Personal Belongings, Plan of Care, Call Bell in Reach and Bed Position Low .

## 2017-06-09 NOTE — Unmapped (Signed)
BMT Inpatient Progress Note   ??  Patient Name: Christine [name]  Medical Record Number:  [id number]  Admission Date: 06/08/2017  ??  BMT Attending MD:  Lanae Boast, MD.   ??  HPI: Christine [name] is a 62 y.o. 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 1 yr 3 mo post transplant.   ??  Interval History:  Christine [name] was re-admitted overnight for fever work-up. She called the BMT clinic yesterday afternoon with URI symptoms x4d - cough, congestion, malaise and fever [w/Tmax 103F]. Her husband had also been sick earlier in the week. She was taking Tylenol for fever and Nyquil for congestion with minimal relief. She was instructed to see her PCP who tested her for strep [negative] and recommended she go to the ED for further work-up. Upon arrival to the ED, she was tachycardic but stable with an O2 sat of 92%. CXR showed possible PNA on the right. Blood and urine cultures were sent along with a rapid flu/RSV [negative[. She was given IVFs for tachycardia. She was placed on levaquin 750mg  given prior antibiotic allergies. She was transferred to the floor in stable condition.    CT scan to evaluate CXR findings revealed tree in bud and reticulonodular opacities in the right upper, right middle, and bilateral lower lobes, that are concerning for infection. Medial bibasilar peribronchial consolidative opacities were also noted. She is afebrile, less tachy and O2 sats are 95-96% on 2L [place]. She has no other current c/o and infectious work-up is still in process.     Oncology History    Referring/Local Oncologist: none    Diagnosis: de novo AML with mutated FLT3    Genetics:   Karyotype/FISH: 47XX +11     Molecular Genetics: FLT3 ITD present; IDH2: c.419G>A [p.Arg140Gln]  ??  No variants detected in ASXL1, BCOR, CEBPA, DNMT3A, ETV6/TEL, EZH2, FLT3, IDH1, KIT, NPM1, NRAS, RUNX1, SF3B1, SRSF2, STAG2, TET2, TP53, U2AF1, WT1, ZRSR2    Pertinent Phenotypic data: blasts expressed CD4, CD11b, CD11c, CD33, CD34, CD64, CD117, and HLA-DR and lack expression of other informative analyzed markers including B and T-cell antigens.     Disease-specific prognostic estimation: ELN Adverse (per FLT3 allelic ratio high per estimate by molecular pathologist)          Acute myeloid leukemia (CMS-HCC)    08/29/2015 Initial Diagnosis     Acute myeloid leukemia (RAF-HCC)         09/02/2015 -  Chemotherapy     Induction therapy with daunorubicin 60 mg/m2 IV daily x 3, cytarabine 200 mg/m2/d CIV x 7 days, midostaurin 50 mg PO BID days 8-21         09/22/2015 Biopsy     50% cellular marrow with 50% blasts         09/25/2015 -  Chemotherapy     CLAG, cladribine 5 mg/m2/day days 1-5, cytarabine 2 grams/m2 IV days 1-5, GCSF days 0-5         10/23/2015 Remission     CR         11/03/2015 -  Chemotherapy     Cytarabine 1.5 g/m2 IV Q12 hrs days 1, 3, 5         11/04/2015 Adverse Reaction     Cardiomyopathy         11/28/2015 Adverse Reaction     Stenotrophomonas maltophilia CVAD infection         12/19/2015 -  Chemotherapy     Cytarabine 1.5 g/m2 IV Q12 hrs days 1, 3, 5         02/24/2016 Transplant     CTN 1301 (control arm) conditioning regimen with Busulfan and Fludarabine followed by matched sibling allo, received bone marrow product with cell dose of 3.18 x 10^6.           Review of Systems:  A comprehensive review of systems was negative except for pertinent positives noted in HPI.     Vitals:    06/09/17 1226   BP: 121/73   Pulse: 100   Resp: 18   Temp: 36.9 ??C (98.5 ??F)   SpO2: 94%     KPS: 90%, Normal activity with effort; some signs or symptoms of disease (ECOG equivalent 1)  ??  Acute Graft versus Host Disease Assessment: 06/09/17    Organ stage: 0  Skin stage 0, No Rash, none, 0 % BSA;   Upper GI stage 0, no nausea and no vomiting;   Lower GI stage 0, No Diarrhea;   Hepatic stage 0, Bilirubin normal - 2mg /dL  ??  Overall grade (Current) None   ??  Physical Exam: General appearance: Well appearing female in no acute distress.   CVAD: PIV only, no central access.   Head: Alopecia, wears wig. Normal, non-tender sinuses.  Eyes: PERRL, EOMs intact, conjunctiva and sclera clear.   Neck: Supple, no palpable LAD  Lungs: No wheezes, crackles, or rhonchi. Decreased in bases, R>L.   Heart: RRR. Normal s1/s2. No murmur, rub, or gallop.  Abdomen: Soft, non-tender, normoactive BS, no organomegaly.   Extremities: No edema. Pulses palpable in all extremities.   MSK: No joint deformity, tenderness, or swelling. FROM  Skin: Warm, dry, and intact. No rashes.   Line: R PAC accessed. Non-tender and no notable erythema.     BMT/Heme:  - Counts are stable. Mild anemia.    - Standard transfusion criteria.      ID:   Prophylaxis:  - Valtrex 500mg  qd  - Pentamidine [5/8]    ** CD4 count 216 on 05/17/17     Pneumonia:   - Rapid flu/RSV negative.  - Sending extended resp viral panel today - maintain droplet precautions.  - Change levaquin to cefepime 2g q8hrs and pre-med with 25mg  po benadryl.     ** Defer bronchoscopy for now.   - Await blood and urine cultures.   - Wean off of supplemental O2.   ??  AML s/p Allo SCT   Admit to BMT.   - Prophylactic Valtrex ordered  - Will require pre-med with tylenol and zyrtec if blood products given  ????  FEN  - Replete lytes prn  - Fluids: LR at 11ml/hr given hx of borderline EF  - Regular diet.   ??  Code Status:  FULL CODE    Roxanne Orner A. Marisa Hua, FNP-BC  Nurse Practitioner - Adult BMT

## 2017-06-09 NOTE — Unmapped (Signed)
Patient on the BMTU  Neuro/Pain:  A&O; no pain  CV/Resp/PV:  VSS; weaned from 2L to RA today; on continuous O2 monitoring; no calls this shift, low-mid 90's on RA.  Pt educated on  importance of coughing and deep breathing and encouraged to use incentive spirometer; pt declined incentive spirometer.  ID / BMTU Tx Plan:  ABX changed to IV cefepime today; premedicated with benadryl prior to admin; no issues noted.  RPP/RVP sent today; result paraflu +.  Droplet/Contact precautions maintained.  FENGI/GU:  WNL  Musc/Falls:  WNL  Access:  PIV positional, but patent and benign; maintained for IV ABX.  Skin/Wounds:  WNL  Social:  Spouse staying locally; reports also being sick with cold/cough; wears mask and gown when visiting patient.  Patient Education:  See pt education.    Problem: Adult Inpatient Plan of Care  Goal: Plan of Care Review  Outcome: Progressing  Goal: Patient-Specific Goal (Individualization)  Outcome: Progressing  Goal: Absence of Hospital-Acquired Illness or Injury  Outcome: Progressing  Goal: Optimal Comfort and Wellbeing  Outcome: Progressing  Goal: Readiness for Transition of Care  Outcome: Progressing  Goal: Rounds/Family Conference  Outcome: Progressing     Problem: Heart Failure Comorbidity  Goal: Maintenance of Heart Failure Symptom Control  Outcome: Progressing     Problem: Infection (Pneumonia)  Goal: Resolution of Infection Signs/Symptoms  Outcome: Progressing

## 2017-06-09 NOTE — Unmapped (Signed)
Emergency Department Provider Note        ED Clinical Impression     Final diagnoses:   Pneumonia of right middle lobe due to infectious organism (CMS-HCC) (Primary)       ED Assessment/Plan     Patient is a 62 yo F with a PMH of HTN, chemotherapy induced cardiomyopathy now resolved (EF 55% 10/2016) and AML now in remission s/p stem cell transplant presenting with 4 days of cough, congestion, fevers and malaise. She is afebrile and normotensive but satting 92% on RA. She has diminished lung sounds on the R concerning for underlying PNA. Will obtain blood cultures, CBC, CMP, Mg, flu swab & CXR. Will also give 1L NS and reassess volume status, she will likely need another bolus.     Labs significant for WBC 6.3, CMP shows hyponatremia of 132 (134 when corrected for BG of 199) but otherwise WNL. CXR with bibasilar opacities R > L. Will treat her CAP with IV Levofloxacin. Patient is currently sating 95% on 2L and remains tachycardic. Will re-bolus with 1L LR and plan for admission.     BMT paged and they will discuss which service to admit to as she is >1 year out from her transplant but they will come and evaluate the patient. Flu and Mg pending.     History     Chief Complaint   Patient presents with   ??? Fever Between 64 Weeks and 64 Years Old     HPI    Christine [name] is a 62 yo F with a PMH of HTN, chemotherapy induced cardiomyopathy and AML in remission for 15 months s/p stem cell transplant who presents to the ED with complaints of 4 days of cough, congestion, fevers to 103 and malaise. She tells me that she was in her previous state of health until 4 days ago when she developed a non productive cough with generalized malaise. She reports that she subsequently developed fevers with a Tmax to 103. She has been taking Tylenol and Nyquil without improvement. Patient reports a sick contact in her husband who developed similar symptoms on the same day. Patient was evaluated by her PCP and tested negative for strep throat but was recommended to present to the ED due to her complicated PMH. Patient denies any nausea, vomiting, dysuria, diarrhea or constipation. She does report a decreased appetite.     Past Medical History:   Diagnosis Date   ??? 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 AML (acute myelogenous leukemia) (CMS-HCC)    ??? 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 DVT (deep venous thrombosis) (CMS-HCC)    ??? Hypertension    ??? Osteoporosis     Stopped medications due to interactions with chemo drugs       Past Surgical History:   Procedure Laterality Date   ??? IR INSERT PORT AGE GREATER THAN 5 YRS  12/19/2015    IR INSERT PORT AGE GREATER THAN 5 YRS 12/19/2015 Ammie Dalton, MD IMG VIR MM MMNT   ??? PR UPPER GI ENDOSCOPY,BIOPSY N/A 05/05/2016    Procedure: UGI ENDOSCOPY; WITH BIOPSY, SINGLE OR MULTIPLE;  Surgeon: Jolyn Lent, MD;  Location: [facility];  Service: Gastroenterology   ??? SHOULDER SURGERY  Family History   Problem Relation Age of Onset   ??? Breast cancer Mother    ??? Cancer Paternal Grandfather         oral   ??? Cancer Maternal Aunt         unknown primary   ??? Asthma Neg Hx    ??? Allergic rhinitis Neg Hx    ??? Eczema Neg Hx        Social History     Socioeconomic History   ??? Marital status: Married     Spouse name: None   ??? Number of children: None   ??? Years of education: None   ??? Highest education level: None   Occupational History   ??? None   Social Needs   ??? Financial resource strain: None   ??? Food insecurity:     Worry: None     Inability: None   ??? Transportation needs:     Medical: None     Non-medical: None   Tobacco Use   ??? Smoking status: Never Smoker   ??? Smokeless tobacco: Never Used   Substance and Sexual Activity   ??? Alcohol use: No   ??? Drug use: No   ??? Sexual activity: None   Lifestyle   ??? Physical activity:     Days per week: None     Minutes per session: None   ??? Stress: None   Relationships   ??? Social connections:     Talks on phone: None     Gets together: None     Attends religious service: None     Active member of club or organization: None     Attends meetings of clubs or organizations: None     Relationship status: None   Other Topics Concern   ??? 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      Highest level of education: high school       Review of Systems   Constitutional: Positive for activity change, appetite change, chills and fever.   HENT: Positive for congestion and rhinorrhea. Negative for sinus pain and sore throat.    Eyes: Negative for visual disturbance.   Respiratory: Positive for cough. Negative for wheezing.    Cardiovascular: Negative for chest pain and leg swelling.   Gastrointestinal: Negative for constipation, diarrhea, nausea and vomiting.   Genitourinary: Negative for decreased urine volume, dysuria and hematuria.   Skin: Negative for rash.   Neurological: Positive for headaches. Negative for dizziness, syncope and light-headedness.       Physical Exam     BP 119/78  - Pulse 122  - Temp 37.7 ??C (99.9 ??F) (Oral)  - Resp 21  - LMP  (LMP Unknown)  - SpO2 96%     Physical Exam   Constitutional: She is oriented to person, place, and time.   Well developed, tired but non toxic appearing female seen lying in bed in NAD   HENT:   Head: Normocephalic and atraumatic.   Right Ear: External ear normal.   Left Ear: External ear normal.   Nose: Nose normal.   Mouth/Throat: Oropharynx is clear and moist. No oropharyngeal exudate.   Eyes: Pupils are equal, round, and  reactive to light. Conjunctivae and EOM are normal. No scleral icterus.   Neck: Normal range of motion. Neck supple.   Cardiovascular: Regular rhythm, normal heart sounds and intact distal pulses. Tachycardia present.   No murmur heard.  Pulmonary/Chest:   Normal work of breathing on 2L. Diminished breath sounds on RML & RLL. No wheezes or crackles.   Abdominal: Soft. Bowel sounds are normal. There is no tenderness.   Musculoskeletal: Normal range of motion. She exhibits no edema.   Lymphadenopathy:     She has no cervical adenopathy.   Neurological: She is alert and oriented to person, place, and time.   Skin: Skin is warm and dry. Capillary refill takes 2 to 3 seconds.   Psychiatric: She has a normal mood and affect. Her behavior is normal. Judgment and thought content normal.   Vitals reviewed.         Gala Lewandowsky, MD  Resident  06/08/17 2103

## 2017-06-09 NOTE — Unmapped (Signed)
Pt came in from the ED last night for fever and pneumonia. Pt VSS and was afebrile overnight. Pt was given prn meds for coughing. Pt had no significant events overnight. Will continue to monitor.       Problem: Adult Inpatient Plan of Care  Goal: Plan of Care Review  Outcome: Progressing  Goal: Patient-Specific Goal (Individualization)  Outcome: Progressing  Goal: Absence of Hospital-Acquired Illness or Injury  Outcome: Progressing  Goal: Optimal Comfort and Wellbeing  Outcome: Progressing  Goal: Readiness for Transition of Care  Outcome: Progressing  Goal: Rounds/Family Conference  Outcome: Progressing

## 2017-06-09 NOTE — Unmapped (Signed)
Patient rounding complete, call bell in reach, bed locked and in lowest position, patient belongings at bedside and within reach of patient.  Patient updated on plan of care.

## 2017-06-09 NOTE — Unmapped (Signed)
Care Management  Initial Transition Planning Assessment              General  Care Manager assessed the patient by : In person interview with patient  Orientation Level: Oriented X4    Contact/Decision Maker        Extended Emergency Contact Information  Primary Emergency Contact: Zuch,Paul   [place]  Home Phone: [phone]  Relation: Spouse    Legal Next of Kin / Guardian / POA / Advance Directives       Advance Directive (Medical Treatment)  Does patient have an advance directive covering medical treatment?: Patient has advance directive covering medical treatment, copy not in chart.(Pt confirmed husband is HCPOA)  Information provided on advance directive:: No  Patient requests assistance:: No         Patient Information  Lives with: Spouse/significant other    Type of Residence: Private residence        Location/Detail: [place], [place]     Type of Residence: Mailing Address:  [address]  [place] [address]  Contacts:    Patient Phone Number: [phone]        Medical Provider(s): Allegra Grana, NP  Reason for Admission: Admitting Diagnosis:  Pneumonia of right middle lobe due to infectious organism (CMS-HCC) [J18.1]  Past Medical History:   has a past medical history of Allogeneic stem cell transplant (RAF-HCC) (02/24/2016), AML (acute myelogenous leukemia) (CMS-HCC), Cardiomyopathy (CMS-HCC), DVT (deep venous thrombosis) (CMS-HCC), Hypertension, and Osteoporosis.  Past Surgical History:   has a past surgical history that includes Shoulder surgery; IR Insert Port Age Greater Than 5 Years (12/19/2015); and pr upper gi endoscopy,biopsy (N/A, 05/05/2016).   Previous admit date: 04/13/2016    Primary Insurance- Payor: BCBS / Plan: BCBS BLUE VALUE WITH [facility] / Product Type: *No Product type* /   Secondary Insurance ??? None  Prescription Coverage ??? [employer]  Preferred Pharmacy -  [facility] - [place], [place] - [address]  [id number] - [place], [place] - [address]  [facility] - [place], [place] - [address]    Transportation home: Private vehicle  Level of function prior to admission: Independent          Support Systems: Spouse    Responsibilities/Dependents at home?: No    Home Care services in place prior to admission?: No                  Equipment Currently Used at Home: none       Currently receiving outpatient dialysis?: No       Financial Information       Need for financial assistance?: No         Discharge Needs Assessment  Concerns to be Addressed: denies needs/concerns at this time    Clinical Risk Factors: Principal Diagnosis: Cancer, Stroke, COPD, Heart Failure, AMI, Pneumonia, Joint Replacment    Barriers to taking medications: No    Prior overnight hospital stay or ED visit in last 90 days: No    Readmission Within the Last 30 Days: no previous admission in last 30 days         Anticipated Changes Related to Illness: none    Equipment Needed After Discharge: none    Discharge Facility/Level of Care Needs: (home)    Readmission  Risk of Unplanned Readmission Score: UNPLANNED READMISSION SCORE: 9%  Readmitted Within the Last 30 Days?   Patient  at risk for readmission?: Yes    Discharge Plan  Screen findings are: Care Manager reviewed the plan of the patient's care with the Multidisciplinary Team. No discharge planning needs identified at this time. Care Manager will continue to manage plan and monitor patient's progress with the team.    Expected Discharge Date: 06/13/17      Patient and/or family were provided with choice of facilities / services that are available and appropriate to meet post hospital care needs?: N/A       Initial Assessment complete?: Yes

## 2017-06-09 NOTE — Unmapped (Signed)
RT is running ABG for pt

## 2017-06-09 NOTE — Unmapped (Signed)
Patient transported to X-ray  Transported by Radiology  How tranported Stretcher  Cardiac Monitor yes

## 2017-06-09 NOTE — Unmapped (Signed)
Admitting has been paged for more orders at this time Patient rounding complete, call bell in reach, bed locked and in lowest position, patient belongings at bedside and within reach of patient.  Patient updated on plan of care.

## 2017-06-09 NOTE — Unmapped (Signed)
Report given to [place], Charity fundraiser. Patient care transferred at this time.

## 2017-06-09 NOTE — Unmapped (Signed)
Fever for 4 days, Tmax 103, coughing and congestion  Hx leukemia in remission for 15 months  Tachy in triage, spO2 92%

## 2017-06-10 LAB — BASIC METABOLIC PANEL
ANION GAP: 9 mmol/L (ref 9–15)
BUN / CREAT RATIO: 18
CALCIUM: 8.8 mg/dL (ref 8.5–10.2)
CHLORIDE: 106 mmol/L (ref 98–107)
CO2: 25 mmol/L (ref 22.0–30.0)
CREATININE: 0.62 mg/dL (ref 0.60–1.00)
EGFR MDRD AF AMER: >=60 mL/min/{1.73_m2} (ref >=60)
EGFR MDRD NON AF AMER: >=60 mL/min/{1.73_m2} (ref >=60)
GLUCOSE RANDOM: 84 mg/dL (ref 65–179)
POTASSIUM: 3.9 mmol/L (ref 3.5–5.0)
SODIUM: 140 mmol/L (ref 135–145)

## 2017-06-10 LAB — CBC W/ AUTO DIFF
BASOPHILS ABSOLUTE COUNT: 0 10*9/L (ref 0.0–0.1)
BASOPHILS RELATIVE PERCENT: 0.2 %
EOSINOPHILS ABSOLUTE COUNT: 0 10*9/L (ref 0.0–0.4)
EOSINOPHILS RELATIVE PERCENT: 0.6 %
HEMATOCRIT: 31.6 % — ABNORMAL LOW (ref 36.0–46.0)
HEMOGLOBIN: 10.4 g/dL — ABNORMAL LOW (ref 12.0–16.0)
LYMPHOCYTES ABSOLUTE COUNT: 0.8 10*9/L — ABNORMAL LOW (ref 1.5–5.0)
LYMPHOCYTES RELATIVE PERCENT: 14.8 %
MEAN CORPUSCULAR HEMOGLOBIN CONC: 32.9 g/dL (ref 31.0–37.0)
MEAN CORPUSCULAR HEMOGLOBIN: 32.6 pg (ref 26.0–34.0)
MEAN CORPUSCULAR VOLUME: 99.1 fL (ref 80.0–100.0)
MEAN PLATELET VOLUME: 7.2 fL (ref 7.0–10.0)
MONOCYTES ABSOLUTE COUNT: 0.3 10*9/L (ref 0.2–0.8)
MONOCYTES RELATIVE PERCENT: 5.8 %
NEUTROPHILS RELATIVE PERCENT: 76.2 %
PLATELET COUNT: 241 10*9/L (ref 150–440)
RED BLOOD CELL COUNT: 3.19 10*12/L — ABNORMAL LOW (ref 4.00–5.20)
RED CELL DISTRIBUTION WIDTH: 13.2 % (ref 12.0–15.0)
WBC ADJUSTED: 5.5 10*9/L (ref 4.5–11.0)

## 2017-06-10 LAB — GAMMAGLOBULIN; IGG: IgG:MCnc:Pt:Ser/Plas:Qn:: 660

## 2017-06-10 LAB — CHLORIDE: Chloride:SCnc:Pt:Ser/Plas:Qn:: 106

## 2017-06-10 LAB — LYMPHOCYTES RELATIVE PERCENT: Lab: 14.8

## 2017-06-10 MED ORDER — LEVOFLOXACIN 750 MG TABLET: 750 mg | tablet | Freq: Every day | 0 refills | 0 days | Status: AC

## 2017-06-10 MED ORDER — LEVOFLOXACIN 750 MG TABLET: 750 mg | each | 0 refills | 0 days

## 2017-06-10 MED ORDER — LEVOFLOXACIN 750 MG TABLET
ORAL_TABLET | Freq: Every day | ORAL | 0 refills | 0.00000 days | Status: CP
Start: 2017-06-10 — End: 2017-07-12

## 2017-06-10 MED FILL — LEVOFLOXACIN/750MG/TABS: LEVOFLOXACIN/750MG/TABS | 6 days supply | Qty: 6 | Fill #0

## 2017-06-10 NOTE — Unmapped (Signed)
Neuro/Pain:  A&O; no pain  CV/Resp/PV:  VSS; RA  ID / BMTU Tx Plan:  ABX changed to PO levaquin for D/C.  +.  Droplet/Contact precautions maintained.  FENGI/GU:  WNL  Musc/Falls:  WNL  Access:  PIV positional, but patent and benign; D/C prior to ABX  Skin/Wounds:  WNL  Social:  Spouse to pick up pt for D/C  Patient Education:  See pt education.      Problem: Adult Inpatient Plan of Care  Goal: Plan of Care Review  Outcome: Discharged to Home  Goal: Patient-Specific Goal (Individualization)  Outcome: Discharged to Home  Goal: Absence of Hospital-Acquired Illness or Injury  Outcome: Discharged to Home  Goal: Optimal Comfort and Wellbeing  Outcome: Discharged to Home  Goal: Readiness for Transition of Care  Outcome: Discharged to Home  Goal: Rounds/Family Conference  Outcome: Discharged to Home     Problem: Heart Failure Comorbidity  Goal: Maintenance of Heart Failure Symptom Control  Outcome: Discharged to Home     Problem: Infection (Pneumonia)  Goal: Resolution of Infection Signs/Symptoms  Outcome: Discharged to Home

## 2017-06-10 NOTE — Unmapped (Signed)
VSS and afebrile. Continuous O2 monitoring in place. Calls from telemetry over night alerting of low O2 saturation, but likely d/t improper placement of oxygen probe on finger. Pt asymptomatic and satting in 90s upon checking with O2 monitor on unit. Pt continues on scheduled cefepime with premed  with benadryl. No allergy reactions present. Robitusson given once for cough. No complaints n/v/d or pain. Will ctm.    Problem: Adult Inpatient Plan of Care  Goal: Plan of Care Review  Outcome: Progressing  Goal: Patient-Specific Goal (Individualization)  Outcome: Progressing  Goal: Absence of Hospital-Acquired Illness or Injury  Outcome: Progressing  Goal: Optimal Comfort and Wellbeing  Outcome: Progressing  Goal: Readiness for Transition of Care  Outcome: Progressing  Goal: Rounds/Family Conference  Outcome: Progressing     Problem: Heart Failure Comorbidity  Goal: Maintenance of Heart Failure Symptom Control  Outcome: Progressing     Problem: Infection (Pneumonia)  Goal: Resolution of Infection Signs/Symptoms  Outcome: Progressing

## 2017-06-10 NOTE — Unmapped (Signed)
Physician Discharge Summary    Identifying Information:   Christine [name]  1955-05-05  [id number]    Admit date: 06/08/2017    Discharge date: 06/10/2017     Discharge Service: Bone Marrow (MDT)    Discharge Attending Physician: Artelia Laroche, MD    Discharge to: Home    Discharge Diagnoses:  Principal Problem:    Pneumonia of both lower lobes due to infectious organism (CMS-HCC)  Active Problems:    Acute myeloid leukemia (CMS-HCC)    Hospital Course:   Christine [name] is a??62 y.o.??female??with a diagnosis of AML ??(FLT3-ITD and IDH2+) s/p MRD allogeneic SCT with Bu/Flu conditioning on CTN 1301 trial randomized to transplant. 1??yr 3 mo??post transplant. Admitted for cough, fever, body aches and hypoxia, with CT findings of tree-in-bud and reticulonodular opacities in the RUL, RML and bilateral lower lobes. Also with medial bibasilar peribronchial consolidative opacities. Respiratory viral panel returned positive for parainfluenza. Received cefepime during admission, transitioned to levofloxacin on discharge with plan to complete a 7 day course empirically for suspected bacterial superinfection.    Christine [name] will follow up with Dr. Oswald Hillock as scheduled on 6/11. She has been given return precautions in case of worsening infection.    For additional details regarding transplant course, please see latest clinic note.    Procedures:  No admission procedures for hospital encounter.  _____________________________________________________________________________  Discharge Day Services:  BP 115/74  - Pulse 84  - Temp 36.9 ??C (98.5 ??F) (Oral)  - Resp 16  - Wt 64.2 kg (141 lb 8 oz)  - LMP  (LMP Unknown)  - SpO2 95% Comment: started at100 - BMI 24.31 kg/m??   Pt seen on the day of discharge and determined appropriate for discharge.    Condition at Discharge: stable    Length of Discharge: I spent less than 30 mins in the discharge of this patient. _____________________________________________________________________________  Discharge Medications:     Your Medication List      START taking these medications    levoFLOXacin 750 MG tablet  Commonly known as:  LEVAQUIN  Take 1 tablet (750 mg total) by mouth daily. for 6 days        CONTINUE taking these medications    calcium carbonate-vitamin D3 600 mg (1,500 mg)-800 unit Chew  Chew 1 tablet 2 (two) times a day with meals.     valACYclovir 500 MG tablet  Commonly known as:  VALTREX  TAKE 1 TABLET BY MOUTH ONCE DAILY          ____________________________________________________________________________  Pending Test Results (if blank, then none):   Order Current Status    VRE Screen In process    Blood Culture #1 Preliminary result    Blood Culture #2 Preliminary result        Most Recent Labs:  Microbiology Results (last day)     Procedure Component Value Date/Time Date/Time    Urine culture [[id number]]  (Normal) Collected:  06/08/17 2244    Lab Status:  Final result Specimen:  Urine from Clean Catch Updated:  06/10/17 0844     Urine Culture, Comprehensive NO GROWTH    Narrative:       Specimen Source: Clean Catch    Blood Culture #1 [[id number]] Collected:  06/08/17 1949    Lab Status:  Preliminary result Specimen:  Blood from 1 Peripheral Draw Updated:  06/09/17 2015     Blood Culture, Routine No Growth at 24 hours    Blood Culture #2 [[id number]] Collected:  06/08/17 1847    Lab Status:  Preliminary result Specimen:  Blood from 1 Peripheral Draw Updated:  06/09/17 2015     Blood Culture, Routine No Growth at 24 hours    Respiratory Pathogen Panel [[id number]]  (Abnormal) Collected:  06/09/17 1140    Lab Status:  Final result Specimen:  Nasopharyngeal Swab Updated:  06/09/17 1438     Adenovirus Not Detected     Coronavirus HKU1 Not Detected     Coronavirus NL63 Not Detected     Coronavirus 229E Not Detected     Coronavirus OC43 Not Detected     Metapneumovirus Not Detected     Rhinovirus/Enterovirus Not Detected     Influenza A Not Detected     Influenza A/H1 Not Detected     Influenza A/H3 Not Detected     Influenza A/H1-2009 Not Detected     Influenza B Not Detected     Parainfluenza 1 Not Detected     Parainfluenza 2 Not Detected     Parainfluenza 3 Detected     Parainfluenza 4 Not Detected     RSV Not Detected     Bordetella pertussis Not Detected     Comment:   If B. pertussis/parapertussis infection is suspected, the Bordetella pertussis/parapertussis Qualitative PCR test should be ordered.        Bordetella parapertussis Not Detected     Chlamydophila (Chlamydia) pneumoniae Not Detected     Mycoplasma pneumoniae Not Detected    Narrative:       Specimen Source: Nasopharyngeal Swab  -  This result was obtained using the FDA-cleared FilmArray Respiratory Panel 2. Performance characteristics have been established and verified by the Clinical Molecular Microbiology Laboratory, [facility]. This assay does not distinguish between rhinovirus and enterovirus. Lower respiratory specimens will not be tested for Bordetella pertusiss/parapertussis. For nasopharyngeal swabs, cross-reactivity may occur between B. pertussis and non-pertussis Bordetella species. All positive B. pertussis results will be automatically confirmed using our in-house PCR assay.        Lab Results   Component Value Date    WBC 5.5 06/10/2017    HGB 10.4 (L) 06/10/2017    HCT 31.6 (L) 06/10/2017    PLT 241 06/10/2017     Lab Results   Component Value Date    NA 140 06/10/2017    K 3.9 06/10/2017    CL 106 06/10/2017    CO2 25.0 06/10/2017    BUN 11 06/10/2017    CREATININE 0.62 06/10/2017    CALCIUM 8.8 06/10/2017    MG 1.8 06/08/2017    PHOS 3.0 04/15/2016     Lab Results   Component Value Date    ALKPHOS 117 06/08/2017    BILITOT 0.7 06/08/2017    BILIDIR <0.10 04/15/2016    PROT 7.0 06/08/2017    ALBUMIN 4.3 06/08/2017    ALT 48 06/08/2017    AST 52 (H) 06/08/2017    GGT 115 (H) 08/10/2016       Lab Results   Component Value Date    PT 14.3 (H) 04/15/2016    INR 1.22 04/15/2016    APTT 24.3 (L) 04/15/2016     Hospital Radiology:  Ct Chest Wo Contrast    Result Date: 06/09/2017  EXAM: CT CHEST WO CONTRAST DATE: 06/09/2017 2:50 AM ACCESSION: [id number] UN DICTATED: 06/09/2017 4:02 AM INTERPRETATION LOCATION: [facility] CLINICAL INDICATION: 62 years old Female with Pneumonia-  COMPARISON: CXR 06/08/2017 and earlier. CT chest 10/11/2015. TECHNIQUE: A spiral CT scan was  obtained without IV contrast from the thoracic inlet through the hemidiaphragms. Images were reconstructed in the axial plane.  Coronal and sagittal reformatted images of the chest were also provided for further evaluation of the lung parenchyma. FINDINGS: AIRWAYS, LUNGS, PLEURA: Clear central tracheobronchial tree. Mild biapical scarring. Posterior right upper lobe, right middle lobe, and bilateral lower lobe tree-in-bud reticulonodular opacities. Additionally, there are medial bibasilar peribronchial consolidative opacities. No pleural effusion. MEDIASTINUM: Normal heart size. Trace pericardial effusion. Normal caliber thoracic aorta.  1.0 cm precarinal node (4:35) IMAGED ABDOMEN: Partially imaged large hepatic hypodensity, similar to prior. Additional subcentimeter hepatic hypodensities, grossly stable from prior. Stable 1.3 cm hypoattenuating lesion along the spleen (4:82). SOFT TISSUES: Unremarkable. BONES: Prominent Schmorl node in the L1 vertebral body. No acute osseous abnormality. Partially imaged right humeral head hardware.     --Tree-in-bud and reticulonodular opacities in the right upper, right middle, and bilateral lower lobes, concerning for infection. --Medial bibasilar peribronchial consolidative opacities, which may reflect infection and/or atelectasis. Addendum by Dr. Burnard Leigh at 0 7:41 AM on 06/09/2017: I agree that pulmonary parenchymal opacities are concerning for endobronchial infection but given presence of small hiatal hernia aspiration is also possible.    Xr Chest 2 Views    Result Date: 06/08/2017  EXAM: CHEST TWO VIEW DATE: 06/08/2017 6:46 PM ACCESSION: [id number] UN DICTATED: 06/08/2017 7:00 PM INTERPRETATION LOCATION: [facility] CLINICAL INDICATION: 62 years old Female with COUGH-  COMPARISON: 04/13/2016 TECHNIQUE:PA and lateral views of the chest. FINDINGS: Interval removal of left IJ Hickman catheter and right IJ Port-A-Cath. The lungs are well-inflated. Faint opacities in the lung bases. No sizable pleural effusions. No pneumothorax. Stable cardiomediastinal silhouette. Partially imaged fixation hardware in the right proximal humerus.     --Bibasilar opacities which could reflect atelectasis or an infectious process.    _____________________________________________________________________________  Discharge Instructions:         Follow Up instructions and Outpatient Referrals     Discharge instructions      Take levofloxacin for 6 days, starting tomorrow (we will give you a dose here before you go).    Call right away or come in if you begin to feel worse, have fever, or have worsening shortness of breath or cough.    Symptom management: Call the clinic (or inpatient unit if after hours) if you have nausea, vomiting, diarrhea, or pain that is not controlled with your medications. Also call if you develop a new rash.    Please call the clinic (or inpatient unit if after hours) if you have any questions or concerns.    --------  For appointments & questions Monday through Friday 8 AM- 4:30 PM   please call [phone] or Toll free [phone].    On Nights, Weekends and Holidays  Call [phone] and ask for the BMT Physician on call.    [facility]  [address]  [place], [place] [address]  [email or web address]               Appointments which have been scheduled for you    Jun 21, 2017  9:45 AM EDT  (Arrive by 9:15 AM)  NURSE LAB [place] with Carnella Guadalajara  [facility] BMT [facility] REGION) [address]  [place] [address] [phone]      Jun 21, 2017 10:15 AM EDT  (Arrive by 9:45 AM)  RETURN FOLLOW UP [place] with Artelia Laroche, MD  [facility] BMT  ([place]) [address]  [place] [address]  [phone]      Jun 21, 2017  2:30 PM EDT  (Arrive by 2:00 PM)  BMT LEVEL 150 with [employer] CHAIR 07  [facility] REGION) [address]  [place] [address]  [phone]

## 2017-06-17 MED FILL — VALACYCLOVIR/500MG/TAB: VALACYCLOVIR/500MG/TAB | 30 days supply | Qty: 30 | Fill #4

## 2017-06-21 ENCOUNTER — Ambulatory Visit: Admit: 2017-06-21 | Discharge: 2017-06-21 | Payer: PRIVATE HEALTH INSURANCE

## 2017-06-21 ENCOUNTER — Other Ambulatory Visit: Admit: 2017-06-21 | Discharge: 2017-06-21 | Payer: PRIVATE HEALTH INSURANCE

## 2017-06-21 DIAGNOSIS — J122 Parainfluenza virus pneumonia: Secondary | ICD-10-CM

## 2017-06-21 DIAGNOSIS — Z9484 Stem cells transplant status: Secondary | ICD-10-CM

## 2017-06-21 DIAGNOSIS — C9201 Acute myeloblastic leukemia, in remission: Secondary | ICD-10-CM

## 2017-06-21 DIAGNOSIS — Z9481 Bone marrow transplant status: Secondary | ICD-10-CM

## 2017-06-21 DIAGNOSIS — D899 Disorder involving the immune mechanism, unspecified: Principal | ICD-10-CM

## 2017-06-21 LAB — CBC W/ AUTO DIFF
BASOPHILS ABSOLUTE COUNT: 0 10*9/L (ref 0.0–0.1)
BASOPHILS RELATIVE PERCENT: 0.6 %
EOSINOPHILS RELATIVE PERCENT: 1.8 %
HEMATOCRIT: 38.5 % (ref 36.0–46.0)
HEMOGLOBIN: 12.6 g/dL (ref 12.0–16.0)
LARGE UNSTAINED CELLS: 3 % (ref 0–4)
LYMPHOCYTES RELATIVE PERCENT: 19.2 %
MEAN CORPUSCULAR HEMOGLOBIN CONC: 32.8 g/dL (ref 31.0–37.0)
MEAN CORPUSCULAR HEMOGLOBIN: 33 pg (ref 26.0–34.0)
MEAN CORPUSCULAR VOLUME: 100.3 fL — ABNORMAL HIGH (ref 80.0–100.0)
MEAN PLATELET VOLUME: 7.2 fL (ref 7.0–10.0)
MONOCYTES ABSOLUTE COUNT: 0.3 10*9/L (ref 0.2–0.8)
MONOCYTES RELATIVE PERCENT: 7.1 %
NEUTROPHILS ABSOLUTE COUNT: 2.6 10*9/L (ref 2.0–7.5)
NEUTROPHILS RELATIVE PERCENT: 68 %
PLATELET COUNT: 352 10*9/L (ref 150–440)
RED BLOOD CELL COUNT: 3.84 10*12/L — ABNORMAL LOW (ref 4.00–5.20)
RED CELL DISTRIBUTION WIDTH: 13.2 % (ref 12.0–15.0)
WBC ADJUSTED: 3.8 10*9/L — ABNORMAL LOW (ref 4.5–11.0)

## 2017-06-21 LAB — COMPREHENSIVE METABOLIC PANEL
ALBUMIN: 4.2 g/dL (ref 3.5–5.0)
ALKALINE PHOSPHATASE: 85 U/L (ref 38–126)
ALT (SGPT): 28 U/L (ref 15–48)
ANION GAP: 8 mmol/L — ABNORMAL LOW (ref 9–15)
AST (SGOT): 26 U/L (ref 14–38)
BILIRUBIN TOTAL: 0.4 mg/dL (ref 0.0–1.2)
BLOOD UREA NITROGEN: 13 mg/dL (ref 7–21)
CALCIUM: 9.9 mg/dL (ref 8.5–10.2)
CHLORIDE: 102 mmol/L (ref 98–107)
CO2: 31 mmol/L — ABNORMAL HIGH (ref 22.0–30.0)
EGFR MDRD AF AMER: >=60 mL/min/{1.73_m2} (ref >=60)
EGFR MDRD NON AF AMER: >=60 mL/min/{1.73_m2} (ref >=60)
GLUCOSE RANDOM: 90 mg/dL (ref 65–179)
POTASSIUM: 4.9 mmol/L (ref 3.5–5.0)
PROTEIN TOTAL: 7 g/dL (ref 6.5–8.3)

## 2017-06-21 LAB — LYMPH MARKER LIMITED,FLOW
ABSOLUTE CD8 CNT: 171 {cells}/uL — ABNORMAL LOW (ref 180–1520)
CD3% (T CELLS)": 47 % — ABNORMAL LOW (ref 61–86)
CD4% (T HELPER)": 27 % — ABNORMAL LOW (ref 34–58)
CD4:CD8 RATIO: 1.3 (ref 0.9–4.8)
CD8% T SUPPRESR": 21 % (ref 12–38)

## 2017-06-21 LAB — LARGE UNSTAINED CELLS: Lab: 3

## 2017-06-21 LAB — ABSOLUTE CD8 CNT: Lab: 171 — ABNORMAL LOW

## 2017-06-21 LAB — EGFR MDRD NON AF AMER: Glomerular filtration rate/1.73 sq M.predicted.non black:ArVRat:Pt:Ser/Plas/Bld:Qn:Creatinine-based formula (MDRD): >=60

## 2017-06-21 NOTE — Unmapped (Signed)
BMT Clinic Follow-up Note    Patient Name: Christine [name]  Medical Record Number:  [id number]  Encounter Date: 06/21/2017     BMT Attending MD:  Lanae Boast, MD.     HPI: Momoko Slezak Grizzle is a 62 y.o. 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 1 yr 3 mo post transplant.    Tacrolimus stop date 06/15/16.      Interval History:  Mrs. Estelle was hospitalized 5/29-5/31 for parainfluenza pneumonia. She improved with IV antibiotics and supportive care. She was discharged in good condition to complete 7 more days of Levaquin. Today presents for follow up.   She is feeling quite well. Has residual fatigue and weakness and residual sore throat. But both continue to improve. Mild cough. She denies fever, chills. Denies SOB. Denies CP.    She denies nausea, vomiting, or diarrhea. She has no new rashes. She denies bleeding, bruising, or infectious symptoms. She is eating drinking well.     PMH, PSH, and PSocH: Reviewed and there are no changes.     Allergies:  Dapsone; Penicillins; Rasburicase; Sulfasalazine; Vancomycin; Vancomycin analogues; Adhesive; Amoxicillin; Cefepime; Morphine; and Tapentadol   Medications: Reviewed and updated in EPIC.     Review of Systems:  A comprehensive ROS is negative except for as mentioned above in HPI.     Objective:  VS:BP 115/77  - Pulse 84  - Temp 36.8 ??C (98.2 ??F) (Oral)  - Resp 16  - Wt 62.3 kg (137 lb 4.8 oz)  - LMP  (LMP Unknown)  - SpO2 96%  - BMI 23.59 kg/m??     KPS: 80%, Normal activity with effort; some signs or symptoms of disease (ECOG equivalent 1)    Acute Graft versus Host Disease Assessment: 06/21/2017  Organ stage: 0  Skin stage 0, No Rash, none, 0 % BSA;   Upper GI stage 0, no nausea and no vomiting;   Lower GI stage 0, No Diarrhea;   Hepatic stage 0, Bilirubin normal - 2mg /dL    Overall grade (Current) None     Physical Exam:   General appearance: Ms. Lewing looks good. Although slightly pale and more fatigued than usual.    Head: Alopecia, wears wig. Normal, non-tender sinuses.  Eyes: EOMs intact, conjunctiva and sclera clear.   Throat slightly erythematous.   Neck: Supple, no palpable LAD  Lungs: CTAB. No wheezes, crackles, or rhonchi.   Heart: RRR. Normal s1/s2. No murmur, rub, or gallop.  Abdomen: Soft, non-tender, normoactive BS, no organomegaly.   Extremities: No edema. Pulses palpable in all extremities.   MSK: No joint deformity, tenderness, or swelling. FROM  Skin: Warm, dry, and intact. No rashes.   Line: R PAC accessed. Non-tender and no notable erythema.     Test Results:  Lab Results   Component Value Date    WBC 3.8 (L) 06/21/2017    HGB 12.6 06/21/2017    HCT 38.5 06/21/2017    PLT 352 06/21/2017       Lab Results   Component Value Date    NA 141 06/21/2017    K 4.9 06/21/2017    CL 102 06/21/2017    CO2 31.0 (H) 06/21/2017    BUN 13 06/21/2017    CREATININE 0.82 06/21/2017    GLU 90 06/21/2017    CALCIUM 9.9 06/21/2017    MG 1.8 06/08/2017    PHOS 3.0 04/15/2016  Lab Results   Component Value Date    BILITOT 0.4 06/21/2017    BILIDIR <0.10 04/15/2016    PROT 7.0 06/21/2017    ALBUMIN 4.2 06/21/2017    ALT 28 06/21/2017    AST 26 06/21/2017    ALKPHOS 85 06/21/2017    GGT 115 (H) 08/10/2016     ASSESSMENT / IMPRESSION:  **AML:??(FLT3-ITD, +11, and IDH2)??in CR prior to transplant.   06/22/16: BMBx:   - ??Normocellular bone marrow (40%) with trilineage hematopoiesis and 3% blasts.  - ??Routine cytogenetics: donor karyotype; FISH (trisomy 11) results are normal.  - ??Flow cytometric MRD results reveal no evidence of aberrant myeloid antigen expression or abnormal myeloblasts.  - ??DNA Chimerism see below.  - Considered maintenance with FLT3 inhibitor??(sorafenib). Was reluctant to start around D+45-60 due to pancytopenia and changing chimerism. Decided against it. Now she is 10 months after transplant. Discussed maintenance again - decided against it.   ??  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 ??  Conditioning:??  ????????????????????????Busulfan PK calculated from test dose 80 mg/hr daily and Fludarabine 40 mg/m2 daily ??day -5 thru day -2  ????????????????????????Day 0 -??02/24/16  Cell dose:??3.24 x10^8 TNC (Marrow)  Chimerisms/Marrow:  03/31/16: Day 30 marrow normocellular w/1% blasts. FISH and cytos are normal. MRD negative.   BM Chimerisms show 75% donor CD3 and >95% donor in unfractionated compartment.   04/22/16: PB Chimerisms show 71% donor CD3 and >95% donor unfractionated. 05/05/16: BM Chimerisms show 65% donor CD3 and >95% donor unfractionated.   05/18/16: PB Chimerisms show 74% donor CD3 and 95% donor unfractionated.   06/01/16: PB Chimerisms show 69% donor CD3 and >95% donor unfractionated.  06/22/16: PB Chimerisms show 80% donor CD3 and >95% donor unfractionated  06/22/16: BM Chimerisms show 79% donor CD3 and >95% donor unfractionated.   07/06/16: PB Chimerisms show 77% donor CD3 and >95% donor unfractionated.  07/13/16: Off tac x 4 weeks.??Chimerism check next visit.  If no full chimerism at 8-10 weeks after discontinuation of tac will consider DLI. We do not have any donor cells stored. If we need to recollect for DLI, will consider mobilizing the donor and collecting CD3+ cells for DLI as well as CD34+ cells for another transplant.   07/26/16: Off tac almost 6 weeks. PB Chimerisms show 74% donor in CD3 and >95% donor unfractionated.   08/10/16: Off tac 8 weeks. Repeat DNA shows slight increase in CD3 to 79%. Plan after discussion with Dr. Oswald Hillock to re-collect sister for more donor cells for DLI. However, today with??elevated LFTs, this is new, will need to follow these and make sure no GVHD prior to contacting sister for further donation. This was discussed with Pam today.   08/18/16: Off tac 9 weeks.??PB Chimerisms show??85% donor CD3 and >95% donor unfractionated.  08/24/16: Off tac 10 weeks. BM Chimerisms show 79% donor CD3 and >95% donor unfractionated.  09/07/16: Off tac 12 weeks. PB Chimerisms show 91% donor CD3 and >95% donor unfractionated.??  09/28/16: PB chimerism 94% donor CD3 and >95% donor unfractionated.  10/26/16: PB chimerism 90% donor CD3 and >95% donor unfractionated.??  11/23/16: PB chimerism 95% donor CD3 and >95% donor unfractionated.??  12/22/16: PB chimerism 94% donor CD3 and >95% donor unfractionated.??  01/18/17: PB chimerism 93% donor CD3 and >95% donor unfractionated.??  02/15/17: PB chimerism 95% donor CD3 and >95% donor unfractionated.??  03/15/17: PB chimerism 95% donor CD3 and >95% donor unfractionated.??  04/12/17: PB chimerism 95%  donor CD3 and >95% donor unfractionated.??  05/17/17: PB chimerism 95% donor CD3 and >95% donor unfractionated.??  06/21/17: PB chimerism >95% donor CD3 and >95% donor unfractionated.??    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   - Methotrexate on day +1, 3, 6, 11 (received all 4 doses)  - Tac tapered and eventually DC'd 06/15/16????  - ??Has had no e/o GVHD,developed new elevated LFTs noted at visit on 7/31. Liver US(08/18/16): 1.Patent hepatic vasculature with normal flow direction 2.Large simple hepatic cysts.3. Stone in gallbladder.   - 01/18/17: LFTs continue to improve.   - 06/21/17: LFT WNL.    ??  Heme:??CBC stable.   Transfusion criteria: * Pre-med with tylenol and Zyrtec pre- blood or platelet transfusion.   - Hgb <8 and Plt <10K or bleeding.   - Granix if ANC <0.5.   ??  Hx of DVT:  - Completed 6 mos or anticoagulation.     **ID:??  Prophylaxis:   - Continue Valtrex for Zoster prophy 500 mg qd.   - Bactrim stopped due to pancytopenia. Allergic to dapsone. Switched to Pentamidine (Dose #1 - 5/8). Continues on monthly Pentamidine given low CD4 count.   ??  Low CD4  - 06/30/16: Off IS. CD4 count 126.   - 09/07/16: CD4 count 137. ??  - 09/28/16: Received Pentamidine today. Plan to check CD4 level at next visit and continue with monthly pentamidine.   - 10/26/16: CD4 count 180. Continue monthly pentamidine.   - 11/23/16: CD4 count 167. Continue monthly pentamidine.   - 12/22/16: Continue monthly pentamidine (today).  - 01/19/16: Pentamidine due today.    - 02/15/17: Repeat CD4 count 188. Continue pentamidine.   - 03/15/17: Received dose today.   - 04/12/17: Received dose today.   - 05/17/17: CD4 count 216. Received pentamidine today. Will discuss restarting Bactrim DS on Sat and Sun.  - 06/21/17: Pentamidine today. After recovery from cold will discuss restarting Bactrim DS on Sat and Sun.    Viral studies:  - 11/23/16: Viral panels have been negative. Will stop monitoring unless clinically indicated.    - 03/15/17: URTI Respiratory virus panel positive for rhinovirus.     - 06/08/17: Parainfluenza pneumonia. IgG on 5/30 was 630.     Vaccine:   - 09/07/16: First set of post-transplant vaccines. Next set due in the end of February - next visit   Will also consider Shingrix - likely after her immunity is a bit better.    - 03/15/17: Delay vaccines due to cold symptoms. Will plan on the next dose of post-transplant vaccines and Shingrix next visit.   - 04/12/17: Today 12 months post transplant vaccines. First dose of Shingrix vaccine. Next set in 10/2017.   ??  **CV:   Hx of anthracycline induced cardiomyopathy:  - Nov 2017 w/EF 35%. This has now resolved with 01/2016 Echo showing estimated EF of 50%.   - Treated with lisinopril and metoprolol (Metoprolol 50mg  XL).   - Now off all antihypertensives due to intolerance (hypotension).   - 03/15/17: BP WNL. Asymptomatic.   - 04/12/17: Patient reported that her PCP checked her cholesterol and it was borderline high 239. She is wondering if she should take a lipid lowering agents recommended by her PCP. I advised her to discuss this with Dr. Barbette Merino whom she is scheduled to see in a few months.   ??  HTN:??Lisinopril stopped d/t AKI.   - 04/07/16: D/C'd Norvasc. Due to asymptomatic hypotension. Pre-transplant blood pressures run in the 100/70-80 range per  her report.   Per Dr. Barbette Merino, ok to stop metoprolol given low BPs with plan to repeat Echo in 6-8 weeks.  - 07/06/16: Baseline ECHO today showed EF of 45-50%. DC metoprolol.   - 07/13/16: Follow BP. Will repeat ECHO 8-12 weeks after stopping metoprolol. Repeat ECHO scheduled for 10/26/2016.  - 10/26/16: Repeat ECHO showed normal EF. Ms. Jelinek saw Ramond Marrow who decided not to restart ACE inhibitor or beta blocker. Recommended annual follow up with ECHO of the heart.    ??  **GI/Hepatic:??  - Completed VOD prophylaxis.   - Elevated LFTs: Mild elevation at visit on 07/26/16. 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   --- 08/10/16 AST and ALT now 107/170 respectively. Normal amylase and lipase. No abdominal pain or GI symptoms.   --- 08/13/16 ????AST 191 and ALT 283. Alk phos 177.  --- 08/18/16 ??AST 144 and ALT 264. Alk phos 218. Liver US: with patent hepatic vasculature notable for large simple hepatic cyst and gallstones.  --- 09/07/16: AST 136 and ALT 242. Alk phos 197. Stable.   --- 09/28/16: AST 91 and ALT 159. Alk phos 128. LFTs continue to improve.   --- 10/26/16: AST 84 and ALT 139. Alk phos 114. LFTs continue to improve.   --- 11/23/16:  AST 64 and ALT 102. Alk phos 96. LFTs continue to improve.   --- 12/22/16:  AST 51 and ALT 72. Alk phos 79. LFTs continue to improve.   --- 01/18/17: AST 42 and ALT 53. Alk phos 75. LFTs continue to improve.    --- 02/15/17: AST 47 and ALT 53. Alk phos 71. Stable.   --- 06/21/17: LFT WNL.   ??  **Bone Health:  - 03/31/16: DEXA scan showed osteoporosis of the spine and osteopenia of the femoral neck. Zometa and Ca/Vit D.   - 04/02/16: Received 1st dose of Zometa.   - 07/06/16. 2nd dose of Zometa. Completed. Continue Ca/Vit D supplement.   - 03/15/17: Patient received two doses of Zometa. Continue Ca/Vit D supplement. Vit D 38.9 WNL.   ??  Plan: Tracye is 1 yr 4 mo out of her allo-SCT. She has always been a full donor chimera is unfractionated compartment but a mixed chimera in CD3 compartment. The CD3 chimerism now stabilized around 95% although today again >95%. Transaminitis coincided with improvement in chimerism, then slowly improved and eventually resolved.    - CD4 counts slightly above 200. still low, receiving pentamidine. Next visit will consider restarting Bactrim.  - Next set of vaccines 18 mo + second dose of Shingrix in 10/2017.   - RTC in 4 weeks.       Willaim Bane  06/21/2017  11:18 AM

## 2017-06-21 NOTE — Unmapped (Signed)
Pt arrived to clinic for pentamidine tx. Pt tolerated well and was in no acute distress upon leaving.

## 2017-07-11 MED FILL — VALACYCLOVIR/500MG/TAB: VALACYCLOVIR/500MG/TAB | 30 days supply | Qty: 30 | Fill #5

## 2017-07-12 ENCOUNTER — Ambulatory Visit: Admit: 2017-07-12 | Discharge: 2017-07-13 | Payer: PRIVATE HEALTH INSURANCE

## 2017-07-12 ENCOUNTER — Other Ambulatory Visit: Admit: 2017-07-12 | Discharge: 2017-07-13 | Payer: PRIVATE HEALTH INSURANCE

## 2017-07-12 DIAGNOSIS — Z9484 Stem cells transplant status: Secondary | ICD-10-CM

## 2017-07-12 DIAGNOSIS — Z9481 Bone marrow transplant status: Principal | ICD-10-CM

## 2017-07-12 DIAGNOSIS — C9201 Acute myeloblastic leukemia, in remission: Secondary | ICD-10-CM

## 2017-07-12 DIAGNOSIS — D899 Disorder involving the immune mechanism, unspecified: Secondary | ICD-10-CM

## 2017-07-12 LAB — COMPREHENSIVE METABOLIC PANEL
ALBUMIN: 4.3 g/dL (ref 3.5–5.0)
ALKALINE PHOSPHATASE: 61 U/L (ref 38–126)
ALT (SGPT): 27 U/L (ref 15–48)
ANION GAP: 6 mmol/L — ABNORMAL LOW (ref 9–15)
AST (SGOT): 28 U/L (ref 14–38)
BLOOD UREA NITROGEN: 9 mg/dL (ref 7–21)
BUN / CREAT RATIO: 13
CHLORIDE: 104 mmol/L (ref 98–107)
CO2: 28 mmol/L (ref 22.0–30.0)
CREATININE: 0.72 mg/dL (ref 0.60–1.00)
EGFR CKD-EPI AA FEMALE: >90 mL/min/{1.73_m2} (ref >=60)
EGFR CKD-EPI NON-AA FEMALE: 89 mL/min/{1.73_m2} (ref >=60)
POTASSIUM: 5.1 mmol/L — ABNORMAL HIGH (ref 3.5–5.0)
PROTEIN TOTAL: 6.9 g/dL (ref 6.5–8.3)
SODIUM: 138 mmol/L (ref 135–145)

## 2017-07-12 LAB — CBC W/ AUTO DIFF
BASOPHILS ABSOLUTE COUNT: 0 10*9/L (ref 0.0–0.1)
BASOPHILS RELATIVE PERCENT: 0.4 %
EOSINOPHILS ABSOLUTE COUNT: 0.2 10*9/L (ref 0.0–0.4)
EOSINOPHILS RELATIVE PERCENT: 5.4 %
HEMATOCRIT: 37.9 % (ref 36.0–46.0)
HEMOGLOBIN: 12.3 g/dL (ref 12.0–16.0)
LARGE UNSTAINED CELLS: 3 % (ref 0–4)
LYMPHOCYTES ABSOLUTE COUNT: 0.8 10*9/L — ABNORMAL LOW (ref 1.5–5.0)
LYMPHOCYTES RELATIVE PERCENT: 23.4 %
MEAN CORPUSCULAR VOLUME: 100.2 fL — ABNORMAL HIGH (ref 80.0–100.0)
MEAN PLATELET VOLUME: 6.8 fL — ABNORMAL LOW (ref 7.0–10.0)
MONOCYTES ABSOLUTE COUNT: 0.3 10*9/L (ref 0.2–0.8)
MONOCYTES RELATIVE PERCENT: 7.3 %
NEUTROPHILS ABSOLUTE COUNT: 2.1 10*9/L (ref 2.0–7.5)
NEUTROPHILS RELATIVE PERCENT: 60.1 %
PLATELET COUNT: 275 10*9/L (ref 150–440)
RED BLOOD CELL COUNT: 3.79 10*12/L — ABNORMAL LOW (ref 4.00–5.20)
RED CELL DISTRIBUTION WIDTH: 13.6 % (ref 12.0–15.0)
WBC ADJUSTED: 3.5 10*9/L — ABNORMAL LOW (ref 4.5–11.0)

## 2017-07-12 LAB — LYMPH MARKER LIMITED,FLOW
ABSOLUTE CD3 CNT: 443 {cells}/uL — ABNORMAL LOW (ref 915–3400)
ABSOLUTE CD8 CNT: 199 {cells}/uL (ref 180–1520)
CD3% (T CELLS)": 49 % — ABNORMAL LOW (ref 61–86)
CD4% (T HELPER)": 26 % — ABNORMAL LOW (ref 34–58)
CD8% T SUPPRESR": 22 % (ref 12–38)

## 2017-07-12 LAB — ALT (SGPT): Alanine aminotransferase:CCnc:Pt:Ser/Plas:Qn:: 27

## 2017-07-12 LAB — NEUTROPHILS ABSOLUTE COUNT: Lab: 2.1

## 2017-07-12 LAB — CD8% T SUPPRESR": Lab: 22

## 2017-07-12 NOTE — Unmapped (Addendum)
BMT Clinic Follow-up Note    Patient Name: Christine [name]  Medical Record Number:  [id number]  Encounter Date: 07/12/2017     BMT Attending MD:  Lanae Boast, MD.     HPI: Christine [name] is a 62 y.o. 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 1 yr 4 mo post transplant.    Tacrolimus stop date 06/15/16.      Interval History:  Christine [name] was hospitalized 5/29-5/31 for parainfluenza pneumonia. She improved with IV antibiotics and supportive care. She was discharged in good condition to complete 7 more days of Levaquin. Today she presents for follow up.   She is feeling quite well, fully recovered. She denies fever, chills. Denies SOB. Denies CP.    She denies nausea, vomiting, or diarrhea. She has no new rashes. She denies bleeding, bruising, or infectious symptoms. She is eating drinking well.     PMH, PSH, and PSocH: Reviewed and there are no changes.     Allergies:  Dapsone; Penicillins; Rasburicase; Sulfasalazine; Vancomycin; Vancomycin analogues; Adhesive; Amoxicillin; Cefepime; Morphine; and Tapentadol   Medications: Reviewed and updated in EPIC.     Review of Systems:  A comprehensive ROS is negative except for as mentioned above in HPI.     Objective:  VS:BP 117/84  - Pulse 87  - Temp 36.7 ??C (98 ??F) (Oral)  - Resp 16  - Wt 64.5 kg (142 lb 3.2 oz)  - LMP  (LMP Unknown)  - SpO2 100%  - BMI 24.43 kg/m??     KPS: 80%, Normal activity with effort; some signs or symptoms of disease (ECOG equivalent 1)    Acute Graft versus Host Disease Assessment: 07/12/2017  Organ stage: 0  Skin stage 0, No Rash, none, 0 % BSA;   Upper GI stage 0, no nausea and no vomiting;   Lower GI stage 0, No Diarrhea;   Hepatic stage 0, Bilirubin normal - 2mg /dL    Overall grade (Current) None     Physical Exam:   General appearance: Christine [name] looks good. Although slightly pale and more fatigued than usual.    Head: Alopecia, wears wig. Normal, non-tender sinuses.  Eyes: EOMs intact, conjunctiva and sclera clear.   Throat slightly erythematous.   Neck: Supple, no palpable LAD  Lungs: CTAB. No wheezes, crackles, or rhonchi.   Heart: RRR. Normal s1/s2. No murmur, rub, or gallop.  Abdomen: Soft, non-tender, normoactive BS, no organomegaly.   Extremities: No edema. Pulses palpable in all extremities.   MSK: No joint deformity, tenderness, or swelling. FROM  Skin: Warm, dry, and intact. No rashes.   Line: R PAC accessed. Non-tender and no notable erythema.     Test Results:  Lab Results   Component Value Date    WBC 3.5 (L) 07/12/2017    HGB 12.3 07/12/2017    HCT 37.9 07/12/2017    PLT 275 07/12/2017       Lab Results   Component Value Date    NA 138 07/12/2017    K 5.1 (H) 07/12/2017    CL 104 07/12/2017    CO2 28.0 07/12/2017    BUN 9 07/12/2017    CREATININE 0.72 07/12/2017    GLU 103 07/12/2017    CALCIUM 9.8 07/12/2017    MG 1.8 06/08/2017    PHOS 3.0 04/15/2016       Lab Results   Component Value Date  BILITOT 0.3 07/12/2017    BILIDIR <0.10 04/15/2016    PROT 6.9 07/12/2017    ALBUMIN 4.3 07/12/2017    ALT 27 07/12/2017    AST 28 07/12/2017    ALKPHOS 61 07/12/2017    GGT 115 (H) 08/10/2016     ASSESSMENT / IMPRESSION:  **AML:??(FLT3-ITD, +11, and IDH2)??in CR prior to transplant.   06/22/16: BMBx:   - ??Normocellular bone marrow (40%) with trilineage hematopoiesis and 3% blasts.  - ??Routine cytogenetics: donor karyotype; FISH (trisomy 11) results are normal.  - ??Flow cytometric MRD results reveal no evidence of aberrant myeloid antigen expression or abnormal myeloblasts.  - ??DNA Chimerism see below.  - Considered maintenance with FLT3 inhibitor??(sorafenib). Was reluctant to start around D+45-60 due to pancytopenia and changing chimerism. Decided against it. Now she is 10 months after transplant. Discussed maintenance again - decided against it.   ??  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 ??  Conditioning:??  ????????????????????????Busulfan PK calculated from test dose 80 mg/hr daily and Fludarabine 40 mg/m2 daily ??day -5 thru day -2  ????????????????????????Day 0 -??02/24/16  Cell dose:??3.24 x10^8 TNC (Marrow)  Chimerisms/Marrow:  03/31/16: Day 30 marrow normocellular w/1% blasts. FISH and cytos are normal. MRD negative.   BM Chimerisms show 75% donor CD3 and >95% donor in unfractionated compartment.   04/22/16: PB Chimerisms show 71% donor CD3 and >95% donor unfractionated. 05/05/16: BM Chimerisms show 65% donor CD3 and >95% donor unfractionated.   05/18/16: PB Chimerisms show 74% donor CD3 and 95% donor unfractionated.   06/01/16: PB Chimerisms show 69% donor CD3 and >95% donor unfractionated.  06/22/16: PB Chimerisms show 80% donor CD3 and >95% donor unfractionated  06/22/16: BM Chimerisms show 79% donor CD3 and >95% donor unfractionated.   07/06/16: PB Chimerisms show 77% donor CD3 and >95% donor unfractionated.  07/13/16: Off tac x 4 weeks.??Chimerism check next visit.  If no full chimerism at 8-10 weeks after discontinuation of tac will consider DLI. We do not have any donor cells stored. If we need to recollect for DLI, will consider mobilizing the donor and collecting CD3+ cells for DLI as well as CD34+ cells for another transplant.   07/26/16: Off tac almost 6 weeks. PB Chimerisms show 74% donor in CD3 and >95% donor unfractionated.   08/10/16: Off tac 8 weeks. Repeat DNA shows slight increase in CD3 to 79%. Plan after discussion with Dr. Oswald Hillock to re-collect sister for more donor cells for DLI. However, today with??elevated LFTs, this is new, will need to follow these and make sure no GVHD prior to contacting sister for further donation. This was discussed with Christine [name] today.   08/18/16: Off tac 9 weeks.??PB Chimerisms show??85% donor CD3 and >95% donor unfractionated.  08/24/16: Off tac 10 weeks. BM Chimerisms show 79% donor CD3 and >95% donor unfractionated.  09/07/16: Off tac 12 weeks. PB Chimerisms show 91% donor CD3 and >95% donor unfractionated.??  09/28/16: PB chimerism 94% donor CD3 and >95% donor unfractionated.  10/26/16: PB chimerism 90% donor CD3 and >95% donor unfractionated.??  11/23/16: PB chimerism 95% donor CD3 and >95% donor unfractionated.??  12/22/16: PB chimerism 94% donor CD3 and >95% donor unfractionated.??  01/18/17: PB chimerism 93% donor CD3 and >95% donor unfractionated.??  02/15/17: PB chimerism 95% donor CD3 and >95% donor unfractionated.??  03/15/17: PB chimerism 95% donor CD3 and >95% donor unfractionated.??  04/12/17: PB chimerism 95% donor CD3 and >95% donor unfractionated.??  05/17/17: PB chimerism  95% donor CD3 and >95% donor unfractionated.??  06/21/17: PB chimerism >95% donor CD3 and >95% donor unfractionated.??    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   - Methotrexate on day +1, 3, 6, 11 (received all 4 doses)  - Tac tapered and eventually DC'd 06/15/16????  - ??Has had no e/o GVHD,developed new elevated LFTs noted at visit on 7/31. Liver US(08/18/16): 1.Patent hepatic vasculature with normal flow direction 2.Large simple hepatic cysts.3. Stone in gallbladder.   - 01/18/17: LFTs continue to improve.   - 07/12/17: LFT WNL.    ??  Heme:??CBC stable.   Transfusion criteria: * Pre-med with tylenol and Zyrtec pre- blood or platelet transfusion.   - Hgb <8 and Plt <10K or bleeding.   - Granix if ANC <0.5.   ??  Hx of DVT:  - Completed 6 mos or anticoagulation.     **ID:??  Prophylaxis:   - Continue Valtrex for Zoster prophy 500 mg qd.   - Bactrim stopped due to pancytopenia. Allergic to dapsone. Switched to Pentamidine (Dose #1 - 5/8). Continues on monthly Pentamidine given low CD4 count.   - 07/12/17: Will DC pentamidine.   ??  Low CD4  - 06/30/16: Off IS. CD4 count 126.   - 09/07/16: CD4 count 137. ??  - 09/28/16: Received Pentamidine today. Plan to check CD4 level at next visit and continue with monthly pentamidine.   - 10/26/16: CD4 count 180. Continue monthly pentamidine.   - 11/23/16: CD4 count 167. Continue monthly pentamidine.   - 12/22/16: Continue monthly pentamidine (today).  - 01/19/16: Pentamidine due today.    - 02/15/17: Repeat CD4 count 188. Continue pentamidine.   - 03/15/17: Received dose today.   - 04/12/17: Received dose today.   - 05/17/17: CD4 count 216. Received pentamidine today. Will discuss restarting Bactrim DS on Sat and Sun.  - 06/21/17: Pentamidine today. After recovery from cold will discuss restarting Bactrim DS on Sat and Sun.  - 07/12/17: Will DC pentamidine.     Viral studies:  - 11/23/16: Viral panels have been negative. Will stop monitoring unless clinically indicated.    - 03/15/17: [facility] Respiratory virus panel positive for rhinovirus.     - 06/08/17: Parainfluenza pneumonia. IgG on 5/30 was 630.   - 07/12/17: Fully recovered.     Vaccine:   - 09/07/16: First set of post-transplant vaccines. Next set due in the end of February - next visit   Will also consider Shingrix - likely after her immunity is a bit better.    - 03/15/17: Delay vaccines due to cold symptoms. Will plan on the next dose of post-transplant vaccines and Shingrix next visit.   - 04/12/17: Today 12 months post transplant vaccines. First dose of Shingrix vaccine. Next set in 10/2017.   ??  **CV:   Hx of anthracycline induced cardiomyopathy:  - Nov 2017 w/EF 35%. This has now resolved with 01/2016 Echo showing estimated EF of 50%.   - Treated with lisinopril and metoprolol (Metoprolol 50mg  XL).   - Now off all antihypertensives due to intolerance (hypotension).   - 03/15/17: BP WNL. Asymptomatic.   - 04/12/17: Patient reported that her PCP checked her cholesterol and it was borderline high 239. She is wondering if she should take a lipid lowering agents recommended by her PCP. I advised her to discuss this with Dr. Barbette Merino whom she is scheduled to see in a few months.   ??  HTN:??Lisinopril stopped d/t AKI.   - 04/07/16: D/C'd Norvasc. Due to asymptomatic  hypotension. Pre-transplant blood pressures run in the 100/70-80 range per her report.   Per Dr. Barbette Merino, ok to stop metoprolol given low BPs with plan to repeat Echo in 6-8 weeks.  - 07/06/16: Baseline ECHO today showed EF of 45-50%. DC metoprolol.   - 07/13/16: Follow BP. Will repeat ECHO 8-12 weeks after stopping metoprolol. Repeat ECHO scheduled for 10/26/2016.  - 10/26/16: Repeat ECHO showed normal EF. Ms. Guizar saw Ramond Marrow who decided not to restart ACE inhibitor or beta blocker. Recommended annual follow up with ECHO of the heart.    ??  **GI/Hepatic:??  - Completed VOD prophylaxis.   - Elevated LFTs: Mild elevation at visit on 07/26/16. 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   --- 08/10/16 AST and ALT now 107/170 respectively. Normal amylase and lipase. No abdominal pain or GI symptoms.   --- 08/13/16 ????AST 191 and ALT 283. Alk phos 177.  --- 08/18/16 ??AST 144 and ALT 264. Alk phos 218. Liver US: with patent hepatic vasculature notable for large simple hepatic cyst and gallstones.  --- 09/07/16: AST 136 and ALT 242. Alk phos 197. Stable.   --- 09/28/16: AST 91 and ALT 159. Alk phos 128. LFTs continue to improve.   --- 10/26/16: AST 84 and ALT 139. Alk phos 114. LFTs continue to improve.   --- 11/23/16:  AST 64 and ALT 102. Alk phos 96. LFTs continue to improve.   --- 12/22/16:  AST 51 and ALT 72. Alk phos 79. LFTs continue to improve.   --- 01/18/17: AST 42 and ALT 53. Alk phos 75. LFTs continue to improve.    --- 02/15/17: AST 47 and ALT 53. Alk phos 71. Stable.   --- 06/21/17: LFT WNL.   --- 07/12/17: LFT WNL.   ??  **Bone Health:  - 03/31/16: DEXA scan showed osteoporosis of the spine and osteopenia of the femoral neck. Zometa and Ca/Vit D.   - 04/02/16: Received 1st dose of Zometa.   - 07/06/16. 2nd dose of Zometa. Completed. Continue Ca/Vit D supplement.   - 03/15/17: Patient received two doses of Zometa. Continue Ca/Vit D supplement. Vit D 38.9 WNL.   ??  Plan: Christine [name] is 1 yr 4 mo out of her allo-SCT. She has always been a full donor chimera is unfractionated compartment but a mixed chimera in CD3 compartment. The CD3 chimerism now stabilized around 95% although today again >95%. Transaminitis coincided with improvement in chimerism, then slowly improved and eventually resolved.    - CD4 counts now consistently above 200 - today 235. Will DC pentamidine prophylaxis.   - Next set of vaccines 18 mo + second dose of Shingrix in 10/2017.   - RTC in 4 weeks.       Christine [name]  07/12/2017  11:00 AM

## 2017-07-12 NOTE — Unmapped (Signed)
Stop pentamidine

## 2017-08-05 NOTE — Unmapped (Signed)
Telephone Call    Christine [name]  [id number]  08/05/2017    Oncologic diagnosis:   AML s/p SCT (1 y 5 months s/p MRD allogeneic SCT with Bu/Flu conditioning on CTN 1301 trial)    Reason for call: Pt sent in basket message inquiring about discontinuing Levaquin. Reviewed patient medication list which at present does not include Levaquin (see below). Pt stated she was confused and was referring to Valtrex. We discussed the need to continue Valtrex for at least 2 years post SCT. I also confirmed she was not being treated for an infection and also that her ANC is > 1.0. Pt verbalized understanding and denied other questions or concerns.     Current Outpatient Medications   Medication Sig Dispense Refill   ??? calcium carbonate-vitamin D3 600 mg (1,500 mg)-800 unit Chew Chew 1 tablet 2 (two) times a day with meals. 100 tablet 0   ??? valACYclovir (VALTREX) 500 MG tablet TAKE 1 TABLET BY MOUTH ONCE DAILY 30 tablet PRN     No current facility-administered medications for this visit.      Allergies   Allergen Reactions   ??? Dapsone      Relative G6PD deficiency (lvl=5.1, lower limit of normal is 5.3)   ??? Penicillins Rash and Hives   ??? Rasburicase      Relative G6PD deficiency (lvl=5.1, lower limit of normal is 5.3)   ??? Sulfasalazine Hives   ??? Vancomycin Other (See Comments)     Avoid if possible, but if needed: Red man syndrome- slow rate over 2 hours, benadryl and ranitidine premed   ??? Vancomycin Analogues Other (See Comments)     Avoid if possible, but if needed: Red man syndrome- slow rate over 2 hours, benadryl and ranitidine premed   ??? Adhesive Rash     Uses mepore     ??? Amoxicillin Rash   ??? Cefepime Rash     Can trial again while taking with antihistamine medications/antihistamine premeds    06/10/17: Has successfully tolerated cefepime with benadryl 25 PO as premed    ??? Morphine Rash     Can trial again while taking with antihistamine medications/antihistamine premeds (fentanyl may be better alternative if possible) ??? Tapentadol Rash     Uses mepore       Lab Results   Component Value Date    WBC 3.5 (L) 07/12/2017    HGB 12.3 07/12/2017    HCT 37.9 07/12/2017    PLT 275 07/12/2017       Lab Results   Component Value Date    NA 138 07/12/2017    K 5.1 (H) 07/12/2017    CL 104 07/12/2017    CO2 28.0 07/12/2017    BUN 9 07/12/2017    CREATININE 0.72 07/12/2017    GLU 103 07/12/2017    CALCIUM 9.8 07/12/2017    MG 1.8 06/08/2017    PHOS 3.0 04/15/2016       Lab Results   Component Value Date    BILITOT 0.3 07/12/2017    BILIDIR <0.10 04/15/2016    PROT 6.9 07/12/2017    ALBUMIN 4.3 07/12/2017    ALT 27 07/12/2017    AST 28 07/12/2017    ALKPHOS 61 07/12/2017    GGT 115 (H) 08/10/2016       Lab Results   Component Value Date    INR 1.22 04/15/2016    APTT 24.3 (L) 04/15/2016       Disposition:  Return to clinic at  previously scheduled interval.    Silvano Rusk ACNP  Acute Care Nurse Practitioner  [facility] BMT  08/05/17

## 2017-08-15 MED FILL — VALACYCLOVIR/500MG/TAB: VALACYCLOVIR/500MG/TAB | 30 days supply | Qty: 30 | Fill #6

## 2017-08-17 ENCOUNTER — Ambulatory Visit: Admit: 2017-08-17 | Discharge: 2017-08-18 | Payer: PRIVATE HEALTH INSURANCE

## 2017-08-17 ENCOUNTER — Other Ambulatory Visit: Admit: 2017-08-17 | Discharge: 2017-08-18 | Payer: PRIVATE HEALTH INSURANCE

## 2017-08-17 DIAGNOSIS — Z9484 Stem cells transplant status: Secondary | ICD-10-CM

## 2017-08-17 DIAGNOSIS — Z9481 Bone marrow transplant status: Secondary | ICD-10-CM

## 2017-08-17 DIAGNOSIS — C9201 Acute myeloblastic leukemia, in remission: Secondary | ICD-10-CM

## 2017-08-17 LAB — CBC W/ AUTO DIFF
BASOPHILS ABSOLUTE COUNT: 0 10*9/L (ref 0.0–0.1)
BASOPHILS RELATIVE PERCENT: 0.5 %
EOSINOPHILS ABSOLUTE COUNT: 0.2 10*9/L (ref 0.0–0.4)
EOSINOPHILS RELATIVE PERCENT: 4 %
HEMATOCRIT: 41.2 % (ref 36.0–46.0)
HEMOGLOBIN: 13.5 g/dL (ref 12.0–16.0)
LARGE UNSTAINED CELLS: 3 % (ref 0–4)
LYMPHOCYTES ABSOLUTE COUNT: 1 10*9/L — ABNORMAL LOW (ref 1.5–5.0)
LYMPHOCYTES RELATIVE PERCENT: 20.7 %
MEAN CORPUSCULAR HEMOGLOBIN CONC: 32.7 g/dL (ref 31.0–37.0)
MEAN CORPUSCULAR VOLUME: 100.5 fL — ABNORMAL HIGH (ref 80.0–100.0)
MEAN PLATELET VOLUME: 6.6 fL — ABNORMAL LOW (ref 7.0–10.0)
MONOCYTES ABSOLUTE COUNT: 0.3 10*9/L (ref 0.2–0.8)
MONOCYTES RELATIVE PERCENT: 7 %
NEUTROPHILS ABSOLUTE COUNT: 3.2 10*9/L (ref 2.0–7.5)
NEUTROPHILS RELATIVE PERCENT: 64.6 %
PLATELET COUNT: 296 10*9/L (ref 150–440)
RED BLOOD CELL COUNT: 4.1 10*12/L (ref 4.00–5.20)
RED CELL DISTRIBUTION WIDTH: 13.9 % (ref 12.0–15.0)
WBC ADJUSTED: 4.9 10*9/L (ref 4.5–11.0)

## 2017-08-17 LAB — COMPREHENSIVE METABOLIC PANEL
ALBUMIN: 4.5 g/dL (ref 3.5–5.0)
ALKALINE PHOSPHATASE: 81 U/L (ref 38–126)
ALT (SGPT): 44 U/L (ref 15–48)
ANION GAP: 4 mmol/L — ABNORMAL LOW (ref 9–15)
AST (SGOT): 36 U/L (ref 14–38)
BILIRUBIN TOTAL: 0.5 mg/dL (ref 0.0–1.2)
BUN / CREAT RATIO: 12
CALCIUM: 10.3 mg/dL — ABNORMAL HIGH (ref 8.5–10.2)
CHLORIDE: 104 mmol/L (ref 98–107)
CO2: 31 mmol/L — ABNORMAL HIGH (ref 22.0–30.0)
CREATININE: 0.76 mg/dL (ref 0.60–1.00)
EGFR CKD-EPI AA FEMALE: >90 mL/min/{1.73_m2} (ref >=60)
EGFR CKD-EPI NON-AA FEMALE: 83 mL/min/{1.73_m2} (ref >=60)
GLUCOSE RANDOM: 86 mg/dL (ref 65–179)
POTASSIUM: 4.7 mmol/L (ref 3.5–5.0)
PROTEIN TOTAL: 7.4 g/dL (ref 6.5–8.3)
SODIUM: 139 mmol/L (ref 135–145)

## 2017-08-17 LAB — EGFR CKD-EPI NON-AA FEMALE: Lab: 83

## 2017-08-17 LAB — MONOCYTES RELATIVE PERCENT: Lab: 7

## 2017-08-17 NOTE — Unmapped (Addendum)
BMT Clinic Follow-up Note    Patient Name: Christine [name]  Medical Record Number:  [id number]  Encounter Date: 08/17/2017     BMT Attending MD:  Lanae Boast, MD.     HPI: Christine [name] is a 62 y.o. 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 1 yr 6 mo post transplant.    Tacrolimus stop date 06/15/16.      Interval History:  Christine [name] presents for follow up. [place] days walked 6-7 miles and tolerated it very well.   She denies fever, chills. Denies SOB. Denies CP.    She denies nausea, vomiting, or diarrhea. She has no new rashes. She denies bleeding, bruising, or infectious symptoms. She is eating and drinking well.     PMH, PSH, and PSocH: Reviewed and there are no changes.     Allergies:  Dapsone; Penicillins; Rasburicase; Sulfasalazine; Vancomycin; Vancomycin analogues; Adhesive; Amoxicillin; Cefepime; Morphine; and Tapentadol   Medications: Reviewed and updated in EPIC.       Current Outpatient Medications:   ???  calcium carbonate-vitamin D3 600 mg (1,500 mg)-800 unit Chew, Chew 1 tablet 2 (two) times a day with meals., Disp: 100 tablet, Rfl: 0  ???  valACYclovir (VALTREX) 500 MG tablet, TAKE 1 TABLET BY MOUTH ONCE DAILY, Disp: 30 each, Rfl: 99    Review of Systems:  A comprehensive ROS is negative except for as mentioned above in HPI.     Objective:  VS:BP 115/83  - Pulse 84  - Temp 36.6 ??C (97.8 ??F) (Oral)  - Resp 16  - Ht 162.5 cm (5' 3.98)  - Wt 64.8 kg (142 lb 12.8 oz)  - LMP  (LMP Unknown)  - SpO2 100%  - BMI 24.53 kg/m??     KPS: 80%, Normal activity with effort; some signs or symptoms of disease (ECOG equivalent 1)    Acute Graft versus Host Disease Assessment: 08/17/2017  Organ stage: 0  Skin stage 0, No Rash, none, 0 % BSA;   Upper GI stage 0, no nausea and no vomiting;   Lower GI stage 0, No Diarrhea;   Hepatic stage 0, Bilirubin normal - 2mg /dL Overall grade (Current) None     Physical Exam:   General appearance: Christine [name] looks good. Although slightly pale and more fatigued than usual.    Head: Alopecia, wears wig. Normal, non-tender sinuses.  Eyes: EOMs intact, conjunctiva and sclera clear.   Throat slightly erythematous.   Neck: Supple, no palpable LAD  Lungs: CTAB. No wheezes, crackles, or rhonchi.   Heart: RRR. Normal s1/s2. No murmur, rub, or gallop.  Abdomen: Soft, non-tender, normoactive BS, no organomegaly.   Extremities: No edema. Pulses palpable in all extremities.   MSK: No joint deformity, tenderness, or swelling. FROM  Skin: Warm, dry, and intact. No rashes.   Line: R PAC accessed. Non-tender and no notable erythema.     Test Results:  Lab Results   Component Value Date    WBC 4.9 08/17/2017    HGB 13.5 08/17/2017    HCT 41.2 08/17/2017    PLT 296 08/17/2017       Lab Results   Component Value Date    NA 139 08/17/2017    K 4.7 08/17/2017    CL 104 08/17/2017    CO2 31.0 (H) 08/17/2017    BUN 9 08/17/2017    CREATININE  0.76 08/17/2017    GLU 86 08/17/2017    CALCIUM 10.3 (H) 08/17/2017    MG 1.8 06/08/2017    PHOS 3.0 04/15/2016       Lab Results   Component Value Date    BILITOT 0.5 08/17/2017    BILIDIR <0.10 04/15/2016    PROT 7.4 08/17/2017    ALBUMIN 4.5 08/17/2017    ALT 44 08/17/2017    AST 36 08/17/2017    ALKPHOS 81 08/17/2017    GGT 115 (H) 08/10/2016     ASSESSMENT / IMPRESSION:  **AML:??(FLT3-ITD, +11, and IDH2)??in CR prior to transplant.   06/22/16: BMBx:   - ??Normocellular bone marrow (40%) with trilineage hematopoiesis and 3% blasts.  - ??Routine cytogenetics: donor karyotype; FISH (trisomy 11) results are normal.  - ??Flow cytometric MRD results reveal no evidence of aberrant myeloid antigen expression or abnormal myeloblasts.  - ??DNA Chimerism see below.  - Considered maintenance with FLT3 inhibitor??(sorafenib). Was reluctant to start around D+45-60 due to pancytopenia and changing chimerism. Decided against it. Now she is 10 months after transplant. Discussed maintenance again - decided against it.   ??  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 ??  Conditioning:??  ????????????????????????Busulfan PK calculated from test dose 80 mg/hr daily and Fludarabine 40 mg/m2 daily ??day -5 thru day -2  ????????????????????????Day 0 -??02/24/16  Cell dose:??3.24 x10^8 TNC (Marrow)  Chimerisms/Marrow:  03/31/16: Day 30 marrow normocellular w/1% blasts. FISH and cytos are normal. MRD negative.   BM Chimerisms show 75% donor CD3 and >95% donor in unfractionated compartment.   04/22/16: PB Chimerisms show 71% donor CD3 and >95% donor unfractionated. 05/05/16: BM Chimerisms show 65% donor CD3 and >95% donor unfractionated.   05/18/16: PB Chimerisms show 74% donor CD3 and 95% donor unfractionated.   06/01/16: PB Chimerisms show 69% donor CD3 and >95% donor unfractionated.  06/22/16: PB Chimerisms show 80% donor CD3 and >95% donor unfractionated  06/22/16: BM Chimerisms show 79% donor CD3 and >95% donor unfractionated.   07/06/16: PB Chimerisms show 77% donor CD3 and >95% donor unfractionated.  07/13/16: Off tac x 4 weeks.??Chimerism check next visit.  If no full chimerism at 8-10 weeks after discontinuation of tac will consider DLI. We do not have any donor cells stored. If we need to recollect for DLI, will consider mobilizing the donor and collecting CD3+ cells for DLI as well as CD34+ cells for another transplant.   07/26/16: Off tac almost 6 weeks. PB Chimerisms show 74% donor in CD3 and >95% donor unfractionated.   08/10/16: Off tac 8 weeks. Repeat DNA shows slight increase in CD3 to 79%. Plan after discussion with Dr. Oswald Hillock to re-collect sister for more donor cells for DLI. However, today with??elevated LFTs, this is new, will need to follow these and make sure no GVHD prior to contacting sister for further donation. This was discussed with Christine [name] today.   08/18/16: Off tac 9 weeks.??PB Chimerisms show??85% donor CD3 and >95% donor unfractionated.  08/24/16: Off tac 10 weeks. BM Chimerisms show 79% donor CD3 and >95% donor unfractionated.  09/07/16: Off tac 12 weeks. PB Chimerisms show 91% donor CD3 and >95% donor unfractionated.??  09/28/16: PB chimerism 94% donor CD3 and >95% donor unfractionated.  10/26/16: PB chimerism 90% donor CD3 and >95% donor unfractionated.??  11/23/16: PB chimerism 95% donor CD3 and >95% donor unfractionated.??  12/22/16: PB chimerism 94% donor CD3 and >95% donor unfractionated.??  01/18/17: PB chimerism 93%  donor CD3 and >95% donor unfractionated.??  02/15/17: PB chimerism 95% donor CD3 and >95% donor unfractionated.??  03/15/17: PB chimerism 95% donor CD3 and >95% donor unfractionated.??  04/12/17: PB chimerism 95% donor CD3 and >95% donor unfractionated.??  05/17/17: PB chimerism 95% donor CD3 and >95% donor unfractionated.??  06/21/17: PB chimerism >95% donor CD3 and >95% donor unfractionated.??  08/17/17:  PB chimerism >95% donor CD3 and >95% donor unfractionated.??    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   - Methotrexate on day +1, 3, 6, 11 (received all 4 doses)  - Tac tapered and eventually DC'd 06/15/16????  - ??Has had no e/o GVHD,developed new elevated LFTs noted at visit on 7/31. Liver US(08/18/16): 1.Patent hepatic vasculature with normal flow direction 2.Large simple hepatic cysts.3. Stone in gallbladder.   - 01/18/17: LFTs continue to improve.   - 08/17/17: LFT WNL.    ??  Heme:??CBC stable.   Transfusion criteria: * Pre-med with tylenol and Zyrtec pre- blood or platelet transfusion.   - Hgb <8 and Plt <10K or bleeding.   - Granix if ANC <0.5.   ??  Hx of DVT:  - Completed 6 mos or anticoagulation.     **ID:??Afeb. No evidence of active infection.   Prophylaxis:   - Continue Valtrex for Zoster prophy 500 mg qd.   - Bactrim stopped due to pancytopenia. Allergic to dapsone. Switched to Pentamidine (Dose #1 - 5/8). Continues on monthly Pentamidine given low CD4 count.   - 07/12/17: Will DC pentamidine.   ??  Low CD4  - 06/30/16: Off IS. CD4 count 126.   - 09/07/16: CD4 count 137. ??  - 09/28/16: Received Pentamidine today. Plan to check CD4 level at next visit and continue with monthly pentamidine.   - 10/26/16: CD4 count 180. Continue monthly pentamidine.   - 11/23/16: CD4 count 167. Continue monthly pentamidine.   - 12/22/16: Continue monthly pentamidine (today).  - 01/19/16: Pentamidine due today.    - 02/15/17: Repeat CD4 count 188. Continue pentamidine.   - 03/15/17: Received dose today.   - 04/12/17: Received dose today.   - 05/17/17: CD4 count 216. Received pentamidine today. Will discuss restarting Bactrim DS on Sat and Sun.  - 06/21/17: Pentamidine today. After recovery from cold will discuss restarting Bactrim DS on Sat and Sun.  - 07/12/17: Will DC pentamidine.     Viral studies:  - 11/23/16: Viral panels have been negative. Will stop monitoring unless clinically indicated.    - 03/15/17: [facility] Respiratory virus panel positive for rhinovirus.     - 06/08/17: Parainfluenza pneumonia. IgG on 5/30 was 630.   - 07/12/17: Fully recovered.     Vaccine:   - 09/07/16: First set of post-transplant vaccines. Next set due in the end of February - next visit   Will also consider Shingrix - likely after her immunity is a bit better.    - 03/15/17: Delay vaccines due to cold symptoms. Will plan on the next dose of post-transplant vaccines and Shingrix next visit.   - 04/12/17: Today 12 months post transplant vaccines. First dose of Shingrix vaccine. Next set in 10/2017.   ??  **CV:   Hx of anthracycline induced cardiomyopathy:  - Nov 2017 w/EF 35%. This has now resolved with 01/2016 Echo showing estimated EF of 50%.   - Treated with lisinopril and metoprolol (Metoprolol 50mg  XL).   - Now off all antihypertensives due to intolerance (hypotension).   - 03/15/17: BP WNL. Asymptomatic.   - 04/12/17: Patient reported that her  PCP checked her cholesterol and it was borderline high 239. She is wondering if she should take a lipid lowering agents recommended by her PCP. I advised her to discuss this with Dr. Barbette Merino whom she is scheduled to see in a few months.   ??  HTN:??Lisinopril stopped d/t AKI.   - 04/07/16: D/C'd Norvasc. Due to asymptomatic hypotension. Pre-transplant blood pressures run in the 100/70-80 range per her report.   Per Dr. Barbette Merino, ok to stop metoprolol given low BPs with plan to repeat Echo in 6-8 weeks.  - 07/06/16: Baseline ECHO today showed EF of 45-50%. DC metoprolol.   - 07/13/16: Follow BP. Will repeat ECHO 8-12 weeks after stopping metoprolol. Repeat ECHO scheduled for 10/26/2016.  - 10/26/16: Repeat ECHO showed normal EF. Christine [name] saw Ramond Marrow who decided not to restart ACE inhibitor or beta blocker. Recommended annual follow up with ECHO of the heart.    ??  **GI/Hepatic:??  - Completed VOD prophylaxis.   - Elevated LFTs: Mild elevation at visit on 07/26/16. 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   --- 08/10/16 AST and ALT now 107/170 respectively. Normal amylase and lipase. No abdominal pain or GI symptoms.   --- 08/13/16 ????AST 191 and ALT 283. Alk phos 177.  --- 08/18/16 ??AST 144 and ALT 264. Alk phos 218. Liver US: with patent hepatic vasculature notable for large simple hepatic cyst and gallstones.  --- 09/07/16: AST 136 and ALT 242. Alk phos 197. Stable.   --- 09/28/16: AST 91 and ALT 159. Alk phos 128. LFTs continue to improve.   --- 10/26/16: AST 84 and ALT 139. Alk phos 114. LFTs continue to improve.   --- 11/23/16:  AST 64 and ALT 102. Alk phos 96. LFTs continue to improve.   --- 12/22/16:  AST 51 and ALT 72. Alk phos 79. LFTs continue to improve.   --- 01/18/17: AST 42 and ALT 53. Alk phos 75. LFTs continue to improve.    --- 02/15/17: AST 47 and ALT 53. Alk phos 71. Stable.   --- 06/21/17: LFT WNL.   --- 08/17/17: LFT WNL.   ??  **Bone Health:  - 03/31/16: DEXA scan showed osteoporosis of the spine and osteopenia of the femoral neck. Zometa and Ca/Vit D.   - 04/02/16: Received 1st dose of Zometa.   - 07/06/16. 2nd dose of Zometa. Completed. Continue Ca/Vit D supplement. - 03/15/17: Patient received two doses of Zometa. Continue Ca/Vit D supplement. Vit D 38.9 WNL.   ??  Plan: Christine [name] is 1 yr 6 mo out of her allo-SCT. She has always been a full donor chimera is unfractionated compartment but a mixed chimera in CD3 compartment. The past two months though the CD3 chimerism has been >95%.    - Next set of vaccines 18 mo + second dose of Shingrix in 10/2017.   - RTC in 4 weeks.       Willaim Bane  08/17/2017  11:19 AM

## 2017-08-17 NOTE — Unmapped (Signed)
Hold Vit D and Calcium supplementation.

## 2017-09-21 ENCOUNTER — Other Ambulatory Visit: Admit: 2017-09-21 | Discharge: 2017-09-21 | Payer: PRIVATE HEALTH INSURANCE

## 2017-09-21 ENCOUNTER — Ambulatory Visit: Admit: 2017-09-21 | Discharge: 2017-09-21 | Payer: PRIVATE HEALTH INSURANCE

## 2017-09-21 DIAGNOSIS — Z9481 Bone marrow transplant status: Secondary | ICD-10-CM

## 2017-09-21 DIAGNOSIS — I427 Cardiomyopathy due to drug and external agent: Secondary | ICD-10-CM

## 2017-09-21 DIAGNOSIS — C9201 Acute myeloblastic leukemia, in remission: Principal | ICD-10-CM

## 2017-09-21 DIAGNOSIS — T451X5A Adverse effect of antineoplastic and immunosuppressive drugs, initial encounter: Secondary | ICD-10-CM

## 2017-09-21 LAB — COMPREHENSIVE METABOLIC PANEL
ALBUMIN: 4.4 g/dL (ref 3.5–5.0)
ALKALINE PHOSPHATASE: 73 U/L (ref 38–126)
ALT (SGPT): 24 U/L (ref 15–48)
ANION GAP: 10 mmol/L (ref 9–15)
AST (SGOT): 29 U/L (ref 14–38)
BILIRUBIN TOTAL: 0.4 mg/dL (ref 0.0–1.2)
BLOOD UREA NITROGEN: 8 mg/dL (ref 7–21)
BUN / CREAT RATIO: 10
CHLORIDE: 104 mmol/L (ref 98–107)
CO2: 25 mmol/L (ref 22.0–30.0)
CREATININE: 0.78 mg/dL (ref 0.60–1.00)
EGFR CKD-EPI AA FEMALE: >90 mL/min/{1.73_m2} (ref >=60)
EGFR CKD-EPI NON-AA FEMALE: 81 mL/min/{1.73_m2} (ref >=60)
GLUCOSE RANDOM: 75 mg/dL (ref 65–179)
POTASSIUM: 4.9 mmol/L (ref 3.5–5.0)
PROTEIN TOTAL: 7.3 g/dL (ref 6.5–8.3)
SODIUM: 139 mmol/L (ref 135–145)

## 2017-09-21 LAB — CBC W/ AUTO DIFF
BASOPHILS ABSOLUTE COUNT: 0 10*9/L (ref 0.0–0.1)
BASOPHILS RELATIVE PERCENT: 0.7 %
EOSINOPHILS ABSOLUTE COUNT: 0.2 10*9/L (ref 0.0–0.4)
HEMATOCRIT: 39.6 % (ref 36.0–46.0)
HEMOGLOBIN: 12.9 g/dL (ref 12.0–16.0)
LYMPHOCYTES ABSOLUTE COUNT: 0.8 10*9/L — ABNORMAL LOW (ref 1.5–5.0)
LYMPHOCYTES RELATIVE PERCENT: 21.1 %
MEAN CORPUSCULAR HEMOGLOBIN CONC: 32.7 g/dL (ref 31.0–37.0)
MEAN CORPUSCULAR HEMOGLOBIN: 32.6 pg (ref 26.0–34.0)
MEAN CORPUSCULAR VOLUME: 99.6 fL (ref 80.0–100.0)
MEAN PLATELET VOLUME: 7.3 fL (ref 7.0–10.0)
MONOCYTES RELATIVE PERCENT: 6.3 %
NEUTROPHILS ABSOLUTE COUNT: 2.3 10*9/L (ref 2.0–7.5)
NEUTROPHILS RELATIVE PERCENT: 62.5 %
PLATELET COUNT: 275 10*9/L (ref 150–440)
RED BLOOD CELL COUNT: 3.97 10*12/L — ABNORMAL LOW (ref 4.00–5.20)
RED CELL DISTRIBUTION WIDTH: 13.8 % (ref 12.0–15.0)
WBC ADJUSTED: 3.7 10*9/L — ABNORMAL LOW (ref 4.5–11.0)

## 2017-09-21 LAB — ALT (SGPT): Alanine aminotransferase:CCnc:Pt:Ser/Plas:Qn:: 24

## 2017-09-21 LAB — MACROCYTES

## 2017-09-21 MED FILL — VALACYCLOVIR 500 MG TABLET: 30 days supply | Qty: 30 | Fill #0 | Status: AC

## 2017-09-21 MED FILL — VALACYCLOVIR 500 MG TABLET: ORAL | 30 days supply | Qty: 30 | Fill #0

## 2017-09-21 NOTE — Unmapped (Signed)
Addended by: Virl Son A on: 09/21/2017 10:40 AM     Modules accepted: Orders

## 2017-09-21 NOTE — Unmapped (Signed)
BMT Clinic Follow-up Note    Patient Name: Christine [name]  Medical Record Number:  [id number]  Encounter Date: 09/21/2017     BMT Attending MD:  Lanae Boast, MD.     HPI: Christine [name] is a 62 y.o. 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 1 yr 7 mo post transplant.    Tacrolimus stop date 06/15/16.      Interval History:  Christine [name] presents for follow up. She continues to do well.    She denies fever, chills. Denies SOB. Denies CP.    She denies nausea, vomiting, or diarrhea. She has no new rashes. She denies bleeding, bruising, or infectious symptoms. She is eating and drinking well.     PMH, PSH, and PSocH: Reviewed and there are no changes.     Allergies:  Dapsone; Penicillins; Rasburicase; Sulfasalazine; Vancomycin; Vancomycin analogues; Adhesive; Amoxicillin; Cefepime; Morphine; and Tapentadol   Medications: Reviewed and updated in EPIC.       Current Outpatient Medications:   ???  valACYclovir (VALTREX) 500 MG tablet, TAKE 1 TABLET BY MOUTH ONCE DAILY., Disp: 30 each, Rfl: 99  ???  calcium carbonate-vitamin D3 600 mg (1,500 mg)-800 unit Chew, Chew 1 tablet 2 (two) times a day with meals. (Patient not taking: Reported on 09/21/2017), Disp: 100 tablet, Rfl: 0    Review of Systems:  A comprehensive ROS is negative except for as mentioned above in HPI.     Objective:  VS:BP 132/75  - Pulse 78  - Temp 36.2 ??C (97.2 ??F) (Oral)  - Wt 65.6 kg (144 lb 9.6 oz)  - LMP  (LMP Unknown)  - SpO2 100%  - BMI 24.84 kg/m??     KPS: 80%, Normal activity with effort; some signs or symptoms of disease (ECOG equivalent 1)    Acute Graft versus Host Disease Assessment: 09/21/2017  Organ stage: 0  Skin stage 0, No Rash, none, 0 % BSA;   Upper GI stage 0, no nausea and no vomiting;   Lower GI stage 0, No Diarrhea;   Hepatic stage 0, Bilirubin normal - 2mg /dL    Overall grade (Current) None     Physical Exam:   General appearance: Christine [name] looks good. Although slightly pale and more fatigued than usual.    Head: Alopecia, wears wig. Normal, non-tender sinuses.  Eyes: EOMs intact, conjunctiva and sclera clear.   Throat slightly erythematous.   Neck: Supple, no palpable LAD  Lungs: CTAB. No wheezes, crackles, or rhonchi.   Heart: RRR. Normal s1/s2. No murmur, rub, or gallop.  Abdomen: Soft, non-tender, normoactive BS, no organomegaly.   Extremities: No edema. Pulses palpable in all extremities.   MSK: No joint deformity, tenderness, or swelling. FROM  Skin: Warm, dry, and intact. No rashes.   Line: R PAC accessed. Non-tender and no notable erythema.     Test Results:  Lab Results   Component Value Date    WBC 4.9 08/17/2017    HGB 13.5 08/17/2017    HCT 41.2 08/17/2017    PLT 296 08/17/2017       Lab Results   Component Value Date    NA 139 08/17/2017    K 4.7 08/17/2017    CL 104 08/17/2017    CO2 31.0 (H) 08/17/2017    BUN 9 08/17/2017    CREATININE 0.76 08/17/2017    GLU 86 08/17/2017    CALCIUM 10.3 (H)  08/17/2017    MG 1.8 06/08/2017    PHOS 3.0 04/15/2016       Lab Results   Component Value Date    BILITOT 0.5 08/17/2017    BILIDIR <0.10 04/15/2016    PROT 7.4 08/17/2017    ALBUMIN 4.5 08/17/2017    ALT 44 08/17/2017    AST 36 08/17/2017    ALKPHOS 81 08/17/2017    GGT 115 (H) 08/10/2016     ASSESSMENT / IMPRESSION:  **AML:??(FLT3-ITD, +11, and IDH2)??in CR prior to transplant.   06/22/16: BMBx:   - ??Normocellular bone marrow (40%) with trilineage hematopoiesis and 3% blasts.  - ??Routine cytogenetics: donor karyotype; FISH (trisomy 11) results are normal.  - ??Flow cytometric MRD results reveal no evidence of aberrant myeloid antigen expression or abnormal myeloblasts.  - ??DNA Chimerism see below.  - Considered maintenance with FLT3 inhibitor??(sorafenib). Was reluctant to start around D+45-60 due to pancytopenia and changing chimerism. Decided against it. Now she is 10 months after transplant. Discussed maintenance again - decided against it.   ??  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 ??  Conditioning:??  ????????????????????????Busulfan PK calculated from test dose 80 mg/hr daily and Fludarabine 40 mg/m2 daily ??day -5 thru day -2  ????????????????????????Day 0 -??02/24/16  Cell dose:??3.24 x10^8 TNC (Marrow)  Chimerisms/Marrow:  03/31/16: Day 30 marrow normocellular w/1% blasts. FISH and cytos are normal. MRD negative.   BM Chimerisms show 75% donor CD3 and >95% donor in unfractionated compartment.   04/22/16: PB Chimerisms show 71% donor CD3 and >95% donor unfractionated. 05/05/16: BM Chimerisms show 65% donor CD3 and >95% donor unfractionated.   05/18/16: PB Chimerisms show 74% donor CD3 and 95% donor unfractionated.   06/01/16: PB Chimerisms show 69% donor CD3 and >95% donor unfractionated.  06/22/16: PB Chimerisms show 80% donor CD3 and >95% donor unfractionated  06/22/16: BM Chimerisms show 79% donor CD3 and >95% donor unfractionated.   07/06/16: PB Chimerisms show 77% donor CD3 and >95% donor unfractionated.  07/13/16: Off tac x 4 weeks.??Chimerism check next visit.  If no full chimerism at 8-10 weeks after discontinuation of tac will consider DLI. We do not have any donor cells stored. If we need to recollect for DLI, will consider mobilizing the donor and collecting CD3+ cells for DLI as well as CD34+ cells for another transplant.   07/26/16: Off tac almost 6 weeks. PB Chimerisms show 74% donor in CD3 and >95% donor unfractionated.   08/10/16: Off tac 8 weeks. Repeat DNA shows slight increase in CD3 to 79%. Plan after discussion with Dr. Oswald Hillock to re-collect sister for more donor cells for DLI. However, today with??elevated LFTs, this is new, will need to follow these and make sure no GVHD prior to contacting sister for further donation. This was discussed with Pam today.   08/18/16: Off tac 9 weeks.??PB Chimerisms show??85% donor CD3 and >95% donor unfractionated.  08/24/16: Off tac 10 weeks. BM Chimerisms show 79% donor CD3 and >95% donor unfractionated.  09/07/16: Off tac 12 weeks. PB Chimerisms show 91% donor CD3 and >95% donor unfractionated.??  09/28/16: PB chimerism 94% donor CD3 and >95% donor unfractionated.  10/26/16: PB chimerism 90% donor CD3 and >95% donor unfractionated.??  11/23/16: PB chimerism 95% donor CD3 and >95% donor unfractionated.??  12/22/16: PB chimerism 94% donor CD3 and >95% donor unfractionated.??  01/18/17: PB chimerism 93% donor CD3 and >95% donor unfractionated.??  02/15/17: PB chimerism 95% donor CD3 and >  95% donor unfractionated.??  03/15/17: PB chimerism 95% donor CD3 and >95% donor unfractionated.??  04/12/17: PB chimerism 95% donor CD3 and >95% donor unfractionated.??  05/17/17: PB chimerism 95% donor CD3 and >95% donor unfractionated.??  06/21/17: PB chimerism >95% donor CD3 and >95% donor unfractionated.??  08/17/17:  PB chimerism >95% donor CD3 and >95% donor unfractionated.??  09/21/17: PB chimerism >95% donor CD3 and >95% donor unfractionated.??    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   - Methotrexate on day +1, 3, 6, 11 (received all 4 doses)  - Tac tapered and eventually DC'd 06/15/16????  - ??Has had no e/o GVHD,developed new elevated LFTs noted at visit on 7/31. Liver US(08/18/16): 1.Patent hepatic vasculature with normal flow direction 2.Large simple hepatic cysts.3. Stone in gallbladder.   - 01/18/17: LFTs continue to improve.   - 09/21/17: LFT WNL.    ??  Heme:??CBC stable.   Transfusion criteria: * Pre-med with tylenol and Zyrtec pre- blood or platelet transfusion.   - Hgb <8 and Plt <10K or bleeding.   - Granix if ANC <0.5.   ??  Hx of DVT:  - Completed 6 mos or anticoagulation.     **ID:??Afeb. No evidence of active infection.   Prophylaxis:   - Continue Valtrex for Zoster prophy 500 mg qd.   - Bactrim stopped due to pancytopenia. Allergic to dapsone. Switched to Pentamidine (Dose #1 - 5/8). Continues on monthly Pentamidine given low CD4 count.   - 07/12/17: Will DC pentamidine.   ??  Low CD4 - 06/30/16: Off IS. CD4 count 126.   - 09/07/16: CD4 count 137. ??  - 09/28/16: Received Pentamidine today. Plan to check CD4 level at next visit and continue with monthly pentamidine.   - 10/26/16: CD4 count 180. Continue monthly pentamidine.   - 11/23/16: CD4 count 167. Continue monthly pentamidine.   - 12/22/16: Continue monthly pentamidine (today).  - 01/19/16: Pentamidine due today.    - 02/15/17: Repeat CD4 count 188. Continue pentamidine.   - 03/15/17: Received dose today.   - 04/12/17: Received dose today.   - 05/17/17: CD4 count 216. Received pentamidine today. Will discuss restarting Bactrim DS on Sat and Sun.  - 06/21/17: Pentamidine today. After recovery from cold will discuss restarting Bactrim DS on Sat and Sun.  - 07/12/17: Will DC pentamidine.     Viral studies:  - 11/23/16: Viral panels have been negative. Will stop monitoring unless clinically indicated.    - 03/15/17: [facility] Respiratory virus panel positive for rhinovirus.     - 06/08/17: Parainfluenza pneumonia. IgG on 5/30 was 630.   - 07/12/17: Fully recovered.     Vaccine:   - 09/07/16: First set of post-transplant vaccines. Next set due in the end of February - next visit   Will also consider Shingrix - likely after her immunity is a bit better.    - 03/15/17: Delay vaccines due to cold symptoms. Will plan on the next dose of post-transplant vaccines and Shingrix next visit.   - 04/12/17: Today 12 months post transplant vaccines. First dose of Shingrix vaccine. Next set in 10/2017.   ??  **CV:   Hx of anthracycline induced cardiomyopathy:  - Nov 2017 w/EF 35%. This has now resolved with 01/2016 Echo showing estimated EF of 50%.   - Treated with lisinopril and metoprolol (Metoprolol 50mg  XL).   - Now off all antihypertensives due to intolerance (hypotension).   - 03/15/17: BP WNL. Asymptomatic.   - 04/12/17: Patient reported that her PCP checked her cholesterol  and it was borderline high 239. She is wondering if she should take a lipid lowering agents recommended by her PCP. I advised her to discuss this with Dr. Barbette Merino whom she is scheduled to see in a few months.   ??  HTN:??Lisinopril stopped d/t AKI.   - 04/07/16: D/C'd Norvasc. Due to asymptomatic hypotension. Pre-transplant blood pressures run in the 100/70-80 range per her report.   Per Dr. Barbette Merino, ok to stop metoprolol given low BPs with plan to repeat Echo in 6-8 weeks.  - 07/06/16: Baseline ECHO today showed EF of 45-50%. DC metoprolol.   - 07/13/16: Follow BP. Will repeat ECHO 8-12 weeks after stopping metoprolol. Repeat ECHO scheduled for 10/26/2016.  - 10/26/16: Repeat ECHO showed normal EF. Christine [name] saw Ramond Marrow who decided not to restart ACE inhibitor or beta blocker. Recommended annual follow up with ECHO of the heart.    ??  **GI/Hepatic:??  - Completed VOD prophylaxis.   - Elevated LFTs: Mild elevation at visit on 07/26/16. 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   --- 08/10/16 AST and ALT now 107/170 respectively. Normal amylase and lipase. No abdominal pain or GI symptoms.   --- 08/13/16 ????AST 191 and ALT 283. Alk phos 177.  --- 08/18/16 ??AST 144 and ALT 264. Alk phos 218. Liver US: with patent hepatic vasculature notable for large simple hepatic cyst and gallstones.  --- 09/07/16: AST 136 and ALT 242. Alk phos 197. Stable.   --- 09/28/16: AST 91 and ALT 159. Alk phos 128. LFTs continue to improve.   --- 10/26/16: AST 84 and ALT 139. Alk phos 114. LFTs continue to improve.   --- 11/23/16:  AST 64 and ALT 102. Alk phos 96. LFTs continue to improve.   --- 12/22/16:  AST 51 and ALT 72. Alk phos 79. LFTs continue to improve.   --- 01/18/17: AST 42 and ALT 53. Alk phos 75. LFTs continue to improve.    --- 02/15/17: AST 47 and ALT 53. Alk phos 71. Stable.   --- 06/21/17: LFT WNL.   --- 09/21/17: LFT WNL.   ??  **Bone Health:  - 03/31/16: DEXA scan showed osteoporosis of the spine and osteopenia of the femoral neck. Zometa and Ca/Vit D.   - 04/02/16: Received 1st dose of Zometa.   - 07/06/16. 2nd dose of Zometa. Completed. Continue Ca/Vit D supplement.   - 03/15/17: Patient received two doses of Zometa. Continue Ca/Vit D supplement. Vit D 38.9 WNL.   ??  Plan: Christine [name] is 1 yr 7 mo out of her allo-SCT. She has always been a full donor chimera is unfractionated compartment but a mixed chimera in CD3 compartment. The past three months though the CD3 chimerism has been >95%.    - Next set of vaccines 18 mo + second dose of Shingrix in 10/2017.   - RTC in 4 weeks.       Christine [name]  09/21/2017  10:38 AM

## 2017-11-08 ENCOUNTER — Encounter
Admit: 2017-11-08 | Discharge: 2017-11-13 | Disposition: A | Discharge status: Home / Self Care | Payer: PRIVATE HEALTH INSURANCE | Attending: Certified Registered"

## 2017-11-08 ENCOUNTER — Ambulatory Visit
Admit: 2017-11-08 | Discharge: 2017-11-13 | Disposition: A | Discharge status: Home / Self Care | Payer: PRIVATE HEALTH INSURANCE

## 2017-11-08 DIAGNOSIS — K37 Unspecified appendicitis: Principal | ICD-10-CM

## 2017-11-08 LAB — CBC W/ AUTO DIFF
BASOPHILS ABSOLUTE COUNT: 0.1 10*9/L (ref 0.0–0.1)
EOSINOPHILS RELATIVE PERCENT: 0.3 %
HEMATOCRIT: 41.5 % (ref 36.0–46.0)
HEMOGLOBIN: 14 g/dL (ref 12.0–16.0)
LARGE UNSTAINED CELLS: 2 % (ref 0–4)
LYMPHOCYTES ABSOLUTE COUNT: 0.5 10*9/L — ABNORMAL LOW (ref 1.5–5.0)
LYMPHOCYTES RELATIVE PERCENT: 3.9 %
MEAN CORPUSCULAR HEMOGLOBIN CONC: 33.8 g/dL (ref 31.0–37.0)
MEAN CORPUSCULAR HEMOGLOBIN: 33.3 pg (ref 26.0–34.0)
MEAN CORPUSCULAR VOLUME: 98.6 fL (ref 80.0–100.0)
MEAN PLATELET VOLUME: 7.1 fL (ref 7.0–10.0)
MONOCYTES ABSOLUTE COUNT: 0.7 10*9/L (ref 0.2–0.8)
MONOCYTES RELATIVE PERCENT: 5.5 %
NEUTROPHILS RELATIVE PERCENT: 88.4 %
PLATELET COUNT: 286 10*9/L (ref 150–440)
RED BLOOD CELL COUNT: 4.21 10*12/L (ref 4.00–5.20)
RED CELL DISTRIBUTION WIDTH: 13.5 % (ref 12.0–15.0)
WBC ADJUSTED: 13.4 10*9/L — ABNORMAL HIGH (ref 4.5–11.0)

## 2017-11-08 LAB — LIPASE: Triacylglycerol lipase:CCnc:Pt:Ser/Plas:Qn:: 49

## 2017-11-08 LAB — COMPREHENSIVE METABOLIC PANEL
ALBUMIN: 4.6 g/dL (ref 3.5–5.0)
ALKALINE PHOSPHATASE: 80 U/L (ref 38–126)
ALT (SGPT): 17 U/L (ref 15–48)
ANION GAP: 13 mmol/L (ref 7–15)
BILIRUBIN TOTAL: 1 mg/dL (ref 0.0–1.2)
BLOOD UREA NITROGEN: 11 mg/dL (ref 7–21)
BUN / CREAT RATIO: 15
CALCIUM: 10 mg/dL (ref 8.5–10.2)
CHLORIDE: 99 mmol/L (ref 98–107)
CO2: 21 mmol/L — ABNORMAL LOW (ref 22.0–30.0)
CREATININE: 0.71 mg/dL (ref 0.60–1.00)
EGFR CKD-EPI AA FEMALE: >90 mL/min/{1.73_m2} (ref >=60)
EGFR CKD-EPI NON-AA FEMALE: 90 mL/min/{1.73_m2} (ref >=60)
GLUCOSE RANDOM: 144 mg/dL (ref 65–179)
POTASSIUM: 4.1 mmol/L (ref 3.5–5.0)
PROTEIN TOTAL: 7.5 g/dL (ref 6.5–8.3)
SODIUM: 133 mmol/L — ABNORMAL LOW (ref 135–145)

## 2017-11-08 LAB — URINALYSIS WITH CULTURE REFLEX
BACTERIA: NONE SEEN /HPF
BILIRUBIN UA: NEGATIVE
BLOOD UA: NEGATIVE
GLUCOSE UA: NEGATIVE
KETONES UA: NEGATIVE
LEUKOCYTE ESTERASE UA: NEGATIVE
NITRITE UA: NEGATIVE
PH UA: 6.5 (ref 5.0–9.0)
RBC UA: 1 /HPF (ref <=4)
SQUAMOUS EPITHELIAL: <1 /HPF (ref 0–5)
UROBILINOGEN UA: 0.2
WBC UA: 1 /HPF (ref 0–5)

## 2017-11-08 LAB — MACROCYTES

## 2017-11-08 LAB — LACTATE BLOOD VENOUS
Lactate:SCnc:Pt:BldV:Qn:: 1.1
Lactate:SCnc:Pt:BldV:Qn:: 3.4 — ABNORMAL HIGH

## 2017-11-08 LAB — AST (SGOT): Aspartate aminotransferase:CCnc:Pt:Ser/Plas:Qn:: 24

## 2017-11-08 LAB — CLARITY

## 2017-11-08 NOTE — Unmapped (Signed)
r side abd pain started Monday with vomiting. Bone marrow transplant 66mo ago.

## 2017-11-08 NOTE — Unmapped (Signed)
Pt transported to OR at this time. Belongings sent with husband. Consent sent with transporter. PIV access patent. Pt on 2L of o2 via [place].

## 2017-11-08 NOTE — Unmapped (Signed)
Patient rounds completed. The following patient needs were addressed:  Pain, Toileting, Personal Belongings, Plan of Care, Call Bell in Reach and Bed Position Low .

## 2017-11-08 NOTE — Unmapped (Signed)
Pt started on LR bolus for tachicardia

## 2017-11-08 NOTE — Unmapped (Signed)
Surgery at bedside to consent for appendectomy

## 2017-11-08 NOTE — Unmapped (Signed)
[facility]  Emergency Department Provider Note      ED Clinical Impression     Final diagnoses:   None       Initial Impression, ED Course, Assessment and Plan     Impression: Christine [name] is a 62 y.o. 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      Patient afebrile, borderline tachycardia, normotensive, breathing comfortably on room air with oxygen saturation 100%.  She appears quite uncomfortable, lying very still in bed.  Patient acutely tender in right lower quadrant with mild guarding, positive Rovsing sign, no CVA tenderness.  Remainder of physical exam unremarkable.  CBC, CMP, lipase, lactic acid, urinalysis, fentanyl, IV fluids ordered.  Differential diagnosis includes appendicitis, small bowel obstruction, ovarian pathology.     ED Course as of Nov 09 1652   Tue Nov 08, 2017   0530 WBC(!): 13.4   0530 Neutrophil Left Shift(!): 1+   0530 Lactate, Venous(!): 3.4   0539 Lipase: 49   0642 -Dilated appendix containing fluid and appendicolith, with periappendiceal stranding  consistent with noncomplicated acute appendicitis. Small adjacent fluid collection without rim enhancement. No evidence for perforation.  -Small fluid collection along the right inguinal region.   CT Abdomen Pelvis with IV Contrast ONLY     6:44 AM  General surgery paged.  Patient given cefepime.  She has received fentanyl 50 mg x 2 for pain.    Patient admitted to general surgery.    Additional Medical Decision Making     I independently visualized the radiology images.   I reviewed the patient's prior medical records.   I discussed the case with the  admitting provider     Labs and radiology results that were available during my care of the patient were independently reviewed by me and considered in my medical decision making.    Portions of this record have been created using Scientist, clinical (histocompatibility and immunogenetics). Dictation errors have been sought, but may not have been identified and corrected.  ____________________________________________    I have reviewed the triage vital signs and the nursing notes.     History     Chief Complaint  Abdominal Pain      HPI   Christine [name] is a 62 y.o. 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  Symptoms began 3 days ago and have been intermittent but progressively worsening since that time.  Pain acutely worsened an hour prior to arrival.  Patient states she is never had pain this bad before.  She does not been able to keep down food or water for the past 3 days.  Vomiting is been nonbloody nonbilious.  Patient denies diarrhea constipation, urinary frequency or dysuria.  Patient has no history of kidney stones or ovarian cysts.  Patient states she has not immunocompromise at this time, only taking Valtrex.      Past Medical History:   Diagnosis Date   ??? 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  AML (acute myelogenous leukemia) (CMS-HCC)    ??? 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 DVT (deep venous thrombosis) (CMS-HCC)    ??? Hypertension    ??? Osteoporosis     Stopped medications due to interactions with chemo drugs       Patient Active Problem List   Diagnosis   ??? Acute deep vein thrombosis (DVT) of left lower extremity (CMS-HCC)   ??? Osteoporosis (RAF-HCC)   ??? Acute myeloid leukemia (CMS-HCC)   ??? Heart failure (CMS-HCC)   ??? 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 LLE distal DVT Chronic PE on 09/10/15 CTA   ??? Allogeneic stem cell transplant (RAF-HCC)   ??? Hypomagnesemia   ??? Immunocompromised state (CMS-HCC)   ??? Pancytopenia (CMS-HCC)   ??? Immunization due   ??? Pneumonia of both lower lobes due to infectious organism (CMS-HCC)       Past Surgical History:   Procedure Laterality Date   ??? IR INSERT PORT AGE GREATER THAN 5 YRS  12/19/2015    IR INSERT PORT AGE GREATER THAN 5 YRS 12/19/2015 Ammie Dalton, MD IMG VIR MM MMNT   ??? PR UPPER GI ENDOSCOPY,BIOPSY N/A 05/05/2016    Procedure: UGI ENDOSCOPY; WITH BIOPSY, SINGLE OR MULTIPLE;  Surgeon: Jolyn Lent, MD;  Location: [facility];  Service: Gastroenterology   ??? SHOULDER SURGERY           Current Facility-Administered Medications:   ???  acetaminophen (TYLENOL) tablet 1,000 mg, 1,000 mg, Oral, Q8H, Darcel Bayley, MD, 1,000 mg at 11/09/17 0820  ???  clindamycin (CLEOCIN) 600 mg/50 mL IVPB 600 mg, 600 mg, Intravenous, Q8H SCH, Darcel Bayley, MD, Last Rate: 100 mL/hr at 11/09/17 1404, 600 mg at 11/09/17 1404  ???  dextrose (D10W) 10% bolus 250 mL, 25 g, Intravenous, Q30 Min PRN, Imagene Riches, MD  ???  docusate sodium (COLACE) capsule 100 mg, 100 mg, Oral, BID, Imagene Riches, MD, 100 mg at 11/09/17 0820  ???  fentaNYL (PF) (SUBLIMAZE) injection 25 mcg, 25 mcg, Intravenous, Q4H PRN, Darcel Bayley, MD  ???  heparin (porcine) injection 5,000 Units, 5,000 Units, Subcutaneous, Q8H SCH, Imagene Riches, MD, 5,000 Units at 11/09/17 1403  ???  influenza vaccine quad (FLUARIX, FLULAVAL, FLUZONE) (6 MOS & UP) 2019-20, 0.5 mL, Intramuscular, During hospitalization, Suella Broad, MD  ???  lactated Ringers infusion, 10 mL/hr, Intravenous, Continuous, Imagene Riches, MD, Last Rate: 10 mL/hr at 11/08/17 2043, 10 mL/hr at 11/08/17 2043  ???  levofloxacin (LEVAQUIN) 500 mg/100 mL IVPB 500 mg, 500 mg, Intravenous, Q24H, Darcel Bayley, MD, Last Rate: 100 mL/hr at 11/08/17 2046, 500 mg at 11/08/17 2046  ???  magnesium oxide (MAG-OX) tablet 800 mg, 800 mg, Oral, BID, Darcel Bayley, MD, 800 mg at 11/09/17 0820  ???  melatonin tablet 3 mg, 3 mg, Oral, QPM, Darcel Bayley, MD  ???  ondansetron [facility]) injection 4 mg, 4 mg, Intravenous, Q4H PRN, Darcel Bayley, MD  ???  polyethylene glycol (MIRALAX) packet 17 g, 17 g, Oral, Daily PRN, Imagene Riches, MD  ???  potassium & sodium phosphates 250mg  (PHOS-NAK/NEUTRA PHOS) packet 2 packet, 2 packet, Oral, 4x Daily, Darcel Bayley, MD, 2 packet at 11/09/17 1404  ???  traMADol (ULTRAM) tablet 25 mg, 25 mg, Oral, Q4H PRN, 25 mg at 11/09/17 1457 **OR** traMADol (ULTRAM) tablet 50 mg, 50 mg, Oral, Q4H PRN,  Darcel Bayley, MD  ???  valACYclovir (VALTREX) tablet 500 mg, 500 mg, Oral, Daily, Jerel Shepherd, MD    Allergies  Dapsone; Penicillins; Rasburicase; Sulfasalazine; Vancomycin; Vancomycin analogues; Adhesive; Amoxicillin; Cefepime; Morphine; and Tapentadol    Family History   Problem Relation Age of Onset   ??? Breast cancer Mother    ??? Cancer Paternal Grandfather         oral   ??? Cancer Maternal Aunt         unknown primary   ??? Asthma Neg Hx    ??? Allergic rhinitis Neg Hx    ??? Eczema Neg Hx        Social History  Social History     Tobacco Use   ??? Smoking status: Never Smoker   ??? Smokeless tobacco: Never Used   Substance Use Topics   ??? Alcohol use: No   ??? Drug use: No       Review of Systems    Constitutional: Negative for fever.  Eyes: Negative for visual changes.  ENT: Negative for sore throat.  Cardiovascular: Negative for chest pain.  Respiratory: Negative for shortness of breath.  Gastrointestinal: + abdominal pain, +vomiting, - diarrhea.  Genitourinary: Negative for dysuria.  Musculoskeletal: Negative for back pain.  Skin: Negative for rash.    A 10 point review of systems was conducted, all other systems have been reviewed and are negative except as otherwise documented.    Physical Exam     ED Triage Vitals [11/08/17 0405]   Enc Vitals Group      BP 128/79      Heart Rate 100      SpO2 Pulse       Resp 18      Temp 37.1 ??C (98.8 ??F)      Temp Source Skin      SpO2 100 %      Weight       Height       Head Circumference       Peak Flow       Pain Score       Pain Loc       Pain Edu?       Excl. in GC?        Constitutional: Alert and oriented.  Appears quite uncomfortable  Eyes: Conjunctivae are normal.  ENT       Head: Normocephalic and atraumatic.       Nose: No congestion.       Mouth/Throat: Mucous membranes are moist.       Neck: No stridor.  Cardiovascular: Normal rate, regular rhythm.   Respiratory: Normal respiratory effort. Breath sounds are normal.  Gastrointestinal: Soft, diffusely tender, worse in right lower quadrant, positive mild guarding, positive Rovsing sign  Genitourinary: There is no CVA tenderness.  Musculoskeletal: Normal range of motion in all extremities.       Lower extremities are without tenderness, discoloration, or edema.  Neurologic: Normal speech and language. No gross focal neurologic deficits are appreciated.  Skin: Skin is warm, dry and intact. No rash noted.  Psychiatric: Mood and affect are normal. Speech and behavior are normal.    EKG     none    Radiology   Ct Abdomen Pelvis With Iv Contrast Only    Result Date: 11/08/2017  EXAM: CT ABDOMEN PELVIS W CONTRAST DATE: 11/08/2017 6:12 AM ACCESSION: [id number] UN DICTATED: 11/08/2017 6:17 AM INTERPRETATION LOCATION: [facility]     CLINICAL INDICATION: ABDOMINAL PAIN,  RIGHT LOWER QUADRANT      COMPARISON: Chest CT dated 06/09/2017     TECHNIQUE: A spiral CT scan of the abdomen and pelvis was obtained with IV contrast from the lung bases through the pubic symphysis. Images were reconstructed in the axial plane. Coronal and sagittal reformatted images were also provided for further evaluation.     FINDINGS:     LINES AND TUBES: None.     LOWER THORAX: Mild subsegmental atelectasis.[name] Kitchen     HEPATOBILIARY: Multiple subcentimeter hypoattenuating lesions are too small to characterize, but unchanged. Unchanged 3 cm segment IV cyst. The gallbladder is present and otherwise unremarkable. No biliary dilatation.  SPLEEN: 1.9 cm splenic hypodensity, likely reflects a cyst. Other subcentimeter hypodensities are too small to characterize. PANCREAS: Unremarkable.     ADRENALS: Unremarkable. KIDNEYS/URETERS: Symmetric nephrograms. No hydronephrosis. There is excreted contrast within the calyces.     BLADDER: Unremarkable. PELVIC/REPRODUCTIVE ORGANS: Unremarkable uterus and adnexa.     GI TRACT: Small sliding hiatal hernia. There is distention of the appendix which measures 1.6 cm in diameter (3:100), containing fluid and appendicoliths. There is periappendiceal stranding. There is reactive mucosal hyperenhancement of an adjacent distal ileal loop (3:106). There is free fluid along the right paracolic gutter. No rim enhancement to suggest abscess. No intraperitoneal free air     PERITONEUM/RETROPERITONEUM AND MESENTERY: No free air. There is free fluid along the right paracolic gutter and presacral space. LYMPH NODES: Prominent mesenteric lymph nodes, likely reactive. VESSELS: The aorta is normal in caliber.  No significant calcified atherosclerotic disease. The portal venous system is patent. The hepatic veins and IVC are unremarkable.     BONES AND SOFT TISSUES: Small fluid collection along the right inguinal region measuring 1.8 cm (3:122). Multilevel degenerative disc disease. L1 Schmorl's node.         -Dilated appendix containing fluid and appendicolith, with periappendiceal stranding  consistent with noncomplicated acute appendicitis. Small adjacent fluid collection without rim enhancement. No evidence for perforation.     -Small fluid collection along the right inguinal region.        Procedures     Procedure(s) performed: None.           Luther Redo, MD  Resident  11/09/17 [phone]

## 2017-11-08 NOTE — Unmapped (Signed)
Operative Note    Service    Date of Surgery: 11/08/2017  Admit Date: 11/08/2017  Performing Service: General Surgery  Surgeon(s) and Role:     * Suella Broad, MD - Primary     * Imagene Riches, MD - Resident - Assisting     * Doylene Canning, MD - Resident - Assisting    Operative Note    Pre-op Diagnosis: Acute Appendicitis    Post-op Diagnosis: Perforated appendicitis     Procedures Performed: LAPAROSCOPY SURGICAL APPENDECTOMY    Findings:  Acute inflamed perforated appendix with gross contamination with stool and pus    Anesthesia: General    Estimated Blood Loss: 15 mL    Indications for Surgery: The patient was seen and examined prior to surgery. The patient had Urgent, symptomatic, physical exam, laboratory and radiographic of acute appendicitis . The risks, benefits and alternatives to surgery were discussed with the patient, and informed consent was obtained.    Description of the Procedure: The patient was identified in the pre-operative holding area, and brought to the operating room by the anesthesia team. A pre-induction timeout was performed all were in agreement and anesthesia was initiated. Following induction of anesthesia, the patient was prepped and draped in the standard sterile fashion with the left arm tucked and the ulnar nerve protected. A second pre-incision timeout was performed. All were in agreement, and surgery was initiated.    The patient was placed in the supine position and general anesthesia was induced, along with placement of orogastric tube, SCD's (a Foley catheter was not placed because the patient urinated shortly before surgery) . The abdomen was prepped and draped in a sterile fashion. After injection of local anesthetic, a 1cm infraumbilical incision was made and the peritoneal cavity was accessed using an open technique. An 0-vicryl suture on UR6 needle was used to place stay sutures on the edges of the incision. A 12 mm Hassan port was placed through the umbilical incision and a pneumoperitoneum was established to steady pressure of 15 mmHg.  Two additional 5 mm cannulas were then placed in the left lower quadrant of the abdomen and half way between the umbilicus and pubic symphysis, both after pre-incision injection of local anesthetic, and skin incision and trocar placement under direct vision.  A careful evaluation of the entire abdomen was carried out. The patient was placed in Trendelenburg and left lateral decubitus position. The small intestines were retracted in the cephalad and left lateral direction away from the pelvis and right lower quadrant. The appendix was identified. There was evidence of perforation with stool and pus collected near the tip of the appendix.    The appendix was carefully dissected. A window was made in the mesoappendix at the base of the appendix. The appendix was divided at its base using a tan load on the endo-GIA stapler. Minimal appendiceal stump was left in place with healthy tissue on the staple line along the cecum. Another tan load of the endo-GIA was fired across the mesoappendix. There was no evidence of bleeding, leakage, or complication after division of the appendix. Any remaining blood or pus was suctioned out from the abdomen then the abdomen desufflated and the appendix removed from the umbilical port site. Because there was gross contamination, we left a 19 french blake drain in the bed of the appendix and brought it out through the LLQ drain port site. It was secured to the skin with a drain stitch.  The umbilical port site was closed using the pre-placed sutures with adequate closure of the fascia. The trocar site skin wound in the supra pubic area was closed with staples due to contamination.  The patient was then awakened from general anesthesia, extubated, and taken to PACU for recovery.    Instrument, sponge, and needle counts were correct at the conclusion of the case.     Complications: None Specimens:   ID Type Source Tests Collected by Time Destination   1 : appendix Tissue Appendix SURGICAL PATHOLOGY EXAM Doylene Canning, MD 11/08/2017 1341        Teaching Surgeon Attestation:  I was present during all critical and key portions of the procedure and immediately available to furnish services throughout the entire duration of the procedure.     Suella Broad, MD  Assistant Professor  Division of Trauma/Critical Care and Acute Care Surgery  Pager: [phone]     Date: 11/08/2017  Time: 2:35 PM

## 2017-11-08 NOTE — Unmapped (Addendum)
Patient rounds completed. The following patient needs were addressed:  Pain and Toileting .Patient resting in no distress, breathing even and unlabored, no needs expressed at this time, call bell within reach, bed in lowest position, will continue to monitor

## 2017-11-08 NOTE — Unmapped (Signed)
Report given to [name] rn

## 2017-11-08 NOTE — Unmapped (Signed)
Pt remains NPO for surgery. No complaints or needs at this time. Husband at bedside. Witnessed consent on medical record.

## 2017-11-08 NOTE — Unmapped (Signed)
Patient transported to CT Scan  Transported by Radiology  How tranported Stretcher  Cardiac Monitor no

## 2017-11-08 NOTE — Unmapped (Signed)
Pt placed on 2 L [place] for dropping her sats to 85%-88% when asleep

## 2017-11-08 NOTE — Unmapped (Signed)
General Surgery Consult Note      Requesting Attending Physician:  Varney Baas*  Service Requesting Consult:  Emergency Medicine  Service Providing Consult: General Surgery  Consulting Attending: Dr. Micael Hampshire    Assessment:  62 year old female with history of AML status post stem cell transplant 2 years ago presents with right lower quadrant pain nausea, vomiting for the past 3 days and imaging consistent with appendicitis.  Dilated appendix containing fluid and appendicolith with periappendiceal stranding consistent with non-complicated acute appendicitis.  Elevated lactate and WBC, mildly tachycardic    Recommendations:   -Consent for surgery, laparascopic appendectomy.  -N.p.o.  -IV antibiotics    The patient's plan was discussed with chief resident and attending surgeon. If there are any questions, concerns or changes in the patient's clinical status, please feel free to contact the [facility]) consult pager at [phone].     History of Present Illness:   Christine [name] is seen in consultation for acute appendicitis at the request of Varney Baas* on the Emergency Medicine service.  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  Last p.o. intake yesterday night.  She denies shortness of breath, chest pain, back pain, hemoptysis.  She denies IV drug use.    Past Medical History:  Past Medical History:   Diagnosis Date   ??? 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 AML (acute myelogenous leukemia) (CMS-HCC)    ??? 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 DVT (deep venous thrombosis) (CMS-HCC)    ??? Hypertension    ??? Osteoporosis     Stopped medications due to interactions with chemo drugs       Past Surgical History:  Past Surgical History:   Procedure Laterality Date   ??? IR INSERT PORT AGE GREATER THAN 5 YRS  12/19/2015    IR INSERT PORT AGE GREATER THAN 5 YRS 12/19/2015 Ammie Dalton, MD IMG VIR MM MMNT   ??? PR UPPER GI ENDOSCOPY,BIOPSY N/A 05/05/2016    Procedure: UGI ENDOSCOPY; WITH BIOPSY, SINGLE OR MULTIPLE;  Surgeon: Jolyn Lent, MD;  Location: GI PROCEDURES [facility];  Service: Gastroenterology   ??? SHOULDER SURGERY         Medication/Allergies:  Current Facility-Administered Medications   Medication Dose Route Frequency Provider Last Rate Last Dose   ??? cefepime (MAXIPIME) 2 g in dextrose 100 mL IVPB (premix)  2 g Intravenous Once Redge Gainer Hoppens, MD 200 mL/hr at 11/08/17 0633 2 g at 11/08/17 4782   ??? lactated ringers bolus 1,000 mL  1,000 mL Intravenous Once Redge Gainer Hoppens, MD   1,000 mL at 11/08/17 9562   ??? metroNIDAZOLE (FLAGYL) IVPB 500 mg  500 mg Intravenous Once Luther Redo, MD         Current Outpatient Medications   Medication Sig Dispense Refill   ??? calcium carbonate-vitamin D3 600 mg (1,500 mg)-800 unit Chew Chew 1 tablet 2 (two) times a day with meals. (Patient not taking: Reported on 09/21/2017) 100 tablet 0   ??? valACYclovir (VALTREX) 500 MG tablet TAKE 1  TABLET BY MOUTH ONCE DAILY. 30 each 99       Allergies   Allergen Reactions   ??? Dapsone      Relative G6PD deficiency (lvl=5.1, lower limit of normal is 5.3)   ??? Penicillins Rash and Hives   ??? Rasburicase      Relative G6PD deficiency (lvl=5.1, lower limit of normal is 5.3)   ??? Sulfasalazine Hives   ??? Vancomycin Other (See Comments)     Avoid if possible, but if needed: Red man syndrome- slow rate over 2 hours, benadryl and ranitidine premed   ??? Vancomycin Analogues Other (See Comments)     Avoid if possible, but if needed: Red man syndrome- slow rate over 2 hours, benadryl and ranitidine premed   ??? Adhesive Rash     Uses mepore     ??? Amoxicillin Rash   ??? Cefepime Rash     Can trial again while taking with antihistamine medications/antihistamine premeds    06/10/17: Has successfully tolerated cefepime with benadryl 25 PO as premed    ??? Morphine Rash     Can trial again while taking with antihistamine medications/antihistamine premeds (fentanyl may be better alternative if possible)   ??? Tapentadol Rash     Uses mepore       Social History:  Social History     Tobacco Use   ??? Smoking status: Never Smoker   ??? Smokeless tobacco: Never Used   Substance Use Topics   ??? Alcohol use: No   ??? Drug use: No       Family History:  Family History   Problem Relation Age of Onset   ??? Breast cancer Mother    ??? Cancer Paternal Grandfather         oral   ??? Cancer Maternal Aunt         unknown primary   ??? Asthma Neg Hx    ??? Allergic rhinitis Neg Hx    ??? Eczema Neg Hx        Review of Systems  10 systems were reviewed and are negative except as noted specifically in the HPI.    Objective:   BP 110/73  - Pulse 130  - Temp 37.1 ??C (Skin)  - Resp 20  - LMP  (LMP Unknown)  - SpO2 91%     Intake/Output last 3 shifts:  No intake/output data recorded.    Physical Exam  General Appearance:  Alert and oriented, no acute distress    HEENT:  NCAT, PERRL, no scleral icterus    Pulmonary:    Non labored respirations    Cardiovascular:  Regular rate    Abdomen:   RLQ pain, no rebound or guarding.   Extremities:  Warm, well-perfused        Most Recent Labs:  Lab Results   Component Value Date    WBC 13.4 (H) 11/08/2017    HGB 14.0 11/08/2017    HCT 41.5 11/08/2017    PLT 286 11/08/2017       Lab Results   Component Value Date    NA 133 (L) 11/08/2017    K 4.1 11/08/2017    CL 99 11/08/2017    CO2 21.0 (L) 11/08/2017    BUN 11 11/08/2017    CREATININE 0.71 11/08/2017    CALCIUM 10.0 11/08/2017    MG 1.8 06/08/2017    PHOS 3.0 04/15/2016       IMAGING:  Ct Abdomen Pelvis With Iv Contrast Only  Result Date: 11/08/2017  EXAM: CT ABDOMEN PELVIS W CONTRAST DATE: 11/08/2017 6:12 AM ACCESSION: [id number] UN DICTATED: 11/08/2017 6:17 AM INTERPRETATION LOCATION: [facility] CLINICAL INDICATION: ABDOMINAL PAIN, RIGHT LOWER QUADRANT  COMPARISON: Chest CT dated 06/09/2017 TECHNIQUE: A spiral CT scan of the abdomen and pelvis was obtained with IV contrast from the lung bases through the pubic symphysis. Images were reconstructed in the axial plane. Coronal and sagittal reformatted images were also provided for further evaluation. FINDINGS: LINES AND TUBES: None. LOWER THORAX: Mild subsegmental atelectasis.[name] Kitchen HEPATOBILIARY: Multiple subcentimeter hypoattenuating lesions are too small to characterize, but unchanged. Unchanged 3 cm segment IV cyst. The gallbladder is present and otherwise unremarkable. No biliary dilatation.  SPLEEN: 1.9 cm splenic hypodensity, likely reflects a cyst. Other subcentimeter hypodensities are too small to characterize. PANCREAS: Unremarkable. ADRENALS: Unremarkable. KIDNEYS/URETERS: Symmetric nephrograms. No hydronephrosis. There is excreted contrast within the calyces. BLADDER: Unremarkable. PELVIC/REPRODUCTIVE ORGANS: Unremarkable uterus and adnexa. GI TRACT: Small sliding hiatal hernia. There is distention of the appendix which measures 1.6 cm in diameter (3:100), containing fluid and appendicoliths. There is periappendiceal stranding. There is reactive mucosal hyperenhancement of an adjacent distal ileal loop (3:106). There is free fluid along the right paracolic gutter. No rim enhancement to suggest abscess. No intraperitoneal free air PERITONEUM/RETROPERITONEUM AND MESENTERY: No free air. There is free fluid along the right paracolic gutter and presacral space. LYMPH NODES: Prominent mesenteric lymph nodes, likely reactive. VESSELS: The aorta is normal in caliber.  No significant calcified atherosclerotic disease. The portal venous system is patent. The hepatic veins and IVC are unremarkable. BONES AND SOFT TISSUES: Small fluid collection along the right inguinal region measuring 1.8 cm (3:122). Multilevel degenerative disc disease. L1 Schmorl's node.     -Dilated appendix containing fluid and appendicolith, with periappendiceal stranding  consistent with noncomplicated acute appendicitis. Small adjacent fluid collection without rim enhancement. No evidence for perforation. -Small fluid collection along the right inguinal region.      -------------------------------  Dallas Schimke  Surgery, PGY-2

## 2017-11-08 NOTE — Unmapped (Signed)
Patient resting in no distress, breathing even and unlabored, no needs expressed at this time, call bell within reach, bed in lowest position, will continue to monitor

## 2017-11-08 NOTE — Unmapped (Signed)
Inpatient Surgery Update      PRE-OP DOCUMENTATION: PROVIDER CERTIFICATION    I certify that the appropriate documentation of the patient's evaluation, assessment and treatment plan is found within the medical record and that the patientâ€™s condition is unchanged from this earlier assessment.

## 2017-11-09 LAB — PHOSPHORUS: Phosphate:MCnc:Pt:Ser/Plas:Qn:: 2.2 — ABNORMAL LOW

## 2017-11-09 LAB — CBC
HEMATOCRIT: 34.7 % — ABNORMAL LOW (ref 36.0–46.0)
MEAN CORPUSCULAR HEMOGLOBIN CONC: 32.4 g/dL (ref 31.0–37.0)
MEAN CORPUSCULAR HEMOGLOBIN: 32.7 pg (ref 26.0–34.0)
MEAN CORPUSCULAR VOLUME: 101.1 fL — ABNORMAL HIGH (ref 80.0–100.0)
MEAN PLATELET VOLUME: 7.1 fL (ref 7.0–10.0)
PLATELET COUNT: 205 10*9/L (ref 150–440)
RED BLOOD CELL COUNT: 3.43 10*12/L — ABNORMAL LOW (ref 4.00–5.20)
RED CELL DISTRIBUTION WIDTH: 13.6 % (ref 12.0–15.0)
WBC ADJUSTED: 10.2 10*9/L (ref 4.5–11.0)

## 2017-11-09 LAB — EGFR CKD-EPI NON-AA FEMALE: Lab: 85

## 2017-11-09 LAB — BASIC METABOLIC PANEL
BLOOD UREA NITROGEN: 11 mg/dL (ref 7–21)
BUN / CREAT RATIO: 15
CALCIUM: 8.6 mg/dL (ref 8.5–10.2)
CHLORIDE: 101 mmol/L (ref 98–107)
CO2: 28 mmol/L (ref 22.0–30.0)
EGFR CKD-EPI AA FEMALE: >90 mL/min/{1.73_m2} (ref >=60)
EGFR CKD-EPI NON-AA FEMALE: 85 mL/min/{1.73_m2} (ref >=60)
GLUCOSE RANDOM: 94 mg/dL (ref 65–179)
POTASSIUM: 3.7 mmol/L (ref 3.5–5.0)
SODIUM: 136 mmol/L (ref 135–145)

## 2017-11-09 LAB — MEAN CORPUSCULAR VOLUME: Lab: 101.1 — ABNORMAL HIGH

## 2017-11-09 LAB — MAGNESIUM: Magnesium:MCnc:Pt:Ser/Plas:Qn:: 1.5 — ABNORMAL LOW

## 2017-11-09 MED FILL — VALACYCLOVIR 500 MG TABLET: ORAL | 30 days supply | Qty: 30 | Fill #1

## 2017-11-09 MED FILL — VALACYCLOVIR 500 MG TABLET: 30 days supply | Qty: 30 | Fill #1 | Status: AC

## 2017-11-09 NOTE — Unmapped (Addendum)
Christine [name] is a 62 y.o. female with history of AML status post stem cell transplant 2 years ago presents with acute appendicitis. She is s/p laparoscopic appendectomy with findings of perforation on 10/29. She was admitted to the floor for IV antibiotics, pain control, and return of bowel function. She was closely monitored given high index of suspicion for intraabdominal abscess formation. She was given clinda/levo from 10/29-11/7. Her JP drain was removed 11/2. Transitioned to PO clindamycin and levaquin which she will take until 11/7 for a total of 10 days.    During her stay, her diet was slowly advanced and at the time of discharge she was tolerating a regular diet. The patient was able to void spontaneously, was able have her pain controlled with P.O. pain medication, and return to an ambulatory status.  She was examined by the Trauma Surgery team on the day of discharge and was deemed suitable for discharge home. She will be discharged on POD #6 in good condition.    I spent greater than 30 minutes completing this discharge.     Instructions    DIET: You may advance your diet to regular, as tolerated. You may experience bloating or have loose, watery stool for several days after surgery.    INCISION: Look at your incisions at least twice a day. A small amount of drainage is normal, especially in the first couple of days after surgery. If you notice any redness around your incisions that spreads along your skin, or any thick, yellow drainage, you should call the clinic. The surgical glue that was placed over your incisions will dissolve naturally over time. Do not put any ointments or cream over the glue, and do not pull the glue off or pick at it. Avoid wearing tight clothing.    ACTIVITY: You may shower after 24 hours, but do not soak in a tub for 2 to 3 weeks. No lifting greater than 10 pounds or strenuous activity until seen by the doctor in your follow-up visit.    PAIN MEDICATION: Do not drive or drink alcohol while taking prescription pain medication. Take your prescribed pain medication only as needed. You may decrease the amount of pain medication as tolerated, and take Tylenol or a Motrin product as directed on the label.  Pain medication can cause constipation. To avoid this, you should take an over-the-counter stool softener, such as docusate 100mg  1-2 times daily.     Effective July 10, 2015, the [place] signed a [address] that works to address the opioid crisis our state is facing.  It is called the STOP Law, for Strengthen Opioid Misuse Prevention.       As part of this law, we are limited to prescribing no more than a seven-day supply of opioid pain medicine for our surgery patients initally.  (This law also limits prescriptions for patients who have not had surgery to a five-day supply.)     Because opioid prescriptions cannot be renewed electronically or over the phone, a paper prescription must be presented to the pharmacy.  Therefore, a patient must contact our office at least 2-3 business days before you anticipate you will run out of pain medication so that we can develop a plan for you.  The best number to call is [phone].  The on-call provider will NOT be able to renew your prescription.     The STOP Act also monitors the amount of controlled substances that each patient has received from any and all  providers.  Prescriptions for each patient will be monitored by the [place] Controlled Substances Reporting System in accordance with the law.  Thank you for working with [place] so we can provide good pain control in the safest way possible while you recover.     Please note that prescription pain medication refills will not be called into your pharmacy. If you feel that you are needing more pain medication, you will need to be seen in clinic or the Emergency Department for further evaluation to ensure you are not experiencing a complication. Please take note of how many pills you have left in your bottle, and if you are starting to run low and feel that you will need more for adequate pain control, to call the clinic as soon as possible to schedule an appointment, as our clinics are very busy, and we cannot guarantee same week appointments.    The Trauma Surgery office number is [phone]. For emergencies at night or on the week-end, please call [phone]) and ask for the surgery resident on call.    WHEN TO CALL THE SURGEON???S OFFICE:  1. Thick, yellow drainage from your incisions, redness at incisions, bleeding, or separations of wounds.  2. Temperature greater than 101.5  3. Uncontrolled nausea or vomiting.  4. Pain that is not controlled by your pain medication or feels like a gallbladder attack.  5. Jaundice (yellow eyes or skin)    May take Motrin 600mg  with breakfast, lunch, dinner and before bed time to help with pain.     Standard Patient Notification Script  We care deeply about how our patients??? pain is managed after surgery.  During the month after you have surgery, you may receive a call from [place] asking about your postsurgical pain and about how you managed that pain.  The survey will take approximately 5 to 10 minutes and will help [place] to provide better care to our patients.  Thank you in advance for your participation and please answer any calls coming from the [facility] or [place] area!      Follow Up  [facility]  1st Floor Women's and Children's   [address]  [place], [place] [address]  [phone]    2-3 weeks. If you have not been contacted regarding an follow-up appointment date and time within a few days, please call to schedule.  Please note, if you arrive more than 20 minutes late, you will need to reschedule your appointment        *For support and information on substance abuse you may call the [facility]. It is free, confidential 24/7, 365 day-a-year treatment referral and information service (in [place] and [place]) for individuals and families facing mental and/or substance use disorders. 1-800-662-HELP ([phone]).

## 2017-11-09 NOTE — Unmapped (Signed)
GENERAL SURGERY PROGRESS NOTE    Assessment:  Christine [name] is a 62 y.o. female with history of AML status post stem cell transplant 2 years ago presents with right lower quadrant pain nausea, vomiting for the past 3 days and imaging consistent with appendicitis.  Dilated appendix containing fluid and appendicolith with periappendiceal stranding consistent with non-complicated acute appendicitis.  She is status post laparoscopic appendectomy on 10/29.  She remains inpatient for pain control and return of bowel function.    Plan:   ???Pain control: Multimodal  ???Regular diet  ???IV antibiotics: Continue clindamycin/levofloxacin for 4 days (10/29???11/1)  ???Daily labs  ???Floor, awaiting return of bowel function    Subjective:  No acute events overnight.  Pain controlled.  Pulled 1 L on incentive spirometer.  Awaiting return of bowel function.    Objective:   BP 89/57  - Pulse 107  - Temp 37.5 ??C (Oral)  - Resp 16  - Ht 162.6 cm (5' 4)  - Wt 66.7 kg (147 lb)  - LMP  (LMP Unknown)  - SpO2 93%  - BMI 25.23 kg/m??     Physical Exam  General Appearance:  Laying in bed in no acute distress   HEENT:  Normocephalic atraumatic   Pulmonary:    Normal work of breathing on room air   Cardiovascular:  Regular rate    Abdomen:   Nontender and nondistended.  Incisions clean, dry, and intact.   Extremities:  Warm, well-perfused

## 2017-11-09 NOTE — Unmapped (Signed)
No acute changes this shift, progressing appropriately. VSS. No pain reported so far this shift. Pt stated she was not hungry after arrival to unit but would order breakfast in the morning. Pt is able to ambulate in room to the bathroom independently. JP drain remains intact. IV antibiotics infusing as ordered. Abdominal dressing remains C/D/I. Pt sleeping throughout shift. No further needs at this time. All safety measures in place and call bell within reach. Will continue to monitor.    Problem: Adult Inpatient Plan of Care  Goal: Plan of Care Review  Outcome: Progressing  Goal: Patient-Specific Goal (Individualization)  Outcome: Progressing  Goal: Absence of Hospital-Acquired Illness or Injury  Outcome: Progressing  Goal: Optimal Comfort and Wellbeing  Outcome: Progressing  Goal: Readiness for Transition of Care  Outcome: Progressing  Goal: Rounds/Family Conference  Outcome: Progressing

## 2017-11-09 NOTE — Unmapped (Signed)
Care Management  Initial Transition Planning Assessment    Admitted  For Lap appy. Has had bone marrow transplant.               General  Care Manager assessed the patient by : In person interview with patient, Medical record review, Discussion with Clinical Care team  Orientation Level: Oriented X4  Who provides care at home?: N/A(Self care at baseline. Husband can assist as needed. )    Contact/Decision Maker        Extended Emergency Contact Information  Primary Emergency Contact: Buckle,Paul  Address: [address]           [place], [place] [address] [place]  Mobile Phone: [phone]  Relation: Spouse    Legal Next of Kin / Guardian / POA / Advance Directives       Advance Directive (Medical Treatment)  Does patient have an advance directive covering medical treatment?: Patient has advance directive covering medical treatment, copy in chart.(HCPOA1. Husband [employer] 1. Shanda [employer])  Information provided on advance directive:: No  Patient requests assistance:: No         Patient Information  Lives with: Spouse/significant other(Lives with husband)    Type of Residence: Private residence        Location/Detail: [place], [place]    Support Systems: Spouse    Responsibilities/Dependents at home?: No    Home Care services in place prior to admission?: No                  Equipment Currently Used at Home: none       Currently receiving outpatient dialysis?: No       Financial Information   Retired    Need for financial assistance?: No      Type of Residence: Mailing Address:  [address]  [place] [address]  Contacts: Accompanied by: Alone  Patient Phone Number: cell [phone]        Medical Provider(s): Allegra Grana, NP  Reason for Admission: Admitting Diagnosis:  No admission diagnoses are documented for this encounter.  Past Medical History:   has a past medical history of Allogeneic stem cell transplant (RAF-HCC) (02/24/2016), AML (acute myelogenous leukemia) (CMS-HCC), Cardiomyopathy (CMS-HCC), DVT (deep venous thrombosis) (CMS-HCC), Hypertension, and Osteoporosis.  Past Surgical History:   has a past surgical history that includes Shoulder surgery; IR Insert Port Age Greater Than 5 Years (12/19/2015); and pr upper gi endoscopy,biopsy (N/A, 05/05/2016).   Previous admit date: 06/08/2017    Primary Insurance- Payor: BCBS / Plan: BCBS BLUE VALUE WITH [employer] / Product Type: *No Product type* /   Secondary Insurance ??? None  Prescription Coverage ??? Air cabin crew  Preferred Pharmacy - [facility]  [facility] [phone] - [place], [place] - [address]  [facility]    Transportation home: Private vehicle  Level of function prior to admission: Independent          Social Determinants of Health  Social Determinants of Health were addressed in provider documentation.  Please refer to patient history.    Discharge Needs Assessment  Concerns to be Addressed: no discharge needs identified    Clinical Risk Factors: New Diagnosis    Barriers to taking medications: No    Prior overnight hospital stay or ED visit in last 90 days: No    Readmission Within the Last 30 Days: no previous admission in last 30 days  Anticipated Changes Related to Illness: none    Equipment Needed After Discharge: none    Discharge Facility/Level of Care Needs:      Readmission  Risk of Unplanned Readmission Score: UNPLANNED READMISSION SCORE: 15%  Readmitted Within the Last 30 Days?   Patient at risk for readmission?: No    Discharge Plan  Screen findings are: Care Manager reviewed the plan of the patient's care with the Multidisciplinary Team. No discharge planning needs identified at this time. Care Manager will continue to manage plan and monitor patient's progress with the team.    Expected Discharge Date: 11/11/17      Patient and/or family were provided with choice of facilities / services that are available and appropriate to meet post hospital care needs?: No       Initial Assessment complete?: Yes

## 2017-11-09 NOTE — Unmapped (Signed)
No acute events this shift, pt. Progressing appropriately. A/O x4. VSS. O2 sats maintained on RA. PRN pain medication switched around to see if it was effective for abdominal pain. Surgical dressings on abdomen remain clean, dry, and intact. JP drain remains in place, moderate amount of serosanguinous drainage. PIV maintained with MIVF and antibiotics. Adequate intake and output through shift. Pt. Tolerating regular diet. Pt. Ambulated around unit independently. All safety measures in place, will continue to monitor closely.     Problem: Adult Inpatient Plan of Care  Goal: Plan of Care Review  Outcome: Progressing  Goal: Patient-Specific Goal (Individualization)  Outcome: Progressing  Goal: Absence of Hospital-Acquired Illness or Injury  Outcome: Progressing  Goal: Optimal Comfort and Wellbeing  Outcome: Progressing  Goal: Readiness for Transition of Care  Outcome: Progressing  Goal: Rounds/Family Conference  Outcome: Progressing

## 2017-11-10 LAB — BASIC METABOLIC PANEL
ANION GAP: 10 mmol/L (ref 7–15)
BLOOD UREA NITROGEN: 14 mg/dL (ref 7–21)
BUN / CREAT RATIO: 21
CALCIUM: 8.7 mg/dL (ref 8.5–10.2)
CHLORIDE: 97 mmol/L — ABNORMAL LOW (ref 98–107)
CO2: 27 mmol/L (ref 22.0–30.0)
CREATININE: 0.68 mg/dL (ref 0.60–1.00)
EGFR CKD-EPI AA FEMALE: >90 mL/min/{1.73_m2} (ref >=60)
EGFR CKD-EPI NON-AA FEMALE: >90 mL/min/{1.73_m2} (ref >=60)
GLUCOSE RANDOM: 100 mg/dL (ref 65–179)
POTASSIUM: 3.7 mmol/L (ref 3.5–5.0)
SODIUM: 134 mmol/L — ABNORMAL LOW (ref 135–145)

## 2017-11-10 LAB — MAGNESIUM: Magnesium:MCnc:Pt:Ser/Plas:Qn:: 1.7

## 2017-11-10 LAB — CBC
HEMATOCRIT: 35.8 % — ABNORMAL LOW (ref 36.0–46.0)
HEMOGLOBIN: 11.5 g/dL — ABNORMAL LOW (ref 12.0–16.0)
MEAN CORPUSCULAR HEMOGLOBIN CONC: 32.2 g/dL (ref 31.0–37.0)
MEAN CORPUSCULAR HEMOGLOBIN: 32.5 pg (ref 26.0–34.0)
MEAN CORPUSCULAR VOLUME: 100.9 fL — ABNORMAL HIGH (ref 80.0–100.0)
MEAN PLATELET VOLUME: 7.7 fL (ref 7.0–10.0)
PLATELET COUNT: 270 10*9/L (ref 150–440)
RED CELL DISTRIBUTION WIDTH: 13.6 % (ref 12.0–15.0)
WBC ADJUSTED: 12.7 10*9/L — ABNORMAL HIGH (ref 4.5–11.0)

## 2017-11-10 LAB — WBC ADJUSTED: Lab: 12.7 — ABNORMAL HIGH

## 2017-11-10 LAB — CHLORIDE: Chloride:SCnc:Pt:Ser/Plas:Qn:: 97 — ABNORMAL LOW

## 2017-11-10 LAB — PHOSPHORUS: Phosphate:MCnc:Pt:Ser/Plas:Qn:: 3

## 2017-11-10 NOTE — Unmapped (Addendum)
No acute events this shift. Pain consistent, managed with Q4 PRN pain medication.  JP site intact, putting out moderate amount of serous fluid. Pt reporting gas, with 1st BM since post-Op around midnight. UOP through shift. All safety measures in place, remains free from falls, will continue to monitor closely.     Problem: Adult Inpatient Plan of Care  Goal: Plan of Care Review  Outcome: Ongoing - Unchanged  Goal: Patient-Specific Goal (Individualization)  Outcome: Ongoing - Unchanged  Goal: Absence of Hospital-Acquired Illness or Injury  Outcome: Ongoing - Unchanged  Goal: Optimal Comfort and Wellbeing  Outcome: Ongoing - Unchanged  Goal: Readiness for Transition of Care  Outcome: Ongoing - Unchanged  Goal: Rounds/Family Conference  Outcome: Ongoing - Unchanged

## 2017-11-10 NOTE — Unmapped (Signed)
No acute events this shift. Pt alert and oriented.  JP drain intact, c/d/i; moderate output. Pain managed with PO medications. Pt up ad lib, ambulating in room; off unit w/family. Adequate intake and output. Falls and safety precautions in place, will continue to monitor.     Problem: Adult Inpatient Plan of Care  Goal: Plan of Care Review  Outcome: Progressing  Goal: Patient-Specific Goal (Individualization)  Outcome: Progressing  Goal: Absence of Hospital-Acquired Illness or Injury  Outcome: Progressing  Goal: Optimal Comfort and Wellbeing  Outcome: Progressing  Goal: Readiness for Transition of Care  Outcome: Progressing  Goal: Rounds/Family Conference  Outcome: Progressing

## 2017-11-10 NOTE — Unmapped (Signed)
GENERAL SURGERY PROGRESS NOTE    Assessment:  Christine [name] is a 62 y.o. female with history of AML status post stem cell transplant 2 years ago presents with right lower quadrant pain nausea, vomiting for the past 3 days and imaging consistent with appendicitis.  Dilated appendix containing fluid and appendicolith with periappendiceal stranding consistent with non-complicated acute appendicitis.  She is status post laparoscopic appendectomy on 10/29.  She remains inpatient for pain control and monitoring of leukocytosis.    Plan:   ???Pain control: Multimodal  ???Regular diet  ???IV antibiotics: Continue clindamycin/levofloxacin for 4 days (10/29???11/1)  ???Daily labs to monitor leukocytosis given intraabdominal perforation  ???Floor    Subjective:  No acute events overnight.  Pain controlled. Ambulating outside of room. Will continue to monitor leukocytosis    Objective:   BP 118/84  - Pulse 100  - Temp 36.6 ??C (Oral)  - Resp 18  - Ht 162.6 cm (5' 4)  - Wt 66.7 kg (147 lb)  - LMP  (LMP Unknown)  - SpO2 94%  - BMI 25.23 kg/m??     Physical Exam  General Appearance:  Laying in chair in no acute distress. Dressed in home PJs   HEENT:  Normocephalic atraumatic   Pulmonary:    Normal work of breathing on room air   Cardiovascular:  Regular rate    Abdomen:   Nontender and nondistended.  Incisions clean, dry, and intact.   Extremities:  Warm, well-perfused

## 2017-11-11 LAB — CBC
HEMOGLOBIN: 11 g/dL — ABNORMAL LOW (ref 12.0–16.0)
MEAN CORPUSCULAR HEMOGLOBIN CONC: 32.3 g/dL (ref 31.0–37.0)
MEAN CORPUSCULAR HEMOGLOBIN: 32.5 pg (ref 26.0–34.0)
MEAN CORPUSCULAR VOLUME: 100.7 fL — ABNORMAL HIGH (ref 80.0–100.0)
MEAN PLATELET VOLUME: 7 fL (ref 7.0–10.0)
PLATELET COUNT: 259 10*9/L (ref 150–440)
RED BLOOD CELL COUNT: 3.4 10*12/L — ABNORMAL LOW (ref 4.00–5.20)
RED CELL DISTRIBUTION WIDTH: 13.7 % (ref 12.0–15.0)
WBC ADJUSTED: 10.4 10*9/L (ref 4.5–11.0)

## 2017-11-11 LAB — BASIC METABOLIC PANEL
ANION GAP: 6 mmol/L — ABNORMAL LOW (ref 7–15)
BLOOD UREA NITROGEN: 11 mg/dL (ref 7–21)
BUN / CREAT RATIO: 16
CALCIUM: 8.4 mg/dL — ABNORMAL LOW (ref 8.5–10.2)
CO2: 30 mmol/L (ref 22.0–30.0)
CREATININE: 0.69 mg/dL (ref 0.60–1.00)
EGFR CKD-EPI AA FEMALE: >90 mL/min/{1.73_m2} (ref >=60)
EGFR CKD-EPI NON-AA FEMALE: >90 mL/min/{1.73_m2} (ref >=60)
GLUCOSE RANDOM: 100 mg/dL (ref 65–179)
SODIUM: 134 mmol/L — ABNORMAL LOW (ref 135–145)

## 2017-11-11 LAB — PHOSPHORUS: Phosphate:MCnc:Pt:Ser/Plas:Qn:: 2.4 — ABNORMAL LOW

## 2017-11-11 LAB — EGFR CKD-EPI AA FEMALE: Lab: >90

## 2017-11-11 LAB — MAGNESIUM: Magnesium:MCnc:Pt:Ser/Plas:Qn:: 1.9

## 2017-11-11 LAB — RED CELL DISTRIBUTION WIDTH: Lab: 13.7

## 2017-11-11 NOTE — Unmapped (Signed)
No acute events this shift. Pain better controled this evening, managed with 1X  PRN pain medication.  Surgical dressings remain clean, dry, and intact.  JP intact, with moderate serous output. Pt ambulated in room and was up in chair throughout shift. All safety measures remain in place and Patient continues to be monitored.      Problem: Adult Inpatient Plan of Care  Goal: Plan of Care Review  Outcome: Progressing  Goal: Patient-Specific Goal (Individualization)  Outcome: Progressing  Goal: Absence of Hospital-Acquired Illness or Injury  Outcome: Progressing  Goal: Optimal Comfort and Wellbeing  Outcome: Progressing  Goal: Readiness for Transition of Care  Outcome: Progressing  Goal: Rounds/Family Conference  Outcome: Progressing

## 2017-11-11 NOTE — Unmapped (Signed)
Order was placed for a PIV by Venous Access Team (VAT).  Patient was assessed for placement of a PIV. Access was obtained. Blood return noted.  Dressing intact and device well secured.  Flushed with normal saline.  Pt advised to inform RN of any s/s of discomfort at the PIV site.    Workup / Procedure Time:  15 minutes      Pt RN was notified.       Thank you,     Thelma Barge RN Venous Access Team

## 2017-11-11 NOTE — Unmapped (Signed)
No acute events this shift. Pt alert and oriented.  JP drain intact, c/d/i; decreased output this shift. Declined pain management. Tolerating oral antibiotics.  Pain managed with PO medications. Pt up ad lib, ambulating in room and hallway. Adequate intake and output. Falls and safety precautions in place, will continue to monitor.       Problem: Adult Inpatient Plan of Care  Goal: Plan of Care Review  Outcome: Progressing  Goal: Patient-Specific Goal (Individualization)  Outcome: Progressing  Goal: Absence of Hospital-Acquired Illness or Injury  Outcome: Progressing  Goal: Optimal Comfort and Wellbeing  Outcome: Progressing  Goal: Readiness for Transition of Care  Outcome: Progressing  Goal: Rounds/Family Conference  Outcome: Progressing

## 2017-11-11 NOTE — Unmapped (Signed)
GENERAL SURGERY PROGRESS NOTE    Assessment:  Christine [name] is a 62 y.o. female with history of AML status post stem cell transplant 2 years ago presents with right lower quadrant pain nausea, vomiting for the past 3 days and imaging consistent with appendicitis.  Dilated appendix containing fluid and appendicolith with periappendiceal stranding consistent with non-complicated acute appendicitis.  She is status post laparoscopic appendectomy on 10/29.  She remains inpatient for pain control and monitoring of leukocytosis.    Plan:   ???Pain control: Multimodal  ???Regular diet  ???PO antibiotics: Continue clindamycin/levofloxacin for 4 days (10/29???11/1)  ??? Await return of bowel function  ??? Replete potassium and magnesium  ???Daily labs to monitor leukocytosis given intraabdominal perforation  ???Floor    Subjective:  No acute events overnight.  Pain controlled. Ambulating outside of room. Will continue to monitor leukocytosis.  Has not passed any gas yet this morning, though was passing some yesterday.  Voiding adequately.  Drain output 220 cc down from 450.  White blood cell count 10.4.    Objective:   BP 119/80  - Pulse 94  - Temp 36.7 ??C (Oral)  - Resp 16  - Ht 162.6 cm (5' 4)  - Wt 66.7 kg (147 lb)  - LMP  (LMP Unknown)  - SpO2 94%  - BMI 25.23 kg/m??     Physical Exam  General Appearance:  Laying in chair in no acute distress. Dressed in home PJs   HEENT:  Normocephalic atraumatic   Pulmonary:    Normal work of breathing on room air   Cardiovascular:  Regular rate    Abdomen:   Nontender and nondistended.  Incisions clean, dry, and intact.  Drain with moderate amount of clear fluid.   Extremities:  Warm, well-perfused     Lab Results   Component Value Date    WBC 10.4 11/11/2017    HGB 11.0 (L) 11/11/2017    HCT 34.2 (L) 11/11/2017    PLT 259 11/11/2017       Lab Results   Component Value Date    NA 134 (L) 11/11/2017    K 3.6 11/11/2017    CL 98 11/11/2017    CO2 30.0 11/11/2017    BUN 11 11/11/2017    CREATININE 0.69 11/11/2017    GLU 100 11/11/2017    CALCIUM 8.4 (L) 11/11/2017    MG 1.9 11/11/2017    PHOS 2.4 (L) 11/11/2017       Lab Results   Component Value Date    BILITOT 1.0 11/08/2017    BILIDIR <0.10 04/15/2016    PROT 7.5 11/08/2017    ALBUMIN 4.6 11/08/2017    ALT 17 11/08/2017    AST 24 11/08/2017    ALKPHOS 80 11/08/2017    GGT 115 (H) 08/10/2016       Lab Results   Component Value Date    INR 1.22 04/15/2016    APTT 24.3 (L) 04/15/2016

## 2017-11-12 DIAGNOSIS — K3580 Unspecified acute appendicitis: Secondary | ICD-10-CM | POA: Insufficient documentation

## 2017-11-12 LAB — BASIC METABOLIC PANEL
ANION GAP: 10 mmol/L (ref 7–15)
BLOOD UREA NITROGEN: 11 mg/dL (ref 7–21)
BUN / CREAT RATIO: 15
CHLORIDE: 99 mmol/L (ref 98–107)
CO2: 25 mmol/L (ref 22.0–30.0)
CREATININE: 0.75 mg/dL (ref 0.60–1.00)
EGFR CKD-EPI AA FEMALE: >90 mL/min/{1.73_m2} (ref >=60)
EGFR CKD-EPI NON-AA FEMALE: 85 mL/min/{1.73_m2} (ref >=60)
GLUCOSE RANDOM: 95 mg/dL (ref 65–179)
POTASSIUM: 3.8 mmol/L (ref 3.5–5.0)

## 2017-11-12 LAB — PHOSPHORUS: Phosphate:MCnc:Pt:Ser/Plas:Qn:: 2.9

## 2017-11-12 LAB — CBC
HEMATOCRIT: 37.3 % (ref 36.0–46.0)
HEMOGLOBIN: 12.2 g/dL (ref 12.0–16.0)
MEAN CORPUSCULAR HEMOGLOBIN: 33 pg (ref 26.0–34.0)
MEAN CORPUSCULAR VOLUME: 100.8 fL — ABNORMAL HIGH (ref 80.0–100.0)
MEAN PLATELET VOLUME: 7.8 fL (ref 7.0–10.0)
PLATELET COUNT: 280 10*9/L (ref 150–440)
RED BLOOD CELL COUNT: 3.7 10*12/L — ABNORMAL LOW (ref 4.00–5.20)
RED CELL DISTRIBUTION WIDTH: 13.8 % (ref 12.0–15.0)
WBC ADJUSTED: 10 10*9/L (ref 4.5–11.0)

## 2017-11-12 LAB — HEMATOCRIT: Lab: 37.3

## 2017-11-12 LAB — EGFR CKD-EPI NON-AA FEMALE: Lab: 85

## 2017-11-12 LAB — MAGNESIUM: Magnesium:MCnc:Pt:Ser/Plas:Qn:: 2.1

## 2017-11-12 MED ORDER — CLINDAMYCIN HCL 300 MG CAPSULE
ORAL_CAPSULE | Freq: Three times a day (TID) | ORAL | 0 refills | 0 days | Status: CP
Start: 2017-11-12 — End: 2017-11-25
  Filled 2017-11-13: qty 15, 5d supply, fill #0

## 2017-11-12 NOTE — Unmapped (Signed)
No acute events this shift, VSS, tolerating reg diet okay, voiding adequately and independednt to the bathroom, denies pain, dressing to the mid lower abdomen  c/d/I, Jp intact, voiding adequately for the shift, safety measures intact.    Problem: Adult Inpatient Plan of Care  Goal: Plan of Care Review  Outcome: Progressing  Goal: Patient-Specific Goal (Individualization)  Outcome: Progressing  Goal: Absence of Hospital-Acquired Illness or Injury  Outcome: Progressing  Goal: Optimal Comfort and Wellbeing  Outcome: Progressing  Goal: Readiness for Transition of Care  Outcome: Progressing  Goal: Rounds/Family Conference  Outcome: Progressing

## 2017-11-12 NOTE — Unmapped (Addendum)
No acute events this shift, pt progressing appropriately. A/Ox4, VSS, O2 Sats maintained on RA. Patient denies pain, no PRN pain medication given this shift. JP drain removed this shift. PIV is ML. Adequate PO intake and urine output through shift. She is passing gas and had a bowel movement yesterday. Patient ambulated multiple times throughout the shift. All safety measures in place, will continue to monitor closely.        Problem: Adult Inpatient Plan of Care  Goal: Plan of Care Review  Outcome: Progressing  Goal: Patient-Specific Goal (Individualization)  Outcome: Progressing  Goal: Absence of Hospital-Acquired Illness or Injury  Outcome: Progressing  Goal: Optimal Comfort and Wellbeing  Outcome: Progressing  Goal: Readiness for Transition of Care  Outcome: Progressing  Goal: Rounds/Family Conference  Outcome: Progressing     Problem: Fall Injury Risk  Goal: Absence of Fall and Fall-Related Injury  Outcome: Progressing

## 2017-11-12 NOTE — Unmapped (Signed)
GENERAL SURGERY PROGRESS NOTE    Assessment:  Christine [name] is a 62 y.o. female with history of AML status post stem cell transplant 2 years ago presents with right lower quadrant pain nausea, vomiting for the past 3 days and imaging consistent with appendicitis.  Dilated appendix containing fluid and appendicolith with periappendiceal stranding consistent with non-complicated acute appendicitis.  She is status post laparoscopic appendectomy on 10/29.  She remains inpatient for pain control and monitoring of leukocytosis.    Plan:   ???Pain control: Multimodal  ???Regular diet  ???PO antibiotics: Continue clindamycin/levofloxacin for 10 days (10/29???11/7)  - Remove JP drain today  ???Daily labs to monitor leukocytosis given intraabdominal perforation  ???Floor    Subjective:  No acute events overnight.  She feels well this morning.  No nausea or vomiting.  Eating without difficulty.  Has been able to eat chicken salad and fruit.  She is passing gas.  Had a bowel movement yesterday.  Drain output 50 cc of clear fluid.    Objective:   BP 112/74  - Pulse 102  - Temp 37.2 ??C (Oral)  - Resp 17  - Ht 162.6 cm (5' 4)  - Wt 66.7 kg (147 lb)  - LMP  (LMP Unknown)  - SpO2 94%  - BMI 25.23 kg/m??     Physical Exam  General Appearance:  Laying in chair in no acute distress. Dressed in home PJs   HEENT:  Normocephalic atraumatic   Pulmonary:    Normal work of breathing on room air   Cardiovascular:  Regular rate    Abdomen:   Nontender and nondistended.  Incisions clean, dry, and intact.  Drain with small amount of clear fluid.   Extremities:  Warm, well-perfused     Lab Results   Component Value Date    WBC 10.0 11/12/2017    HGB 12.2 11/12/2017    HCT 37.3 11/12/2017    PLT 280 11/12/2017       Lab Results   Component Value Date    NA 134 (L) 11/12/2017    K 3.8 11/12/2017    CL 99 11/12/2017    CO2 25.0 11/12/2017    BUN 11 11/12/2017    CREATININE 0.75 11/12/2017    GLU 95 11/12/2017    CALCIUM 8.5 11/12/2017    MG 2.1 11/12/2017 PHOS 2.9 11/12/2017       Lab Results   Component Value Date    BILITOT 1.0 11/08/2017    BILIDIR <0.10 04/15/2016    PROT 7.5 11/08/2017    ALBUMIN 4.6 11/08/2017    ALT 17 11/08/2017    AST 24 11/08/2017    ALKPHOS 80 11/08/2017    GGT 115 (H) 08/10/2016       Lab Results   Component Value Date    INR 1.22 04/15/2016    APTT 24.3 (L) 04/15/2016

## 2017-11-13 LAB — CBC
HEMATOCRIT: 34.6 % — ABNORMAL LOW (ref 36.0–46.0)
HEMOGLOBIN: 11.5 g/dL — ABNORMAL LOW (ref 12.0–16.0)
MEAN CORPUSCULAR HEMOGLOBIN CONC: 33.3 g/dL (ref 31.0–37.0)
MEAN CORPUSCULAR VOLUME: 100.3 fL — ABNORMAL HIGH (ref 80.0–100.0)
MEAN PLATELET VOLUME: 7.6 fL (ref 7.0–10.0)
PLATELET COUNT: 271 10*9/L (ref 150–440)
RED BLOOD CELL COUNT: 3.45 10*12/L — ABNORMAL LOW (ref 4.00–5.20)
RED CELL DISTRIBUTION WIDTH: 14.1 % (ref 12.0–15.0)
WBC ADJUSTED: 8.7 10*9/L (ref 4.5–11.0)

## 2017-11-13 LAB — BASIC METABOLIC PANEL
BLOOD UREA NITROGEN: 9 mg/dL (ref 7–21)
BUN / CREAT RATIO: 14
CALCIUM: 8.6 mg/dL (ref 8.5–10.2)
CHLORIDE: 100 mmol/L (ref 98–107)
CO2: 29 mmol/L (ref 22.0–30.0)
CREATININE: 0.64 mg/dL (ref 0.60–1.00)
EGFR CKD-EPI AA FEMALE: >90 mL/min/{1.73_m2} (ref >=60)
EGFR CKD-EPI NON-AA FEMALE: >90 mL/min/{1.73_m2} (ref >=60)
GLUCOSE RANDOM: 99 mg/dL (ref 65–179)
POTASSIUM: 3.8 mmol/L (ref 3.5–5.0)
SODIUM: 137 mmol/L (ref 135–145)

## 2017-11-13 LAB — POTASSIUM: Potassium:SCnc:Pt:Ser/Plas:Qn:: 3.8

## 2017-11-13 LAB — RED CELL DISTRIBUTION WIDTH: Lab: 14.1

## 2017-11-13 LAB — MAGNESIUM: Magnesium:MCnc:Pt:Ser/Plas:Qn:: 2

## 2017-11-13 LAB — PHOSPHORUS: Phosphate:MCnc:Pt:Ser/Plas:Qn:: 3

## 2017-11-13 MED ORDER — TRAMADOL 50 MG TABLET
ORAL_TABLET | ORAL | 0 refills | 0 days | Status: CP | PRN
Start: 2017-11-13 — End: 2017-11-25
  Filled 2017-11-13: qty 10, 3d supply, fill #0

## 2017-11-13 MED ORDER — ACETAMINOPHEN 500 MG TABLET
ORAL_TABLET | Freq: Three times a day (TID) | ORAL | 0 refills | 0 days | Status: CP
Start: 2017-11-13 — End: 2017-12-20
  Filled 2017-11-13: qty 30, 5d supply, fill #0

## 2017-11-13 MED ORDER — LEVOFLOXACIN 500 MG TABLET
ORAL_TABLET | Freq: Every day | ORAL | 0 refills | 0 days | Status: CP
Start: 2017-11-13 — End: 2017-11-25
  Filled 2017-11-13: qty 4, 4d supply, fill #0

## 2017-11-13 MED FILL — LEVOFLOXACIN 500 MG TABLET: 4 days supply | Qty: 4 | Fill #0 | Status: AC

## 2017-11-13 MED FILL — CLINDAMYCIN HCL 300 MG CAPSULE: 5 days supply | Qty: 15 | Fill #0 | Status: AC

## 2017-11-13 MED FILL — ACETAMINOPHEN 500 MG TABLET: 5 days supply | Qty: 30 | Fill #0 | Status: AC

## 2017-11-13 MED FILL — TRAMADOL 50 MG TABLET: 3 days supply | Qty: 10 | Fill #0 | Status: AC

## 2017-11-13 NOTE — Unmapped (Signed)
No acute events this shift. Pt ambulating independently. No reports of pain. Tolerating regular diet. Abdominal incision c/d/I. VSS on RA. Safety measures in place. Will continue to monitor.   Problem: Adult Inpatient Plan of Care  Goal: Plan of Care Review  Outcome: Progressing  Goal: Patient-Specific Goal (Individualization)  Outcome: Progressing  Goal: Absence of Hospital-Acquired Illness or Injury  Outcome: Progressing  Goal: Optimal Comfort and Wellbeing  Outcome: Progressing  Goal: Readiness for Transition of Care  Outcome: Progressing  Goal: Rounds/Family Conference  Outcome: Progressing     Problem: Fall Injury Risk  Goal: Absence of Fall and Fall-Related Injury  Outcome: Progressing

## 2017-11-13 NOTE — Unmapped (Signed)
Discharge Summary    Admit date: 11/08/2017    Discharge date and time: 11/13/2017    Discharge to:  Home    Discharge Service: [facility]    Discharge Attending Physician: Suella Broad, MD    Discharge  Diagnoses: Acute appendicitis    Secondary Diagnosis: Principal Problem:    11/08/2017: Lap appendectomy fo Acute perforated appendicitis POA: Yes  Resolved Problems:    * No resolved hospital problems. *      OR Procedures:    LAPAROSCOPY SURGICAL APPENDECTOMY  Date  11/08/2017  -------------------     Ancillary Procedures: no procedures    Discharge Day Services: The patient was seen and examined by the surgical team on the day of discharge. Vital signs and laboratory values were stable and within normal limits. Surgical wounds were examined. Discharge plan was discussed with patient, instructions for home care given, and all questions answered.    Subjective   No acute events overnight. Pain Controlled. No fever or chills.    Objective   Patient Vitals for the past 8 hrs:   BP Temp Temp src Pulse Resp SpO2   11/13/17 0430 134/80 37.1 ??C Oral 98 17 97 %     No intake/output data recorded.    General Appearance:  Laying in chair in no acute distress.    HEENT:  Normocephalic atraumatic   Pulmonary:    Normal work of breathing on room air   Cardiovascular:  Regular rate    Abdomen:   Nontender and nondistended.  Incisions clean, dry, and intact.    Extremities:  Warm, well-perfused   ??       Hospital Course:  Christine [name] is a 62 y.o. female with history of AML status post stem cell transplant 2 years ago presents with acute appendicitis. She is s/p laparoscopic appendectomy with findings of perforation on 10/29. She was admitted to the floor for IV antibiotics, pain control, and return of bowel function. She was closely monitored given high index of suspicion for intraabdominal abscess formation. She was given clinda/levo from 10/29-11/7. Her JP drain was removed 11/2. Transitioned to PO clindamycin and levaquin which she will take until 11/7 for a total of 10 days.    During her stay, her diet was slowly advanced and at the time of discharge she was tolerating a regular diet. The patient was able to void spontaneously, was able have her pain controlled with P.O. pain medication, and return to an ambulatory status.  She was examined by the Trauma Surgery team on the day of discharge and was deemed suitable for discharge home. She will be discharged on POD #6 in good condition.    I spent greater than 30 minutes completing this discharge.     Discharge Instructions    DIET: You may advance your diet to regular, as tolerated. You may experience bloating or have loose, watery stool for several days after surgery.    INCISION: Look at your incisions at least twice a day. A small amount of drainage is normal, especially in the first couple of days after surgery. If you notice any redness around your incisions that spreads along your skin, or any thick, yellow drainage, you should call the clinic. The surgical glue that was placed over your incisions will dissolve naturally over time. Do not put any ointments or cream over the glue, and do not pull the glue off or pick at it. Avoid wearing tight clothing.    ACTIVITY: You  may shower after 24 hours, but do not soak in a tub for 2 to 3 weeks. No lifting greater than 10 pounds or strenuous activity until seen by the doctor in your follow-up visit.    PAIN MEDICATION: Do not drive or drink alcohol while taking prescription pain medication. Take your prescribed pain medication only as needed. You may decrease the amount of pain medication as tolerated, and take Tylenol or a Motrin product as directed on the label.  Pain medication can cause constipation. To avoid this, you should take an over-the-counter stool softener, such as docusate 100mg  1-2 times daily.     Effective July 10, 2015, the [place] signed a [address] that works to address the opioid crisis our state is facing.  It is called the STOP Law, for Strengthen Opioid Misuse Prevention.       As part of this law, we are limited to prescribing no more than a seven-day supply of opioid pain medicine for our surgery patients initally.  (This law also limits prescriptions for patients who have not had surgery to a five-day supply.)     Because opioid prescriptions cannot be renewed electronically or over the phone, a paper prescription must be presented to the pharmacy.  Therefore, a patient must contact our office at least 2-3 business days before you anticipate you will run out of pain medication so that we can develop a plan for you.  The best number to call is [phone].  The on-call provider will NOT be able to renew your prescription.     The STOP Act also monitors the amount of controlled substances that each patient has received from any and all providers.  Prescriptions for each patient will be monitored by the [place] Controlled Substances Reporting System in accordance with the law.  Thank you for working with [place] so we can provide good pain control in the safest way possible while you recover.     Please note that prescription pain medication refills will not be called into your pharmacy. If you feel that you are needing more pain medication, you will need to be seen in clinic or the Emergency Department for further evaluation to ensure you are not experiencing a complication. Please take note of how many pills you have left in your bottle, and if you are starting to run low and feel that you will need more for adequate pain control, to call the clinic as soon as possible to schedule an appointment, as our clinics are very busy, and we cannot guarantee same week appointments.    The Trauma Surgery office number is [phone]. For emergencies at night or on the week-end, please call [phone]) and ask for the surgery resident on call.    WHEN TO CALL THE SURGEON???S OFFICE:  1. Thick, yellow drainage from your incisions, redness at incisions, bleeding, or separations of wounds.  2. Temperature greater than 101.5  3. Uncontrolled nausea or vomiting.  4. Pain that is not controlled by your pain medication or feels like a gallbladder attack.  5. Jaundice (yellow eyes or skin)    May take Motrin 600mg  with breakfast, lunch, dinner and before bed time to help with pain.     Standard Patient Notification Script  We care deeply about how our patients??? pain is managed after surgery.  During the month after you have surgery, you may receive a call from [place] asking about your postsurgical pain and about how you managed that pain.  The  survey will take approximately 5 to 10 minutes and will help [place] to provide better care to our patients.  Thank you in advance for your participation and please answer any calls coming from the [facility] or [place] area!      Follow Up  [facility]  1st Floor Women's and Children's   [address]  [place], [place] [address]  [phone]     Please follow-up on 11/22/2017 with Dr. Helyn Numbers for staple removal.      *For support and information on substance abuse you may call the [facility]. It is free, confidential 24/7, 365 day-a-year treatment referral and information service (in [place] and [place]) for individuals and families facing mental and/or substance use disorders. 1-800-662-HELP (4357).         Condition at Discharge: Improved  Discharge Medications:      Medication List      START taking these medications    acetaminophen 500 MG tablet  Commonly known as:  TYLENOL  Take 2 tablets (1,000 mg total) by mouth every eight (8) hours.     clindamycin 300 MG capsule  Commonly known as:  CLEOCIN  Take 1 capsule (300 mg total) by mouth Three (3) times a day. for 5 days     levoFLOXacin 500 MG tablet  Commonly known as:  LEVAQUIN  Take 1 tablet (500 mg total) by mouth daily. for 4 days     traMADol 50 mg tablet  Commonly known as:  ULTRAM  Take 0.5 tablets (25 mg total) by mouth every four (4) hours as needed.   for up to 5 days        CONTINUE taking these medications    valACYclovir 500 MG tablet  Commonly known as:  VALTREX  TAKE 1 TABLET BY MOUTH ONCE DAILY.          Pending Test Results:     Appointments which have been scheduled for you    Nov 29, 2017  9:15 AM EST  (Arrive by 9:00 AM)  ECHOCARDIOGRAM FOLLOW UP/LIMITED ECHO with [facility] RM 3  [facility] [place] [facility]) [address]  [place] [address]  [phone]      Nov 29, 2017 10:05 AM EST  (Arrive by 9:50 AM)  RETURN CARDIO ONCOLOGY with Liborio Nixon, MD  [facility] [place] [facility]) [address]  [place] [address]  [phone]

## 2017-11-23 ENCOUNTER — Ambulatory Visit: Admit: 2017-11-23 | Discharge: 2017-11-24 | Payer: PRIVATE HEALTH INSURANCE

## 2017-11-23 DIAGNOSIS — R509 Fever, unspecified: Principal | ICD-10-CM

## 2017-11-23 DIAGNOSIS — R1031 Right lower quadrant pain: Secondary | ICD-10-CM

## 2017-11-23 DIAGNOSIS — Z9049 Acquired absence of other specified parts of digestive tract: Secondary | ICD-10-CM

## 2017-11-23 DIAGNOSIS — R1084 Generalized abdominal pain: Secondary | ICD-10-CM

## 2017-11-23 DIAGNOSIS — R1032 Left lower quadrant pain: Secondary | ICD-10-CM

## 2017-11-23 LAB — BASIC METABOLIC PANEL
ANION GAP: 11 mmol/L (ref 7–15)
BLOOD UREA NITROGEN: 11 mg/dL (ref 7–21)
CALCIUM: 9.7 mg/dL (ref 8.5–10.2)
CHLORIDE: 102 mmol/L (ref 98–107)
CO2: 25 mmol/L (ref 22.0–30.0)
CREATININE: 0.72 mg/dL (ref 0.60–1.00)
EGFR CKD-EPI AA FEMALE: >90 mL/min/{1.73_m2} (ref >=60)
EGFR CKD-EPI NON-AA FEMALE: 89 mL/min/{1.73_m2} (ref >=60)
GLUCOSE RANDOM: 92 mg/dL (ref 65–179)
SODIUM: 138 mmol/L (ref 135–145)

## 2017-11-23 LAB — CBC
HEMATOCRIT: 37 % (ref 36.0–46.0)
HEMOGLOBIN: 12.3 g/dL (ref 12.0–16.0)
MEAN CORPUSCULAR HEMOGLOBIN CONC: 33.1 g/dL (ref 31.0–37.0)
MEAN CORPUSCULAR HEMOGLOBIN: 33.5 pg (ref 26.0–34.0)
MEAN PLATELET VOLUME: 7.4 fL (ref 7.0–10.0)
PLATELET COUNT: 436 10*9/L (ref 150–440)
RED BLOOD CELL COUNT: 3.66 10*12/L — ABNORMAL LOW (ref 4.00–5.20)
RED CELL DISTRIBUTION WIDTH: 13.4 % (ref 12.0–15.0)
WBC ADJUSTED: 10.3 10*9/L (ref 4.5–11.0)

## 2017-11-23 LAB — GLUCOSE RANDOM: Glucose:MCnc:Pt:Ser/Plas:Qn:: 92

## 2017-11-23 LAB — HEMATOCRIT: Lab: 37

## 2017-11-23 NOTE — Unmapped (Signed)
[facility] ACUTE CARE AND GENERAL SURGERY CLINIC NOTE     Patient Name: Christine [name]  Medical Record Number: [id number]  Date of Service: 11/23/2017    Reason for Visit: s/p lap appendectomy    HPI: Christine [name] is a 62 y.o. female with history of AML status post stem cell transplant 2 years ago presented with acute appendicitis now s/p laparoscopic appendectomy 10/29 with findings of perforation.     She reports at home since discharge she has been having low grade temperatures. She has completed for course of antibiotics but believe the temperatures were going on while being on antibiotics.  Also reports that she had loose stool for a few days post op so stopped taking colace. She reports she has not had a BM since Sunday 11/3.  She Reports pain to RLQ. Denies incisional redness or drainage. Reports eating and drinking fine without nausea, vomiting however not eating a lot because she feels full.     Path: Findings discussed with patient  Acute transmural appendicitis with perforation and acute serositis    Reivew of Systems:  Negative except otherwise noted in the HPI.    Physical Exam:  Vitals:    11/23/17 1215   BP: 118/88   Pulse: 108   Temp: 36.7 ??C (98.1 ??F)   SpO2: 97%       General: awake, alert, afebrile, in NAD  Neuro: Alert and oriented ??3  Lungs: Normal work of breathing on room air  Abdomen: soft, non distended. Pain to RLQ on exam. Incisions are c/d/i, no erythema, edema or ecchymosis noted.  No incisional drainage or incisional hernia noted. Staples removed today.       Assessment and Plan: Christine [name] is a 62 y.o. female who returns to clinic s/p lap appendectomy on 10/29 with findings of perforation.  Since discharge she reports low grade temperatures. On exam no signs or symptoms of infection is noted and patient is healing well.  Discussed with patient continue lifting restriction of not lifting anything heavier than 10-15 lbs for a full 6 weeks post-operatively.      WBC today 10.3 today. Will proceed with CT scan to further evaluate of intra-abdominal abscess    Educated patient on diet and fatigue can to take weeks to months to return back to normal. Recommended protein supplements to help meet dietary needs.     For pain control, discussed with patient to continue OTC Tylenol 500mg  or Motrin 400-600mg  every 6 hours as needed, just to take with food.     Recommend for constipation to take Miralax BID until loose stool.      If any new questions or concerns should arise to give the clinic a call, otherwise continue follow-up with their primary care physician.  Patient was agreeable to plan

## 2017-11-23 NOTE — Unmapped (Signed)
Returned phone call to [employer]. She states she continues to run a low-grade temperature of ~ 99 which is 2 degrees above her normal temperature. She also states he belly is achey. She has not had a normal BM since discharge, but took Colace just once this past Monday. I scheduled her an appointment for this afternoon and reviewed how she should be using her Colace.

## 2017-11-24 ENCOUNTER — Ambulatory Visit
Admit: 2017-11-24 | Discharge: 2017-11-25 | Disposition: A | Discharge status: Home / Self Care | Payer: PRIVATE HEALTH INSURANCE | Source: Ambulatory Visit

## 2017-11-24 DIAGNOSIS — T8149XA Infection following a procedure, other surgical site, initial encounter: Principal | ICD-10-CM

## 2017-11-24 LAB — BASIC METABOLIC PANEL
ANION GAP: 9 mmol/L (ref 7–15)
BLOOD UREA NITROGEN: 12 mg/dL (ref 7–21)
BUN / CREAT RATIO: 16
CHLORIDE: 100 mmol/L (ref 98–107)
CO2: 26 mmol/L (ref 22.0–30.0)
CREATININE: 0.76 mg/dL (ref 0.60–1.00)
EGFR CKD-EPI AA FEMALE: >90 mL/min/{1.73_m2} (ref >=60)
EGFR CKD-EPI NON-AA FEMALE: 83 mL/min/{1.73_m2} (ref >=60)
POTASSIUM: 4.5 mmol/L (ref 3.5–5.0)
SODIUM: 135 mmol/L (ref 135–145)

## 2017-11-24 LAB — MAGNESIUM: Magnesium:MCnc:Pt:Ser/Plas:Qn:: 2.1

## 2017-11-24 LAB — PHOSPHORUS: Phosphate:MCnc:Pt:Ser/Plas:Qn:: 4

## 2017-11-24 LAB — CBC
HEMATOCRIT: 34.7 % — ABNORMAL LOW (ref 36.0–46.0)
HEMOGLOBIN: 11.2 g/dL — ABNORMAL LOW (ref 12.0–16.0)
MEAN CORPUSCULAR HEMOGLOBIN CONC: 32.4 g/dL (ref 31.0–37.0)
MEAN CORPUSCULAR HEMOGLOBIN: 32.9 pg (ref 26.0–34.0)
MEAN CORPUSCULAR VOLUME: 101.3 fL — ABNORMAL HIGH (ref 80.0–100.0)
MEAN PLATELET VOLUME: 6.9 fL — ABNORMAL LOW (ref 7.0–10.0)
RED BLOOD CELL COUNT: 3.42 10*12/L — ABNORMAL LOW (ref 4.00–5.20)
WBC ADJUSTED: 10 10*9/L (ref 4.5–11.0)

## 2017-11-24 LAB — PROTIME: Lab: 12.2

## 2017-11-24 LAB — MEAN CORPUSCULAR VOLUME: Lab: 101.3 — ABNORMAL HIGH

## 2017-11-24 LAB — EGFR CKD-EPI AA FEMALE: Lab: >90

## 2017-11-24 NOTE — Unmapped (Signed)
History and Physical    Assessment/Plan:     Christine [name] is a 62 y.o. female who returned to clinic today s/p lap appendectomy on 10/29 with findings of perforation.  Since discharge she reports low grade temperatures. CT AP 10/13 showed Irregular 8.0 cm abscess extending from the right adnexum into the pouch of Douglas.     Plan:  - We will admit the patient to the General surgery service, [facility] - 4  - VIR consult for drain placement  - Start IV antibiotics  - continue bowel regimen  - tylenol for pain control     Patient was staffed with Dr. Sallyanne Havers who agrees with the above plan.     HPI:  Christine [name] is a 62 y.o. female with history of AML status post stem cell transplant 2 years ago presented with acute appendicitis now s/p laparoscopic appendectomy 10/29 with findings of perforation.  ??   She reports at home since discharge she has been having low grade temperatures. She has completed for course of antibiotics but believe the temperatures were going on while being on antibiotics.  Also reports that she had loose stool for a few days post op so stopped taking colace. She reports she has not had a BM since Sunday 11/3.  She Reports pain to RLQ. Denies incisional redness or drainage. Reports eating and drinking fine without nausea, vomiting however not eating a lot because she feels full.   Patient went home after being seen in clinic on 11/13 and returned for direct admission on 11/14.    Allergies    Dapsone; Penicillins; Rasburicase; Sulfasalazine; Vancomycin; Vancomycin analogues; Adhesive; Amoxicillin; Cefepime; Morphine; and Tapentadol      Medications       Current Facility-Administered Medications on File Prior to Encounter   Medication Dose Route Frequency Provider Last Rate Last Dose   ??? [COMPLETED] iohexol (OMNIPAQUE) 350 mg iodine/mL solution 100 mL  100 mL Intravenous Once in imaging [facility] [place], [place]   100 mL at 11/23/17 1429     Current Outpatient Medications on File Prior to Encounter   Medication Sig Dispense Refill   ??? acetaminophen (TYLENOL) 500 MG tablet Take 2 tablets (1,000 mg total) by mouth every eight (8) hours. (Patient not taking: Reported on 11/14/2017) 30 tablet 0   ??? [EXPIRED] clindamycin (CLEOCIN) 300 MG capsule Take 1 capsule (300 mg total) by mouth Three (3) times a day. for 5 days 15 capsule 0   ??? [EXPIRED] levoFLOXacin (LEVAQUIN) 500 MG tablet Take 1 tablet (500 mg total) by mouth daily for 4 days. 4 tablet 0   ??? [EXPIRED] traMADol (ULTRAM) 50 mg tablet Take 0.5 tablets (25 mg total) by mouth every four (4) hours as needed for up to 5 days. (Patient not taking: Reported on 11/14/2017) 10 tablet 0   ??? valACYclovir (VALTREX) 500 MG tablet TAKE 1 TABLET BY MOUTH ONCE DAILY. 30 each 99       Past Medical History    Past Medical History:   Diagnosis Date   ??? 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 AML (acute myelogenous leukemia) (CMS-HCC)    ??? 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 DVT (deep venous thrombosis) (CMS-HCC)    ???  Hypertension    ??? Osteoporosis     Stopped medications due to interactions with chemo drugs         Past Surgical History    Past Surgical History:   Procedure Laterality Date   ??? IR INSERT PORT AGE GREATER THAN 5 YRS  12/19/2015    IR INSERT PORT AGE GREATER THAN 5 YRS 12/19/2015 Ammie Dalton, MD IMG VIR MM MMNT   ??? PR LAP,APPENDECTOMY N/A 11/08/2017    Procedure: LAPAROSCOPY SURGICAL APPENDECTOMY;  Surgeon: Suella Broad, MD;  Location: MAIN OR [facility];  Service: General Surgery   ??? PR UPPER GI ENDOSCOPY,BIOPSY N/A 05/05/2016    Procedure: UGI ENDOSCOPY; WITH BIOPSY, SINGLE OR MULTIPLE;  Surgeon: Jolyn Lent, MD;  [facility];  Service: Gastroenterology   ??? SHOULDER SURGERY           Family History    The patient's family history includes Breast cancer in her mother; Cancer in her maternal aunt and paternal grandfather..      Social History:    Tobacco use: denies   Alcohol use: denies  Drug use: denies      Review of Systems  A 10-system review of systems was conducted and was negative except as documented above in the HPI.      Subjective:     Vital Signs    No data found.  No intake/output data recorded.      Physical Exam  General:awake, alert, afebrile, in NAD  Eyes: EOMI intact, Lids are atraumatic, no conjuctivitis  Ears, Nose, Mouth, Throat: mucous membranes are moist, hearing grossly intact  Neck:  Trachea is midline  Cardiovascular: Regular rate and rhythm, no murmurs,  no lower extremity edema  Chest:  NWOB on RA, CTA bilaterally   Abdomen:  soft, non distended. Pain to RLQ on exam. Incisions are c/d/i, no erythema, edema or ecchymosis noted.  No incisional drainage or incisional hernia noted. Staples removed today.   Musculoskeletal:  Warm and well perfused, normal gait noted  Skin: No rashes, abrasions, ecchymosis or lacerations  Neurologic: GCS 15, gross motor and sensation are intact  Psychiatric:  Patient oriented x3, memory intact, behaving appropriately      Test Results    All lab results last 24 hours:    Recent Results (from the past 24 hour(s))   Basic Metabolic Panel    Collection Time: 11/23/17 12:12 PM   Result Value Ref Range    Sodium 138 135 - 145 mmol/L    Potassium      Chloride 102 98 - 107 mmol/L    CO2 25.0 22.0 - 30.0 mmol/L    Anion Gap 11 7 - 15 mmol/L    BUN 11 7 - 21 mg/dL    Creatinine 1.61 0.96 - 1.00 mg/dL    BUN/Creatinine Ratio 15     EGFR CKD-EPI Non-African American, Female 89 >=60 mL/min/1.77m2    EGFR CKD-EPI African American, Female >90 >=60 mL/min/1.28m2    Glucose 92 65 - 179 mg/dL    Calcium 9.7 8.5 - 04.5 mg/dL   CBC    Collection Time: 11/23/17 12:12 PM   Result Value Ref Range    WBC 10.3 4.5 - 11.0 10*9/L    RBC 3.66 (L) 4.00 - 5.20 10*12/L    HGB 12.3 12.0 - 16.0 g/dL    HCT 40.9 81.1 - 91.4 %    MCV 101.1 (H) 80.0 - 100.0 fL    MCH 33.5 26.0 -  34.0 pg    MCHC 33.1 31.0 - 37.0 g/dL    RDW 16.1 09.6 - 04.5 %    MPV 7.4 7.0 - 10.0 fL    Platelet 436 150 - 440 10*9/L       Radiology Findings:  Ct Abdomen Pelvis With Contrast    Result Date: 11/23/2017  EXAM: CT ABDOMEN PELVIS W CONTRAST DATE: 11/23/2017 2:26 PM ACCESSION: [id number] UN DICTATED: 11/23/2017 2:33 PM INTERPRETATION LOCATION: [facility] CLINICAL INDICATION: S/p perforated appendicitis with pain, eval for intra - abdominal abscess  - R50.9 - Temperature elevated - R10.31 - RLQ abdominal pain - Z90.49 - S/P appendectomy  COMPARISON: CT of the abdomen and pelvis with contrast dated 11/08/2017. TECHNIQUE: A spiral CT scan of the abdomen and pelvis was obtained with IV contrast from the lung bases through the pubic symphysis. Images were reconstructed in the axial plane. Coronal and sagittal reformatted images were also provided for further evaluation. FINDINGS: LINES AND TUBES: None. LOWER THORAX: Lung bases are clear. Heart is normal in size. No pericardial effusion or thickening. HEPATOBILIARY: Unchanged, multifocal hepatic cysts. The gallbladder is present and otherwise unremarkable. No biliary dilatation.  SPLEEN: Unchanged splenic cyst measuring up to 1.7 cm. PANCREAS: Normal. ADRENALS: Normal. KIDNEYS/URETERS: Normal. BLADDER: Normal. PELVIC/REPRODUCTIVE ORGANS: Apparent enhancing round soft tissue lesion in the anterior uterine fundus (3:117), (6:67) measuring 1.6 x 2.1 x 2.0 cm (AP, TV, CC). In retrospect, this finding was present on 11/08/2017 examination. There is new interval dilation of the intrauterine cavity at the level of the fundus with internal hyperdense debris. This finding likely reflects a fibroid with intramural and submucosal components with blood products in the intrauterine cavity. No evidence of intrauterine air to suggest concomitant endometritis. GI TRACT: No dilated or thick walled loops of bowel. Appendix is surgically absent. PERITONEUM/RETROPERITONEUM AND MESENTERY: Rim-enhancing collection is noted in the right adnexum extending into the pelvic cul-de-sac/rectouterine space measuring approximately 8.1 x 8.0 x 4.2 cm (3:112, 5:54, 6:75). The collection abuts the mid and low rectum (6:69) and posterior aspect of the uterine body (6:16). No internal foci of air within the collection. LYMPH NODES: No pathologically enlarged lymph nodes. VESSELS: The aorta is normal in caliber.  No significant calcified atherosclerotic disease. The portal venous system is patent. The hepatic veins and IVC are unremarkable. BONES AND SOFT TISSUES: No lytic or blastic osseous lesion. Moderate endplate degenerative change at the L1 superior endplate.     -- Irregular 8.0 cm abscess extending from the right adnexum into the pouch of Douglas. -- Fibroid uterus with intramural and submucosal components. Interval distention of the fundal intrauterine cavity with blood products. Correlation with menopausal status and consideration of pelvic ultrasound is suggested. -- Surgically absent appendix.

## 2017-11-24 NOTE — Unmapped (Signed)
Pt admitted from home for appendectomy site abcess. Plan for drain placement bin VIR today. Will continue to monitor and with poc.

## 2017-11-24 NOTE — Unmapped (Signed)
VASCULAR INTERVENTIONAL RADIOLOGY  Drainage/Aspiration Consultation     Requesting Attending Physician: Christine Broad, MD  Service Requesting Consult: Christine [name])    Date of Service: 11/24/2017  Consulting Interventional Radiologist: Dr. Melynda [name]     HPI:     Procedure Requested: Drainage  of right hemipelvis abscess    Christine [name] is a 62 y.o. female with history of laparoscopic appendectomy on 10/29 with findings of perforation.  She has subsequently developed low-grade fevers.  Recent CT demonstrates a multiloculated abscess in the right pelvis measuring up to approximately 8 cm.      Medical History:     Past Medical History:  Past Medical History:   Diagnosis Date   ??? 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 AML (acute myelogenous leukemia) (CMS-HCC)    ??? 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 DVT (deep venous thrombosis) (CMS-HCC)    ??? Hypertension    ??? Osteoporosis     Stopped medications due to interactions with chemo drugs       Surgical History:  Past Surgical History:   Procedure Laterality Date   ??? IR INSERT PORT AGE GREATER THAN 5 YRS  12/19/2015    IR INSERT PORT AGE GREATER THAN 5 YRS 12/19/2015 Christine Dalton, MD IMG VIR MM MMNT   ??? PR LAP,APPENDECTOMY N/A 11/08/2017    Procedure: LAPAROSCOPY SURGICAL APPENDECTOMY;  Surgeon: Christine Broad, MD;  Location: MAIN OR [facility];  Service: General Surgery   ??? PR UPPER GI ENDOSCOPY,BIOPSY N/A 05/05/2016    Procedure: UGI ENDOSCOPY; WITH BIOPSY, SINGLE OR MULTIPLE;  Surgeon: Jolyn Lent, MD;  Location: [facility];  Service: Gastroenterology   ??? SHOULDER SURGERY         Family History:  Family History   Problem Relation Age of Onset   ??? Breast cancer Mother    ??? Cancer Paternal Grandfather         oral   ??? Cancer Maternal Aunt         unknown primary   ??? Asthma Neg Hx    ??? Allergic rhinitis Neg Hx    ??? Eczema Neg Hx        Medications:   Current Facility-Administered Medications   Medication Dose Route Frequency Provider Last Rate Last Dose   ??? acetaminophen (TYLENOL) tablet 650 mg  650 mg Oral Q6H PRN Christine [name], [name]       ??? dextrose 5 % and sodium chloride 0.45 % infusion  100 mL/hr Intravenous Continuous Christine [name], [name]       ??? docusate sodium (COLACE) capsule 100 mg  100 mg Oral BID Christine [name], [name]       ??? heparin (porcine) injection 5,000 Units  5,000 Units Subcutaneous Christine [name]       ??? levofloxacin (LEVAQUIN) 500 mg/100 mL IVPB 500 mg  500 mg Intravenous Q24H Christine [name], [name]       ??? metroNIDAZOLE (FLAGYL) IVPB 500 mg  500 mg Intravenous Q8H Christine [name], [name]       ??? polyethylene glycol (MIRALAX) packet 17 g  17 g Oral BID Christine [name], [name]  Allergies:  Dapsone; Penicillins; Rasburicase; Sulfasalazine; Vancomycin; Vancomycin analogues; Adhesive; Amoxicillin; Cefepime; Morphine; and Tapentadol    Social History:  Social History     Tobacco Use   ??? Smoking status: Never Smoker   ??? Smokeless tobacco: Never Used   Substance Use Topics   ??? Alcohol use: No   ??? Drug use: No       Objective:    Pertinent Laboratory Values:  WBC   Date Value Ref Range Status   11/23/2017 10.3 4.5 - 11.0 10*9/L Final   03/05/2016 <0.1 (LL) 4.5 - 11.0 10*9/L Final     Comment:     WBC Count Insufficient for precise Differential.      HGB   Date Value Ref Range Status   11/23/2017 12.3 12.0 - 16.0 g/dL Final     Hemoglobin   Date Value Ref Range Status   06/08/2017 10.9 (L) 12.0 - 16.0 g/dL Final     Comment:     Point of Care Testing performed at the point of care by trained personnel per documented policies.     HCT   Date Value Ref Range Status   11/23/2017 37.0 36.0 - 46.0 % Final Platelet   Date Value Ref Range Status   11/23/2017 436 150 - 440 10*9/L Final     INR   Date Value Ref Range Status   04/15/2016 1.22  Final     Creatinine   Date Value Ref Range Status   11/23/2017 0.72 0.60 - 1.00 mg/dL Final       Imaging Reviewed: CT abdomen pelvis from yesterday    Febrile:  Yes    Anticoaguation: No    Physical Exam:    Vitals:    11/24/17 0949   BP: 121/78   Pulse: 97   Resp: 18   Temp: 35.6 ??C (96.1 ??F)   SpO2: 96%     ASA Grade: ASA 2 - Patient with mild systemic disease with no functional limitations  General: No apparent distress.  Lungs: Breathing comfortably on RA.  Heart:  Regular rate and rhythm.  Neuro: No obvious focal deficits.    Airway assessment: Class 2 - Can visualize soft palate and fauces, tip of uvula is obscured    Assessment and Recommendations:     [name] is a 62 y.o. female with perforated appendicitis and evidence of abscess formation on imaging.      Recommendations:  - VIR does recommend proceeding with Drainage  of pelvic abscess.  The procedure will be performed with CT guidance.    - Anticipated procedure date: Today or tomorrow  - Please make NPO night prior to procedure  - Please ensure recent CBC, Creatinine, and INR are available    Informed Consent:  This procedure has been fully reviewed with the patient/patient???s authorized representative. The risks, benefits and alternatives have been explained, and the patient/patient???s authorized representative has consented to the procedure.  --The patient will accept blood products in an emergent situation.  --The patient does not have a Do Not Resuscitate order in effect.    The patient was discussed with Dr. Melynda [name].     Thank you for involving [place] in the care of this patient. Please page the VIR consult pager [phone]) with further questions, concerns, or if new issues arise.  [name] Kitchen

## 2017-11-24 NOTE — Unmapped (Signed)
[place] INTERVENTIONAL RADIOLOGY - Operative Note     VIR Post-Procedure Note    Procedure Name: Pelvic drain placement    Pre-Op Diagnosis: pelvic fluid collection    Post-Op Diagnosis: Same as pre-operative diagnosis    VIR Providers    Attending: Dr. Melynda Ripple  Assistant: Dorise Hiss Davionna Blacksher, MD    Description of procedure: Successful placement of right posterior approach 10 Fr pigtail drainage catheter into pelvic fluid collection. Approximately 4 ccs of brown purulent fluid sent for culture.    Estimated Blood Loss: approximately <5 mL  Complications: None    See detailed procedure note with images in PACS [facility]).    The patient tolerated the procedure well without incident or complication and left the room in stable condition.    Jaci Carrel, MD  11/24/2017 3:28 PM

## 2017-11-24 NOTE — Unmapped (Addendum)
Christine [name] 62 y.o. presented to clinic 11/13 s/p lap appendectomy on 10/29 with findings of perforation. ??Since discharge she reports low grade temperatures. CT AP 11/13 showed Irregular 8.0 cm abscess extending from the right adnexum into the pouch of Douglas. She was re-admitted to trauma surgery service on 11/14.      VIR was consulted and placed a drain on 11/14. She was started on IV Flagyl and IV Levaquin. Cultures with no growth. She was transitioned to oral antibiotics with plan to complete a total of 5 day course.     The procedure itself was uneventful and without complications. She tolerated the procedure well, was extubated in the OR, and received routine post-operative care before being transferred to the floor. Her diet was advanced and by discharge she was tolerating a regular diet. She was voiding adequately, ambulating independently, and her pain was controlled wth PO pain medications. She was examined by the Trauma Surgery team on the day of discharge and was deemed suitable for discharge home. She will be discharged on POD #1 in good condition.    I spent great than 30 minutes completing this discharge.

## 2017-11-24 NOTE — Unmapped (Signed)
Care Management  Initial Transition Planning Assessment    Lap Appy for perforation during 11/08/17 admission. Readmit with abscess formation post op. History Bone Marrow Transplant.               General  Care Manager assessed the patient by : Medical record review, Discussion with Clinical Care team  Orientation Level: Oriented X4  Who provides care at home?: N/A(Self care at baseline. Lives with husband who can assist as needed. )    Contact/Decision Maker        Extended Emergency Contact Information  Primary Emergency Contact: Swatzell,Paul  Address: [address]           [place], [place] [address] [place]    Mobile Phone: [phone]  Relation: Spouse    Legal Next of Kin / Guardian / POA / Advance Directives       Advance Directive (Medical Treatment)  Does patient have an advance directive covering medical treatment?: Patient does not have advance directive covering medical treatment.(1. Paul Quintanilla Husband 2. Shanda Bumps Sorrels daughter)  Information provided on advance directive:: No  Patient requests assistance:: No    Advance Directive (Mental Health Treatment)  Does patient have an advance directive covering mental health treatment?: Patient would like information.    Patient Information  Lives with: Spouse/significant other(Lives with husband)    Type of Residence: Private residence        Location/Detail: [place], [place]    Support Systems: Family Members, Children, Spouse    Responsibilities/Dependents at home?: No    Home Care services in place prior to admission?: No                  Equipment Currently Used at Home: none  Current HME Agency (Name/Phone #): [name] Kitchen    Currently receiving outpatient dialysis?: No       Financial Information   retired    Need for financial assistance?: No     Type of Residence: Mailing Address:  [address]  [place] [address]  Contacts:    Patient Phone Number: [phone]        Medical Provider(s): Allegra Grana, NP  Reason for Admission: Admitting Diagnosis:  abcess p/o lapectomy  Past Medical History:   has a past medical history of Allogeneic stem cell transplant (RAF-HCC) (02/24/2016), AML (acute myelogenous leukemia) (CMS-HCC), Cardiomyopathy (CMS-HCC), DVT (deep venous thrombosis) (CMS-HCC), Hypertension, and Osteoporosis.  Past Surgical History:   has a past surgical history that includes Shoulder surgery; IR Insert Port Age Greater Than 5 Years (12/19/2015); pr upper gi endoscopy,biopsy (N/A, 05/05/2016); and pr lap,appendectomy (N/A, 11/08/2017).   Previous admit date: 11/08/2017    Primary Insurance- Payor: BCBS / Plan: BCBS BLUE VALUE WITH [employer] / Product Type: *No Product type* /   Secondary Insurance ??? None  Prescription Coverage ??? [employer]  Preferred Pharmacy - [facility]  [facility] [phone] - [place], [place] - [address]  [facility]    Transportation home: Private vehicle  Level of function prior to admission: Independent      Social Determinants of Health  Social Determinants of Health were addressed in provider documentation.  Please refer to patient history.    Discharge Needs Assessment  Concerns to be Addressed: no discharge needs identified Nursing to teach drain care if drain placed.     Clinical Risk Factors: New Diagnosis    Barriers to taking medications: No  Prior overnight hospital stay or ED visit in last 90 days: Yes(Lap appy with perforation during admission 11/08/17)    Readmission Within the Last 30 Days: other (see comments)(complication from appendix perforation)         Anticipated Changes Related to Illness: none    Equipment Needed After Discharge: other (see comments)(Drain care supplies)    Discharge Facility/Level of Care Needs:      Readmission  Risk of Unplanned Readmission Score: UNPLANNED READMISSION SCORE: 14%  Readmitted Within the Last 30 Days? Yes  Patient at risk for readmission?: No    Discharge Plan  Screen findings are: Discharge planning needs identified or anticipated (Comment).    Expected Discharge Date: 11/27/17  Patient and/or family were provided with choice of facilities / services that are available and appropriate to meet post hospital care needs?: No       Initial Assessment complete?: Yes

## 2017-11-25 MED ORDER — OXYCODONE 5 MG TABLET
ORAL_TABLET | ORAL | 0 refills | 0 days | Status: CP | PRN
Start: 2017-11-25 — End: 2017-11-30
  Filled 2017-11-25: qty 5, 1d supply, fill #0

## 2017-11-25 MED ORDER — SODIUM CHLORIDE 0.9 % (FLUSH) INJECTION SYRINGE
Freq: Two times a day (BID) | INTRAVENOUS | 0 refills | 0.00000 days | Status: CP
Start: 2017-11-25 — End: 2017-12-13
  Filled 2017-11-25: qty 300, 15d supply, fill #0

## 2017-11-25 MED ORDER — METRONIDAZOLE 500 MG TABLET
ORAL_TABLET | Freq: Three times a day (TID) | ORAL | 0 refills | 0 days | Status: CP
Start: 2017-11-25 — End: 2017-11-30
  Filled 2017-11-25: qty 15, 5d supply, fill #0

## 2017-11-25 MED FILL — LEVOFLOXACIN 500 MG TABLET: 5 days supply | Qty: 5 | Fill #0 | Status: AC

## 2017-11-25 MED FILL — OXYCODONE 5 MG TABLET: 1 days supply | Qty: 5 | Fill #0 | Status: AC

## 2017-11-25 MED FILL — NORMAL SALINE FLUSH 0.9 % INJECTION SYRINGE: 15 days supply | Qty: 300 | Fill #0 | Status: AC

## 2017-11-25 MED FILL — METRONIDAZOLE 500 MG TABLET: 5 days supply | Qty: 15 | Fill #0 | Status: AC

## 2017-11-25 NOTE — Unmapped (Signed)
No acute events this shift. Progressing appropriately.  A/O x4.  VSS.  O2 sats maintained on RA.  Pain managed with Tylenol & Oxy PRN pain medication.  JP remains intact, teaching performed about care and flushing. IV AB continued.   All safety measures in place, remains free from falls, will continue to monitor closely.       Problem: Adult Inpatient Plan of Care  Goal: Plan of Care Review  Outcome: Progressing  Goal: Patient-Specific Goal (Individualization)  Outcome: Progressing  Goal: Absence of Hospital-Acquired Illness or Injury  Outcome: Progressing  Goal: Optimal Comfort and Wellbeing  Outcome: Progressing  Goal: Readiness for Transition of Care  Outcome: Progressing  Goal: Rounds/Family Conference  Outcome: Progressing

## 2017-11-25 NOTE — Unmapped (Signed)
Discharge Summary    Admit date: 11/24/2017    Discharge date and time: November 25, 2017 10:44 AM    Discharge to:  Home    Discharge Service: [facility])    Discharge Attending Physician: Suella Broad, MD    Discharge  Diagnoses: Intra-abdominal abscess s/p lap perforated appendectomy    Secondary Diagnosis: Active Problems:    * No active hospital problems. *  Resolved Problems:    * No resolved hospital problems. *      Ancillary Procedures: VIR drain 11/14    Discharge Day Services:   The patient was seen and examined by the Trauma Surgery team on the day of discharge. Vital signs and laboratory values were stable and within normal limits. Pain controlled after IR drain placed. Learned drain care for home. Tolerated oral antibiotics.  Discharge plan was discussed, instructions were given and all questions answered      Subjective   No acute events overnight.    Objective   Patient Vitals for the past 8 hrs:   BP Temp Temp src Pulse Resp SpO2   11/25/17 0445 103/62 36 ??C (96.8 ??F) Oral 94 16 96 %     I/O this shift:  In: -   Out: 10 [Drains:10]    Hospital Course:  Moncia Annas Lurz 62 y.o. presented to clinic 11/13 s/p lap appendectomy on 10/29 with findings of perforation. ??Since discharge she reports low grade temperatures. CT AP 11/13 showed Irregular 8.0 cm abscess extending from the right adnexum into the pouch of Douglas. She was re-admitted to trauma surgery service on 11/14.      VIR was consulted and placed a drain on 11/14. She was started on IV Flagyl and IV Levaquin. Cultures with no growth. She was transitioned to oral antibiotics with plan to complete a total of 5 day course.     The procedure itself was uneventful and without complications. She tolerated the procedure well, was extubated in the OR, and received routine post-operative care before being transferred to the floor. Her diet was advanced and by discharge she was tolerating a regular diet. She was voiding adequately, ambulating independently, and her pain was controlled wth PO pain medications. She was examined by the Trauma Surgery team on the day of discharge and was deemed suitable for discharge home. She will be discharged on POD #1 in good condition.    I spent great than 30 minutes completing this discharge.       Condition at Discharge: Improved  Discharge Medications:      Medication List      START taking these medications    metroNIDAZOLE 500 MG tablet  Commonly known as:  FLAGYL  Take 1 tablet (500 mg total) by mouth Three (3) times a day. for 5 days     oxyCODONE 5 MG immediate release tablet  Commonly known as:  ROXICODONE  Take 1 tablet (5 mg total) by mouth every four (4) hours as needed. for up   to 5 days     sodium chloride 0.9 % injection  Commonly known as:  NS  Infuse 10 mL into a venous catheter two (2) times a day. Infuse 10 mL into   drain two times daily        CHANGE how you take these medications    levoFLOXacin 500 MG tablet  Commonly known as:  LEVAQUIN  Take 1 tablet (500 mg total) by mouth daily. for 5 days  Start taking on:  November 26, 2017  What changed:  when to take this        CONTINUE taking these medications    acetaminophen 500 MG tablet  Commonly known as:  TYLENOL  Take 2 tablets (1,000 mg total) by mouth every eight (8) hours.     valACYclovir 500 MG tablet  Commonly known as:  VALTREX  TAKE 1 TABLET BY MOUTH ONCE DAILY.        STOP taking these medications    clindamycin 300 MG capsule  Commonly known as:  CLEOCIN     traMADol 50 mg tablet  Commonly known as:  ULTRAM          Pending Test Results: None    Discharge Instructions:  Activity:   Activity Instructions     Because you had sedation today, please follow these instructions:  ?? Rest for the next 24 hours  ?? Do not drive a car, operate heavy machinery, drink alcohol or sign legal documents for 24 hours.  ?? Be careful when standing and walking as you may feel dizzy or light headed.  ?? Avoid strenuous activity and heavy lifting (>10lbs) for 1 week.    Site care:  ?? Change gauze dressing and clean drain insertion site with mild soap and water at least every 3 days and as needed to keep site clean and dry.   ?? With tube in place, do not submerge site. No tub baths, swimming pools.      Flush drain with __5_ ml Saline (into body) ____twice_ daily.    Do the following steps to flush your drain:    ?? Turn the stopcock so that the Off handle points to the drainage bag/bulb.  ?? Wipe the side port with an alcohol wipe.  ?? Attach the syringe by pushing in and turning clockwise and inject the saline into the body.  ?? After injecting saline, turn the stopcock so that the Off handle points to the side port.  ?? Remove the syringe and throw it away.    Remember:  ?? Wash your hands before and after flushing.  ?? Only use a syringe ONCE and then throw it away.  ?? Be careful to wipe the side port and do not touch the tip of the syringe to anything else so that it does not get germs on it.    We will have your return to VIR usually around two weeks after the drain was placed to evaluate the drain.    Please call [facility] clinic at [phone] and choose option 2 to speak to a nurse for any problems or questions following discharge including:     ?? Bleeding that saturates bandage  ?? Pain at site not controlled by regular pain medicines  ?? Signs of infection at site: redness, swelling, pus draining, fever >101.5  ?? Any problems with drain- leaking, clogged, unable to flush, dislodged.  ?? If you have been discharged from the hospital without a follow-up appointment      Calls received during normal business hours (Monday through Friday from 8:00 am to 4:30 pm) will be returned within one business day. Calls received after normal business hours OR on weekends will be returned during the next business day.     If this is an after-hours urgent matter, you can call the Hospital front desk at [phone] and ask to speak with the Interventional Radiology (VIR) doctor on call.  If you are experiencing a medical emergency, please call 911  or go to your nearest emergency room.         For additional information, see attached Clinical Reference ???Biliary Drain care: General Info???, ???Surgical Drain Care???    Your follow up appointment is scheduled for:               Diet:    Other Instructions:    Labs and Other Follow-ups after Discharge:  Follow Up instructions and Outpatient Referrals     Discharge instructions      -You may shower but do not immerse your wounds in water.  When showering, keep your drain site covered with a waterproof bandage.    - Do not drive, operate heavy machinery, or drink alcohol while taking narcotics.    - Call the Trauma Surgery office [phone]) during business hours for questions, problems, appointments or for fever >101.10F by mouth, uncontrolled nausea or vomiting, pain uncontrolled with prescribed medication.  If you have problems at night or on the weekend that you feel are unable to wait please call [phone] and ask for the surgery resident on call.  This person is responding to many in-hospital emergencies and patients and may not respond to your phone call immediately.    - Dress wounds with dry gauze to avoid soiling clothing.    - Take all medications as directed.  It is especially important to complete your course of antibiotics (levofloxacin and Flagyl).    - Follow up with Primary Care Physician to inform him or her of the admission    - A follow-up appointment will be scheduled for you.  You will have a follow-up appointment with general surgery as well as interventional radiology.  Please call our office at the number above if you have not heard within 7 days regarding the date and time of your appointment.    -Flush your drain with 10 cc of sterile saline twice a day as directed by nursing until seen in follow-up.               Future Appointments:  Appointments which have been scheduled for you    Nov 29, 2017  9:15 AM EST  (Arrive by 9:00 AM)  ECHOCARDIOGRAM FOLLOW UP/LIMITED ECHO with MM ECHO RM 3  IMG ECHO [place] [facility]) [address]  [place] [address]  [phone]      Nov 29, 2017 10:05 AM EST  (Arrive by 9:50 AM)  RETURN CARDIO ONCOLOGY with Liborio Nixon, MD  [facility] HEART VASCULAR CTR CARDIO [place] [facility] REGION) [address]  [place] [address]  [phone]      Nov 30, 2017  9:45 AM EST  (Arrive by 9:15 AM)  BMT LAB with Carnella Guadalajara  [facility] BMT [place] [facility]) [address]  [place] [address]  [phone]      Nov 30, 2017 10:15 AM EST  (Arrive by 9:45 AM)  RETURN FOLLOW UP [place] with Artelia Laroche, MD  [facility] BMT [facility] REGION) [address]  [place] [address]  [phone]

## 2017-11-26 MED ORDER — LEVOFLOXACIN 500 MG TABLET
ORAL_TABLET | ORAL | 0 refills | 0.00000 days | Status: CP
Start: 2017-11-26 — End: 2017-11-30
  Filled 2017-11-25: qty 5, 5d supply, fill #0

## 2017-11-29 ENCOUNTER — Ambulatory Visit
Admit: 2017-11-29 | Discharge: 2017-11-29 | Payer: PRIVATE HEALTH INSURANCE | Attending: Cardiovascular Disease | Primary: Cardiovascular Disease

## 2017-11-29 ENCOUNTER — Ambulatory Visit: Admit: 2017-11-29 | Discharge: 2017-11-29 | Payer: PRIVATE HEALTH INSURANCE

## 2017-11-29 ENCOUNTER — Encounter: Payer: Self-pay | Admitting: Family

## 2017-11-29 DIAGNOSIS — I429 Cardiomyopathy, unspecified: Secondary | ICD-10-CM

## 2017-11-29 DIAGNOSIS — R000 Tachycardia, unspecified: Principal | ICD-10-CM

## 2017-11-29 NOTE — Unmapped (Signed)
Cardio-oncology Clinic Return Patient/Consult Note    Referring Provider: Benita Gutter  [facility]   Primary Provider: Allegra Grana, NP   [address] [facility]  [place] [address]       Reason for Visit:  Christine [name] is a 62 y.o. female who returns for ongoing evaluation and management of cardiomyopathy diagnosed in the setting of treatment for AML.    Assessment & Plan:  1. Cardiomyopathy  The etiology of her cardiomyopathy is not completely certain but given the timing of its onset seems very likely related to receiving daunorubicin. Ischemia could also be considered in a 62 year-old woman, but she has no other risk factors for CAD and no symptoms to suggest ischemia. Her ejection fraction improved steadily and now appears normal on today's echocardiogram.S he did not tolerate neurohormonal antagonists due to symptomatic hypotension. As such, and in light of the fact that she is asymptomatic with excellent functional status, I will not resume ACE inhibitor or beta blocker at this time.    Plan echocardiogram in 1 year and yearly thereafter.    2. Left bundle branch block  Transient during chemotherapy    3. Tachycardia  Her heart rate was recorded as 112 bpm. An EKG showed NSR at 84.    4. Acute myeloid leukemia  S/p SCT  Doing quite well with good therapeutic response. Ongoing management per Cancer Center team.    Return to clinic in 12 months for an echo, sooner if needed    History of Present Illness:  Christine [name] returns for ongoing evaluation and management of cardiomyopathy in the setting of treatment for AML. [name] has no history of heart disease of any sort. She always has been very physically active. Prior to diagnosis of AML she regularly walked 3 miles without any chest pain or shortness of breath. She underwent stem cell transplant on February 24, 2016. Today, [name] is feeling better since being hospitalized 3 weeks ago for acute appendicitis and is s/p appendecomy. She returned to the hospital one week ago for placement of second drain for an abscess. She completes a five day course of metronidazole and levofloxacin tomorrow. Her energy level and appetite continue to improve although are not yet back to baseline. Prior to her recent illness, she exercised six days per week including walking (3 miles), step aerobics and light strength exercises. She denies orthopnea, PND, abdominal fullness and LE edema. She denies chest pain, palpitations, presyncope and syncope.            Cardiovascular History & Procedures:  Cath / PCI:  None  CV Surgery:  None  EP Procedures and Devices:  None    Non-Invasive Evaluation(s):  Echo:  08/28/15  ?? Normal left ventricular systolic function, ejection fraction > 55%  ?? Dilated left atrium - mild  ?? Normal right ventricular systolic function  ?? No significant valvular abnormalities  ?? Degenerative mitral valve disease  ?? Mitral regurgitation - mild    11/04/15  ?? Left ventricular hypertrophy - mild  ?? EF estimated 45-50%, with septal dyskinesis likely due to new LBBB, this is a new finding compared to echo from 08/2015  ?? Diastolic dysfunction - grade II (elevated filling pressures)  ?? Degenerative mitral valve disease  ?? Dilated left atrium - mild  ?? Aortic sclerosis  ?? Normal right ventricular systolic function    12/03/15  ?? Limited study to evaluate ventricular function  ??  Dilated left ventricle - mild  ?? Moderately decreased left ventricular systolic function, ejection fraction 35%  ?? Diastolic dysfunction - grade I (normal filling pressures)  ?? Mitral regurgitation - mild  ?? Normal right ventricular systolic function  ?? Pericardial effusion - small    10/26/16  ?? Limited study to evaluate ventricular function  ?? Borderline left ventricular systolic function  ?? Normal right ventricular systolic function    CT/MRI/Nuclear Tests:  None       Past Medical History:   Diagnosis Date   ??? 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 AML (acute myelogenous leukemia) (CMS-HCC)    ??? 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 DVT (deep venous thrombosis) (CMS-HCC)    ??? Hypertension    ??? Osteoporosis     Stopped medications due to interactions with chemo drugs      Oncology History    Referring/Local Oncologist: none    Diagnosis: de novo AML with mutated FLT3    Genetics:   Karyotype/FISH: 47XX +11     Molecular Genetics: FLT3 ITD present; IDH2: c.419G>A [p.Arg140Gln]  ??  No variants detected in ASXL1, BCOR, CEBPA, DNMT3A, ETV6/TEL, EZH2, FLT3, IDH1, KIT, NPM1, NRAS, RUNX1, SF3B1, SRSF2, STAG2, TET2, TP53, U2AF1, WT1, ZRSR2    Pertinent Phenotypic data: blasts expressed CD4, CD11b, CD11c, CD33, CD34, CD64, CD117, and HLA-DR and lack expression of other informative analyzed markers including B and T-cell antigens.     Disease-specific prognostic estimation: ELN Adverse (per FLT3 allelic ratio high per estimate by molecular pathologist)          Acute myeloid leukemia (CMS-HCC)    08/29/2015 Initial Diagnosis     Acute myeloid leukemia (RAF-HCC)      09/02/2015 -  Chemotherapy     Induction therapy with daunorubicin 60 mg/m2 IV daily x 3, cytarabine 200 mg/m2/d CIV x 7 days, midostaurin 50 mg PO BID days 8-21      09/22/2015 Biopsy     50% cellular marrow with 50% blasts      09/25/2015 -  Chemotherapy     CLAG, cladribine 5 mg/m2/day days 1-5, cytarabine 2 grams/m2 IV days 1-5, GCSF days 0-5      10/23/2015 Remission     CR      11/03/2015 -  Chemotherapy     Cytarabine 1.5 g/m2 IV Q12 hrs days 1, 3, 5      11/04/2015 Adverse Reaction     Cardiomyopathy      11/28/2015 Adverse Reaction     Stenotrophomonas maltophilia CVAD infection      12/19/2015 -  Chemotherapy     Cytarabine 1.5 g/m2 IV Q12 hrs days 1, 3, 5      02/24/2016 Transplant     CTN 1301 (control arm) conditioning regimen with Busulfan and Fludarabine followed by matched sibling allo, received bone marrow product with cell dose of 3.18 x 10^6.            Past Surgical History:   Procedure Laterality Date   ??? IR INSERT PORT AGE GREATER THAN 5 YRS  12/19/2015    IR INSERT PORT AGE GREATER THAN 5 YRS 12/19/2015 Ammie Dalton, MD IMG VIR MM MMNT   ??? PR LAP,APPENDECTOMY N/A 11/08/2017  Procedure: LAPAROSCOPY SURGICAL APPENDECTOMY;  Surgeon: Suella Broad, MD;  Location: MAIN OR [facility];  Service: General Surgery   ??? PR UPPER GI ENDOSCOPY,BIOPSY N/A 05/05/2016    Procedure: UGI ENDOSCOPY; WITH BIOPSY, SINGLE OR MULTIPLE;  Surgeon: Jolyn Lent, MD;  Location: GI PROCEDURES [place] [facility];  Service: Gastroenterology   ??? SHOULDER SURGERY           Allergies:  Dapsone; Penicillins; Rasburicase; Sulfasalazine; Vancomycin; Vancomycin analogues; Adhesive; Amoxicillin; Cefepime; Morphine; and Tapentadol    Current Medications:  Current Outpatient Medications   Medication Sig Dispense Refill   ??? acetaminophen (TYLENOL) 500 MG tablet Take 2 tablets (1,000 mg total) by mouth every eight (8) hours. 30 tablet 0   ??? levoFLOXacin (LEVAQUIN) 500 MG tablet Take 1 tablet (500 mg total) by mouth daily for 5 days 5 tablet 0   ??? metroNIDAZOLE (FLAGYL) 500 MG tablet Take 1 tablet (500 mg total) by mouth Three (3) times a day for 5 days 15 tablet 0   ??? valACYclovir (VALTREX) 500 MG tablet TAKE 1 TABLET BY MOUTH ONCE DAILY. 30 each 99   ??? oxyCODONE (ROXICODONE) 5 MG immediate release tablet Take 1 tablet (5 mg total) by mouth every four (4) hours as needed (Patient not taking: Reported on 11/29/2017) 5 tablet 0   ??? sodium chloride (NS) 0.9 % injection Infuse 10 mls into catheter drain two times daily 300 mL 0     No current facility-administered medications for this visit.        Family History:  There is no family history of premature coronary artery disease or sudden cardiac death.     Social history:  Social History     Socioeconomic History   ??? Marital status: Married     Spouse name: None   ??? Number of children: None   ??? Years of education: None   ??? Highest education level: None   Occupational History   ??? None   Social Needs   ??? Financial resource strain: None   ??? Food insecurity:     Worry: None     Inability: None   ??? Transportation needs:     Medical: None     Non-medical: None   Tobacco Use   ??? Smoking status: Never Smoker   ??? Smokeless tobacco: Never Used   Substance and Sexual Activity   ??? Alcohol use: No   ??? Drug use: No   ??? Sexual activity: None   Lifestyle   ??? Physical activity:     Days per week: None     Minutes per session: None   ??? Stress: None   Relationships   ??? Social connections:     Talks on phone: None     Gets together: None     Attends religious service: None     Active member of club or organization: None     Attends meetings of clubs or organizations: None     Relationship status: None   Other Topics Concern   ??? 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      Highest level of education: high school       Review of Systems:  A full  review of 10 systems is unremarkable except as stated in the HPI.     Physical Exam:  VITAL SIGNS:   Vitals:    11/29/17 0926   BP: 100/70   Pulse: 112   SpO2: 98%      Wt Readings from Last 3 Encounters:   11/29/17 63.8 kg (140 lb 11.2 oz)   11/24/17 63.5 kg (140 lb)   11/23/17 63.5 kg (140 lb)      Today's Body mass index is 24.14 kg/m??.   I performed a physical exam today (11/29/17). It was unchanged and is documented accurately.  GENERAL: well-appearing in no acute distress  HEENT: Normocephalic and atraumatic with anicteric sclerae    NECK: Supple, without lymphadenopathy or thyromegaly. JVP flat. There are no carotid bruits CARDIOVASCULAR: Regular S1S2 without murmur or gallop  RESPIRATORY: Clear to auscultation without wheezes or rales.   ABDOMEN: Soft, non-tender, non-distended with audible bowel sounds. There is no organomegaly or palpable pulsatile mass.   EXTREMITIES:  Lower extremities are warm and without edema. Distal pulses are full and symmetric.   SKIN: No rashes, ecchymosis or petechiae.  NEURO: Alert, pleasant, and appropriate. Non-focal neuro exam    Pertinent Laboratory Studies:   Lab Results   Component Value Date    PRO-BNP 400.0 (H) 06/08/2017    PRO-BNP 1,120.0 (H) 11/04/2015    Creatinine 0.76 11/24/2017    Creatinine 0.72 11/23/2017    BUN 12 11/24/2017    BUN 11 11/23/2017    Potassium 4.5 11/24/2017    Potassium  11/23/2017      Comment:      Specimen hemolyzed.      Potassium, Bld 3.4 06/08/2017    Magnesium 2.1 11/24/2017    Magnesium 2.0 11/13/2017    AST 24 11/08/2017    AST 29 09/21/2017    ALT 17 11/08/2017    ALT 24 09/21/2017    Total Bilirubin 1.0 11/08/2017    Total Bilirubin 0.4 09/21/2017    INR 1.06 11/24/2017    INR 1.22 04/15/2016    WBC 10.0 11/24/2017    WBC <0.1 (LL) 03/05/2016    HGB 11.2 (L) 11/24/2017    Hemoglobin 10.9 (L) 06/08/2017    HCT 34.7 (L) 11/24/2017    Platelet 459 (H) 11/24/2017       Other pertinent records were reviewed.    Pertinent Test Results from Today:  Echo (prelim): EF 55-60%  EKG: NSR at 84

## 2017-11-29 NOTE — Unmapped (Signed)
Your heart function looks totally normal by both echocardiogram and EKG.  Keep up the good work with the exercise.    Please drop me a line if your heart rate is elevated (> 100 beats per minute) when you next see the BMT team.    See you in 1 year with an echocardiogram. Please let me know if any concerns arise prior to then.

## 2017-11-30 ENCOUNTER — Other Ambulatory Visit: Admit: 2017-11-30 | Discharge: 2017-12-01 | Payer: PRIVATE HEALTH INSURANCE

## 2017-11-30 ENCOUNTER — Ambulatory Visit: Admit: 2017-11-30 | Discharge: 2017-12-01 | Payer: PRIVATE HEALTH INSURANCE

## 2017-11-30 DIAGNOSIS — Z9481 Bone marrow transplant status: Secondary | ICD-10-CM

## 2017-11-30 DIAGNOSIS — K3533 Acute appendicitis with perforation and localized peritonitis, with abscess: Secondary | ICD-10-CM

## 2017-11-30 DIAGNOSIS — D899 Disorder involving the immune mechanism, unspecified: Secondary | ICD-10-CM

## 2017-11-30 DIAGNOSIS — C9201 Acute myeloblastic leukemia, in remission: Principal | ICD-10-CM

## 2017-11-30 LAB — COMPREHENSIVE METABOLIC PANEL
ALBUMIN: 3.6 g/dL (ref 3.5–5.0)
ALKALINE PHOSPHATASE: 113 U/L (ref 38–126)
ALT (SGPT): 13 U/L (ref <35)
ANION GAP: 9 mmol/L (ref 7–15)
AST (SGOT): 16 U/L (ref 14–38)
BILIRUBIN TOTAL: 0.4 mg/dL (ref 0.0–1.2)
BUN / CREAT RATIO: 13
CALCIUM: 9.7 mg/dL (ref 8.5–10.2)
CHLORIDE: 103 mmol/L (ref 98–107)
CO2: 29 mmol/L (ref 22.0–30.0)
CREATININE: 0.69 mg/dL (ref 0.60–1.00)
EGFR CKD-EPI AA FEMALE: >90 mL/min/{1.73_m2} (ref >=60)
EGFR CKD-EPI NON-AA FEMALE: >90 mL/min/{1.73_m2} (ref >=60)
GLUCOSE RANDOM: 97 mg/dL (ref 65–179)
PROTEIN TOTAL: 6.7 g/dL (ref 6.5–8.3)
SODIUM: 141 mmol/L (ref 135–145)

## 2017-11-30 LAB — CBC W/ AUTO DIFF
BASOPHILS ABSOLUTE COUNT: 0.1 10*9/L (ref 0.0–0.1)
BASOPHILS RELATIVE PERCENT: 0.7 %
EOSINOPHILS ABSOLUTE COUNT: 0.1 10*9/L (ref 0.0–0.4)
HEMATOCRIT: 34.2 % — ABNORMAL LOW (ref 36.0–46.0)
HEMOGLOBIN: 10.9 g/dL — ABNORMAL LOW (ref 12.0–16.0)
LARGE UNSTAINED CELLS: 2 % (ref 0–4)
LYMPHOCYTES RELATIVE PERCENT: 11.8 %
MEAN CORPUSCULAR HEMOGLOBIN CONC: 31.9 g/dL (ref 31.0–37.0)
MEAN CORPUSCULAR HEMOGLOBIN: 32.4 pg (ref 26.0–34.0)
MEAN CORPUSCULAR VOLUME: 101.3 fL — ABNORMAL HIGH (ref 80.0–100.0)
MEAN PLATELET VOLUME: 7 fL (ref 7.0–10.0)
MONOCYTES ABSOLUTE COUNT: 0.4 10*9/L (ref 0.2–0.8)
MONOCYTES RELATIVE PERCENT: 5.1 %
NEUTROPHILS ABSOLUTE COUNT: 5.7 10*9/L (ref 2.0–7.5)
PLATELET COUNT: 573 10*9/L — ABNORMAL HIGH (ref 150–440)
RED BLOOD CELL COUNT: 3.38 10*12/L — ABNORMAL LOW (ref 4.00–5.20)
RED CELL DISTRIBUTION WIDTH: 13.7 % (ref 12.0–15.0)
WBC ADJUSTED: 7.3 10*9/L (ref 4.5–11.0)

## 2017-11-30 LAB — NEUTROPHILS ABSOLUTE COUNT: Lab: 5.7

## 2017-11-30 LAB — ALBUMIN: Albumin:MCnc:Pt:Ser/Plas:Qn:: 3.6

## 2017-11-30 MED ORDER — METRONIDAZOLE 500 MG TABLET
ORAL_TABLET | Freq: Three times a day (TID) | ORAL | 0 refills | 0.00000 days | Status: CP
Start: 2017-11-30 — End: 2017-12-02
  Filled 2017-11-30: qty 27, 9d supply, fill #0

## 2017-11-30 MED ORDER — LEVOFLOXACIN 500 MG TABLET
ORAL_TABLET | ORAL | 0 refills | 0.00000 days | Status: CP
Start: 2017-11-30 — End: 2017-11-30

## 2017-11-30 MED ORDER — LEVOFLOXACIN 750 MG TABLET
ORAL_TABLET | Freq: Every day | ORAL | 0 refills | 0.00000 days | Status: CP
Start: 2017-11-30 — End: 2017-12-02
  Filled 2017-11-30: qty 9, 9d supply, fill #0

## 2017-11-30 MED FILL — METRONIDAZOLE 500 MG TABLET: 9 days supply | Qty: 27 | Fill #0 | Status: AC

## 2017-11-30 MED FILL — LEVOFLOXACIN 750 MG TABLET: 9 days supply | Qty: 9 | Fill #0 | Status: AC

## 2017-11-30 NOTE — Unmapped (Signed)
BMT Clinic Follow-up Note    Patient Name: Christine [name]  Medical Record Number:  [id number]  Encounter Date: 11/30/2017     BMT Attending MD:  Lanae Boast, MD.     HPI: Christine [name] is a 62 y.o. 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 1 yr 8 mo post transplant.    Tacrolimus stop date 06/15/16.      Interval History:  Christine [name] presents for follow up. She is s/p acute transmural appendicitis with perforation and acute serositis, s/p appendectomy on 11/08/17. This was complicated by formation of an abscess and placement of the IR drain on 11/14.     She received a course of oral antibiotics which she completed.     Today she presents for follow up. She is doing well. The drain seems to be working - draining small amount.   She denies fever, chills. Denies SOB. Denies CP.    She denies nausea, vomiting, or diarrhea. Denies abdominal pain.   She has no new rashes. She denies bleeding, bruising, or infectious symptoms. She is eating and drinking well.     PMH, PSH, and PSocH: Reviewed and there are no changes.     Allergies:  Dapsone; Penicillins; Rasburicase; Sulfasalazine; Vancomycin; Vancomycin analogues; Adhesive; Amoxicillin; Cefepime; Morphine; and Tapentadol   Medications: Reviewed and updated in EPIC.     Current Outpatient Medications:   ???  acetaminophen (TYLENOL) 500 MG tablet, Take 2 tablets (1,000 mg total) by mouth every eight (8) hours., Disp: 30 tablet, Rfl: 0  ???  sodium chloride (NS) 0.9 % injection, Infuse 10 mls into catheter drain two times daily, Disp: 300 mL, Rfl: 0  ???  valACYclovir (VALTREX) 500 MG tablet, TAKE 1 TABLET BY MOUTH ONCE DAILY., Disp: 30 each, Rfl: 99    Review of Systems:  A comprehensive ROS is negative except for as mentioned above in HPI.     Objective:  VS:BP 115/80  - Pulse 89  - Temp 36.7 ??C (98 ??F) (Oral)  - Resp 22  - Ht 162.6 cm (5' 4.02)  - Wt 64.1 kg (141 lb 6.4 oz)  - LMP  (LMP Unknown)  - SpO2 100%  - BMI 24.26 kg/m??     KPS: 80%, Normal activity with effort; some signs or symptoms of disease (ECOG equivalent 1)    Acute Graft versus Host Disease Assessment: 11/30/2017  Organ stage: 0  Skin stage 0, No Rash, none, 0 % BSA;   Upper GI stage 0, no nausea and no vomiting;   Lower GI stage 0, No Diarrhea;   Hepatic stage 0, Bilirubin normal - 2mg /dL    Overall grade (Current) None     Physical Exam:   General appearance: Christine [name] looks good. Although slightly pale and more fatigued than usual.    Head: Alopecia, wears wig. Normal, non-tender sinuses.  Eyes: EOMs intact, conjunctiva and sclera clear.   Throat slightly erythematous.   Neck: Supple, no palpable LAD  Lungs: CTAB. No wheezes, crackles, or rhonchi.   Heart: RRR. Normal s1/s2. No murmur, rub, or gallop.  Abdomen: Soft, non-tender, normoactive BS, no organomegaly.   Extremities: No edema. Pulses palpable in all extremities.   MSK: No joint deformity, tenderness, or swelling. FROM  Skin: Warm, dry, and intact. No rashes. The drain in place without evidence of infection.     Test Results:  Lab Results  Component Value Date    WBC 7.3 11/30/2017    HGB 10.9 (L) 11/30/2017    HCT 34.2 (L) 11/30/2017    PLT 573 (H) 11/30/2017       Lab Results   Component Value Date    NA 141 11/30/2017    K 4.9 11/30/2017    CL 103 11/30/2017    CO2 29.0 11/30/2017    BUN 9 11/30/2017    CREATININE 0.69 11/30/2017    GLU 97 11/30/2017    CALCIUM 9.7 11/30/2017    MG 2.1 11/24/2017    PHOS 4.0 11/24/2017       Lab Results   Component Value Date    BILITOT 0.4 11/30/2017    BILIDIR <0.10 04/15/2016    PROT 6.7 11/30/2017    ALBUMIN 3.6 11/30/2017    ALT 13 11/30/2017    AST 16 11/30/2017    ALKPHOS 113 11/30/2017    GGT 115 (H) 08/10/2016     ASSESSMENT / IMPRESSION:  **AML:??(FLT3-ITD, +11, and IDH2)??in CR prior to transplant.   06/22/16: BMBx:   - ??Normocellular bone marrow (40%) with trilineage hematopoiesis and 3% blasts.  - ??Routine cytogenetics: donor karyotype; FISH (trisomy 11) results are normal.  - ??Flow cytometric MRD results reveal no evidence of aberrant myeloid antigen expression or abnormal myeloblasts.  - ??DNA Chimerism see below.  - Considered maintenance with FLT3 inhibitor??(sorafenib). Was reluctant to start around D+45-60 due to pancytopenia and changing chimerism. Decided against it. Now she is 10 months after transplant. Discussed maintenance again - decided against it.   ??  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 ??  Conditioning:??  ????????????????????????Busulfan PK calculated from test dose 80 mg/hr daily and Fludarabine 40 mg/m2 daily ??day -5 thru day -2  ????????????????????????Day 0 -??02/24/16  Cell dose:??3.24 x10^8 TNC (Marrow)  Chimerisms/Marrow:  03/31/16: Day 30 marrow normocellular w/1% blasts. FISH and cytos are normal. MRD negative.   BM Chimerisms show 75% donor CD3 and >95% donor in unfractionated compartment.   04/22/16: PB Chimerisms show 71% donor CD3 and >95% donor unfractionated. 05/05/16: BM Chimerisms show 65% donor CD3 and >95% donor unfractionated.   05/18/16: PB Chimerisms show 74% donor CD3 and 95% donor unfractionated.   06/01/16: PB Chimerisms show 69% donor CD3 and >95% donor unfractionated.  06/22/16: PB Chimerisms show 80% donor CD3 and >95% donor unfractionated  06/22/16: BM Chimerisms show 79% donor CD3 and >95% donor unfractionated.   07/06/16: PB Chimerisms show 77% donor CD3 and >95% donor unfractionated.  07/13/16: Off tac x 4 weeks.??Chimerism check next visit.  If no full chimerism at 8-10 weeks after discontinuation of tac will consider DLI. We do not have any donor cells stored. If we need to recollect for DLI, will consider mobilizing the donor and collecting CD3+ cells for DLI as well as CD34+ cells for another transplant.   07/26/16: Off tac almost 6 weeks. PB Chimerisms show 74% donor in CD3 and >95% donor unfractionated.   08/10/16: Off tac 8 weeks. Repeat DNA shows slight increase in CD3 to 79%. Plan after discussion with Dr. Oswald Hillock to re-collect sister for more donor cells for DLI. However, today with??elevated LFTs, this is new, will need to follow these and make sure no GVHD prior to contacting sister for further donation. This was discussed with Christine [name] today.   08/18/16: Off tac 9 weeks.??PB Chimerisms show??85% donor CD3 and >95% donor unfractionated.  08/24/16: Off tac 10  weeks. BM Chimerisms show 79% donor CD3 and >95% donor unfractionated.  09/07/16: Off tac 12 weeks. PB Chimerisms show 91% donor CD3 and >95% donor unfractionated.??  09/28/16: PB chimerism 94% donor CD3 and >95% donor unfractionated.  10/26/16: PB chimerism 90% donor CD3 and >95% donor unfractionated.??  11/23/16: PB chimerism 95% donor CD3 and >95% donor unfractionated.??  12/22/16: PB chimerism 94% donor CD3 and >95% donor unfractionated.??  01/18/17: PB chimerism 93% donor CD3 and >95% donor unfractionated.??  02/15/17: PB chimerism 95% donor CD3 and >95% donor unfractionated.??  03/15/17: PB chimerism 95% donor CD3 and >95% donor unfractionated.??  04/12/17: PB chimerism 95% donor CD3 and >95% donor unfractionated.??  05/17/17: PB chimerism 95% donor CD3 and >95% donor unfractionated.??  06/21/17: PB chimerism >95% donor CD3 and >95% donor unfractionated.??  08/17/17:  PB chimerism >95% donor CD3 and >95% donor unfractionated.??  09/21/17: PB chimerism >95% donor CD3 and >95% donor unfractionated.??  11/30/17: PB chimerism 95% donor CD3 and >95% donor unfractionated.??    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   - Methotrexate on day +1, 3, 6, 11 (received all 4 doses)  - Tac tapered and eventually DC'd 06/15/16????  - ??Has had no e/o GVHD,developed new elevated LFTs noted at visit on 7/31. Liver US(08/18/16): 1.Patent hepatic vasculature with normal flow direction 2.Large simple hepatic cysts.3. Stone in gallbladder.   - 01/18/17: LFTs continue to improve.   - 11/30/17: LFT WNL.    ??  Heme:??CBC stable. Transfusion criteria: * Pre-med with tylenol and Zyrtec pre- blood or platelet transfusion.   - Hgb <8 and Plt <10K or bleeding.   - Granix if ANC <0.5.   ??  Hx of DVT:  - Completed 6 mos or anticoagulation.     **ID:??Afeb.   Acute appendicitis and serositis, complicated by abscess. Has a drain in place. Completed a few days of antibiotics prescribed by surgery.   Prophylaxis:   - Continue Valtrex for Zoster prophy 500 mg qd.   - Bactrim stopped due to pancytopenia. Allergic to dapsone. Switched to Pentamidine (Dose #1 - 5/8). Continues on monthly Pentamidine given low CD4 count.   - 07/12/17: Will DC pentamidine.   ??  Low CD4  - 06/30/16: Off IS. CD4 count 126.   - 09/07/16: CD4 count 137. ??  - 09/28/16: Received Pentamidine today. Plan to check CD4 level at next visit and continue with monthly pentamidine.   - 10/26/16: CD4 count 180. Continue monthly pentamidine.   - 11/23/16: CD4 count 167. Continue monthly pentamidine.   - 12/22/16: Continue monthly pentamidine (today).  - 01/19/16: Pentamidine due today.    - 02/15/17: Repeat CD4 count 188. Continue pentamidine.   - 03/15/17: Received dose today.   - 04/12/17: Received dose today.   - 05/17/17: CD4 count 216. Received pentamidine today. Will discuss restarting Bactrim DS on Sat and Sun.  - 06/21/17: Pentamidine today. After recovery from cold will discuss restarting Bactrim DS on Sat and Sun.  - 07/12/17: Will DC pentamidine.     Viral studies:  - 11/23/16: Viral panels have been negative. Will stop monitoring unless clinically indicated.    - 03/15/17: [facility] Respiratory virus panel positive for rhinovirus.     - 06/08/17: Parainfluenza pneumonia. IgG on 5/30 was 630.   - 07/12/17: Fully recovered.     Vaccine:   - 09/07/16: First set of post-transplant vaccines. Next set due in the end of February - next visit   Will also consider Shingrix - likely after her immunity  is a bit better.    - 03/15/17: Delay vaccines due to cold symptoms. Will plan on the next dose of post-transplant vaccines and Shingrix next visit.   - 04/12/17: Today 12 months post transplant vaccines. First dose of Shingrix vaccine. Next set in 10/2017.   ??  **CV:   Hx of anthracycline induced cardiomyopathy:  - Nov 2017 w/EF 35%. This has now resolved with 01/2016 Echo showing estimated EF of 50%.   - Treated with lisinopril and metoprolol (Metoprolol 50mg  XL).   - Now off all antihypertensives due to intolerance (hypotension).   - 03/15/17: BP WNL. Asymptomatic.   - 04/12/17: Patient reported that her PCP checked her cholesterol and it was borderline high 239. She is wondering if she should take a lipid lowering agents recommended by her PCP. I advised her to discuss this with Dr. Barbette Merino whom she is scheduled to see in a few months.   ??  HTN:??Lisinopril stopped d/t AKI.   - 04/07/16: D/C'd Norvasc. Due to asymptomatic hypotension. Pre-transplant blood pressures run in the 100/70-80 range per her report.   Per Dr. Barbette Merino, ok to stop metoprolol given low BPs with plan to repeat Echo in 6-8 weeks.  - 07/06/16: Baseline ECHO today showed EF of 45-50%. DC metoprolol.   - 07/13/16: Follow BP. Will repeat ECHO 8-12 weeks after stopping metoprolol. Repeat ECHO scheduled for 10/26/2016.  - 10/26/16: Repeat ECHO showed normal EF. Christine [name] saw Ramond Marrow who decided not to restart ACE inhibitor or beta blocker. Recommended annual follow up with ECHO of the heart.    ??  **GI/Hepatic:??  - Completed VOD prophylaxis.   - Elevated LFTs: Mild elevation at visit on 07/26/16. 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   --- 08/10/16 AST and ALT now 107/170 respectively. Normal amylase and lipase. No abdominal pain or GI symptoms.   --- 08/13/16 ????AST 191 and ALT 283. Alk phos 177.  --- 08/18/16 ??AST 144 and ALT 264. Alk phos 218. Liver US: with patent hepatic vasculature notable for large simple hepatic cyst and gallstones.  --- 09/07/16: AST 136 and ALT 242. Alk phos 197. Stable.   --- 09/28/16: AST 91 and ALT 159. Alk phos 128. LFTs continue to improve.   --- 10/26/16: AST 84 and ALT 139. Alk phos 114. LFTs continue to improve.   --- 11/23/16:  AST 64 and ALT 102. Alk phos 96. LFTs continue to improve.   --- 12/22/16:  AST 51 and ALT 72. Alk phos 79. LFTs continue to improve.   --- 01/18/17: AST 42 and ALT 53. Alk phos 75. LFTs continue to improve.    --- 02/15/17: AST 47 and ALT 53. Alk phos 71. Stable.   --- 06/21/17: LFT WNL.   --- 09/21/17: LFT WNL.   ??  **Bone Health:  - 03/31/16: DEXA scan showed osteoporosis of the spine and osteopenia of the femoral neck. Zometa and Ca/Vit D.   - 04/02/16: Received 1st dose of Zometa.   - 07/06/16. 2nd dose of Zometa. Completed. Continue Ca/Vit D supplement.   - 03/15/17: Patient received two doses of Zometa. Continue Ca/Vit D supplement. Vit D 38.9 WNL.   ??  Plan: Christine [name] is 1 yr 8 mo out of her allo-SCT. She has always been a full donor chimera is unfractionated compartment but a mixed chimera in CD3 compartment. The past three months though the CD3 chimerism has been >95%.    - Next set of vaccines 18 mo + second dose of Shingrix scheduled for 10/2017. Delayed  due to appendicitis.   - She has still drain in place. Will extend antibiotics until a week after removal of the drain. Christine [name] knows to call with any fevers chills or any GI complains.        Willaim Bane  11/30/2017  11:48 AM

## 2017-12-02 MED ORDER — METRONIDAZOLE 500 MG TABLET
ORAL_TABLET | Freq: Three times a day (TID) | ORAL | 0 refills | 0 days | Status: CP
Start: 2017-12-02 — End: 2017-12-16

## 2017-12-02 MED ORDER — LEVOFLOXACIN 750 MG TABLET
ORAL_TABLET | Freq: Every day | ORAL | 0 refills | 0 days | Status: CP
Start: 2017-12-02 — End: 2017-12-16

## 2017-12-05 NOTE — Unmapped (Signed)
Pre-procedure instructions completed. All questions answered. Instructed to take AM medications. Must have driver 18 years or older.

## 2017-12-07 ENCOUNTER — Ambulatory Visit: Admit: 2017-12-07 | Discharge: 2017-12-07 | Payer: PRIVATE HEALTH INSURANCE

## 2017-12-07 DIAGNOSIS — T8143XA Infection following a procedure, organ and space surgical site, initial encounter: Principal | ICD-10-CM

## 2017-12-07 NOTE — Unmapped (Signed)
[facility] - Operative Note     VIR Post-Procedure Note    Procedure Name: Sinogram    Pre-Op Diagnosis: Pelvic Abscess    Post-Op Diagnosis: Same as pre-operative diagnosis    VIR Providers    Attending: Wendy Poet, MD  Description of procedure: Catheter injection revealed no residual cavity. Line was pulled.    Estimated Blood Loss: approximately 0 mL  Complications: None    See detailed procedure note with images in PACS [facility]).    The patient tolerated the procedure well without incident or complication and left the room in stable condition.    Wendy Poet, MD [facility]  12/07/2017 1:54 PM

## 2017-12-12 NOTE — Unmapped (Signed)
Intra-abdominal Abscess        Admission: 11/24/17    Discharge: 11/25/17    Procedures:   11/24/17 IR drain into pelvic abscess      Pathology  Final Diagnosis   Date Value Ref Range Status   11/08/2017   Final    Appendix, appendectomy:  Acute transmural appendicitis with perforation and acute serositis    This electronic signature is attestation that the pathologist personally reviewed the submitted material(s) and the final diagnosis reflects that evaluation.          Has the patient been in the ER since D/C: No. Drain was discontinued on 12/07/17    S: Patient presents with h/o perforated appendicitis s/p lap appy and drain placement. Underwent sinogram and drain removed. Remains on abx; course lengthened per her transplant medicine doctor. Otherwise doing well. Denies fevers, chills, nausea, vomiting. Appetite still relatively low but improving. Weight stable. Normal bowel function. No abdominal pain.     O:   VSS - reviewed in Epic.   Abdomen - soft, NT, ND  Incisions healed    A/P:  History as above. Doing well. Remains on prolonged abx course per medicine team.   D/c from clinic; return if having problems.

## 2017-12-13 ENCOUNTER — Ambulatory Visit
Admit: 2017-12-13 | Discharge: 2017-12-14 | Payer: PRIVATE HEALTH INSURANCE | Attending: Surgical Critical Care | Primary: Surgical Critical Care

## 2017-12-13 DIAGNOSIS — K3589 Other acute appendicitis without perforation or gangrene: Principal | ICD-10-CM

## 2017-12-13 MED FILL — VALACYCLOVIR 500 MG TABLET: ORAL | 30 days supply | Qty: 30 | Fill #2

## 2017-12-13 MED FILL — VALACYCLOVIR 500 MG TABLET: 30 days supply | Qty: 30 | Fill #2 | Status: AC

## 2017-12-20 ENCOUNTER — Ambulatory Visit: Admit: 2017-12-20 | Discharge: 2017-12-21 | Payer: PRIVATE HEALTH INSURANCE

## 2017-12-20 ENCOUNTER — Other Ambulatory Visit: Admit: 2017-12-20 | Discharge: 2017-12-21 | Payer: PRIVATE HEALTH INSURANCE

## 2017-12-20 DIAGNOSIS — M810 Age-related osteoporosis without current pathological fracture: Secondary | ICD-10-CM

## 2017-12-20 DIAGNOSIS — Z9481 Bone marrow transplant status: Principal | ICD-10-CM

## 2017-12-20 DIAGNOSIS — D899 Disorder involving the immune mechanism, unspecified: Secondary | ICD-10-CM

## 2017-12-20 DIAGNOSIS — C9201 Acute myeloblastic leukemia, in remission: Secondary | ICD-10-CM

## 2017-12-20 DIAGNOSIS — Z9484 Stem cells transplant status: Principal | ICD-10-CM

## 2017-12-20 LAB — COMPREHENSIVE METABOLIC PANEL
ALBUMIN: 3.9 g/dL (ref 3.5–5.0)
ALKALINE PHOSPHATASE: 44 U/L (ref 38–126)
ALT (SGPT): 15 U/L (ref <35)
ANION GAP: 7 mmol/L (ref 7–15)
AST (SGOT): 26 U/L (ref 14–38)
BILIRUBIN TOTAL: 0.4 mg/dL (ref 0.0–1.2)
BLOOD UREA NITROGEN: 9 mg/dL (ref 7–21)
BUN / CREAT RATIO: 13
CALCIUM: 9.3 mg/dL (ref 8.5–10.2)
CHLORIDE: 104 mmol/L (ref 98–107)
CO2: 29 mmol/L (ref 22.0–30.0)
CREATININE: 0.7 mg/dL (ref 0.60–1.00)
EGFR CKD-EPI AA FEMALE: >90 mL/min/{1.73_m2} (ref >=60)
EGFR CKD-EPI NON-AA FEMALE: >90 mL/min/{1.73_m2} (ref >=60)
GLUCOSE RANDOM: 84 mg/dL (ref 65–179)
PROTEIN TOTAL: 6.8 g/dL (ref 6.5–8.3)
SODIUM: 140 mmol/L (ref 135–145)

## 2017-12-20 LAB — CBC W/ AUTO DIFF
BASOPHILS ABSOLUTE COUNT: 0 10*9/L (ref 0.0–0.1)
BASOPHILS RELATIVE PERCENT: 0.3 %
EOSINOPHILS ABSOLUTE COUNT: 0.2 10*9/L (ref 0.0–0.4)
HEMATOCRIT: 36 % (ref 36.0–46.0)
HEMOGLOBIN: 11.9 g/dL — ABNORMAL LOW (ref 12.0–16.0)
LARGE UNSTAINED CELLS: 2 % (ref 0–4)
LYMPHOCYTES ABSOLUTE COUNT: 0.9 10*9/L — ABNORMAL LOW (ref 1.5–5.0)
LYMPHOCYTES RELATIVE PERCENT: 20 %
MEAN CORPUSCULAR HEMOGLOBIN CONC: 33.1 g/dL (ref 31.0–37.0)
MEAN CORPUSCULAR HEMOGLOBIN: 33.2 pg (ref 26.0–34.0)
MEAN PLATELET VOLUME: 7 fL (ref 7.0–10.0)
MONOCYTES ABSOLUTE COUNT: 0.4 10*9/L (ref 0.2–0.8)
MONOCYTES RELATIVE PERCENT: 9 %
NEUTROPHILS ABSOLUTE COUNT: 2.8 10*9/L (ref 2.0–7.5)
NEUTROPHILS RELATIVE PERCENT: 65 %
RED BLOOD CELL COUNT: 3.58 10*12/L — ABNORMAL LOW (ref 4.00–5.20)
RED CELL DISTRIBUTION WIDTH: 14.2 % (ref 12.0–15.0)
WBC ADJUSTED: 4.3 10*9/L — ABNORMAL LOW (ref 4.5–11.0)

## 2017-12-20 LAB — EOSINOPHILS ABSOLUTE COUNT: Lab: 0.2

## 2017-12-20 LAB — EGFR CKD-EPI AA FEMALE: Lab: >90

## 2017-12-20 NOTE — Unmapped (Signed)
BMT Clinic Follow-up Note    Patient Name: Christine [name]  Medical Record Number:  [id number]  Encounter Date: 12/20/2017     BMT Attending MD:  Lanae Boast, MD.     HPI: Christine [name] is a 62 y.o. 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 1 yr 9 mo post transplant.    Tacrolimus stop date 06/15/16.      Interval History:  Christine [name] presents for follow up. Since she was last here she had her appendectomy drain removed on 12/07/17.  She was d/c from the surgery clinic.  She has a decreased appetite from the oral antibiotics she has been taking affecting her taste.  She has been doing well. She has not had any abd pain. Denies NVD.  She has no sx of chronic GVHD.  She has been going to the gym.  She is able to do any activity she wants to do without limitations.  She denies chest pain or SOB.  She is without infectious sx, fevers or chills.      PMH, PSH, and PSocH: Reviewed and there are no changes.     Allergies:  Dapsone; Penicillins; Rasburicase; Sulfasalazine; Vancomycin; Adhesive; Amoxicillin; Cefepime; Morphine; and Tapentadol   Medications: Reviewed and updated in EPIC.     Current Outpatient Medications:   ???  valACYclovir (VALTREX) 500 MG tablet, TAKE 1 TABLET BY MOUTH ONCE DAILY., Disp: 30 each, Rfl: 99  No current facility-administered medications for this visit.     Facility-Administered Medications Ordered in Other Visits:   ???  flu vacc qs2019-20 6mos up(PF) (FLUARIX, FLULAVAL, FLUZONE) 60 mcg (15 mcg x 4)/0.5 mL injection, , , ,   ???  varicella-zoster gE-AS01B (PF) (SHINGRIX) 50 mcg/0.5 mL injection, , , ,     Review of Systems:  A comprehensive ROS is negative except for as mentioned above in HPI.     Objective:  VS:BP 109/75  - Pulse 95  - Temp 36.6 ??C (97.9 ??F) (Oral)  - Resp 16  - Ht 162.6 cm (5' 4)  - Wt 63 kg (139 lb)  - LMP  (LMP Unknown)  - SpO2 99%  - BMI 23.86 kg/m??     KPS: 80%, Normal activity with effort; some signs or symptoms of disease (ECOG equivalent 1)    Acute Graft versus Host Disease Assessment: 12/20/2017  Organ stage: 0  Skin stage 0, No Rash, none, 0 % BSA;   Upper GI stage 0, no nausea and no vomiting;   Lower GI stage 0, No Diarrhea;   Hepatic stage 0, Bilirubin normal - 2mg /dL    Overall grade (Current) None     Physical Exam:   General appearance: Christine [name] alert and no distress   Head: Normocephalic, without obvious abnormality, atraumatic   Eyes: conjunctivae/corneas clear.   Mouth/Throat: no oral lesions or ulcerations noted; lips, mucosa, and tongue normal   Lungs: CTAB, on room air  Heart: regular rate and rhythm, S1, S2 normal, no murmur, click, rub or gallop   Abdomen: soft, non-tender; bowel sounds normal; no masses, no organomegaly   Extremities: extremities normal, atraumatic, no cyanosis or edema   Skin: No evidence of rash.   Neurologic: Grossly normal     Test Results:  Lab Results   Component Value Date    WBC 4.3 (L) 12/20/2017    HGB 11.9 (L) 12/20/2017    HCT 36.0  12/20/2017    PLT 304 12/20/2017       Lab Results   Component Value Date    NA 140 12/20/2017    K 4.8 12/20/2017    CL 104 12/20/2017    CO2 29.0 12/20/2017    BUN 9 12/20/2017    CREATININE 0.70 12/20/2017    GLU 84 12/20/2017    CALCIUM 9.3 12/20/2017    MG 2.1 11/24/2017    PHOS 4.0 11/24/2017       Lab Results   Component Value Date    BILITOT 0.4 12/20/2017    BILIDIR <0.10 04/15/2016    PROT 6.8 12/20/2017    ALBUMIN 3.9 12/20/2017    ALT 15 12/20/2017    AST 26 12/20/2017    ALKPHOS 44 12/20/2017    GGT 115 (H) 08/10/2016     ASSESSMENT / IMPRESSION:  **AML:??(FLT3-ITD, +11, and IDH2)??in CR prior to transplant.   06/22/16: BMBx:   - ??Normocellular bone marrow (40%) with trilineage hematopoiesis and 3% blasts.  - ??Routine cytogenetics: donor karyotype; FISH (trisomy 11) results are normal.  - ??Flow cytometric MRD results reveal no evidence of aberrant myeloid antigen expression or abnormal myeloblasts.  - ??DNA Chimerism see below.  - Considered maintenance with FLT3 inhibitor??(sorafenib). Was reluctant to start around D+45-60 due to pancytopenia and changing chimerism. Decided against it. Now she is 10 months after transplant. Discussed maintenance again - decided against it.   ??  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 ??  Conditioning:??  ????????????????????????Busulfan PK calculated from test dose 80 mg/hr daily and Fludarabine 40 mg/m2 daily ??day -5 thru day -2  ????????????????????????Day 0 -??02/24/16  Cell dose:??3.24 x10^8 TNC (Marrow)  Chimerisms/Marrow:  03/31/16: Day 30 marrow normocellular w/1% blasts. FISH and cytos are normal. MRD negative.   BM Chimerisms show 75% donor CD3 and >95% donor in unfractionated compartment.   04/22/16: PB Chimerisms show 71% donor CD3 and >95% donor unfractionated. 05/05/16: BM Chimerisms show 65% donor CD3 and >95% donor unfractionated.   05/18/16: PB Chimerisms show 74% donor CD3 and 95% donor unfractionated.   06/01/16: PB Chimerisms show 69% donor CD3 and >95% donor unfractionated.  06/22/16: PB Chimerisms show 80% donor CD3 and >95% donor unfractionated  06/22/16: BM Chimerisms show 79% donor CD3 and >95% donor unfractionated.   07/06/16: PB Chimerisms show 77% donor CD3 and >95% donor unfractionated.  07/13/16: Off tac x 4 weeks.??Chimerism check next visit.  If no full chimerism at 8-10 weeks after discontinuation of tac will consider DLI. We do not have any donor cells stored. If we need to recollect for DLI, will consider mobilizing the donor and collecting CD3+ cells for DLI as well as CD34+ cells for another transplant.   07/26/16: Off tac almost 6 weeks. PB Chimerisms show 74% donor in CD3 and >95% donor unfractionated.   08/10/16: Off tac 8 weeks. Repeat DNA shows slight increase in CD3 to 79%. Plan after discussion with Dr. Oswald Hillock to re-collect sister for more donor cells for DLI. However, today with??elevated LFTs, this is new, will need to follow these and make sure no GVHD prior to contacting sister for further donation. This was discussed with Pam today.   08/18/16: Off tac 9 weeks.??PB Chimerisms show??85% donor CD3 and >95% donor unfractionated.  08/24/16: Off tac 10 weeks. BM Chimerisms show 79% donor CD3 and >95% donor unfractionated.  09/07/16: Off tac 12 weeks. PB Chimerisms show 91% donor CD3  and >95% donor unfractionated.??  09/28/16: PB chimerism 94% donor CD3 and >95% donor unfractionated.  10/26/16: PB chimerism 90% donor CD3 and >95% donor unfractionated.??  11/23/16: PB chimerism 95% donor CD3 and >95% donor unfractionated.??  12/22/16: PB chimerism 94% donor CD3 and >95% donor unfractionated.??  01/18/17: PB chimerism 93% donor CD3 and >95% donor unfractionated.??  02/15/17: PB chimerism 95% donor CD3 and >95% donor unfractionated.??  03/15/17: PB chimerism 95% donor CD3 and >95% donor unfractionated.??  04/12/17: PB chimerism 95% donor CD3 and >95% donor unfractionated.??  05/17/17: PB chimerism 95% donor CD3 and >95% donor unfractionated.??  06/21/17: PB chimerism >95% donor CD3 and >95% donor unfractionated.??  08/17/17:  PB chimerism >95% donor CD3 and >95% donor unfractionated.??  09/21/17: PB chimerism >95% donor CD3 and >95% donor unfractionated.??  11/30/17: PB chimerism 95% donor CD3 and >95% donor unfractionated.??    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   - Methotrexate on day +1, 3, 6, 11 (received all 4 doses)  - Tac tapered and eventually DC'd 06/15/16????  - ??Has had no e/o GVHD,developed new elevated LFTs noted at visit on 7/31. Liver US(08/18/16): 1.Patent hepatic vasculature with normal flow direction 2.Large simple hepatic cysts.3. Stone in gallbladder.   - 01/18/17: LFTs continue to improve.   - 12/20/17: LFT WNL.      Heme:??CBC stable.   Transfusion criteria: * Pre-med with tylenol and Zyrtec pre- blood or platelet transfusion.   - Hgb <8 and Plt <10K or bleeding.   - Granix if ANC <0.5.   ??  Hx of DVT:  - Completed 6 mos or anticoagulation.     **ID:??Afeb.   Acute appendicitis and serositis, complicated by abscess. Has a drain in place. Completed a few days of antibiotics prescribed by surgery.   Prophylaxis:   - Continue Valtrex for Zoster prophy 500 mg qd.   - Bactrim stopped due to pancytopenia. Allergic to dapsone. Switched to Pentamidine (Dose #1 - 5/8). Continues on monthly Pentamidine given low CD4 count.   - 07/12/17: Will DC pentamidine.   - Vaccines as below.  ??  Low CD4  - 06/30/16: Off IS. CD4 count 126.   - 09/07/16: CD4 count 137. ??  - 09/28/16: Received Pentamidine today. Plan to check CD4 level at next visit and continue with monthly pentamidine.   - 10/26/16: CD4 count 180. Continue monthly pentamidine.   - 11/23/16: CD4 count 167. Continue monthly pentamidine.   - 12/22/16: Continue monthly pentamidine (today).  - 01/19/16: Pentamidine due today.    - 02/15/17: Repeat CD4 count 188. Continue pentamidine.   - 03/15/17: Received dose today.   - 04/12/17: Received dose today.   - 05/17/17: CD4 count 216. Received pentamidine today. Will discuss restarting Bactrim DS on Sat and Sun.  - 06/21/17: Pentamidine today. After recovery from cold will discuss restarting Bactrim DS on Sat and Sun.  - 07/12/17: Will DC pentamidine.     Viral studies:  - 11/23/16: Viral panels have been negative. Will stop monitoring unless clinically indicated.    - 03/15/17: [facility] Respiratory virus panel positive for rhinovirus.     - 06/08/17: Parainfluenza pneumonia. IgG on 5/30 was 630.   - 07/12/17: Fully recovered.     Vaccine:   - 09/07/16: First set of post-transplant vaccines. Next set due in the end of February - next visit   Will also consider Shingrix - likely after her immunity is a bit better.    - 03/15/17: Delay vaccines due to cold symptoms. Will  plan on the next dose of post-transplant vaccines and Shingrix next visit.   - 04/12/17: Today 12 months post transplant vaccines. First dose of Shingrix vaccine. Next set in 10/2017.   - 12/20/17:  Shingrix given today as well as flu shot.  She will have a 1 more month of valtrex before she is able to stop.     **CV:   Hx of anthracycline induced cardiomyopathy:  - Nov 2017 w/EF 35%. This has now resolved with 01/2016 Echo showing estimated EF of 50%.   - Treated with lisinopril and metoprolol (Metoprolol 50mg  XL).   - Now off all antihypertensives due to intolerance (hypotension).   - 03/15/17: BP WNL. Asymptomatic.   - 04/12/17: Patient reported that her PCP checked her cholesterol and it was borderline high 239. She is wondering if she should take a lipid lowering agents recommended by her PCP. I advised her to discuss this with Dr. Barbette Merino whom she is scheduled to see in a few months.   ??  HTN:??Lisinopril stopped d/t AKI.   - 04/07/16: D/C'd Norvasc. Due to asymptomatic hypotension. Pre-transplant blood pressures run in the 100/70-80 range per her report.   Per Dr. Barbette Merino, ok to stop metoprolol given low BPs with plan to repeat Echo in 6-8 weeks.  - 07/06/16: Baseline ECHO today showed EF of 45-50%. DC metoprolol.   - 07/13/16: Follow BP. Will repeat ECHO 8-12 weeks after stopping metoprolol. Repeat ECHO scheduled for 10/26/2016.  - 10/26/16: Repeat ECHO showed normal EF. Ms. Moralez saw Ramond Marrow who decided not to restart ACE inhibitor or beta blocker. Recommended annual follow up with ECHO of the heart.      ??  **GI/Hepatic:??  - Completed VOD prophylaxis.   - Elevated LFTs: Mild elevation at visit on 07/26/16. 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   --- 08/10/16 AST and ALT now 107/170 respectively. Normal amylase and lipase. No abdominal pain or GI symptoms.   --- 08/13/16 ????AST 191 and ALT 283. Alk phos 177.  --- 08/18/16 ??AST 144 and ALT 264. Alk phos 218. Liver US: with patent hepatic vasculature notable for large simple hepatic cyst and gallstones.  --- 09/07/16: AST 136 and ALT 242. Alk phos 197. Stable.   --- 09/28/16: AST 91 and ALT 159. Alk phos 128. LFTs continue to improve.   --- 10/26/16: AST 84 and ALT 139. Alk phos 114. LFTs continue to improve.   --- 11/23/16:  AST 64 and ALT 102. Alk phos 96. LFTs continue to improve.   --- 12/22/16:  AST 51 and ALT 72. Alk phos 79. LFTs continue to improve.   --- 01/18/17: AST 42 and ALT 53. Alk phos 75. LFTs continue to improve.    --- 02/15/17: AST 47 and ALT 53. Alk phos 71. Stable.   --- 06/21/17: LFT WNL.   --- 09/21/17: LFT WNL.   --- 12/20/17:  LFTs WNL.    ??  **Bone Health:  - 03/31/16: DEXA scan showed osteoporosis of the spine and osteopenia of the femoral neck. Zometa and Ca/Vit D.   - 04/02/16: Received 1st dose of Zometa.   - 07/06/16. 2nd dose of Zometa. Completed. Continue Ca/Vit D supplement.   - 03/15/17: Patient received two doses of Zometa. Continue Ca/Vit D supplement. Vit D 38.9 WNL.   ??  Plan:   Vaccines today.  F/U in 6 weeks.  Do dexa in the spring.  It will be 2 yrs since her last dexa.       Dimple Nanas  12/20/2017  12:56 PM

## 2017-12-20 NOTE — Unmapped (Addendum)
Things look great.    Ok to stop the antibiotic Friday.   I want to get a bone density scan in the spring.           Recent Results (from the past 24 hour(s))   DNA Fingerprinting, Post-Transplant,CD3,Blood    Collection Time: 12/20/17  9:57 AM   Result Value Ref Range    Collection Collected    Comprehensive Metabolic Panel    Collection Time: 12/20/17  9:57 AM   Result Value Ref Range    Sodium 140 135 - 145 mmol/L    Potassium 4.8 3.5 - 5.0 mmol/L    Chloride 104 98 - 107 mmol/L    CO2 29.0 22.0 - 30.0 mmol/L    BUN 9 7 - 21 mg/dL    Creatinine 1.61 0.96 - 1.00 mg/dL    BUN/Creatinine Ratio 13     EGFR CKD-EPI Non-African American, Female >90 >=60 mL/min/1.71m2    EGFR CKD-EPI African American, Female >90 >=60 mL/min/1.37m2    Glucose 84 65 - 179 mg/dL    Calcium 9.3 8.5 - 04.5 mg/dL    Albumin 3.9 3.5 - 5.0 g/dL    Total Protein 6.8 6.5 - 8.3 g/dL    Total Bilirubin 0.4 0.0 - 1.2 mg/dL    AST 26 14 - 38 U/L    ALT 15 <35 U/L    Alkaline Phosphatase 44 38 - 126 U/L    Anion Gap 7 7 - 15 mmol/L   CBC w/ Differential    Collection Time: 12/20/17  9:57 AM   Result Value Ref Range    WBC 4.3 (L) 4.5 - 11.0 10*9/L    RBC 3.58 (L) 4.00 - 5.20 10*12/L    HGB 11.9 (L) 12.0 - 16.0 g/dL    HCT 40.9 81.1 - 91.4 %    MCV 100.4 (H) 80.0 - 100.0 fL    MCH 33.2 26.0 - 34.0 pg    MCHC 33.1 31.0 - 37.0 g/dL    RDW 78.2 95.6 - 21.3 %    MPV 7.0 7.0 - 10.0 fL    Platelet 304 150 - 440 10*9/L    Neutrophils % 65.0 %    Lymphocytes % 20.0 %    Monocytes % 9.0 %    Eosinophils % 3.5 %    Basophils % 0.3 %    Absolute Neutrophils 2.8 2.0 - 7.5 10*9/L    Absolute Lymphocytes 0.9 (L) 1.5 - 5.0 10*9/L    Absolute Monocytes 0.4 0.2 - 0.8 10*9/L    Absolute Eosinophils 0.2 0.0 - 0.4 10*9/L    Absolute Basophils 0.0 0.0 - 0.1 10*9/L    Large Unstained Cells 2 0 - 4 %    Macrocytosis Moderate (A) Not Present

## 2017-12-20 NOTE — Unmapped (Signed)
Immunizations administered per orders. Patient provided with information regarding side effects and what symptoms to expect after vaccines. Patient verbalized understanding, time spent 5 minutes.

## 2018-01-30 ENCOUNTER — Other Ambulatory Visit: Payer: Self-pay

## 2018-01-30 ENCOUNTER — Ambulatory Visit
Admission: EM | Admit: 2018-01-30 | Discharge: 2018-01-30 | Disposition: A | Discharge status: Home / Self Care | Payer: BLUE CROSS/BLUE SHIELD | Attending: Family Medicine | Admitting: Family Medicine

## 2018-01-30 DIAGNOSIS — J029 Acute pharyngitis, unspecified: Secondary | ICD-10-CM | POA: Diagnosis not present

## 2018-01-30 DIAGNOSIS — B9789 Other viral agents as the cause of diseases classified elsewhere: Secondary | ICD-10-CM | POA: Diagnosis not present

## 2018-01-30 HISTORY — DX: Leukemia, unspecified not having achieved remission: C95.90

## 2018-01-30 LAB — RAPID STREP SCREEN (MED CTR MEBANE ONLY): Streptococcus, Group A Screen (Direct): NEGATIVE

## 2018-01-30 NOTE — ED Provider Notes (Signed)
[facility]    CSN: [id number] Arrival date & time: 01/30/18  1646     History   Chief Complaint Chief Complaint  Patient presents with  . Sore Throat    HPI Robyn Montgomery is a 63 y.o. female.   The history is provided by the patient.  Sore Throat   URI  Presenting symptoms: sore throat   Severity:  Moderate Onset quality:  Sudden Duration:  3 days Timing:  Constant Progression:  Worsening Chronicity:  New Relieved by:  Nothing Ineffective treatments:  OTC medications Associated symptoms: no sinus pain and no wheezing   Risk factors: sick contacts     Past Medical History:  Diagnosis Date  . Cancer ([place])    leukemia- post stem cell transplant  . Leukemia ([place])   . Osteoporosis   . Personal history of chemotherapy     Patient Active Problem List   Diagnosis Date Noted  . Gastritis 05/26/2016  . Secondary cardiomyopathy ([place]) 02/02/2016  . Screening for breast cancer 02/02/2016  . Acute myeloid leukemia ([place]) 08/26/2015  . Gout 08/25/2015  . Routine general medical examination at a health care facility 07/27/2014  . Osteoporosis 07/27/2014    Past Surgical History:  Procedure Laterality Date  . BREAST BIOPSY Left    mole removed above left nipple, no bx  . broken shoulder Right 2012   Plates and screws put in  . LIMBAL STEM CELL TRANSPLANT    . MOLE REMOVAL  1977   Removed from left breast  . SHOULDER SURGERY      OB History   No obstetric history on file.      Home Medications    Prior to Admission medications   Medication Sig Start Date End Date Taking? Authorizing Provider  Calcium Carb-Cholecalciferol 600-800 MG-UNIT CHEW Chew by mouth. 03/31/16   [provider]  lisinopril (PRINIVIL,ZESTRIL) 5 MG tablet  12/10/15   [provider]  metoprolol succinate (TOPROL-XL) 50 MG 24 hr tablet Take 100 mg by mouth. 01/13/16 02/12/16  [provider]  valACYclovir (VALTREX) 500 MG tablet TAKE 1 TABLET BY MOUTH  ONCE DAILY 02/12/16   [provider]    Family History Family History  Problem Relation Age of Onset  . Arthritis Mother   . Hypertension Mother   . Cancer Mother 40       Breast  . Osteoporosis Mother   . Breast cancer Mother 53  . Parkinsonism Father   . Heart disease Maternal Uncle   . Cancer Paternal Grandfather        Oral    Social History Social History   Tobacco Use  . Smoking status: Never Smoker  . Smokeless tobacco: Never Used  Substance Use Topics  . Alcohol use: Yes    Alcohol/week: 0.0 standard drinks    Comment: Rare  . Drug use: No     Allergies   Dapsone; Rasburicase; Sulfasalazine; Penicillins; Sulfa antibiotics; Vancomycin; Cefepime; Morphine; Tape; and Tapentadol   Review of Systems Review of Systems  HENT: Positive for sore throat. Negative for sinus pain.   Respiratory: Negative for wheezing.      Physical Exam Triage Vital Signs ED Triage Vitals  Enc Vitals Group     BP 01/30/18 1659 117/88     Pulse Rate 01/30/18 1659 87     Resp 01/30/18 1659 16     Temp 01/30/18 1659 98 F (36.7 C)     Temp Source 01/30/18 1659 Oral  SpO2 01/30/18 1659 100 %     Weight 01/30/18 1700 140 lb (63.5 kg)     Height 01/30/18 1700 5\' 4"  (1.626 m)     Head Circumference --      Peak Flow --      Pain Score 01/30/18 1700 2     Pain Loc --      Pain Edu? --      Excl. in [place]? --    No data found.  Updated Vital Signs BP 117/88 (BP Location: Left Arm)   Pulse 87   Temp 98 F (36.7 C) (Oral)   Resp 16   Ht 5\' 4"  (1.626 m)   Wt 63.5 kg   SpO2 100%   BMI 24.03 kg/m   Visual Acuity Right Eye Distance:   Left Eye Distance:   Bilateral Distance:    Right Eye Near:   Left Eye Near:    Bilateral Near:     Physical Exam Vitals signs and nursing note reviewed.  Constitutional:      General: She is not in acute distress.    Appearance: She is well-developed. She is not toxic-appearing or diaphoretic.  HENT:     Head:  Normocephalic and atraumatic.     Right Ear: Tympanic membrane, ear canal and external ear normal.     Left Ear: Tympanic membrane, ear canal and external ear normal.     Mouth/Throat:     Pharynx: Uvula midline. Posterior oropharyngeal erythema present. No oropharyngeal exudate.  Eyes:     General: No scleral icterus.       Right eye: No discharge.        Left eye: No discharge.     Conjunctiva/sclera: Conjunctivae normal.     Pupils: Pupils are equal, round, and reactive to light.  Neck:     Musculoskeletal: Normal range of motion and neck supple.     Thyroid: No thyromegaly.  Cardiovascular:     Rate and Rhythm: Normal rate and regular rhythm.     Heart sounds: Normal heart sounds.  Pulmonary:     Effort: Pulmonary effort is normal. No respiratory distress.     Breath sounds: Normal breath sounds. No wheezing or rales.  Lymphadenopathy:     Cervical: No cervical adenopathy.  Neurological:     Mental Status: She is alert.      UC Treatments / Results  Labs (all labs ordered are listed, but only abnormal results are displayed) Labs Reviewed  RAPID STREP SCREEN ([facility] ONLY)  CULTURE, GROUP A STREP [facility])    EKG None  Radiology No results found.  Procedures Procedures (including critical care time)  Medications Ordered in UC Medications - No data to display  Initial Impression / Assessment and Plan / UC Course  I have reviewed the triage vital signs and the nursing notes.  Pertinent labs & imaging results that were available during my care of the patient were reviewed by me and considered in my medical decision making (see chart for details).      Final Clinical Impressions(s) / UC Diagnoses   Final diagnoses:  Viral pharyngitis    ED Prescriptions    None     1. Lab result and diagnosis reviewed with patient 2. Recommend supportive treatment with otc analgesics, salt water gargles 3. Follow-up prn if symptoms worsen or don't  improve  Controlled Substance Prescriptions [place] Controlled Substance Registry consulted? Not Applicable   Norval Gable, MD 01/30/18 [phone]

## 2018-01-30 NOTE — ED Triage Notes (Signed)
Sore throat x past few days. Asking for strep test. States "my throat doesn't look right." Pain 2/10

## 2018-01-30 NOTE — Discharge Instructions (Signed)
Salt water gargles, tylenol/advil

## 2018-02-01 ENCOUNTER — Other Ambulatory Visit: Admit: 2018-02-01 | Discharge: 2018-02-01 | Payer: PRIVATE HEALTH INSURANCE

## 2018-02-01 ENCOUNTER — Ambulatory Visit: Admit: 2018-02-01 | Discharge: 2018-02-01 | Payer: PRIVATE HEALTH INSURANCE

## 2018-02-01 DIAGNOSIS — C9201 Acute myeloblastic leukemia, in remission: Secondary | ICD-10-CM

## 2018-02-01 DIAGNOSIS — J069 Acute upper respiratory infection, unspecified: Principal | ICD-10-CM

## 2018-02-01 DIAGNOSIS — Z9481 Bone marrow transplant status: Principal | ICD-10-CM

## 2018-02-01 LAB — COMPREHENSIVE METABOLIC PANEL
ALBUMIN: 4.4 g/dL (ref 3.5–5.0)
ALKALINE PHOSPHATASE: 87 U/L (ref 38–126)
ALT (SGPT): 24 U/L (ref <35)
ANION GAP: 8 mmol/L (ref 7–15)
AST (SGOT): 27 U/L (ref 14–38)
BILIRUBIN TOTAL: 0.5 mg/dL (ref 0.0–1.2)
BLOOD UREA NITROGEN: 8 mg/dL (ref 7–21)
BUN / CREAT RATIO: 12
CHLORIDE: 104 mmol/L (ref 98–107)
CO2: 27 mmol/L (ref 22.0–30.0)
CREATININE: 0.65 mg/dL (ref 0.60–1.00)
EGFR CKD-EPI AA FEMALE: >90 mL/min/{1.73_m2} (ref >=60)
EGFR CKD-EPI NON-AA FEMALE: >90 mL/min/{1.73_m2} (ref >=60)
GLUCOSE RANDOM: 89 mg/dL (ref 70–179)
PROTEIN TOTAL: 7.6 g/dL (ref 6.5–8.3)
SODIUM: 139 mmol/L (ref 135–145)

## 2018-02-01 LAB — RED BLOOD CELL COUNT: Lab: 4.04

## 2018-02-01 LAB — CBC W/ AUTO DIFF
BASOPHILS ABSOLUTE COUNT: 0 10*9/L (ref 0.0–0.1)
BASOPHILS RELATIVE PERCENT: 0.3 %
EOSINOPHILS ABSOLUTE COUNT: 0.1 10*9/L (ref 0.0–0.4)
EOSINOPHILS RELATIVE PERCENT: 2.4 %
HEMATOCRIT: 39.5 % (ref 36.0–46.0)
HEMOGLOBIN: 13.3 g/dL (ref 12.0–16.0)
LYMPHOCYTES ABSOLUTE COUNT: 0.8 10*9/L — ABNORMAL LOW (ref 1.5–5.0)
LYMPHOCYTES RELATIVE PERCENT: 17.4 %
MEAN CORPUSCULAR HEMOGLOBIN CONC: 33.7 g/dL (ref 31.0–37.0)
MEAN CORPUSCULAR VOLUME: 97.7 fL (ref 80.0–100.0)
MEAN PLATELET VOLUME: 7 fL (ref 7.0–10.0)
MONOCYTES ABSOLUTE COUNT: 0.3 10*9/L (ref 0.2–0.8)
MONOCYTES RELATIVE PERCENT: 6.9 %
NEUTROPHILS RELATIVE PERCENT: 69.1 %
PLATELET COUNT: 307 10*9/L (ref 150–440)
RED BLOOD CELL COUNT: 4.04 10*12/L (ref 4.00–5.20)
RED CELL DISTRIBUTION WIDTH: 14.2 % (ref 12.0–15.0)
WBC ADJUSTED: 4.5 10*9/L (ref 4.5–11.0)

## 2018-02-01 LAB — SODIUM: Sodium:SCnc:Pt:Ser/Plas:Qn:: 139

## 2018-02-01 NOTE — Unmapped (Addendum)
BMT Clinic Follow-up    Patient Name: Christine [name]  MRN: [id number]  Encounter date: 02/01/2018    Primary Care Provider: Allegra Grana, NP  BMT Attending MD:  Lanae Boast, MD.   ??  HPI: Christine [name] is a 63 y.o. 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 1 yr 11 mo post transplant.    Tacrolimus stop date 06/15/16.    ??  Interval History:  Ms. Winner presents today with her husband and sister. She has been doing fairly well over the past month. Since Sunday of this week she has had a sore throat and an enlarged gland in her neck. She went to Urgent Care on Monday and was tested for strep which was negative. She was told she has a viral infection and was advised supportive care. She has not been taking anything over the counter since then. Her symptoms have not improved or worsened since Monday. Denies any exposure to sick contacts. No associated fevers, rigors, nasal congestion, cough, phlegm, shortness of breath. She completed Valtrex last week. No nausea, vomiting, diarrhea. She continues to be able to perform all activities without any limitations.     Oncology History    Referring/Local Oncologist: none    Diagnosis: de novo AML with mutated FLT3    Genetics:   Karyotype/FISH: 47XX +11     Molecular Genetics: FLT3 ITD present; IDH2: c.419G>A [p.Arg140Gln]  ??  No variants detected in ASXL1, BCOR, CEBPA, DNMT3A, ETV6/TEL, EZH2, FLT3, IDH1, KIT, NPM1, NRAS, RUNX1, SF3B1, SRSF2, STAG2, TET2, TP53, U2AF1, WT1, ZRSR2    Pertinent Phenotypic data: blasts expressed CD4, CD11b, CD11c, CD33, CD34, CD64, CD117, and HLA-DR and lack expression of other informative analyzed markers including B and T-cell antigens.     Disease-specific prognostic estimation: ELN Adverse (per FLT3 allelic ratio high per estimate by molecular pathologist)          Acute myeloid leukemia (CMS-HCC)    08/29/2015 Initial Diagnosis Acute myeloid leukemia (RAF-HCC)      09/02/2015 -  Chemotherapy     Induction therapy with daunorubicin 60 mg/m2 IV daily x 3, cytarabine 200 mg/m2/d CIV x 7 days, midostaurin 50 mg PO BID days 8-21      09/22/2015 Biopsy     50% cellular marrow with 50% blasts      09/25/2015 -  Chemotherapy     CLAG, cladribine 5 mg/m2/day days 1-5, cytarabine 2 grams/m2 IV days 1-5, GCSF days 0-5      10/23/2015 Remission     CR      11/03/2015 -  Chemotherapy     Cytarabine 1.5 g/m2 IV Q12 hrs days 1, 3, 5      11/04/2015 Adverse Reaction     Cardiomyopathy      11/28/2015 Adverse Reaction     Stenotrophomonas maltophilia CVAD infection      12 /08/2015 -  Chemotherapy     Cytarabine 1.5 g/m2 IV Q12 hrs days 1, 3, 5      02/24/2016 Transplant     CTN 1301 (control arm) conditioning regimen with Busulfan and Fludarabine followed by matched sibling allo, received bone marrow product with cell dose of 3.18 x 10^6.        Patient Active Problem List   Diagnosis   ??? Acute deep vein thrombosis (DVT) of left lower extremity (CMS-HCC)   ??? Osteoporosis (RAF-HCC)   ??? Acute myeloid leukemia (  CMS-HCC)   ??? Heart failure (CMS-HCC)   ??? 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 LLE distal DVT  Chronic PE on 09/10/15 CTA   ??? Allogeneic stem cell transplant (RAF-HCC)   ??? Hypomagnesemia   ??? Immunocompromised state (CMS-HCC)   ??? Pancytopenia (CMS-HCC)   ??? Immunization due   ??? Pneumonia of both lower lobes due to infectious organism (CMS-HCC)   ??? 11/08/2017: Lap appendectomy fo Acute perforated appendicitis       Review of Systems:  A full system review was performed and was negative except as noted in the above interval history.    **Reviewed and updated past medical, surgical, social, and family history as appropriate.      Allergies   Allergen Reactions   ??? Dapsone      Relative G6PD deficiency (lvl=5.1, lower limit of normal is 5.3)   ??? Penicillins Rash and Hives   ??? Rasburicase      Relative G6PD deficiency (lvl=5.1, lower limit of normal is 5.3)   ??? Sulfasalazine Hives   ??? Vancomycin Other (See Comments)     Avoid if possible, but if needed: Red man syndrome- slow rate over 2 hours, benadryl and ranitidine premed   ??? Adhesive Rash     Uses mepore     ??? Amoxicillin Rash   ??? Cefepime Rash     Can trial again while taking with antihistamine medications/antihistamine premeds    06/10/17: Has successfully tolerated cefepime with benadryl 25 PO as premed    ??? Morphine Rash     Can trial again while taking with antihistamine medications/antihistamine premeds (fentanyl may be better alternative if possible)   ??? Tapentadol Rash     Uses mepore     Current Outpatient Medications   Medication Sig Dispense Refill   ??? valACYclovir (VALTREX) 500 MG tablet TAKE 1 TABLET BY MOUTH ONCE DAILY. 30 each 99     No current facility-administered medications for this visit.      Facility-Administered Medications Ordered in Other Visits   Medication Dose Route Frequency Provider Last Rate Last Dose   ??? flu vacc qs2019-20 6mos up(PF) (FLUARIX, FLULAVAL, FLUZONE) 60 mcg (15 mcg x 4)/0.5 mL injection            ??? varicella-zoster gE-AS01B (PF) (SHINGRIX) 50 mcg/0.5 mL injection              Acute Graft versus Host Disease Assessment: 02/01/2018  Organ stage: 0  Skin stage 0, No Rash, none, 0 % BSA;   Upper GI stage 0, no nausea and no vomiting;   Lower GI stage 0, No Diarrhea;   Hepatic stage 0, Bilirubin normal - 2mg /dL  ??  Overall grade (Current) None     Physical Exam  General : Mid aged female, well appearing, in no acute distress   Head: Normocephalic, atraumatic     ENT: Anicteric, Moist mucous membranes. Tonsillar erythema, right submandibular swelling   Cardiovascular: Pulse normal rate, regularity and rhythm. S1 and S2 normal  Lungs: Clear to auscultation bilaterally, without wheezes/crackles/rhonchi. Good air movement.   Skin: Warm, dry, intact. No rash noted.   Psychiatry: Alert and oriented to person, place, and time.   Abdomen : Normoactive bowel sounds, abdomen soft, non-tender   Extremeties: No edema.   Musculo Skeletal: Full range of motion in shoulder, elbow, hip knee, ankle, left hand and feet.  Neurologic: CNII-XII grossly intact. Normal strength and sensation throughout    Test Results:   Lab  Results   Component Value Date    WBC 4.5 02/01/2018    HGB 13.3 02/01/2018    HCT 39.5 02/01/2018    PLT 307 02/01/2018       Assessment/Plan:    **AML:??(FLT3-ITD, +11, and IDH2)??in CR prior to transplant.   06/22/16: BMBx:   - ??Normocellular bone marrow (40%) with trilineage hematopoiesis and 3% blasts.  - ??Routine cytogenetics: donor karyotype; FISH (trisomy 11) results are normal.  - ??Flow cytometric MRD results reveal no evidence of aberrant myeloid antigen expression or abnormal myeloblasts.  - ??DNA Chimerism see below.  - Considered??maintenance with FLT3 inhibitor??(sorafenib). Was reluctant to start around D+45-60 due to pancytopenia and changing chimerism. Decided against it. 10 months after transplant maintenance was discussed again - decided against it.   ??  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 ??  Conditioning:??  ????????????????????????Busulfan PK calculated from test dose 80 mg/hr daily and Fludarabine 40 mg/m2 daily ??day -5 thru day -2  ????????????????????????Day 0 -??02/24/16  Cell dose:??3.24 x10^8 TNC (Marrow)  Chimerisms/Marrow:  03/31/16: Day 30 marrow normocellular w/1% blasts. FISH and cytos are normal. MRD negative.   BM Chimerisms show 75% donor CD3 and >95% donor in unfractionated compartment.   04/22/16: PB Chimerisms show 71% donor CD3 and >95% donor unfractionated. 05/05/16: BM Chimerisms show 65% donor CD3 and >95% donor unfractionated.   05/18/16: PB Chimerisms show 74% donor CD3 and 95% donor unfractionated.   06/01/16: PB Chimerisms show 69% donor CD3 and >95% donor unfractionated.  06/22/16: PB Chimerisms show 80% donor CD3 and >95% donor unfractionated  06/22/16: BM Chimerisms show 79% donor CD3 and >95% donor unfractionated.   07/06/16: PB Chimerisms show 77% donor CD3 and >95% donor unfractionated.  07/13/16: Off tac x 4 weeks.??Chimerism check next visit.  If no full chimerism at 8-10 weeks after discontinuation of tac will consider DLI. We do not have any donor cells stored. If we need to recollect for DLI, will consider mobilizing the donor and collecting CD3+ cells for DLI as well as CD34+ cells for another transplant.   07/26/16: Off tac almost 6 weeks. PB Chimerisms show 74% donor in CD3 and >95% donor unfractionated.   08/10/16: Off tac 8 weeks. Repeat DNA shows slight increase in CD3 to 79%. Plan after discussion with Dr. Oswald Hillock to re-collect sister for more donor cells for DLI. However, today with??elevated LFTs, this is new, will need to follow these and make sure no GVHD prior to contacting sister for further donation. This was discussed with Pam today.   08/18/16: Off tac 9 weeks.??PB Chimerisms show??85% donor CD3 and >95% donor unfractionated.  08/24/16: Off tac 10 weeks. BM Chimerisms show 79% donor CD3 and >95% donor unfractionated.  09/07/16: Off tac 12 weeks. PB Chimerisms show 91% donor CD3 and >95% donor unfractionated.??  09/28/16: PB chimerism 94% donor CD3 and >95% donor unfractionated.  10/26/16: PB chimerism 90% donor CD3 and >95% donor unfractionated.??  11/23/16: PB chimerism 95% donor CD3 and >95% donor unfractionated.??  12/22/16: PB chimerism 94% donor CD3 and >95% donor unfractionated.??  01/18/17: PB chimerism 93% donor CD3 and >95% donor unfractionated.??  02/15/17: PB chimerism 95% donor CD3 and >95% donor unfractionated.??  03/15/17: PB chimerism 95% donor CD3 and >95% donor unfractionated.??  04/12/17: PB chimerism 95% donor CD3 and >95% donor unfractionated.??  05/17/17: PB chimerism 95% donor CD3 and >95% donor unfractionated.??  06/21/17: PB chimerism >95% donor CD3 and >95% donor  unfractionated.??  08/17/17:  PB chimerism >95% donor CD3 and >95% donor unfractionated.??  09/21/17: PB chimerism >95% donor CD3 and >95% donor unfractionated.??  11/30/17: PB chimerism 95% donor CD3 and >95% donor unfractionated.??  12/20/17: PB chimerism 95% donor CD3 and >95% donor unfractionated.??    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   - Methotrexate on day +1, 3, 6, 11 (received all 4 doses)  - Tac tapered and eventually DC'd 06/15/16????  - ??Has had no e/o GVHD,developed new elevated LFTs noted at visit on 7/31. Liver US(08/18/16): 1.Patent hepatic vasculature with normal flow direction 2.Large simple hepatic cysts.3. Stone in gallbladder.   - 01/18/17: LFTs continue to improve.   - 12/20/17: LFT WNL.   - 02/01/18: LFT WNL  ??  Heme:??CBC stable.   Transfusion criteria: * Pre-med with tylenol and Zyrtec pre- blood or platelet transfusion.   - Hgb <8 and Plt <10K or bleeding.   - Granix if ANC <0.5.   ??  Hx of DVT:  - Completed 6 mos or anticoagulation.     **ID:??Afeb.   Sore Throat  - Will check respiratory panel today     Acute appendicitis and serositis, complicated by abscess. Has a drain in place. Completed a few days of antibiotics prescribed by surgery.   Prophylaxis:   - Continue Valtrex for Zoster prophy 500 mg qd.   - Bactrim stopped due to pancytopenia. Allergic to dapsone. Switched to Pentamidine (Dose #1 - 5/8). Continues on monthly Pentamidine given low CD4 count.   - 07/12/17: Will DC pentamidine.   - Vaccines as below.  ??  Low CD4  - 06/30/16: Off IS. CD4 count 126.   - 09/07/16: CD4 count 137. ??  - 09/28/16: Received Pentamidine today. Plan to check CD4 level at next visit and continue with monthly pentamidine.   - 10/26/16: CD4 count 180. Continue monthly pentamidine.   - 11/23/16: CD4 count 167. Continue monthly pentamidine.   - 12/22/16: Continue monthly pentamidine (today).  - 01/19/16: Pentamidine due today.    - 02/15/17: Repeat CD4 count 188. Continue pentamidine.   - 03/15/17: Received dose today.   - 04/12/17: Received dose today.   - 05/17/17: CD4 count 216. Received pentamidine today. Will discuss restarting Bactrim DS on Sat and Sun.  - 06/21/17: Pentamidine today. After recovery from cold will discuss restarting Bactrim DS on Sat and Sun.  - 07/12/17: Will DC pentamidine.   ??  Viral studies:  - 11/23/16: Viral panels have been negative. Will stop monitoring unless clinically indicated.    - 03/15/17: [facility] Respiratory virus panel positive for rhinovirus.     - 06/08/17: Parainfluenza pneumonia. IgG on 5/30 was 630.   - 07/12/17: Fully recovered.   ??  Vaccine:   - 09/07/16: First set of post-transplant vaccines. Next set due in the end of February - next visit   Will also consider Shingrix - likely after her immunity is a bit better.    - 03/15/17: Delay vaccines due to cold symptoms. Will plan on the next dose of post-transplant vaccines and Shingrix next visit.   - 04/12/17: Today 12 months post transplant vaccines. First dose of Shingrix vaccine. Next set in 10/2017.   - 12/20/17:  Shingrix given today as well as flu shot.   - 02/01/18: Valtrex completed last week. S/P Shingrix X 2. No further Valtrex needed.     **CV:   Hx of anthracycline induced cardiomyopathy:  - Nov 2017 w/EF 35%. This has now resolved with 01/2016 Echo  showing estimated EF of 50%.   - Treated with lisinopril and metoprolol (Metoprolol 50mg  XL).   - Now off all antihypertensives due to intolerance (hypotension).   - 03/15/17: BP WNL. Asymptomatic.   - 04/12/17: Patient reported that her PCP checked her cholesterol and it was borderline high 239. She is wondering if she should take a lipid lowering agents recommended by her PCP. I advised her to discuss this with Dr. Barbette Merino whom she is scheduled to see in a few months.   ??  HTN:??Lisinopril stopped d/t AKI.   - 04/07/16: D/C'd Norvasc. Due to asymptomatic hypotension. Pre-transplant blood pressures run in the 100/70-80 range per her report.   Per Dr. Barbette Merino, ok to stop metoprolol given low BPs with plan to repeat Echo in 6-8 weeks.  - 07/06/16: Baseline ECHO today showed EF of 45-50%. DC metoprolol.   - 07/13/16: Follow BP. Will repeat ECHO 8-12 weeks after stopping metoprolol. Repeat ECHO scheduled for 10/26/2016.  - 10/26/16: Repeat ECHO showed normal EF. Ms. Robotham saw Ramond Marrow who decided not to restart ACE inhibitor or beta blocker. Recommended annual follow up with ECHO of the heart.    ??  ??  **GI/Hepatic:??  - Completed VOD prophylaxis.   - Elevated LFTs:??Mild elevation at visit on 07/26/16. 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   ---??08/10/16 AST and ALT now 107/170 respectively. Normal amylase and lipase. No abdominal pain or GI symptoms.   ---??08/13/16 ????AST 191 and ALT 283. Alk phos 177.  ---??08/18/16 ??AST 144 and ALT 264. Alk phos 218. Liver US: with patent hepatic vasculature notable for large simple hepatic cyst and gallstones.  ---??09/07/16: AST 136 and ALT 242. Alk phos 197. Stable.   ---??09/28/16: AST 91 and ALT 159. Alk phos 128. LFTs continue to improve.   --- 10/26/16: AST 84 and ALT 139. Alk phos 114. LFTs continue to improve.   --- 11/23/16:  AST 64 and ALT 102. Alk phos 96. LFTs continue to improve.   --- 12/22/16:  AST 51 and ALT 72. Alk phos 79. LFTs continue to improve.   --- 01/18/17: AST 42 and ALT 53. Alk phos 75. LFTs continue to improve.    --- 02/15/17: AST 47 and ALT 53. Alk phos 71. Stable.   --- 06/21/17: LFT WNL.   --- 09/21/17: LFT WNL.   --- 12/20/17:  LFTs WNL.  --- 02/01/2018: LFTs WNL  ??  **Bone Health:  - 03/31/16: DEXA scan showed osteoporosis of the spine and osteopenia of the femoral neck. Zometa and Ca/Vit D.   - 04/02/16: Received 1st dose of Zometa.   - 07/06/16. 2nd dose of Zometa. Completed. Continue Ca/Vit D supplement.   - 03/15/17: Patient received two doses of Zometa. Continue Ca/Vit D supplement. Vit D 38.9 WNL.   ??  Plan:   - Will check respiratory panel and rapid strep test, will call patient if any positive results   - Follow up in 4 weeks with labs  - DEXA due in Spring, it will be 2 years since last DEXA scan     I saw the patient with the Resident. I discussed the findings, assessment and plan with the Resident and agree with the findings and plan as documented in the Resident's note. I edited above note.   Lianna is doing well. She has some sore throat but repeat rapid strep test is negative and respiratory virus panel is also negative. Will continue supportive care. She knows to call with worsening of symptoms.   CBC and chimerism  stable.     Lanae Boast, MD      Elpidio Anis, MD  [facility] Mar[facility]

## 2018-02-01 NOTE — Unmapped (Signed)
NP and strep swabs sent to micro lab.

## 2018-02-02 LAB — CULTURE, GROUP A STREP (THRC)

## 2018-03-01 ENCOUNTER — Ambulatory Visit
Admit: 2018-03-01 | Discharge: 2018-03-01 | Payer: PRIVATE HEALTH INSURANCE | Attending: Nurse Practitioner | Primary: Nurse Practitioner

## 2018-03-01 ENCOUNTER — Other Ambulatory Visit: Admit: 2018-03-01 | Discharge: 2018-03-01 | Payer: PRIVATE HEALTH INSURANCE

## 2018-03-01 ENCOUNTER — Ambulatory Visit: Admit: 2018-03-01 | Discharge: 2018-03-01 | Payer: PRIVATE HEALTH INSURANCE

## 2018-03-01 DIAGNOSIS — Z9484 Stem cells transplant status: Secondary | ICD-10-CM

## 2018-03-01 DIAGNOSIS — C9201 Acute myeloblastic leukemia, in remission: Secondary | ICD-10-CM

## 2018-03-01 DIAGNOSIS — D899 Disorder involving the immune mechanism, unspecified: Secondary | ICD-10-CM

## 2018-03-01 DIAGNOSIS — Z23 Encounter for immunization: Principal | ICD-10-CM

## 2018-03-01 DIAGNOSIS — Z9481 Bone marrow transplant status: Principal | ICD-10-CM

## 2018-03-01 DIAGNOSIS — E7800 Pure hypercholesterolemia, unspecified: Secondary | ICD-10-CM | POA: Diagnosis not present

## 2018-03-01 DIAGNOSIS — T451X5D Adverse effect of antineoplastic and immunosuppressive drugs, subsequent encounter: Secondary | ICD-10-CM | POA: Diagnosis not present

## 2018-03-01 DIAGNOSIS — Z888 Allergy status to other drugs, medicaments and biological substances status: Secondary | ICD-10-CM | POA: Diagnosis not present

## 2018-03-01 DIAGNOSIS — Z880 Allergy status to penicillin: Secondary | ICD-10-CM | POA: Diagnosis not present

## 2018-03-01 DIAGNOSIS — I427 Cardiomyopathy due to drug and external agent: Secondary | ICD-10-CM | POA: Diagnosis not present

## 2018-03-01 DIAGNOSIS — K7689 Other specified diseases of liver: Secondary | ICD-10-CM | POA: Diagnosis not present

## 2018-03-01 DIAGNOSIS — Z6824 Body mass index (BMI) 24.0-24.9, adult: Secondary | ICD-10-CM | POA: Diagnosis not present

## 2018-03-01 DIAGNOSIS — M810 Age-related osteoporosis without current pathological fracture: Secondary | ICD-10-CM | POA: Diagnosis not present

## 2018-03-01 DIAGNOSIS — Z87448 Personal history of other diseases of urinary system: Secondary | ICD-10-CM | POA: Diagnosis not present

## 2018-03-01 DIAGNOSIS — Z86718 Personal history of other venous thrombosis and embolism: Secondary | ICD-10-CM | POA: Diagnosis not present

## 2018-03-01 DIAGNOSIS — M85859 Other specified disorders of bone density and structure, unspecified thigh: Secondary | ICD-10-CM | POA: Diagnosis not present

## 2018-03-01 DIAGNOSIS — Z9221 Personal history of antineoplastic chemotherapy: Secondary | ICD-10-CM | POA: Diagnosis not present

## 2018-03-01 DIAGNOSIS — Z885 Allergy status to narcotic agent status: Secondary | ICD-10-CM | POA: Diagnosis not present

## 2018-03-01 DIAGNOSIS — K8020 Calculus of gallbladder without cholecystitis without obstruction: Secondary | ICD-10-CM | POA: Diagnosis not present

## 2018-03-01 DIAGNOSIS — I10 Essential (primary) hypertension: Secondary | ICD-10-CM | POA: Diagnosis not present

## 2018-03-01 DIAGNOSIS — Z9049 Acquired absence of other specified parts of digestive tract: Secondary | ICD-10-CM | POA: Diagnosis not present

## 2018-03-01 LAB — CBC W/ AUTO DIFF
BASOPHILS ABSOLUTE COUNT: 0 10*9/L (ref 0.0–0.1)
EOSINOPHILS ABSOLUTE COUNT: 0.2 10*9/L (ref 0.0–0.4)
EOSINOPHILS RELATIVE PERCENT: 4.2 %
HEMATOCRIT: 42 % (ref 36.0–46.0)
HEMOGLOBIN: 14 g/dL (ref 12.0–16.0)
LARGE UNSTAINED CELLS: 4 % (ref 0–4)
LYMPHOCYTES ABSOLUTE COUNT: 1 10*9/L — ABNORMAL LOW (ref 1.5–5.0)
LYMPHOCYTES RELATIVE PERCENT: 27 %
MEAN CORPUSCULAR HEMOGLOBIN CONC: 33.4 g/dL (ref 31.0–37.0)
MEAN CORPUSCULAR HEMOGLOBIN: 32.8 pg (ref 26.0–34.0)
MEAN PLATELET VOLUME: 7 fL (ref 7.0–10.0)
MONOCYTES ABSOLUTE COUNT: 0.3 10*9/L (ref 0.2–0.8)
MONOCYTES RELATIVE PERCENT: 7.3 %
NEUTROPHILS ABSOLUTE COUNT: 2.2 10*9/L (ref 2.0–7.5)
NEUTROPHILS RELATIVE PERCENT: 57.2 %
PLATELET COUNT: 267 10*9/L (ref 150–440)
RED BLOOD CELL COUNT: 4.28 10*12/L (ref 4.00–5.20)
RED CELL DISTRIBUTION WIDTH: 14.1 % (ref 12.0–15.0)
WBC ADJUSTED: 3.8 10*9/L — ABNORMAL LOW (ref 4.5–11.0)

## 2018-03-01 LAB — COMPREHENSIVE METABOLIC PANEL
ALBUMIN: 4.7 g/dL (ref 3.5–5.0)
ALKALINE PHOSPHATASE: 76 U/L (ref 38–126)
ALT (SGPT): 27 U/L (ref <35)
ANION GAP: 10 mmol/L (ref 7–15)
AST (SGOT): 27 U/L (ref 14–38)
BLOOD UREA NITROGEN: 8 mg/dL (ref 7–21)
BUN / CREAT RATIO: 12
CALCIUM: 10.2 mg/dL (ref 8.5–10.2)
CHLORIDE: 105 mmol/L (ref 98–107)
CO2: 29 mmol/L (ref 22.0–30.0)
CREATININE: 0.66 mg/dL (ref 0.60–1.00)
EGFR CKD-EPI AA FEMALE: >90 mL/min/{1.73_m2} (ref >=60)
EGFR CKD-EPI NON-AA FEMALE: >90 mL/min/{1.73_m2} (ref >=60)
GLUCOSE RANDOM: 84 mg/dL (ref 70–179)
POTASSIUM: 5.1 mmol/L — ABNORMAL HIGH (ref 3.5–5.0)
PROTEIN TOTAL: 8.1 g/dL (ref 6.5–8.3)
SODIUM: 144 mmol/L (ref 135–145)

## 2018-03-01 LAB — GLUCOSE RANDOM: Glucose:MCnc:Pt:Ser/Plas:Qn:: 84

## 2018-03-01 LAB — NEUTROPHILS RELATIVE PERCENT: Lab: 57.2

## 2018-03-01 NOTE — Unmapped (Addendum)
It was great seeing you today.    You will finish up your vaccines today.     Return to clinic in 2 months.     Lab Results   Component Value Date    WBC 3.8 (L) 03/01/2018    HGB 14.0 03/01/2018    HCT 42.0 03/01/2018    PLT 267 03/01/2018     Lab Results   Component Value Date    NA 144 03/01/2018    K 5.1 (H) 03/01/2018    CL 105 03/01/2018    CO2 29.0 03/01/2018    BUN 8 03/01/2018    CREATININE 0.66 03/01/2018    GLU 84 03/01/2018    CALCIUM 10.2 03/01/2018    MG 2.1 11/24/2017    PHOS 4.0 11/24/2017     Lab Results   Component Value Date    BILITOT 0.5 03/01/2018    BILIDIR <0.10 04/15/2016    PROT 8.1 03/01/2018    ALBUMIN 4.7 03/01/2018    ALT 27 03/01/2018    AST 27 03/01/2018    ALKPHOS 76 03/01/2018    GGT 115 (H) 08/10/2016     Lab Results   Component Value Date    INR 1.06 11/24/2017    APTT 24.3 (L) 04/15/2016       For prescription refills:   For refills, please check your medication bottles to see if you have additional refills left. If so, please call your pharmacy and follow the directions to request a refill. If you do not have any refills left, please make a request during your clinic visit or by submitting a request through MyChart or by calling [phone]. Please allow 24 hours if your request is made during the week or 48 hours if requests are made on the weekends or holidays.     --------------------------------------------------------------------------------------------------------------------  For appointments & questions Monday through Friday 8 AM-4:30 PM     Please call [phone] or Toll free [phone]    On Nights, Weekends and Holidays  Call [phone] and ask for the oncologist on call    Please visit PrivacyF[employer][email or web address], a resource created just for family members and caregivers.  This website lists support services, how and where to ask for help. It has tools to assist you as you help [place] care for your loved one.    [facility]  [address]  [place], [place] [address]  [email or web address]

## 2018-03-01 NOTE — Unmapped (Signed)
BMT Clinic Follow-up    Patient Name: Christine [name]  MRN: [id number]  Encounter date: 03/01/2018    Primary Care Provider: Allegra Grana, NP  BMT Attending MD:  Lanae Boast, MD.   ??  HPI: Christine [name] is a 63 y.o. 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 +736 post transplant.    Tacrolimus stop date 06/15/16.    ??  Interval History:  [name] presents today with her husband. She is feeling well today. She has fully recovered from her appendectomy and abscess. Denies fever, chills, n/v/d. She if off all medications. She continues to be able to perform all activities without any limitations. Will receive 2 year vaccines and PFTs today. Will get bone marrow biopsy in March per study protocol.     Oncology History    Referring/Local Oncologist: none    Diagnosis: de novo AML with mutated FLT3    Genetics:   Karyotype/FISH: 47XX +11     Molecular Genetics: FLT3 ITD present; IDH2: c.419G>A [p.Arg140Gln]  ??  No variants detected in ASXL1, BCOR, CEBPA, DNMT3A, ETV6/TEL, EZH2, FLT3, IDH1, KIT, NPM1, NRAS, RUNX1, SF3B1, SRSF2, STAG2, TET2, TP53, U2AF1, WT1, ZRSR2    Pertinent Phenotypic data: blasts expressed CD4, CD11b, CD11c, CD33, CD34, CD64, CD117, and HLA-DR and lack expression of other informative analyzed markers including B and T-cell antigens.     Disease-specific prognostic estimation: ELN Adverse (per FLT3 allelic ratio high per estimate by molecular pathologist)          Acute myeloid leukemia (CMS-HCC)    08/29/2015 Initial Diagnosis     Acute myeloid leukemia (RAF-HCC)      09/02/2015 -  Chemotherapy     Induction therapy with daunorubicin 60 mg/m2 IV daily x 3, cytarabine 200 mg/m2/d CIV x 7 days, midostaurin 50 mg PO BID days 8-21      09/22/2015 Biopsy     50% cellular marrow with 50% blasts      09/25/2015 -  Chemotherapy     CLAG, cladribine 5 mg/m2/day days 1-5, cytarabine 2 grams/m2 IV days 1-5, GCSF days 0-5      10/23/2015 Remission     CR      11/03/2015 -  Chemotherapy     Cytarabine 1.5 g/m2 IV Q12 hrs days 1, 3, 5      11/04/2015 Adverse Reaction     Cardiomyopathy      11/28/2015 Adverse Reaction     Stenotrophomonas maltophilia CVAD infection      12/19/2015 -  Chemotherapy     Cytarabine 1.5 g/m2 IV Q12 hrs days 1, 3, 5      02/24/2016 Transplant     CTN 1301 (control arm) conditioning regimen with Busulfan and Fludarabine followed by matched sibling allo, received bone marrow product with cell dose of 3.18 x 10^6.        Patient Active Problem List   Diagnosis   ??? Acute deep vein thrombosis (DVT) of left lower extremity (CMS-HCC)   ??? Osteoporosis (RAF-HCC)   ??? Acute myeloid leukemia (CMS-HCC)   ??? Heart failure (CMS-HCC)   ??? 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 LLE distal DVT  Chronic PE on 09/10/15 CTA   ??? Allogeneic stem cell transplant (RAF-HCC)   ??? Hypomagnesemia   ??? Immunocompromised state (CMS-HCC)   ??? Pancytopenia (CMS-HCC)   ??? Immunization due   ??? Pneumonia of both lower  lobes due to infectious organism   ??? 11/08/2017: Lap appendectomy fo Acute perforated appendicitis     Review of Systems:  A full system review was performed and was negative except as noted in the above interval history.    **Reviewed and updated past medical, surgical, social, and family history as appropriate.      Allergies   Allergen Reactions   ??? Dapsone      Relative G6PD deficiency (lvl=5.1, lower limit of normal is 5.3)   ??? Penicillins Rash and Hives   ??? Rasburicase      Relative G6PD deficiency (lvl=5.1, lower limit of normal is 5.3)   ??? Sulfasalazine Hives   ??? Vancomycin Other (See Comments)     Avoid if possible, but if needed: Red man syndrome- slow rate over 2 hours, benadryl and ranitidine premed   ??? Adhesive Rash     Uses mepore     ??? Amoxicillin Rash   ??? Cefepime Rash     Can trial again while taking with antihistamine medications/antihistamine premeds    06/10/17: Has successfully tolerated cefepime with benadryl 25 PO as premed    ??? Morphine Rash     Can trial again while taking with antihistamine medications/antihistamine premeds (fentanyl may be better alternative if possible)   ??? Tapentadol Rash     Uses mepore     No current outpatient medications on file.     Current Facility-Administered Medications   Medication Dose Route Frequency Provider Last Rate Last Dose   ??? DTaP-hepatitis B recombinant-IPV (PEDIARIX) 10 mcg-25Lf-25 mcg-10Lf/0.5 mL injection            ??? haemophilus B polysac-tetanus toxoid (ActHIB) 10 mcg/0.5 mL injection            ??? measles, mumps and rubella vaccine (MMR) 1,000-12,500 TCID50/0.5 mL injection            ??? pneumococcal polysacchride (23-valps) (PNEUMOVAX) 25 mcg/0.5 mL vaccine              Facility-Administered Medications Ordered in Other Visits   Medication Dose Route Frequency Provider Last Rate Last Dose   ??? flu vacc qs2019-20 6mos up(PF) (FLUARIX, FLULAVAL, FLUZONE) 60 mcg (15 mcg x 4)/0.5 mL injection            ??? varicella-zoster gE-AS01B (PF) (SHINGRIX) 50 mcg/0.5 mL injection              Chronic GVHD Assessment  March 01, 2018  Skin: BSA based features: None (0);  non-BSA based features No sclerotic features (0); Other features:  NONE  Mouth: No symptoms (0) Lichen planus-like features present (Diagnostic) no  Eyes: No symptoms (0) Keratoconjunctivitis sicca confirmed by opthalmologist no  GI Tract: features None No symptoms (0)  Liver: Normal total bili and ALT/AP <3x ULN (0)  Lungs: symptom score No symptoms (0), PFTs later today  Joints and Fascia: symptom score No symptoms (0) P-ROM shoulder 7 (normal), Elbow 7 (normal), Wrist/Fingers 7 (normal), Ankles 4 (normal)      Genital tract: Not examined  Other features due to cGVHD: None    Overall cGVHD Severity (current):  None   Max Severity of cGVHD post-transplant: None     Physical Exam  General : Mid aged female, well appearing, in no acute distress   Head: Normocephalic, atraumatic     ENT: Anicteric, Moist mucous membranes. No lesions or erythema  Cardiovascular: Pulse normal rate, regularity and rhythm. S1 and S2 normal  Lungs: Clear to auscultation  bilaterally, without wheezes/crackles/rhonchi. Good air movement.   Skin: Warm, dry, intact. No rash noted.   Psychiatry: Alert and oriented to person, place, and time.   Abdomen : Normoactive bowel sounds, abdomen soft, non-tender   Extremeties: No edema.   Musculo Skeletal: Full range of motion in shoulder, elbow, hip knee, ankle, left hand and feet.  Neurologic: CNII-XII grossly intact. Normal strength and sensation throughout    Test Results:   Lab Results   Component Value Date    WBC 3.8 (L) 03/01/2018    HGB 14.0 03/01/2018    HCT 42.0 03/01/2018    PLT 267 03/01/2018       Lab Results   Component Value Date    NA 144 03/01/2018    K 5.1 (H) 03/01/2018    CL 105 03/01/2018    CO2 29.0 03/01/2018    BUN 8 03/01/2018    CREATININE 0.66 03/01/2018    GLU 84 03/01/2018    CALCIUM 10.2 03/01/2018    MG 2.1 11/24/2017    PHOS 4.0 11/24/2017       Lab Results   Component Value Date    BILITOT 0.5 03/01/2018    BILIDIR <0.10 04/15/2016    PROT 8.1 03/01/2018    ALBUMIN 4.7 03/01/2018    ALT 27 03/01/2018    AST 27 03/01/2018    ALKPHOS 76 03/01/2018    GGT 115 (H) 08/10/2016       Lab Results   Component Value Date    INR 1.06 11/24/2017    APTT 24.3 (L) 04/15/2016     Assessment/Plan:    **AML:??(FLT3-ITD, +11, and IDH2)??in CR prior to transplant.   06/22/16: BMBx:   - ??Normocellular bone marrow (40%) with trilineage hematopoiesis and 3% blasts.  - ??Routine cytogenetics: donor karyotype; FISH (trisomy 11) results are normal.  - ??Flow cytometric MRD results reveal no evidence of aberrant myeloid antigen expression or abnormal myeloblasts.  - ??DNA Chimerism see below.  - Considered??maintenance with FLT3 inhibitor??(sorafenib). Was reluctant to start around D+45-60 due to pancytopenia and changing chimerism. Decided against it. 10 months after transplant maintenance was discussed again - decided against it.   ??  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 ??  Conditioning:??  ????????????????????????Busulfan PK calculated from test dose 80 mg/hr daily and Fludarabine 40 mg/m2 daily ??day -5 thru day -2  ????????????????????????Day 0 -??02/24/16  Cell dose:??3.24 x10^8 TNC (Marrow)  Chimerisms/Marrow:  03/31/16: Day 30 marrow normocellular w/1% blasts. FISH and cytos are normal. MRD negative.   BM Chimerisms show 75% donor CD3 and >95% donor in unfractionated compartment.   04/22/16: PB Chimerisms show 71% donor CD3 and >95% donor unfractionated. 05/05/16: BM Chimerisms show 65% donor CD3 and >95% donor unfractionated.   05/18/16: PB Chimerisms show 74% donor CD3 and 95% donor unfractionated.   06/01/16: PB Chimerisms show 69% donor CD3 and >95% donor unfractionated.  06/22/16: PB Chimerisms show 80% donor CD3 and >95% donor unfractionated  06/22/16: BM Chimerisms show 79% donor CD3 and >95% donor unfractionated.   07/06/16: PB Chimerisms show 77% donor CD3 and >95% donor unfractionated.  07/13/16: Off tac x 4 weeks.??Chimerism check next visit.  If no full chimerism at 8-10 weeks after discontinuation of tac will consider DLI. We do not have any donor cells stored. If we need to recollect for DLI, will consider mobilizing the donor and collecting CD3+ cells for DLI as well as CD34+ cells for  another transplant.   07/26/16: Off tac almost 6 weeks. PB Chimerisms show 74% donor in CD3 and >95% donor unfractionated.   08/10/16: Off tac 8 weeks. Repeat DNA shows slight increase in CD3 to 79%. Plan after discussion with Dr. Oswald Hillock to re-collect sister for more donor cells for DLI. However, today with??elevated LFTs, this is new, will need to follow these and make sure no GVHD prior to contacting sister for further donation. This was discussed with Pam today.   08/18/16: Off tac 9 weeks.??PB Chimerisms show??85% donor CD3 and >95% donor unfractionated.  08/24/16: Off tac 10 weeks. BM Chimerisms show 79% donor CD3 and >95% donor unfractionated.  09/07/16: Off tac 12 weeks. PB Chimerisms show 91% donor CD3 and >95% donor unfractionated.??  09/28/16: PB chimerism 94% donor CD3 and >95% donor unfractionated.  10/26/16: PB chimerism 90% donor CD3 and >95% donor unfractionated.??  11/23/16: PB chimerism 95% donor CD3 and >95% donor unfractionated.??  12/22/16: PB chimerism 94% donor CD3 and >95% donor unfractionated.??  01/18/17: PB chimerism 93% donor CD3 and >95% donor unfractionated.??  02/15/17: PB chimerism 95% donor CD3 and >95% donor unfractionated.??  03/15/17: PB chimerism 95% donor CD3 and >95% donor unfractionated.??  04/12/17: PB chimerism 95% donor CD3 and >95% donor unfractionated.??  05/17/17: PB chimerism 95% donor CD3 and >95% donor unfractionated.??  06/21/17: PB chimerism >95% donor CD3 and >95% donor unfractionated.??  08/17/17:  PB chimerism >95% donor CD3 and >95% donor unfractionated.??  09/21/17: PB chimerism >95% donor CD3 and >95% donor unfractionated.??  11/30/17: PB chimerism 95% donor CD3 and >95% donor unfractionated.??  12/20/17: PB chimerism 95% donor CD3 and >95% donor unfractionated.??  03/01/18: PB chimerism pending from today    GVHD Prophy:??  - Methotrexate on day +1, 3, 6, 11 (received all 4 doses)  - Tac tapered and eventually DC'd 06/15/16????  - ??Has had no e/o GVHD,developed new elevated LFTs noted at visit on 7/31. Liver US(08/18/16): 1.Patent hepatic vasculature with normal flow direction 2.Large simple hepatic cysts.3. Stone in gallbladder.   - 01/18/17: LFTs continue to improve.   - 12/20/17: LFT WNL.   - 03/01/18: LFT WNL  ??  Heme:??CBC stable.   Transfusion criteria: * Pre-med with tylenol and Zyrtec pre- blood or platelet transfusion.   - Hgb <8 and Plt <10K or bleeding.   - Granix if ANC <0.5.   ??  Hx of DVT:  - Completed 6 mos or anticoagulation.     **ID:??Afeb.   Sore Throat  - Will check respiratory panel today-negative    Acute appendicitis and serositis, complicated by abscess. Has a drain in place. Completed a few days of antibiotics prescribed by surgery.   -Fully recovered    Prophylaxis:   - Continue Valtrex for Zoster prophy 500 mg qd.   - Bactrim stopped due to pancytopenia. Allergic to dapsone. Switched to Pentamidine (Dose #1 - 5/8). Continues on monthly Pentamidine given low CD4 count.   - 07/12/17: Will DC pentamidine.   - Vaccines as below.  ??  Low CD4  - 06/30/16: Off IS. CD4 count 126.   - 09/07/16: CD4 count 137. ??  - 09/28/16: Received Pentamidine today. Plan to check CD4 level at next visit and continue with monthly pentamidine.   - 10/26/16: CD4 count 180. Continue monthly pentamidine.   - 11/23/16: CD4 count 167. Continue monthly pentamidine.   - 12/22/16: Continue monthly pentamidine (today).  - 01/19/16: Pentamidine due today.    - 02/15/17: Repeat CD4 count 188.  Continue pentamidine.   - 03/15/17: Received dose today.   - 04/12/17: Received dose today.   - 05/17/17: CD4 count 216. Received pentamidine today. Will discuss restarting Bactrim DS on Sat and Sun.  - 06/21/17: Pentamidine today. After recovery from cold will discuss restarting Bactrim DS on Sat and Sun.  - 07/12/17: Will DC pentamidine.   -03/01/18: CD4 count pending from today.   ??  Viral studies:  - 11/23/16: Viral panels have been negative. Will stop monitoring unless clinically indicated.    - 03/15/17: [facility] Respiratory virus panel positive for rhinovirus.     - 06/08/17: Parainfluenza pneumonia. IgG on 5/30 was 630.   - 07/12/17: Fully recovered.   ??  Vaccine:   - 09/07/16: First set of post-transplant vaccines. Next set due in the end of February - next visit   Will also consider Shingrix - likely after her immunity is a bit better.    - 03/15/17: Delay vaccines due to cold symptoms. Will plan on the next dose of post-transplant vaccines and Shingrix next visit.   - 04/12/17: Today 12 months post transplant vaccines. First dose of Shingrix vaccine. Next set in 10/2017.   - 12/20/17: Shingrix given today as well as flu shot.   - 02/01/18: Valtrex completed last week. S/P Shingrix X 2. No further Valtrex needed.   -03/01/18: Completed vaccines today.    **CV:   Hx of anthracycline induced cardiomyopathy:  - Nov 2017 w/EF 35%. This has now resolved with 01/2016 Echo showing estimated EF of 50%.   - Treated with lisinopril and metoprolol (Metoprolol 50mg  XL).   - Now off all antihypertensives due to intolerance (hypotension).   - 03/15/17: BP WNL. Asymptomatic.   - 04/12/17: Patient reported that her PCP checked her cholesterol and it was borderline high 239. She is wondering if she should take a lipid lowering agents recommended by her PCP. I advised her to discuss this with Dr. Barbette Merino whom she is scheduled to see in a few months.   ??  HTN:??Lisinopril stopped d/t AKI.   - 04/07/16: D/C'd Norvasc. Due to asymptomatic hypotension. Pre-transplant blood pressures run in the 100/70-80 range per her report.   Per Dr. Barbette Merino, ok to stop metoprolol given low BPs with plan to repeat Echo in 6-8 weeks.  - 07/06/16: Baseline ECHO today showed EF of 45-50%. DC metoprolol.   - 07/13/16: Follow BP. Will repeat ECHO 8-12 weeks after stopping metoprolol. Repeat ECHO scheduled for 10/26/2016.  - 10/26/16: Repeat ECHO showed normal EF. Ms. Noori saw Ramond Marrow who decided not to restart ACE inhibitor or beta blocker. Recommended annual follow up with ECHO of the heart.    -03/01/18: Has follow up with Dr. Barbette Merino.   ????  **GI/Hepatic:??  - Completed VOD prophylaxis.   - Elevated LFTs:??Mild elevation at visit on 07/26/16. 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   ---??08/10/16 AST and ALT now 107/170 respectively. Normal amylase and lipase. No abdominal pain or GI symptoms.   ---??08/13/16 ????AST 191 and ALT 283. Alk phos 177.  ---??08/18/16 ??AST 144 and ALT 264. Alk phos 218. Liver US: with patent hepatic vasculature notable for large simple hepatic cyst and gallstones.  ---??09/07/16: AST 136 and ALT 242. Alk phos 197. Stable.   ---??09/28/16: AST 91 and ALT 159. Alk phos 128. LFTs continue to improve.   --- 10/26/16: AST 84 and ALT 139. Alk phos 114. LFTs continue to improve.   --- 11/23/16:  AST 64 and ALT 102. Alk phos 96. LFTs continue  to improve.   --- 12/22/16:  AST 51 and ALT 72. Alk phos 79. LFTs continue to improve.   --- 01/18/17: AST 42 and ALT 53. Alk phos 75. LFTs continue to improve.    --- 02/15/17: AST 47 and ALT 53. Alk phos 71. Stable.   --- 06/21/17: LFT WNL.   --- 09/21/17: LFT WNL.   --- 12/20/17:  LFTs WNL.  --- 03/01/2018: LFTs WNL  ??  **Bone Health:  - 03/31/16: DEXA scan showed osteoporosis of the spine and osteopenia of the femoral neck. Zometa and Ca/Vit D.   - 04/02/16: Received 1st dose of Zometa.   - 07/06/16. 2nd dose of Zometa. Completed. Continue Ca/Vit D supplement.   - 03/15/17: Patient received two doses of Zometa. Continue Ca/Vit D supplement. Vit D 38.9 WNL.   ??  Plan:   - Will complete vaccine series today  - Bone marrow biopsy in early March per study protocol  - PFTs today per study protocol  - Follow up in 8 weeks with labs and Dr. Oswald Hillock  - DEXA due in Spring, it will be 2 years since last DEXA scan     Myra Rude, ANP  [facility]    I personally saw and examined this patient with an APP. I participated in the key portions of the service. I have reviewed the APP's note and agree with the documented findings and plan. The note reflects my participation and input in the interval history, physical exam, and complex medical decision making. It was medically necessary for me to see the patient due to complex medical issues in the setting of allogeneic stem cell transplantation.     Pam Ottaway is a 63 y.o. female with a diagnosis of AML ??(FLT3-ITD and IDH2+)s/p MRD allogeneic SCT. Clinically she is doing very well. Her counts and chimerism studies are stable. Plan BMBx at 2 years post transplant.    Lanae Boast, M.D.  Associate Professor of Medicine  Division of Hematology-Oncology [facility]

## 2018-03-02 LAB — LYMPH MARKER LIMITED,FLOW
ABSOLUTE CD3 CNT: 576 {cells}/uL — ABNORMAL LOW (ref 915–3400)
ABSOLUTE CD8 CNT: 230 {cells}/uL (ref 180–1520)
CD3% (T CELLS)": 50 % — ABNORMAL LOW (ref 61–86)
CD4% (T HELPER)": 29 % — ABNORMAL LOW (ref 34–58)

## 2018-03-02 LAB — CD3% (T CELLS)": Lab: 50 — ABNORMAL LOW

## 2018-03-14 DIAGNOSIS — C9201 Acute myeloblastic leukemia, in remission: Principal | ICD-10-CM

## 2018-03-15 ENCOUNTER — Institutional Professional Consult (permissible substitution): Admit: 2018-03-15 | Discharge: 2018-03-16 | Payer: PRIVATE HEALTH INSURANCE

## 2018-03-15 ENCOUNTER — Ambulatory Visit
Admit: 2018-03-15 | Discharge: 2018-03-16 | Payer: PRIVATE HEALTH INSURANCE | Attending: Primary Care | Primary: Primary Care

## 2018-03-15 ENCOUNTER — Other Ambulatory Visit: Admit: 2018-03-15 | Discharge: 2018-03-16 | Payer: PRIVATE HEALTH INSURANCE

## 2018-03-15 DIAGNOSIS — Z9484 Stem cells transplant status: Principal | ICD-10-CM

## 2018-03-15 DIAGNOSIS — C9201 Acute myeloblastic leukemia, in remission: Principal | ICD-10-CM

## 2018-03-15 LAB — COMPREHENSIVE METABOLIC PANEL
ALBUMIN: 4.4 g/dL (ref 3.5–5.0)
ALKALINE PHOSPHATASE: 79 U/L (ref 38–126)
ANION GAP: 11 mmol/L (ref 7–15)
AST (SGOT): 36 U/L (ref 14–38)
BILIRUBIN TOTAL: 0.5 mg/dL (ref 0.0–1.2)
BLOOD UREA NITROGEN: 8 mg/dL (ref 7–21)
BUN / CREAT RATIO: 11
CALCIUM: 9.9 mg/dL (ref 8.5–10.2)
CALCIUM: 9.9 mg/dL — ABNORMAL HIGH (ref 8.5–10.2)
CHLORIDE: 103 mmol/L (ref 98–107)
CREATININE: 0.7 mg/dL (ref 0.60–1.00)
EGFR CKD-EPI AA FEMALE: >90 mL/min/{1.73_m2} (ref >=60)
GLUCOSE RANDOM: 96 mg/dL (ref 70–179)
POTASSIUM: 4.6 mmol/L (ref 3.5–5.0)
PROTEIN TOTAL: 8.1 g/dL (ref 6.5–8.3)
SODIUM: 142 mmol/L (ref 135–145)

## 2018-03-15 LAB — CBC W/ AUTO DIFF
BASOPHILS ABSOLUTE COUNT: 0 10*9/L (ref 0.0–0.1)
BASOPHILS RELATIVE PERCENT: 0.3 %
EOSINOPHILS ABSOLUTE COUNT: 0.3 10*9/L (ref 0.0–0.4)
EOSINOPHILS RELATIVE PERCENT: 4.8 %
HEMATOCRIT: 40.8 % (ref 36.0–46.0)
HEMOGLOBIN: 13.6 g/dL (ref 12.0–16.0)
LARGE UNSTAINED CELLS: 3 % (ref 0–4)
LYMPHOCYTES ABSOLUTE COUNT: 0.8 10*9/L — ABNORMAL LOW (ref 1.5–5.0)
LYMPHOCYTES ABSOLUTE COUNT: 0.8 10*9/L — ABNORMAL LOW (ref 1.5–5.0)
LYMPHOCYTES RELATIVE PERCENT: 12.8 %
MEAN CORPUSCULAR HEMOGLOBIN CONC: 33.4 g/dL (ref 31.0–37.0)
MEAN CORPUSCULAR HEMOGLOBIN: 32 pg (ref 26.0–34.0)
MEAN CORPUSCULAR VOLUME: 95.8 fL (ref 80.0–100.0)
MEAN PLATELET VOLUME: 8 fL (ref 7.0–10.0)
MONOCYTES ABSOLUTE COUNT: 0.4 10*9/L (ref 0.2–0.8)
NEUTROPHILS ABSOLUTE COUNT: 4.8 10*9/L (ref 2.0–7.5)
PLATELET COUNT: 261 10*9/L (ref 150–440)
RED BLOOD CELL COUNT: 4.25 10*12/L (ref 4.00–5.20)
WBC ADJUSTED: 6.6 10*9/L (ref 4.5–11.0)

## 2018-03-15 NOTE — Unmapped (Signed)
Encounter addended by: Dorrene German on: 03/15/2018 12:01 PM   Actions taken: Test resulted

## 2018-03-15 NOTE — Unmapped (Signed)
Bone Marrow Biopsy and Aspiration    Service Date:March 15, 2018 11:06 AM     Performing Clinician: Marcelline Deist, ANP-BC   Performing Service:MEDICINE::HEMATOLOGY/ONCOLOGY      Patient   Name: Christine [name]   MRNO: [id number]     Indications: AML   Indications : 2 year post transplant sample     Procedure   Time Out: Performed immediately prior to the procedure       Name: Bone Marrow Aspiration and Biopsy      Description:   The procedure risks and alternatives of the procedure were explained to the patient.   Naylee Frankowski Milam was made aware that she is not to drive for the next 24 hours if receiving any IV medications associated with this procedure. She verbalized understanding and signed informed consent. After a time-out in which his patient identifiers were checked by 2 providers, the patient was laid in the prone position on the table. The left posterior superior iliac spine and iliac crest were cleaned, prepped and draped in the usual sterile fashion.     Site: left posterior superior iliac spine.     Aseptic preparation, draping, and technique was used.     Anesthesia: 2% Lidocaine.    Periprocedural medications: Ativan 1 mg IV x1 for situational anxiety    Core Biopsy was obtained.     Aspirate was obtained.     Pressure applied and hemostasis achieved. Sterile bandage applied.    Specimen was sent for routine histopathologic stains and sectioning, flow cytometry, cytogenetics and molecular analysis.       Complications   None.     Specimen(s)   Bone marrow aspirate and core biopsy.     Additional post procedure instructions:  The patient was given verbal instructions for wound care, such as to keep the biopsy site dry, covered for 24 hours, and to call the provider with including but not limited to: bleeding from the site, redness and/or puss drainage from the site and fever.      The results will be reviewed with the patient when complete.

## 2018-03-15 NOTE — Unmapped (Signed)
Pt arrived ambulatory in NAD to room 8 from lobby. 1120 Pt tolerated procedure well  AVS printed and given to patient. Pt left infusion area ambulatory in NAD

## 2018-03-15 NOTE — Unmapped (Addendum)
-  Please do not drive for 24 hours after the procedure if you received medications through your IV.  -Keep the biopsy site dry and covered for 24 hours.   -After 24 hours, you may remove the dressing and cover the site with a bandage.  -Call the clinic if you have: bleeding from the site that does not stop after 10 minutes of firm pressure, redness and/or puss drainage from the site, and fever.      BMT clinic: [phone]  Inpatient unit: [phone] (call this number if after hours)      Lab Results   Component Value Date    WBC 6.6 03/15/2018    HGB 13.6 03/15/2018    HCT 40.8 03/15/2018    PLT 261 03/15/2018     Lab Results   Component Value Date    NA 142 03/15/2018    K 4.6 03/15/2018    CL 103 03/15/2018    CO2 28.0 03/15/2018    BUN 8 03/15/2018    CREATININE 0.70 03/15/2018    GLU 96 03/15/2018    CALCIUM 9.9 03/15/2018    MG 2.1 11/24/2017    PHOS 4.0 11/24/2017     Lab Results   Component Value Date    BILITOT 0.5 03/15/2018    BILIDIR <0.10 04/15/2016    PROT 8.1 03/15/2018    ALBUMIN 4.4 03/15/2018    ALT 35 (H) 03/15/2018    AST 36 03/15/2018    ALKPHOS 79 03/15/2018    GGT 115 (H) 08/10/2016     Lab Results   Component Value Date    INR 1.06 11/24/2017    APTT 24.3 (L) 04/15/2016       For prescription refills:   For refills, please check your medication bottles to see if you have additional refills left. If so, please call your pharmacy and follow the directions to request a refill. If you do not have any refills left, please make a request during your clinic visit or by submitting a request through MyChart or by calling [phone]. Please allow 24 hours if your request is made during the week or 48 hours if requests are made on the weekends or holidays.     --------------------------------------------------------------------------------------------------------------------  For appointments & questions Monday through Friday 8 AM-4:30 PM     Please call [phone] or Toll free [phone]    On Nights, Weekends and Holidays  Call [phone] and ask for the oncologist on call    Please visit Privacy[employer]cz, a resource created just for family members and caregivers.  This website lists support services, how and where to ask for help. It has tools to assist you as you help [place] care for your loved one.    [facility]  [address]  [place], [place] [address]  [email or web address]

## 2018-05-09 ENCOUNTER — Encounter: Payer: Self-pay | Admitting: *Deleted

## 2018-06-08 ENCOUNTER — Other Ambulatory Visit: Payer: Self-pay | Admitting: *Deleted

## 2018-06-08 NOTE — Patient Outreach (Signed)
[facility]) Care Management  06/08/2018  [facility] [birth year] [phone]   HTA HRA Telephone Screen     Outreach Attempt:  Successful telephone outreach to patient in the month of April to complete Health Risk Assessment Screening.  HIPAA verified with patient.  Patient reporting a history of leukemia with chemotherapy reducing her ejection fracture, but states now her EF is back to normal.  Denies any issues with fluid retention or heart failure.  Has Advance Directive in place. Denies any further needs, at this time.   Plan:  Patient will be placed in a [place] with 6 month follow up.  RN Health Coach will send [facility] Welcome Letter.  RN Health Coach will make next telephone outreach to patient for follow up in the month of October.  [place] [phone] [email or web address]

## 2018-06-19 DIAGNOSIS — D225 Melanocytic nevi of trunk: Secondary | ICD-10-CM | POA: Diagnosis not present

## 2018-06-19 DIAGNOSIS — L578 Other skin changes due to chronic exposure to nonionizing radiation: Secondary | ICD-10-CM | POA: Diagnosis not present

## 2018-06-19 DIAGNOSIS — C44712 Basal cell carcinoma of skin of right lower limb, including hip: Secondary | ICD-10-CM | POA: Diagnosis not present

## 2018-06-19 DIAGNOSIS — D2230 Melanocytic nevi of unspecified part of face: Secondary | ICD-10-CM | POA: Diagnosis not present

## 2018-06-19 DIAGNOSIS — Z1283 Encounter for screening for malignant neoplasm of skin: Secondary | ICD-10-CM | POA: Diagnosis not present

## 2018-06-19 DIAGNOSIS — D485 Neoplasm of uncertain behavior of skin: Secondary | ICD-10-CM | POA: Diagnosis not present

## 2018-06-19 DIAGNOSIS — C44719 Basal cell carcinoma of skin of left lower limb, including hip: Secondary | ICD-10-CM | POA: Diagnosis not present

## 2018-06-19 DIAGNOSIS — D229 Melanocytic nevi, unspecified: Secondary | ICD-10-CM | POA: Diagnosis not present

## 2018-06-19 DIAGNOSIS — L821 Other seborrheic keratosis: Secondary | ICD-10-CM | POA: Diagnosis not present

## 2018-06-19 DIAGNOSIS — D1800 Hemangioma unspecified site: Secondary | ICD-10-CM | POA: Diagnosis not present

## 2018-06-19 DIAGNOSIS — L812 Freckles: Secondary | ICD-10-CM | POA: Diagnosis not present

## 2018-07-04 ENCOUNTER — Encounter: Payer: Self-pay | Admitting: Family

## 2018-07-05 ENCOUNTER — Other Ambulatory Visit: Admit: 2018-07-05 | Discharge: 2018-07-06 | Payer: PRIVATE HEALTH INSURANCE

## 2018-07-05 ENCOUNTER — Ambulatory Visit: Admit: 2018-07-05 | Discharge: 2018-07-06 | Payer: PRIVATE HEALTH INSURANCE

## 2018-07-05 DIAGNOSIS — T451X5A Adverse effect of antineoplastic and immunosuppressive drugs, initial encounter: Secondary | ICD-10-CM

## 2018-07-05 DIAGNOSIS — M810 Age-related osteoporosis without current pathological fracture: Secondary | ICD-10-CM

## 2018-07-05 DIAGNOSIS — Z9481 Bone marrow transplant status: Principal | ICD-10-CM

## 2018-07-05 DIAGNOSIS — D899 Disorder involving the immune mechanism, unspecified: Secondary | ICD-10-CM

## 2018-07-05 DIAGNOSIS — C9201 Acute myeloblastic leukemia, in remission: Secondary | ICD-10-CM

## 2018-07-05 DIAGNOSIS — I427 Cardiomyopathy due to drug and external agent: Secondary | ICD-10-CM

## 2018-07-05 DIAGNOSIS — Z6825 Body mass index (BMI) 25.0-25.9, adult: Secondary | ICD-10-CM | POA: Diagnosis not present

## 2018-07-05 LAB — CBC W/ AUTO DIFF
BASOPHILS ABSOLUTE COUNT: 0 10*9/L (ref 0.0–0.1)
BASOPHILS RELATIVE PERCENT: 0.6 %
EOSINOPHILS ABSOLUTE COUNT: 0.1 10*9/L (ref 0.0–0.4)
EOSINOPHILS RELATIVE PERCENT: 3.2 %
HEMATOCRIT: 40.9 % (ref 36.0–46.0)
HEMOGLOBIN: 13.3 g/dL (ref 12.0–16.0)
LARGE UNSTAINED CELLS: 3 % (ref 0–4)
LYMPHOCYTES RELATIVE PERCENT: 27.2 %
MEAN CORPUSCULAR HEMOGLOBIN CONC: 32.5 g/dL (ref 31.0–37.0)
MEAN CORPUSCULAR HEMOGLOBIN: 32 pg (ref 26.0–34.0)
MEAN CORPUSCULAR VOLUME: 98.5 fL (ref 80.0–100.0)
MEAN PLATELET VOLUME: 7.8 fL (ref 7.0–10.0)
MONOCYTES ABSOLUTE COUNT: 0.2 10*9/L (ref 0.2–0.8)
MONOCYTES RELATIVE PERCENT: 6.4 %
NEUTROPHILS ABSOLUTE COUNT: 1.9 10*9/L — ABNORMAL LOW (ref 2.0–7.5)
NEUTROPHILS RELATIVE PERCENT: 59.3 %
PLATELET COUNT: 250 10*9/L (ref 150–440)
WBC ADJUSTED: 3.2 10*9/L — ABNORMAL LOW (ref 4.5–11.0)

## 2018-07-05 LAB — COMPREHENSIVE METABOLIC PANEL
ALBUMIN: 4.4 g/dL (ref 3.5–5.0)
ALKALINE PHOSPHATASE: 87 U/L (ref 38–126)
ANION GAP: 8 mmol/L (ref 7–15)
AST (SGOT): 27 U/L (ref 14–38)
BILIRUBIN TOTAL: 0.6 mg/dL (ref 0.0–1.2)
BLOOD UREA NITROGEN: 12 mg/dL (ref 7–21)
BUN / CREAT RATIO: 16
CALCIUM: 9.5 mg/dL (ref 8.5–10.2)
CHLORIDE: 104 mmol/L (ref 98–107)
CO2: 27 mmol/L (ref 22.0–30.0)
CREATININE: 0.73 mg/dL (ref 0.60–1.00)
EGFR CKD-EPI AA FEMALE: >90 mL/min/{1.73_m2} (ref >=60)
EGFR CKD-EPI NON-AA FEMALE: 87 mL/min/{1.73_m2} (ref >=60)
GLUCOSE RANDOM: 107 mg/dL (ref 70–179)
POTASSIUM: 4.6 mmol/L (ref 3.5–5.0)
PROTEIN TOTAL: 7.5 g/dL (ref 6.5–8.3)
SODIUM: 139 mmol/L (ref 135–145)

## 2018-07-05 LAB — GLUCOSE RANDOM: Glucose:MCnc:Pt:Ser/Plas:Qn:: 107

## 2018-07-05 LAB — NEUTROPHILS ABSOLUTE COUNT: Lab: 1.9 — ABNORMAL LOW
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
## 2018-07-07 ENCOUNTER — Other Ambulatory Visit: Payer: Self-pay | Admitting: *Deleted

## 2018-07-07 NOTE — Patient Outreach (Signed)
[facility]) Care Management  07/07/2018  Robyn Montgomery 1955/12/27 [id number]   Case Closure/Transition to [facility]   Outreach:  Patient case has been transitioned to [facility] for further Care Management assistance.  Plan: RN Health Coach will close case at this time. RN Health Coach will send primary care provider Care Management Case Closure Letter. RN Health Coach will make patient inactive with [facility] Care Management at this time.  [place] [phone] [email or web address]

## 2018-07-12 ENCOUNTER — Ambulatory Visit: Admit: 2018-07-12 | Discharge: 2018-07-13 | Payer: PRIVATE HEALTH INSURANCE

## 2018-07-12 DIAGNOSIS — Z9481 Bone marrow transplant status: Secondary | ICD-10-CM

## 2018-07-12 DIAGNOSIS — C9201 Acute myeloblastic leukemia, in remission: Principal | ICD-10-CM

## 2018-07-12 DIAGNOSIS — Z9484 Stem cells transplant status: Secondary | ICD-10-CM

## 2018-07-12 LAB — CBC W/ AUTO DIFF
BASOPHILS ABSOLUTE COUNT: 0 10*9/L (ref 0.0–0.1)
BASOPHILS RELATIVE PERCENT: 0.2 %
EOSINOPHILS ABSOLUTE COUNT: 0.1 10*9/L (ref 0.0–0.4)
EOSINOPHILS RELATIVE PERCENT: 2.7 %
HEMATOCRIT: 37.4 % (ref 36.0–46.0)
HEMOGLOBIN: 12.7 g/dL (ref 12.0–16.0)
LYMPHOCYTES ABSOLUTE COUNT: 1.4 10*9/L — ABNORMAL LOW (ref 1.5–5.0)
LYMPHOCYTES RELATIVE PERCENT: 32.1 %
MEAN CORPUSCULAR HEMOGLOBIN CONC: 34 g/dL (ref 31.0–37.0)
MEAN CORPUSCULAR HEMOGLOBIN: 33 pg (ref 26.0–34.0)
MEAN CORPUSCULAR VOLUME: 97.1 fL (ref 80.0–100.0)
MONOCYTES ABSOLUTE COUNT: 0.5 10*9/L (ref 0.2–0.8)
MONOCYTES RELATIVE PERCENT: 10.3 %
NEUTROPHILS ABSOLUTE COUNT: 2.4 10*9/L (ref 2.0–7.5)
NEUTROPHILS RELATIVE PERCENT: 54.7 %
PLATELET COUNT: 230 10*9/L (ref 150–440)
RED BLOOD CELL COUNT: 3.85 10*12/L — ABNORMAL LOW (ref 4.00–5.20)
WBC ADJUSTED: 4.4 10*9/L — ABNORMAL LOW (ref 4.5–11.0)

## 2018-07-12 LAB — COMPREHENSIVE METABOLIC PANEL
ALBUMIN: 4.5 g/dL (ref 3.5–5.0)
ALT (SGPT): 19 U/L (ref <35)
ANION GAP: 8 mmol/L (ref 7–15)
AST (SGOT): 25 U/L (ref 14–38)
BILIRUBIN TOTAL: 0.4 mg/dL (ref 0.0–1.2)
BLOOD UREA NITROGEN: 14 mg/dL (ref 7–21)
BUN / CREAT RATIO: 19
CALCIUM: 9.9 mg/dL (ref 8.5–10.2)
CHLORIDE: 109 mmol/L — ABNORMAL HIGH (ref 98–107)
CO2: 26 mmol/L (ref 22.0–30.0)
CREATININE: 0.75 mg/dL (ref 0.60–1.00)
EGFR CKD-EPI AA FEMALE: >90 mL/min/{1.73_m2} (ref >=60)
EGFR CKD-EPI NON-AA FEMALE: 84 mL/min/{1.73_m2} (ref >=60)
GLUCOSE RANDOM: 94 mg/dL (ref 70–179)
POTASSIUM: 4.5 mmol/L (ref 3.5–5.0)
PROTEIN TOTAL: 7.2 g/dL (ref 6.5–8.3)
SODIUM: 143 mmol/L (ref 135–145)

## 2018-07-12 LAB — LYMPHOCYTES RELATIVE PERCENT: Lab: 32.1

## 2018-07-12 LAB — PROTEIN TOTAL: Protein:MCnc:Pt:Ser/Plas:Qn:: 7.2

## 2018-07-26 ENCOUNTER — Other Ambulatory Visit: Payer: Self-pay

## 2018-07-26 ENCOUNTER — Ambulatory Visit (INDEPENDENT_AMBULATORY_CARE_PROVIDER_SITE_OTHER): Payer: PPO | Admitting: Family

## 2018-07-26 VITALS — BP 120/78 | HR 87 | Temp 98.2°F | Resp 16 | Wt 143.0 lb

## 2018-07-26 DIAGNOSIS — Z0000 Encounter for general adult medical examination without abnormal findings: Secondary | ICD-10-CM

## 2018-07-26 DIAGNOSIS — I429 Cardiomyopathy, unspecified: Secondary | ICD-10-CM

## 2018-07-26 NOTE — Assessment & Plan Note (Signed)
CBE performed. Deferred pelvic exam in the absence of complaints and pap utd. She has had both pneumococcal vaccines. Patient will schedule mammogram. Bone density scheduled.

## 2018-07-26 NOTE — Patient Instructions (Addendum)
Please call call and schedule your 3D mammogram  as discussed.   Today we discussed referrals, orders. colonoscopy   I have placed these orders in the system for you.  Please be sure to give [place] a call if you have not heard from our office regarding this. We should hear from [place] within ONE week with information regarding your appointment. If not, please let me know immediately.   Stay safe!   Health Maintenance for Postmenopausal Women Menopause is a normal process in which your ability to get pregnant comes to an end. This process happens slowly over many months or years, usually between the ages of 10 and 67. Menopause is complete when you have missed your menstrual periods for 12 months. It is important to talk with your health care provider about some of the most common conditions that affect women after menopause (postmenopausal women). These include heart disease, cancer, and bone loss (osteoporosis). Adopting a healthy lifestyle and getting preventive care can help to promote your health and wellness. The actions you take can also lower your chances of developing some of these common conditions. What should I know about menopause? During menopause, you may get a number of symptoms, such as:  Hot flashes. These can be moderate or severe.  Night sweats.  Decrease in sex drive.  Mood swings.  Headaches.  Tiredness.  Irritability.  Memory problems.  Insomnia. Choosing to treat or not to treat these symptoms is a decision that you make with your health care provider. Do I need hormone replacement therapy?  Hormone replacement therapy is effective in treating symptoms that are caused by menopause, such as hot flashes and night sweats.  Hormone replacement carries certain risks, especially as you become older. If you are thinking about using estrogen or estrogen with progestin, discuss the benefits and risks with your health care provider. What is my risk for heart disease and  stroke? The risk of heart disease, heart attack, and stroke increases as you age. One of the causes may be a change in the body's hormones during menopause. This can affect how your body uses dietary fats, triglycerides, and cholesterol. Heart attack and stroke are medical emergencies. There are many things that you can do to help prevent heart disease and stroke. Watch your blood pressure  High blood pressure causes heart disease and increases the risk of stroke. This is more likely to develop in people who have high blood pressure readings, are of African descent, or are overweight.  Have your blood pressure checked: ? Every 3-5 years if you are 51-83 years of age. ? Every year if you are 59 years old or older. Eat a healthy diet   Eat a diet that includes plenty of vegetables, fruits, low-fat dairy products, and lean protein.  Do not eat a lot of foods that are high in solid fats, added sugars, or sodium. Get regular exercise Get regular exercise. This is one of the most important things you can do for your health. Most adults should:  Try to exercise for at least 150 minutes each week. The exercise should increase your heart rate and make you sweat (moderate-intensity exercise).  Try to do strengthening exercises at least twice each week. Do these in addition to the moderate-intensity exercise.  Spend less time sitting. Even light physical activity can be beneficial. Other tips  Work with your health care provider to achieve or maintain a healthy weight.  Do not use any products that contain nicotine or tobacco,  such as cigarettes, e-cigarettes, and chewing tobacco. If you need help quitting, ask your health care provider.  Know your numbers. Ask your health care provider to check your cholesterol and your blood sugar (glucose). Continue to have your blood tested as directed by your health care provider. Do I need screening for cancer? Depending on your health history and family  history, you may need to have cancer screening at different stages of your life. This may include screening for:  Breast cancer.  Cervical cancer.  Lung cancer.  Colorectal cancer. What is my risk for osteoporosis? After menopause, you may be at increased risk for osteoporosis. Osteoporosis is a condition in which bone destruction happens more quickly than new bone creation. To help prevent osteoporosis or the bone fractures that can happen because of osteoporosis, you may take the following actions:  If you are 65-59 years old, get at least 1,000 mg of calcium and at least 600 mg of vitamin D per day.  If you are older than age 70 but younger than age 49, get at least 1,200 mg of calcium and at least 600 mg of vitamin D per day.  If you are older than age 71, get at least 1,200 mg of calcium and at least 800 mg of vitamin D per day. Smoking and drinking excessive alcohol increase the risk of osteoporosis. Eat foods that are rich in calcium and vitamin D, and do weight-bearing exercises several times each week as directed by your health care provider. How does menopause affect my mental health? Depression may occur at any age, but it is more common as you become older. Common symptoms of depression include:  Low or sad mood.  Changes in sleep patterns.  Changes in appetite or eating patterns.  Feeling an overall lack of motivation or enjoyment of activities that you previously enjoyed.  Frequent crying spells. Talk with your health care provider if you think that you are experiencing depression. General instructions See your health care provider for regular wellness exams and vaccines. This may include:  Scheduling regular health, dental, and eye exams.  Getting and maintaining your vaccines. These include: ? Influenza vaccine. Get this vaccine each year before the flu season begins. ? Pneumonia vaccine. ? Shingles vaccine. ? Tetanus, diphtheria, and pertussis (Tdap) booster  vaccine. Your health care provider may also recommend other immunizations. Tell your health care provider if you have ever been abused or do not feel safe at home. Summary  Menopause is a normal process in which your ability to get pregnant comes to an end.  This condition causes hot flashes, night sweats, decreased interest in sex, mood swings, headaches, or lack of sleep.  Treatment for this condition may include hormone replacement therapy.  Take actions to keep yourself healthy, including exercising regularly, eating a healthy diet, watching your weight, and checking your blood pressure and blood sugar levels.  Get screened for cancer and depression. Make sure that you are up to date with all your vaccines. This information is not intended to replace advice given to you by your health care provider. Make sure you discuss any questions you have with your health care provider. Document Released: 02/19/2005 Document Revised: 12/21/2017 Document Reviewed: 12/21/2017 Elsevier Patient Education  2020 [employer].

## 2018-07-26 NOTE — Progress Notes (Signed)
Subjective:    Patient ID: Robyn Montgomery, female    DOB: 1955-10-14, 63 y.o.   MRN: [id number]  CC: Robyn Montgomery is a 63 y.o. female who presents today for physical exam.    HPI: HPI  Feels well today No complaints  AML - s/p transplant. Continues to follow with [facility] oncology.   Colorectal Cancer Screening: due  Breast Cancer Screening: Mammogram due Cervical Cancer Screening: UTD Bone Health screening/DEXA for 65+:Scheduled at [facility] Lung Cancer Screening: Doesn't have 30 year pack year history and age > 22 years. Immunizations       Tetanus - utd        Pneumococcal - Completed; see reconciliation. She will need pneumovax in 2025 Labs: Screening labs today. Exercise: Gets regular exercise.  Alcohol use: occasional Smoking/tobacco use: Nonsmoker.  Wears seat belt: Yes.  HISTORY:  Past Medical History:  Diagnosis Date   Cancer ([place])    leukemia- post stem cell transplant   Leukemia ([place])    Osteoporosis    Personal history of chemotherapy     Past Surgical History:  Procedure Laterality Date   BREAST BIOPSY Left    mole removed above left nipple, no bx   broken shoulder Right 2012   Plates and screws put in   [place]   Removed from left breast   SHOULDER SURGERY     Family History  Problem Relation Age of Onset   Arthritis Mother    Hypertension Mother    Cancer Mother 51       Breast   Osteoporosis Mother    Breast cancer Mother 59   Parkinsonism Father    Heart disease Maternal Uncle    Cancer Paternal Grandfather        Oral      ALLERGIES: Dapsone, Rasburicase, Sulfasalazine, Penicillins, Sulfa antibiotics, Vancomycin, Cefepime, Morphine, Tape, and Tapentadol  No current outpatient medications on file prior to visit.   No current facility-administered medications on file prior to visit.     Social History   Tobacco Use   Smoking status: Never Smoker   Smokeless tobacco: Never  Used  Substance Use Topics   Alcohol use: Yes    Alcohol/week: 0.0 standard drinks    Comment: Rare   Drug use: No    Review of Systems  Constitutional: Negative for chills, fever and unexpected weight change.  HENT: Negative for congestion.   Respiratory: Negative for cough.   Cardiovascular: Negative for chest pain, palpitations and leg swelling.  Gastrointestinal: Negative for nausea and vomiting.  Musculoskeletal: Negative for arthralgias and myalgias.  Skin: Negative for rash.  Neurological: Negative for headaches.  Hematological: Negative for adenopathy.  Psychiatric/Behavioral: Negative for confusion.      Objective:    BP 120/78 (BP Location: Left Arm, Patient Position: Sitting, Cuff Size: Normal)    Pulse 87    Temp 98.2 F (36.8 C) (Oral)    Resp 16    Wt 143 lb (64.9 kg)    SpO2 98%    BMI 24.55 kg/m   BP Readings from Last 3 Encounters:  07/26/18 120/78  01/30/18 117/88  06/08/17 126/78   Wt Readings from Last 3 Encounters:  07/26/18 143 lb (64.9 kg)  01/30/18 140 lb (63.5 kg)  06/08/17 141 lb 6 oz (64.1 kg)    Physical Exam Vitals signs reviewed.  Constitutional:      Appearance: She is well-developed.  Eyes:  Conjunctiva/sclera: Conjunctivae normal.  Neck:     Thyroid: No thyroid mass or thyromegaly.  Cardiovascular:     Rate and Rhythm: Normal rate and regular rhythm.     Pulses: Normal pulses.     Heart sounds: Normal heart sounds.  Pulmonary:     Effort: Pulmonary effort is normal.     Breath sounds: Normal breath sounds. No wheezing, rhonchi or rales.  Chest:     Breasts: Breasts are symmetrical.        Right: No inverted nipple, mass, nipple discharge, skin change or tenderness.        Left: No inverted nipple, mass, nipple discharge, skin change or tenderness.  Lymphadenopathy:     Head:     Right side of head: No submental, submandibular, tonsillar, preauricular, posterior auricular or occipital adenopathy.     Left side of head:  No submental, submandibular, tonsillar, preauricular, posterior auricular or occipital adenopathy.     Cervical: No cervical adenopathy.     Right cervical: No superficial, deep or posterior cervical adenopathy.    Left cervical: No superficial, deep or posterior cervical adenopathy.  Skin:    General: Skin is warm and dry.  Neurological:     Mental Status: She is alert.  Psychiatric:        Speech: Speech normal.        Behavior: Behavior normal.        Thought Content: Thought content normal.        Assessment & Plan:   Problem List Items Addressed This Visit      Cardiovascular and Mediastinum   Secondary cardiomyopathy ([place])    No longer needing blood pressure medication. Well controlled today.        Other   Routine general medical examination at a health care facility - Primary    CBE performed. Deferred pelvic exam in the absence of complaints and pap utd. She has had both pneumococcal vaccines. Patient will schedule mammogram. Bone density scheduled.       Relevant Orders   TSH   CBC with Differential/Platelet   Comprehensive metabolic panel   Hemoglobin A1c   Lipid panel   VITAMIN D 25 Hydroxy (Vit-D Deficiency, Fractures)   MM 3D SCREEN BREAST BILATERAL   Ambulatory referral to Gastroenterology       I have discontinued [place] "Pam"'s metoprolol succinate, lisinopril, valACYclovir, and Calcium Carb-Cholecalciferol.   No orders of the defined types were placed in this encounter.   Return precautions given.   Risks, benefits, and alternatives of the medications and treatment plan prescribed today were discussed, and patient expressed understanding.   Education regarding symptom management and diagnosis given to patient on AVS.   Continue to follow with Burnard Hawthorne, FNP for routine health maintenance.   [place] and I agreed with plan.   Mable Paris, FNP

## 2018-07-26 NOTE — Assessment & Plan Note (Signed)
No longer needing blood pressure medication. Well controlled today.

## 2018-07-27 LAB — CBC WITH DIFFERENTIAL/PLATELET
Basophils Absolute: 0.1 10*3/uL (ref 0.0–0.1)
Basophils Relative: 1.2 % (ref 0.0–3.0)
Eosinophils Absolute: 0.1 10*3/uL (ref 0.0–0.7)
Eosinophils Relative: 2.5 % (ref 0.0–5.0)
HCT: 39.9 % (ref 36.0–46.0)
Hemoglobin: 13.4 g/dL (ref 12.0–15.0)
Lymphocytes Relative: 24.8 % (ref 12.0–46.0)
Lymphs Abs: 1.4 10*3/uL (ref 0.7–4.0)
MCHC: 33.6 g/dL (ref 30.0–36.0)
MCV: 98.4 fl (ref 78.0–100.0)
Monocytes Absolute: 0.6 10*3/uL (ref 0.1–1.0)
Monocytes Relative: 10 % (ref 3.0–12.0)
Neutro Abs: 3.5 10*3/uL (ref 1.4–7.7)
Neutrophils Relative %: 61.5 % (ref 43.0–77.0)
Platelets: 286 10*3/uL (ref 150.0–400.0)
RBC: 4.06 Mil/uL (ref 3.87–5.11)
RDW: 13.8 % (ref 11.5–15.5)
WBC: 5.7 10*3/uL (ref 4.0–10.5)

## 2018-07-27 LAB — VITAMIN D 25 HYDROXY (VIT D DEFICIENCY, FRACTURES): VITD: 35.57 ng/mL (ref 30.00–100.00)

## 2018-07-27 LAB — COMPREHENSIVE METABOLIC PANEL
ALT: 17 U/L (ref 0–35)
AST: 18 U/L (ref 0–37)
Albumin: 4.9 g/dL (ref 3.5–5.2)
Alkaline Phosphatase: 95 U/L (ref 39–117)
BUN: 12 mg/dL (ref 6–23)
CO2: 27 mEq/L (ref 19–32)
Calcium: 9.9 mg/dL (ref 8.4–10.5)
Chloride: 103 mEq/L (ref 96–112)
Creatinine, Ser: 0.82 mg/dL (ref 0.40–1.20)
GFR: 69.91 mL/min (ref >60.00)
Glucose, Bld: 85 mg/dL (ref 70–99)
Potassium: 4.4 mEq/L (ref 3.5–5.1)
Sodium: 140 mEq/L (ref 135–145)
Total Bilirubin: 0.7 mg/dL (ref 0.2–1.2)
Total Protein: 7.2 g/dL (ref 6.0–8.3)

## 2018-07-27 LAB — LIPID PANEL
Cholesterol: 281 mg/dL — ABNORMAL HIGH (ref 0–200)
HDL: 52.8 mg/dL (ref >39.00)
NonHDL: 228.25
Total CHOL/HDL Ratio: 5
Triglycerides: 231 mg/dL — ABNORMAL HIGH (ref 0.0–149.0)
VLDL: 46.2 mg/dL — ABNORMAL HIGH (ref 0.0–40.0)

## 2018-07-27 LAB — TSH: TSH: 1.01 u[IU]/mL (ref 0.35–4.50)

## 2018-07-27 LAB — LDL CHOLESTEROL, DIRECT: Direct LDL: 204 mg/dL

## 2018-07-27 LAB — HEMOGLOBIN A1C: Hgb A1c MFr Bld: 5.6 % (ref 4.6–6.5)

## 2018-08-07 ENCOUNTER — Encounter: Payer: Self-pay | Admitting: Family

## 2018-08-10 ENCOUNTER — Ambulatory Visit
Admission: RE | Admit: 2018-08-10 | Discharge: 2018-08-10 | Disposition: A | Discharge status: Home / Self Care | Payer: PPO | Source: Ambulatory Visit | Attending: Family | Admitting: Family

## 2018-08-10 ENCOUNTER — Other Ambulatory Visit: Payer: Self-pay

## 2018-08-10 DIAGNOSIS — Z1231 Encounter for screening mammogram for malignant neoplasm of breast: Secondary | ICD-10-CM | POA: Diagnosis not present

## 2018-08-10 DIAGNOSIS — Z0000 Encounter for general adult medical examination without abnormal findings: Secondary | ICD-10-CM

## 2018-09-06 ENCOUNTER — Ambulatory Visit: Admit: 2018-09-06 | Discharge: 2018-09-07 | Payer: PRIVATE HEALTH INSURANCE

## 2018-09-06 ENCOUNTER — Other Ambulatory Visit: Admit: 2018-09-06 | Discharge: 2018-09-07 | Payer: PRIVATE HEALTH INSURANCE

## 2018-09-06 DIAGNOSIS — Z9481 Bone marrow transplant status: Principal | ICD-10-CM

## 2018-09-06 DIAGNOSIS — T451X5A Adverse effect of antineoplastic and immunosuppressive drugs, initial encounter: Secondary | ICD-10-CM

## 2018-09-06 DIAGNOSIS — C9201 Acute myeloblastic leukemia, in remission: Secondary | ICD-10-CM

## 2018-09-06 DIAGNOSIS — M810 Age-related osteoporosis without current pathological fracture: Secondary | ICD-10-CM

## 2018-09-06 DIAGNOSIS — I427 Cardiomyopathy due to drug and external agent: Secondary | ICD-10-CM

## 2018-09-06 DIAGNOSIS — E289 Ovarian dysfunction, unspecified: Secondary | ICD-10-CM | POA: Diagnosis not present

## 2018-09-06 DIAGNOSIS — Z780 Asymptomatic menopausal state: Secondary | ICD-10-CM | POA: Diagnosis not present

## 2018-09-06 DIAGNOSIS — M85852 Other specified disorders of bone density and structure, left thigh: Secondary | ICD-10-CM | POA: Diagnosis not present

## 2018-09-06 DIAGNOSIS — M8588 Other specified disorders of bone density and structure, other site: Secondary | ICD-10-CM | POA: Diagnosis not present

## 2018-09-06 DIAGNOSIS — M8589 Other specified disorders of bone density and structure, multiple sites: Secondary | ICD-10-CM | POA: Diagnosis not present

## 2018-09-06 DIAGNOSIS — Z7983 Long term (current) use of bisphosphonates: Secondary | ICD-10-CM | POA: Diagnosis not present

## 2018-09-06 DIAGNOSIS — Z6825 Body mass index (BMI) 25.0-25.9, adult: Secondary | ICD-10-CM | POA: Diagnosis not present

## 2018-09-06 LAB — COMPREHENSIVE METABOLIC PANEL
ALKALINE PHOSPHATASE: 102 U/L (ref 38–126)
ALT (SGPT): 21 U/L (ref <35)
ANION GAP: 10 mmol/L (ref 7–15)
AST (SGOT): 36 U/L (ref 14–38)
BILIRUBIN TOTAL: 0.7 mg/dL (ref 0.0–1.2)
BLOOD UREA NITROGEN: 8 mg/dL (ref 7–21)
BUN / CREAT RATIO: 11
CALCIUM: 9.8 mg/dL (ref 8.5–10.2)
CHLORIDE: 105 mmol/L (ref 98–107)
CO2: 25 mmol/L (ref 22.0–30.0)
CREATININE: 0.74 mg/dL (ref 0.60–1.00)
EGFR CKD-EPI AA FEMALE: >90 mL/min/{1.73_m2} (ref >=60)
EGFR CKD-EPI NON-AA FEMALE: 85 mL/min/{1.73_m2} (ref >=60)
GLUCOSE RANDOM: 94 mg/dL (ref 70–179)
PROTEIN TOTAL: 7.2 g/dL (ref 6.5–8.3)
SODIUM: 140 mmol/L (ref 135–145)

## 2018-09-06 LAB — CBC W/ AUTO DIFF
BASOPHILS ABSOLUTE COUNT: 0 10*9/L (ref 0.0–0.1)
BASOPHILS RELATIVE PERCENT: 0.5 %
EOSINOPHILS ABSOLUTE COUNT: 0.2 10*9/L (ref 0.0–0.4)
EOSINOPHILS RELATIVE PERCENT: 4.3 %
HEMATOCRIT: 40.4 % (ref 36.0–46.0)
HEMOGLOBIN: 13 g/dL (ref 12.0–16.0)
LARGE UNSTAINED CELLS: 3 % (ref 0–4)
LYMPHOCYTES ABSOLUTE COUNT: 1.1 10*9/L — ABNORMAL LOW (ref 1.5–5.0)
LYMPHOCYTES RELATIVE PERCENT: 28.5 %
MEAN CORPUSCULAR HEMOGLOBIN CONC: 32.2 g/dL (ref 31.0–37.0)
MEAN CORPUSCULAR HEMOGLOBIN: 32.3 pg (ref 26.0–34.0)
MEAN PLATELET VOLUME: 8.1 fL (ref 7.0–10.0)
MONOCYTES ABSOLUTE COUNT: 0.3 10*9/L (ref 0.2–0.8)
MONOCYTES RELATIVE PERCENT: 7.2 %
NEUTROPHILS ABSOLUTE COUNT: 2.2 10*9/L (ref 2.0–7.5)
NEUTROPHILS RELATIVE PERCENT: 56.5 %
PLATELET COUNT: 271 10*9/L (ref 150–440)
RED CELL DISTRIBUTION WIDTH: 14.4 % (ref 12.0–15.0)
WBC ADJUSTED: 3.8 10*9/L — ABNORMAL LOW (ref 4.5–11.0)

## 2018-09-06 LAB — CO2: Carbon dioxide:SCnc:Pt:Ser/Plas:Qn:: 25

## 2018-09-06 LAB — MEAN CORPUSCULAR HEMOGLOBIN: Lab: 32.3

## 2018-09-06 NOTE — Unmapped (Signed)
BMT Clinic Follow-up    Patient Name: Christine [name]  MRN: [id number]  Encounter date: 09/06/2018    Primary Care Provider: Allegra Grana, NP  BMT Attending MD:  Lanae Boast, MD.   ??  HPI: Christine [name] is a 63 y.o. 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 2 yr 6 mo after transplant.    Tacrolimus stop date 06/15/16.    ??  Interval History:  Ms. Christine [name] presents today with her husband. She is feeling well today. She has fully recovered from her appendectomy and abscess. Denies interim infections. Denies fever, chills, n/v/d. She if off all medications. She continues to be able to perform all activities without any limitations.    Oncology History Overview Note   Referring/Local Oncologist: none    Diagnosis: de novo AML with mutated FLT3    Genetics:   Karyotype/FISH: 47XX +11     Molecular Genetics: FLT3 ITD present; IDH2: c.419G>A [p.Arg140Gln]  ??  No variants detected in ASXL1, BCOR, CEBPA, DNMT3A, ETV6/TEL, EZH2, FLT3, IDH1, KIT, NPM1, NRAS, RUNX1, SF3B1, SRSF2, STAG2, TET2, TP53, U2AF1, WT1, ZRSR2    Pertinent Phenotypic data: blasts expressed CD4, CD11b, CD11c, CD33, CD34, CD64, CD117, and HLA-DR and lack expression of other informative analyzed markers including B and T-cell antigens.     Disease-specific prognostic estimation: ELN Adverse (per FLT3 allelic ratio high per estimate by molecular pathologist)       Acute myeloid leukemia (CMS-HCC)   08/29/2015 Initial Diagnosis    Acute myeloid leukemia (RAF-HCC)     09/02/2015 -  Chemotherapy    Induction therapy with daunorubicin 60 mg/m2 IV daily x 3, cytarabine 200 mg/m2/d CIV x 7 days, midostaurin 50 mg PO BID days 8-21     09/22/2015 Biopsy    50% cellular marrow with 50% blasts     09/25/2015 -  Chemotherapy    CLAG, cladribine 5 mg/m2/day days 1-5, cytarabine 2 grams/m2 IV days 1-5, GCSF days 0-5     10/23/2015 Remission    CR     11/03/2015 - Chemotherapy    Cytarabine 1.5 g/m2 IV Q12 hrs days 1, 3, 5     11/04/2015 Adverse Reaction    Cardiomyopathy     11/28/2015 Adverse Reaction    Stenotrophomonas maltophilia CVAD infection     12/19/2015 -  Chemotherapy    Cytarabine 1.5 g/m2 IV Q12 hrs days 1, 3, 5     02/24/2016 Transplant    CTN 1301 (control arm) conditioning regimen with Busulfan and Fludarabine followed by matched sibling allo, received bone marrow product with cell dose of 3.18 x 10^6.        Patient Active Problem List   Diagnosis   ??? Acute deep vein thrombosis (DVT) of left lower extremity (CMS-HCC)   ??? Osteoporosis (RAF-HCC)   ??? Acute myeloid leukemia (CMS-HCC)   ??? Heart failure (CMS-HCC)   ??? 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 LLE distal DVT  Chronic PE on 09/10/15 CTA   ??? Allogeneic stem cell transplant (RAF-HCC)   ??? Hypomagnesemia   ??? Immunocompromised state (CMS-HCC)   ??? Pancytopenia (CMS-HCC)   ??? Immunization due   ??? Pneumonia of both lower lobes due to infectious organism   ??? 11/08/2017: Lap appendectomy fo Acute perforated appendicitis     Review of Systems:  A full system review was performed and was negative except as noted in  the above interval history.    **Reviewed and updated past medical, surgical, social, and family history as appropriate.      Allergies   Allergen Reactions   ??? Dapsone      Relative G6PD deficiency (lvl=5.1, lower limit of normal is 5.3)   ??? Penicillins Rash and Hives   ??? Rasburicase      Relative G6PD deficiency (lvl=5.1, lower limit of normal is 5.3)   ??? Sulfasalazine Hives   ??? Vancomycin Other (See Comments)     Avoid if possible, but if needed: Red man syndrome- slow rate over 2 hours, benadryl and ranitidine premed   ??? Adhesive Rash     Uses mepore     ??? Amoxicillin Rash   ??? Cefepime Rash     Can trial again while taking with antihistamine medications/antihistamine premeds    06/10/17: Has successfully tolerated cefepime with benadryl 25 PO as premed    ??? Morphine Rash     Can trial again while taking with antihistamine medications/antihistamine premeds (fentanyl may be better alternative if possible)   ??? Tapentadol Rash     Uses mepore     No current outpatient medications on file.     No current facility-administered medications for this visit.      Facility-Administered Medications Ordered in Other Visits   Medication Dose Route Frequency Provider Last Rate Last Dose   ??? flu vacc qs2019-20 6mos up(PF) (FLUARIX, FLULAVAL, FLUZONE) 60 mcg (15 mcg x 4)/0.5 mL injection            ??? varicella-zoster gE-AS01B (PF) (SHINGRIX) 50 mcg/0.5 mL injection              Physical Exam:  BP 130/87  - Pulse 68  - Temp 36.9 ??C (98.4 ??F) (Temporal)  - Resp 16  - Ht 162.6 cm (5' 4)  - Wt 68.1 kg (150 lb 3.2 oz)  - LMP  (LMP Unknown)  - SpO2 99%  - BMI 25.78 kg/m??   General : Mid aged female, well appearing, in no acute distress.   Head: Normocephalic, atraumatic.     ENT: Anicteric, Moist mucous membranes. No lesions or erythema.  Cardiovascular: Pulse normal rate, regularity and rhythm. S1 and S2 normal.  Lungs: Clear to auscultation bilaterally, without wheezes/crackles/rhonchi. Good air movement.   Skin: Warm, dry, intact. No rash noted.   Psychiatry: Alert and oriented to person, place, and time.   Abdomen : Normoactive bowel sounds, abdomen soft, non-tender   Extremeties: No edema.   Musculo Skeletal: Full range of motion in shoulder, elbow, hip knee, ankle, left hand and feet.  Neurologic: CNII-XII grossly intact. Normal strength and sensation throughout    Test Results:   Lab Results   Component Value Date    WBC 3.8 (L) 09/06/2018    HGB 13.0 09/06/2018    HCT 40.4 09/06/2018    PLT 271 09/06/2018       Lab Results   Component Value Date    NA 140 09/06/2018    K 5.0 09/06/2018    CL 105 09/06/2018    CO2 25.0 09/06/2018    BUN 8 09/06/2018    CREATININE 0.74 09/06/2018    GLU 94 09/06/2018    CALCIUM 9.8 09/06/2018    MG 2.1 11/24/2017    PHOS 4.0 11/24/2017       Lab Results   Component Value Date    BILITOT 0.7 09/06/2018    BILIDIR <0.10 04/15/2016    PROT  7.2 09/06/2018    ALBUMIN 4.5 09/06/2018    ALT 21 09/06/2018    AST 36 09/06/2018    ALKPHOS 102 09/06/2018    GGT 115 (H) 08/10/2016       Lab Results   Component Value Date    INR 1.06 11/24/2017    APTT 24.3 (L) 04/15/2016     Assessment/Plan:    **AML:??(FLT3-ITD, +11, and IDH2)??in CR prior to transplant.   06/22/16: BMBx:   - ??Normocellular bone marrow (40%) with TLH and 3% blasts.  - ??Routine cytogenetics: donor karyotype; FISH (trisomy 11) results are normal.  - ??Flow cytometric MRD results reveal no evidence of aberrant myeloid antigen expression or abnormal myeloblasts.  - ??DNA Chimerism see below.  - Considered??maintenance with FLT3 inhibitor??(sorafenib). Was reluctant to start around D+45-60 due to pancytopenia and changing chimerism. Decided against it. 10 months after transplant maintenance was discussed again - decided against it.     03/15/18: BMBx (2 years):  -  Normocellular bone marrow (40%) with TLH and 1% blasts.  - ??Routine cytogenetics: donor karyotype; FISH (trisomy 11) results are normal.  - ??Flow cytometric MRD results reveal no evidence of aberrant myeloid antigen expression or abnormal myeloblasts.  - ??DNA Chimerism see below.  ??  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 ??  Conditioning:??  ????????????????????????Busulfan PK calculated from test dose 80 mg/hr daily and Fludarabine 40 mg/m2 daily day -5 thru day -2.  ????????????????????????Day 0 -??02/24/16  Cell dose:??3.24 x10^8 TNC (Marrow)  Chimerisms/Marrow:  03/31/16: Day 30 marrow normocellular w/1% blasts. FISH and cytos are normal. MRD negative.   BM Chimerisms show 75% donor CD3 and >95% donor in unfractionated compartment.   04/22/16: PB Chimerisms show 71% donor CD3 and >95% donor unfractionated.   05/05/16: BM Chimerisms show 65% donor CD3 and >95% donor unfractionated.   05/18/16: PB Chimerisms show 74% donor CD3 and 95% donor unfractionated. 06/01/16: PB Chimerisms show 69% donor CD3 and >95% donor unfractionated.  06/22/16: PB Chimerisms show 80% donor CD3 and >95% donor unfractionated  06/22/16: BM Chimerisms show 79% donor CD3 and >95% donor unfractionated.   07/06/16: PB Chimerisms show 77% donor CD3 and >95% donor unfractionated.  07/13/16: Off tac x 4 weeks.??Chimerism check next visit.  If no full chimerism at 8-10 weeks after discontinuation of tac will consider DLI. We do not have any donor cells stored. If we need to recollect for DLI, will consider mobilizing the donor and collecting CD3+ cells for DLI as well as CD34+ cells for another transplant.   07/26/16: Off tac almost 6 weeks. PB Chimerisms show 74% donor in CD3 and >95% donor unfractionated.   08/10/16: Off tac 8 weeks. Repeat DNA shows slight increase in CD3 to 79%. Plan after discussion with Dr. Oswald Hillock to re-collect sister for more donor cells for DLI. However, today with??elevated LFTs, this is new, will need to follow these and make sure no GVHD prior to contacting sister for further donation. This was discussed with Pam today.   08/18/16: Off tac 9 weeks.??PB Chimerisms show??85% donor CD3 and >95% donor unfractionated.  08/24/16: Off tac 10 weeks. BM Chimerisms show 79% donor CD3 and >95% donor unfractionated.  09/07/16: Off tac 12 weeks. PB Chimerisms show 91% donor CD3 and >95% donor unfractionated.??  09/28/16: PB chimerism 94% donor CD3 and >95% donor unfractionated.  10/26/16: PB chimerism 90% donor CD3 and >95% donor unfractionated.??  11/23/16: PB chimerism 95% donor CD3  and >95% donor unfractionated.??  12/22/16: PB chimerism 94% donor CD3 and >95% donor unfractionated.??  01/18/17: PB chimerism 93% donor CD3 and >95% donor unfractionated.??  02/15/17: PB chimerism 95% donor CD3 and >95% donor unfractionated.??  03/15/17: PB chimerism 95% donor CD3 and >95% donor unfractionated.??  04/12/17: PB chimerism 95% donor CD3 and >95% donor unfractionated.??  05/17/17: PB chimerism 95% donor CD3 and >95% donor unfractionated.??  06/21/17: PB chimerism >95% donor CD3 and >95% donor unfractionated.??  08/17/17:  PB chimerism >95% donor CD3 and >95% donor unfractionated.??  09/21/17: PB chimerism >95% donor CD3 and >95% donor unfractionated.??  11/30/17: PB chimerism 95% donor CD3 and >95% donor unfractionated.??  12/20/17: PB chimerism 95% donor CD3 and >95% donor unfractionated.??  03/01/18: PB chimerism 95% donor CD3 and >95% donor unfractionated.??  07/04/18: PB chimerism 94% donor CD3 and >95% donor unfractionated.??  07/12/18: PB chimerism >95% donor CD3 and >95% donor unfractionated.??  09/06/18: Pending.     GVHD Prophy:??  - Methotrexate on day +1, 3, 6, 11 (received all 4 doses)  - Tac tapered and eventually DC'd 06/15/16????  - ??Has had no e/o GVHD,developed new elevated LFTs noted at visit on 7/31. Liver US(08/18/16): 1.Patent hepatic vasculature with normal flow direction 2.Large simple hepatic cysts.3. Stone in gallbladder.   - 01/18/17: LFTs continue to improve.   - 09/06/18: LFT WNL  ??  Heme:??CBC stable.   Transfusion criteria: * Pre-med with tylenol and Zyrtec pre- blood or platelet transfusion.   - Hgb <8 and Plt <10K or bleeding.   - Granix if ANC <0.5.   - 07/04/18: ANC 1.9. Has been waxing and waning. Will repeat next week.   ??  Hx of DVT:  - Completed 6 mos or anticoagulation.     **ID:??Afeb.   Sore Throat  - Will check respiratory panel today-negative    Acute appendicitis and serositis, complicated by abscess. Has a drain in place. Completed a few days of antibiotics prescribed by surgery.   -Fully recovered    Prophylaxis:   - Continue Valtrex for Zoster prophy 500 mg qd.   - Bactrim stopped due to pancytopenia. Allergic to dapsone. Switched to Pentamidine (Dose #1 - 5/8). Continues on monthly Pentamidine given low CD4 count.   - 07/12/17: Will DC pentamidine.   - Vaccines as below.  ??  Low CD4  - 06/30/16: Off IS. CD4 count 126.   - 09/07/16: CD4 count 137. ??  - 09/28/16: Received Pentamidine today. Plan to check CD4 level at next visit and continue with monthly pentamidine.   - 10/26/16: CD4 count 180. Continue monthly pentamidine.   - 11/23/16: CD4 count 167. Continue monthly pentamidine.   - 12/22/16: Continue monthly pentamidine (today).  - 01/19/16: Pentamidine due today.    - 02/15/17: Repeat CD4 count 188. Continue pentamidine.   - 03/15/17: Received dose today.   - 04/12/17: Received dose today.   - 05/17/17: CD4 count 216. Received pentamidine today. Will discuss restarting Bactrim DS on Sat and Sun.  - 06/21/17: Pentamidine today. After recovery from cold will discuss restarting Bactrim DS on Sat and Sun.  - 07/12/17: Will DC pentamidine.   -03/01/18: CD4 count 334.   ??  Viral studies:  - 11/23/16: Viral panels have been negative. Will stop monitoring unless clinically indicated.    - 03/15/17: [facility] Respiratory virus panel positive for rhinovirus.     - 06/08/17: Parainfluenza pneumonia. IgG on 5/30 was 630.   - 07/12/17: Fully recovered.   ??  Vaccine:   - 09/07/16: First set of post-transplant vaccines. Next set due in the end of February - next visit   Will also consider Shingrix - likely after her immunity is a bit better.    - 03/15/17: Delay vaccines due to cold symptoms. Will plan on the next dose of post-transplant vaccines and Shingrix next visit.   - 04/12/17: Today 12 months post transplant vaccines. First dose of Shingrix vaccine. Next set in 10/2017.   - 12/20/17:  Shingrix given today as well as flu shot.   - 02/01/18: Valtrex completed last week. S/P Shingrix X 2. No further Valtrex needed.   -03/01/18: Completed post transplant vaccines today.    **CV:   Hx of anthracycline induced cardiomyopathy:  - Nov 2017 w/EF 35%. This has now resolved with 01/2016 Echo showing estimated EF of 50%.   - Treated with lisinopril and metoprolol (Metoprolol 50mg  XL).   - Now off all antihypertensives due to intolerance (hypotension).   - 03/15/17: BP WNL. Asymptomatic.   - 04/12/17: Patient reported that her PCP checked her cholesterol and it was borderline high 239. She is wondering if she should take a lipid lowering agents recommended by her PCP. I advised her to discuss this with Dr. Barbette Merino whom she is scheduled to see in a few months.   ??  HTN:??Lisinopril stopped d/t AKI.   - 04/07/16: D/C'd Norvasc. Due to asymptomatic hypotension. Pre-transplant blood pressures run in the 100/70-80 range per her report.   Per Dr. Barbette Merino, ok to stop metoprolol given low BPs with plan to repeat Echo in 6-8 weeks.  - 07/06/16: Baseline ECHO today showed EF of 45-50%. DC metoprolol.   - 07/13/16: Follow BP. Will repeat ECHO 8-12 weeks after stopping metoprolol. Repeat ECHO scheduled for 10/26/2016.  - 10/26/16: Repeat ECHO showed normal EF. Ms. Paz saw Ramond Marrow who decided not to restart ACE inhibitor or beta blocker. Recommended annual follow up with ECHO of the heart.    -03/01/18: Scheduled with Dr. Barbette Merino-  delayed due to COVID19.   - 07/04/18: Needs to reschedule follow up with Ramond Marrow.     **GI/Hepatic:??  - Completed VOD prophylaxis.   - Elevated LFTs:??Mild elevation at visit on 07/26/16. 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   ---??08/10/16 AST and ALT now 107/170 respectively. Normal amylase and lipase. No abdominal pain or GI symptoms.   ---??08/13/16 ????AST 191 and ALT 283. Alk phos 177.  ---??08/18/16 ??AST 144 and ALT 264. Alk phos 218. Liver US: with patent hepatic vasculature notable for large simple hepatic cyst and gallstones.  ---??09/07/16: AST 136 and ALT 242. Alk phos 197. Stable.   ---??09/28/16: AST 91 and ALT 159. Alk phos 128. LFTs continue to improve.   --- 10/26/16: AST 84 and ALT 139. Alk phos 114. LFTs continue to improve.   --- 11/23/16:  AST 64 and ALT 102. Alk phos 96. LFTs continue to improve.   --- 12/22/16:  AST 51 and ALT 72. Alk phos 79. LFTs continue to improve.   --- 01/18/17: AST 42 and ALT 53. Alk phos 75. LFTs continue to improve.    --- 02/15/17: AST 47 and ALT 53. Alk phos 71. Stable.   --- 06/21/17 - present: LFT WNL.   ??  **Bone Health:  - 03/31/16: DEXA scan showed osteoporosis of the spine and osteopenia of the femoral neck. Zometa and Ca/Vit D.   - 04/02/16: Received 1st dose of Zometa.   - 07/06/16. 2nd dose of Zometa. Completed. Continue Ca/Vit D  supplement.   - 03/15/17: Patient received two doses of Zometa. Continue Ca/Vit D supplement. Vit D 38.9 WNL.   - 07/04/18: Will schedule Dexa scan and Zometa. Will coordinate with next visit.  - 09/06/18: Dexa scan pending today.   ??  Plan:   - 2 yr 6 mo. Clinically doing well. ANC today 2.2. Chimerism stable. Continue to follow counts and chimerism every 2-3 months.   - PFTs on 03/01/18: continue to improve.    - Patient has osteoporosis with diagnosis preceding allo-SCT. Repeat dexa scan today - will likely need another dose of Zometa.    - Follow up with Ramond Marrow for cardiomyopathy.           Willaim Bane, MD  [facility]

## 2018-09-28 DIAGNOSIS — H02055 Trichiasis without entropian left lower eyelid: Secondary | ICD-10-CM | POA: Diagnosis not present

## 2018-09-28 DIAGNOSIS — H2513 Age-related nuclear cataract, bilateral: Secondary | ICD-10-CM | POA: Diagnosis not present

## 2018-10-23 ENCOUNTER — Ambulatory Visit: Payer: BLUE CROSS/BLUE SHIELD | Admitting: *Deleted

## 2018-11-28 ENCOUNTER — Ambulatory Visit
Admit: 2018-11-28 | Discharge: 2018-11-28 | Payer: PRIVATE HEALTH INSURANCE | Attending: Cardiovascular Disease | Primary: Cardiovascular Disease

## 2018-11-28 ENCOUNTER — Ambulatory Visit: Admit: 2018-11-28 | Discharge: 2018-11-28 | Payer: PRIVATE HEALTH INSURANCE

## 2018-11-28 DIAGNOSIS — I427 Cardiomyopathy due to drug and external agent: Principal | ICD-10-CM

## 2018-11-28 DIAGNOSIS — C9201 Acute myeloblastic leukemia, in remission: Principal | ICD-10-CM

## 2018-11-28 DIAGNOSIS — Z6826 Body mass index (BMI) 26.0-26.9, adult: Secondary | ICD-10-CM | POA: Diagnosis not present

## 2018-11-28 DIAGNOSIS — Z9221 Personal history of antineoplastic chemotherapy: Secondary | ICD-10-CM | POA: Diagnosis not present

## 2018-11-28 NOTE — Unmapped (Signed)
Cardio-oncology Clinic Return Patient/Consult Note    Referring Provider: Benita Gutter  [facility]   Primary Provider: Allegra Grana, NP   [address] [facility]  [place] [address]       Reason for Visit:  Christine [name] is a 63 y.o. female who returns for ongoing evaluation and management of cardiomyopathy diagnosed in the setting of treatment for AML.    Assessment & Plan:  1. Cardiomyopathy  The etiology of her cardiomyopathy is not completely certain but given the timing of its onset seems very likely related to receiving daunorubicin. Ischemia could also be considered in a 63 year-old woman, but she has no other risk factors for CAD and no symptoms to suggest ischemia. Her ejection fraction improved steadily and again appears normal on today's echocardiogram. She did not tolerate neurohormonal antagonists due to symptomatic hypotension. As such, and in light of the fact that she is asymptomatic with excellent functional status, I will not resume ACE inhibitor or beta blocker at this time.    Plan echocardiogram in 1 year. If EF remains normal at that time will plan to decrease frequency of surveillance.    2. Left bundle branch block  Transient during chemotherapy    3. Acute myeloid leukemia  S/p SCT  Doing quite well with good therapeutic response. Ongoing management per Cancer Center team.    Return to clinic in 12 months for an echo, sooner if needed    History of Present Illness: Christine [name] returns for ongoing evaluation and management of cardiomyopathy in the setting of treatment for AML. Christine [name] has no history of heart disease of any sort. She always has been very physically active. Prior to diagnosis of AML she regularly walked 3 miles without any chest pain or shortness of breath. She underwent stem cell transplant on February 24, 2016. Christine [name] is doing extremely well today. Her energy level has returned to normal. 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 Prior to her recent illness, she exercised six days per week including walking (3 miles), step aerobics and light strength exercises. She denies orthopnea, PND, abdominal fullness and LE edema. She denies chest pain, palpitations, presyncope and syncope.              Cardiovascular History & Procedures:  Cath / PCI:  None  CV Surgery:  None  EP Procedures and Devices:  None    Non-Invasive Evaluation(s):  Echo:  08/28/15  ?? Normal left ventricular systolic function, ejection fraction > 55%  ?? Dilated left atrium - mild  ?? Normal right ventricular systolic function  ?? No significant valvular abnormalities  ?? Degenerative mitral valve disease  ?? Mitral regurgitation - mild    11/04/15  ?? Left ventricular hypertrophy - mild  ?? EF estimated 45-50%, with septal dyskinesis likely due to new LBBB, this is a new finding compared to echo from 08/2015  ?? Diastolic dysfunction - grade II (elevated filling pressures)  ?? Degenerative mitral valve disease  ?? Dilated left atrium - mild  ?? Aortic sclerosis  ?? Normal right ventricular systolic function    12/03/15  ?? Limited study to evaluate ventricular function  ?? Dilated left ventricle - mild  ?? Moderately decreased left ventricular systolic function, ejection fraction 35%  ?? Diastolic dysfunction - grade I (normal filling pressures)  ?? Mitral regurgitation - mild  ?? Normal right ventricular systolic function  ?? Pericardial effusion -  small    10/26/16 ?? Limited study to evaluate ventricular function  ?? Borderline left ventricular systolic function  ?? Normal right ventricular systolic function    11/29/17  ?? Limited study to evaluate ventricular function  ?? Normal left ventricular systolic function, ejection fraction 55 to 60%  ?? Diastolic dysfunction - grade I (normal filling pressures)  ?? Normal right ventricular systolic function       CT/MRI/Nuclear Tests:  None       Past Medical History:   Diagnosis Date   ??? 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 AML (acute myelogenous leukemia) (CMS-HCC)    ??? 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 DVT (deep venous thrombosis) (CMS-HCC)    ??? Hypertension    ??? Osteoporosis     Stopped medications due to interactions with chemo drugs      Oncology History Overview Note   Referring/Local Oncologist: none    Diagnosis: de novo AML with mutated FLT3    Genetics:   Karyotype/FISH: 47XX +11     Molecular Genetics: FLT3 ITD present; IDH2: c.419G>A [p.Arg140Gln]  ??  No variants detected in ASXL1, BCOR, CEBPA, DNMT3A, ETV6/TEL, EZH2, FLT3, IDH1, KIT, NPM1, NRAS, RUNX1, SF3B1, SRSF2, STAG2, TET2, TP53, U2AF1, WT1, ZRSR2    Pertinent Phenotypic data: blasts expressed CD4, CD11b, CD11c, CD33, CD34, CD64, CD117, and HLA-DR and lack expression of other informative analyzed markers including B and T-cell antigens.     Disease-specific prognostic estimation: ELN Adverse (per FLT3 allelic ratio high per estimate by molecular pathologist)       Acute myeloid leukemia (CMS-HCC)   08/29/2015 Initial Diagnosis    Acute myeloid leukemia (RAF-HCC)     09/02/2015 -  Chemotherapy Induction therapy with daunorubicin 60 mg/m2 IV daily x 3, cytarabine 200 mg/m2/d CIV x 7 days, midostaurin 50 mg PO BID days 8-21     09/22/2015 Biopsy    50% cellular marrow with 50% blasts     09/25/2015 -  Chemotherapy    CLAG, cladribine 5 mg/m2/day days 1-5, cytarabine 2 grams/m2 IV days 1-5, GCSF days 0-5     10/23/2015 Remission    CR     11/03/2015 -  Chemotherapy    Cytarabine 1.5 g/m2 IV Q12 hrs days 1, 3, 5     11/04/2015 Adverse Reaction    Cardiomyopathy     11/28/2015 Adverse Reaction    Stenotrophomonas maltophilia CVAD infection     12/19/2015 -  Chemotherapy    Cytarabine 1.5 g/m2 IV Q12 hrs days 1, 3, 5     02/24/2016 Transplant    CTN 1301 (control arm) conditioning regimen with Busulfan and Fludarabine followed by matched sibling allo, received bone marrow product with cell dose of 3.18 x 10^6.            Past Surgical History:   Procedure Laterality Date   ??? IR INSERT PORT AGE GREATER THAN 5 YRS  12/19/2015    IR INSERT PORT AGE GREATER THAN 5 YRS 12/19/2015 Ammie Dalton, MD IMG VIR MM MMNT   ??? PR LAP,APPENDECTOMY N/A 11/08/2017    Procedure: LAPAROSCOPY SURGICAL APPENDECTOMY;  Surgeon: Suella Broad, MD;  Location: MAIN OR [facility];  Service: General Surgery   ??? PR UPPER GI  ENDOSCOPY,BIOPSY N/A 05/05/2016    Procedure: UGI ENDOSCOPY; WITH BIOPSY, SINGLE OR MULTIPLE;  Surgeon: Jolyn Lent, MD;  Location: GI PROCEDURES [facility];  Service: Gastroenterology   ??? SHOULDER SURGERY         Allergies:  Dapsone, Penicillins, Rasburicase, Sulfasalazine, Vancomycin, Adhesive, Amoxicillin, Cefepime, Morphine, and Tapentadol    Current Medications:  No current outpatient medications on file.     No current facility-administered medications for this visit.      Facility-Administered Medications Ordered in Other Visits   Medication Dose Route Frequency Provider Last Rate Last Dose   ??? flu vacc qs2019-20 6mos up(PF) (FLUARIX, FLULAVAL, FLUZONE) 60 mcg (15 mcg x 4)/0.5 mL injection ??? varicella-zoster gE-AS01B (PF) (SHINGRIX) 50 mcg/0.5 mL injection                Family History:  There is no family history of premature coronary artery disease or sudden cardiac death.     Social history:  Social History     Socioeconomic History   ??? Marital status: Married     Spouse name: None   ??? Number of children: None   ??? Years of education: None   ??? Highest education level: None   Occupational History   ??? None   Social Needs   ??? Financial resource strain: None   ??? Food insecurity     Worry: None     Inability: None   ??? Transportation needs     Medical: None     Non-medical: None   Tobacco Use   ??? Smoking status: Never Smoker   ??? Smokeless tobacco: Never Used   Substance and Sexual Activity   ??? Alcohol use: No   ??? Drug use: No   ??? Sexual activity: None   Lifestyle   ??? Physical activity     Days per week: None     Minutes per session: None   ??? Stress: None   Relationships   ??? Social Wellsite geologist on phone: None     Gets together: None     Attends religious service: None     Active member of club or organization: None     Attends meetings of clubs or organizations: None     Relationship status: None   Other Topics Concern   ??? 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      Highest level of education: high school       Review of Systems:  A full review of 10 systems is unremarkable except as stated in the HPI.     Physical Exam:  VITAL SIGNS:   Vitals:    11/28/18 0827   BP: 124/78   Pulse: 74   Temp: 36.6 ??C (97.9 ??F)   SpO2: 98%      Wt Readings from Last 3 Encounters:   11/28/18 68.7 kg (151 lb 8 oz)   09/06/18 68.1 kg (150 lb 3.2 oz)   07/05/18 66.9 kg (147 lb 6.4 oz)      Today's Body mass index is 26 kg/m??.   I performed a physical exam today (11/29/17). It was unchanged and is documented accurately. GENERAL: well-appearing in no acute  distress  HEENT: Normocephalic and atraumatic with anicteric sclerae    NECK: Supple, without lymphadenopathy or thyromegaly. JVP flat. There are no carotid bruits  CARDIOVASCULAR: Regular S1S2 without murmur or gallop  RESPIRATORY: Clear to auscultation without wheezes or rales.   ABDOMEN: Soft, non-tender, non-distended with audible bowel sounds. There is no organomegaly or palpable pulsatile mass.   EXTREMITIES:  Lower extremities are warm and without edema. Distal pulses are full and symmetric.   SKIN: No rashes, ecchymosis or petechiae.  NEURO: Alert, pleasant, and appropriate. Non-focal neuro exam    Pertinent Laboratory Studies:   Lab Results   Component Value Date    PRO-BNP 400.0 (H) 06/08/2017    PRO-BNP 1,120.0 (H) 11/04/2015    Creatinine 0.74 09/06/2018    Creatinine 0.75 07/12/2018    BUN 8 09/06/2018    BUN 14 07/12/2018    Potassium 5.0 09/06/2018    Potassium 4.5 07/12/2018    Potassium, Bld 3.4 06/08/2017    Magnesium 2.1 11/24/2017    Magnesium 2.0 11/13/2017    AST 36 09/06/2018    AST 25 07/12/2018    ALT 21 09/06/2018    ALT 19 07/12/2018    Total Bilirubin 0.7 09/06/2018    Total Bilirubin 0.4 07/12/2018    INR 1.06 11/24/2017    INR 1.22 04/15/2016    WBC 3.8 (L) 09/06/2018    WBC <0.1 (LL) 03/05/2016    HGB 13.0 09/06/2018    Hemoglobin 10.9 (L) 06/08/2017    HCT 40.4 09/06/2018    Platelet 271 09/06/2018       Other pertinent records were reviewed.    Pertinent Test Results from Today:  Echo (prelim): EF 55-60%

## 2018-11-28 NOTE — Unmapped (Signed)
Great to see you today. Glad to know that things are going so well for you.    We will plan to see you again in 1 year, at which time we will perform an echocardiogram. If the heart continues to look normal at that time we might consider spreading our visits out to every two years.    Please don't hesitate to contact me via MyChart or telephone  with questions or concerns that arise.

## 2018-11-30 NOTE — Unmapped (Signed)
Addendum created to file missing facility fee

## 2018-12-06 ENCOUNTER — Other Ambulatory Visit: Admit: 2018-12-06 | Discharge: 2018-12-07 | Payer: PRIVATE HEALTH INSURANCE

## 2018-12-06 ENCOUNTER — Ambulatory Visit: Admit: 2018-12-06 | Discharge: 2018-12-07 | Payer: PRIVATE HEALTH INSURANCE

## 2018-12-06 ENCOUNTER — Other Ambulatory Visit: Payer: Self-pay

## 2018-12-06 DIAGNOSIS — C9201 Acute myeloblastic leukemia, in remission: Principal | ICD-10-CM

## 2018-12-06 DIAGNOSIS — Z9481 Bone marrow transplant status: Secondary | ICD-10-CM | POA: Diagnosis not present

## 2018-12-06 DIAGNOSIS — M810 Age-related osteoporosis without current pathological fracture: Secondary | ICD-10-CM | POA: Diagnosis not present

## 2018-12-06 DIAGNOSIS — Z6826 Body mass index (BMI) 26.0-26.9, adult: Secondary | ICD-10-CM | POA: Diagnosis not present

## 2018-12-06 DIAGNOSIS — D849 Immunodeficiency, unspecified: Secondary | ICD-10-CM | POA: Diagnosis not present

## 2018-12-06 DIAGNOSIS — Z48290 Encounter for aftercare following bone marrow transplant: Secondary | ICD-10-CM | POA: Diagnosis not present

## 2018-12-06 DIAGNOSIS — Z9484 Stem cells transplant status: Secondary | ICD-10-CM | POA: Diagnosis not present

## 2018-12-06 LAB — ALT (SGPT): Alanine aminotransferase:CCnc:Pt:Ser/Plas:Qn:: 18

## 2018-12-06 LAB — COMPREHENSIVE METABOLIC PANEL
ALBUMIN: 4.7 g/dL (ref 3.5–5.0)
ALKALINE PHOSPHATASE: 96 U/L (ref 38–126)
ALT (SGPT): 18 U/L (ref <35)
ANION GAP: 11 mmol/L (ref 7–15)
AST (SGOT): 23 U/L (ref 14–38)
BILIRUBIN TOTAL: 0.5 mg/dL (ref 0.0–1.2)
BLOOD UREA NITROGEN: 13 mg/dL (ref 7–21)
BUN / CREAT RATIO: 17
CALCIUM: 10 mg/dL (ref 8.5–10.2)
CHLORIDE: 105 mmol/L (ref 98–107)
CO2: 26 mmol/L (ref 22.0–30.0)
CREATININE: 0.76 mg/dL (ref 0.60–1.00)
EGFR CKD-EPI AA FEMALE: >90 mL/min/{1.73_m2} (ref >=60)
EGFR CKD-EPI NON-AA FEMALE: 83 mL/min/{1.73_m2} (ref >=60)
GLUCOSE RANDOM: 90 mg/dL (ref 70–179)
PROTEIN TOTAL: 7.5 g/dL (ref 6.5–8.3)
SODIUM: 142 mmol/L (ref 135–145)

## 2018-12-06 LAB — CBC W/ AUTO DIFF
BASOPHILS ABSOLUTE COUNT: 0 10*9/L (ref 0.0–0.1)
BASOPHILS RELATIVE PERCENT: 0.7 %
EOSINOPHILS ABSOLUTE COUNT: 0.1 10*9/L (ref 0.0–0.4)
EOSINOPHILS RELATIVE PERCENT: 2.7 %
HEMATOCRIT: 40.3 % (ref 36.0–46.0)
HEMOGLOBIN: 13.4 g/dL (ref 12.0–16.0)
LARGE UNSTAINED CELLS: 4 % (ref 0–4)
LYMPHOCYTES ABSOLUTE COUNT: 1.2 10*9/L — ABNORMAL LOW (ref 1.5–5.0)
MEAN CORPUSCULAR HEMOGLOBIN CONC: 33.4 g/dL (ref 31.0–37.0)
MEAN CORPUSCULAR HEMOGLOBIN: 33.2 pg (ref 26.0–34.0)
MEAN CORPUSCULAR VOLUME: 99.5 fL (ref 80.0–100.0)
MEAN PLATELET VOLUME: 8.1 fL (ref 7.0–10.0)
MONOCYTES ABSOLUTE COUNT: 0.3 10*9/L (ref 0.2–0.8)
MONOCYTES RELATIVE PERCENT: 6.6 %
NEUTROPHILS ABSOLUTE COUNT: 2.9 10*9/L (ref 2.0–7.5)
NEUTROPHILS RELATIVE PERCENT: 61.3 %
RED BLOOD CELL COUNT: 4.05 10*12/L (ref 4.00–5.20)
RED CELL DISTRIBUTION WIDTH: 14.6 % (ref 12.0–15.0)

## 2018-12-06 LAB — EOSINOPHILS RELATIVE PERCENT: Eosinophils/100 leukocytes:NFr:Pt:Bld:Qn:Automated count: 2.7

## 2018-12-06 NOTE — Unmapped (Signed)
BMT Clinic Follow-up    Patient Name: Christine [name]  MRN: [id number]  Encounter date: 12/06/2018    Primary Care Provider: Allegra Grana, NP  BMT Attending MD:  Lanae Boast, MD.   ??  HPI: Michole Lecuyer Maffett is a 63 y.o. 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 2 yr 9 mo after transplant.  Tacrolimus stop date 06/15/16.    ??  Interval History:  Ms. Ryant presents today with her husband. She is feeling well today. Denies interim infections. Denies fever, chills, n/v/d. She if off all medications. She continues to be able to perform all activities without any limitations.    Oncology History Overview Note   Referring/Local Oncologist: none    Diagnosis: de novo AML with mutated FLT3    Genetics:   Karyotype/FISH: 47XX +11     Molecular Genetics: FLT3 ITD present; IDH2: c.419G>A [p.Arg140Gln]  ??  No variants detected in ASXL1, BCOR, CEBPA, DNMT3A, ETV6/TEL, EZH2, FLT3, IDH1, KIT, NPM1, NRAS, RUNX1, SF3B1, SRSF2, STAG2, TET2, TP53, U2AF1, WT1, ZRSR2    Pertinent Phenotypic data: blasts expressed CD4, CD11b, CD11c, CD33, CD34, CD64, CD117, and HLA-DR and lack expression of other informative analyzed markers including B and T-cell antigens.     Disease-specific prognostic estimation: ELN Adverse (per FLT3 allelic ratio high per estimate by molecular pathologist)       Acute myeloid leukemia (CMS-HCC)   08/29/2015 Initial Diagnosis    Acute myeloid leukemia (RAF-HCC)     09/02/2015 -  Chemotherapy    Induction therapy with daunorubicin 60 mg/m2 IV daily x 3, cytarabine 200 mg/m2/d CIV x 7 days, midostaurin 50 mg PO BID days 8-21     09/22/2015 Biopsy    50% cellular marrow with 50% blasts     09/25/2015 -  Chemotherapy    CLAG, cladribine 5 mg/m2/day days 1-5, cytarabine 2 grams/m2 IV days 1-5, GCSF days 0-5     10/23/2015 Remission    CR     11/03/2015 -  Chemotherapy    Cytarabine 1.5 g/m2 IV Q12 hrs days 1, 3, 5 11/04/2015 Adverse Reaction    Cardiomyopathy     11/28/2015 Adverse Reaction    Stenotrophomonas maltophilia CVAD infection     12/19/2015 -  Chemotherapy    Cytarabine 1.5 g/m2 IV Q12 hrs days 1, 3, 5     02/24/2016 Transplant    CTN 1301 (control arm) conditioning regimen with Busulfan and Fludarabine followed by matched sibling allo, received bone marrow product with cell dose of 3.18 x 10^6.        Patient Active Problem List   Diagnosis   ??? Acute deep vein thrombosis (DVT) of left lower extremity (CMS-HCC)   ??? Osteoporosis (RAF-HCC)   ??? Acute myeloid leukemia (CMS-HCC)   ??? Heart failure (CMS-HCC)   ??? 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 LLE distal DVT  Chronic PE on 09/10/15 CTA   ??? Allogeneic stem cell transplant (RAF-HCC)   ??? Hypomagnesemia   ??? Immunocompromised state (CMS-HCC)   ??? Pancytopenia (CMS-HCC)   ??? Immunization due   ??? Pneumonia of both lower lobes due to infectious organism   ??? 11/08/2017: Lap appendectomy fo Acute perforated appendicitis     Review of Systems:  A full system review was performed and was negative except as noted in the above interval history.    **Reviewed and updated past medical, surgical, social,  and family history as appropriate.      Allergies   Allergen Reactions   ??? Dapsone      Relative G6PD deficiency (lvl=5.1, lower limit of normal is 5.3)   ??? Penicillins Rash and Hives   ??? Rasburicase      Relative G6PD deficiency (lvl=5.1, lower limit of normal is 5.3)   ??? Sulfasalazine Hives   ??? Vancomycin Other (See Comments)     Avoid if possible, but if needed: Red man syndrome- slow rate over 2 hours, benadryl and ranitidine premed   ??? Adhesive Rash     Uses mepore     ??? Amoxicillin Rash   ??? Cefepime Rash     Can trial again while taking with antihistamine medications/antihistamine premeds    06/10/17: Has successfully tolerated cefepime with benadryl 25 PO as premed    ??? Morphine Rash     Can trial again while taking with antihistamine medications/antihistamine premeds (fentanyl may be better alternative if possible)   ??? Tapentadol Rash     Uses mepore     No current outpatient medications on file.     No current facility-administered medications for this visit.      Facility-Administered Medications Ordered in Other Visits   Medication Dose Route Frequency Provider Last Rate Last Dose   ??? flu vacc qs2019-20 6mos up(PF) (FLUARIX, FLULAVAL, FLUZONE) 60 mcg (15 mcg x 4)/0.5 mL injection            ??? varicella-zoster gE-AS01B (PF) (SHINGRIX) 50 mcg/0.5 mL injection              Physical Exam:  BP 118/85  - Pulse 75  - Temp 36.6 ??C (97.8 ??F) (Oral)  - Resp 16  - Ht 162.6 cm (5' 4.02)  - Wt 69.1 kg (152 lb 6.4 oz)  - LMP  (LMP Unknown)  - SpO2 99%  - BMI 26.15 kg/m??   General : Mid aged female, well appearing, in no acute distress.   Head: Normocephalic, atraumatic.     ENT: Anicteric, Moist mucous membranes. No lesions or erythema.  Cardiovascular: Pulse normal rate, regularity and rhythm. S1 and S2 normal.  Lungs: Clear to auscultation bilaterally, without wheezes/crackles/rhonchi. Good air movement.   Skin: Warm, dry, intact. No rash noted.   Psychiatry: Alert and oriented to person, place, and time.   Abdomen : Normoactive bowel sounds, abdomen soft, non-tender   Extremeties: No edema.   Musculo Skeletal: Full range of motion in shoulder, elbow, hip knee, ankle, left hand and feet.  Neurologic: CNII-XII grossly intact. Normal strength and sensation throughout    Test Results:   Lab Results   Component Value Date    WBC 3.8 (L) 09/06/2018    HGB 13.0 09/06/2018    HCT 40.4 09/06/2018    PLT 271 09/06/2018       Lab Results   Component Value Date    NA 140 09/06/2018    K 5.0 09/06/2018    CL 105 09/06/2018    CO2 25.0 09/06/2018    BUN 8 09/06/2018    CREATININE 0.74 09/06/2018    GLU 94 09/06/2018    CALCIUM 9.8 09/06/2018    MG 2.1 11/24/2017    PHOS 4.0 11/24/2017       Lab Results   Component Value Date    BILITOT 0.7 09/06/2018    BILIDIR <0.10 04/15/2016    PROT 7.2 09/06/2018    ALBUMIN 4.5 09/06/2018    ALT 21 09/06/2018  AST 36 09/06/2018    ALKPHOS 102 09/06/2018    GGT 115 (H) 08/10/2016       Lab Results   Component Value Date    INR 1.06 11/24/2017    APTT 24.3 (L) 04/15/2016     Assessment/Plan:    **AML:??(FLT3-ITD, +11, and IDH2)??in CR prior to transplant.   06/22/16: BMBx:   - ??Normocellular bone marrow (40%) with TLH and 3% blasts.  - ??Routine cytogenetics: donor karyotype; FISH (trisomy 11) results are normal.  - ??Flow cytometric MRD results reveal no evidence of aberrant myeloid antigen expression or abnormal myeloblasts.  - ??DNA Chimerism see below.  - Considered??maintenance with FLT3 inhibitor??(sorafenib). Was reluctant to start around D+45-60 due to pancytopenia and changing chimerism. Decided against it. 10 months after transplant maintenance was discussed again - decided against it.     03/15/18: BMBx (2 years):  -  Normocellular bone marrow (40%) with TLH and 1% blasts.  - ??Routine cytogenetics: donor karyotype; FISH (trisomy 11) results are normal.  - ??Flow cytometric MRD results reveal no evidence of aberrant myeloid antigen expression or abnormal myeloblasts.  - ??DNA Chimerism see below.  ??  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 ??  Conditioning:??  ????????????????????????Busulfan PK calculated from test dose 80 mg/hr daily and Fludarabine 40 mg/m2 daily day -5 thru day -2.  ????????????????????????Day 0 -??02/24/16  Cell dose:??3.24 x10^8 TNC (Marrow)  Chimerisms/Marrow:  03/31/16: Day 30 marrow normocellular w/1% blasts. FISH and cytos are normal. MRD negative.   BM Chimerisms show 75% donor CD3 and >95% donor in unfractionated compartment.   04/22/16: PB Chimerisms show 71% donor CD3 and >95% donor unfractionated.   05/05/16: BM Chimerisms show 65% donor CD3 and >95% donor unfractionated.   05/18/16: PB Chimerisms show 74% donor CD3 and 95% donor unfractionated.   06/01/16: PB Chimerisms show 69% donor CD3 and >95% donor unfractionated.  06/22/16: PB Chimerisms show 80% donor CD3 and >95% donor unfractionated  06/22/16: BM Chimerisms show 79% donor CD3 and >95% donor unfractionated.   07/06/16: PB Chimerisms show 77% donor CD3 and >95% donor unfractionated.  07/13/16: Off tac x 4 weeks.??Chimerism check next visit.  If no full chimerism at 8-10 weeks after discontinuation of tac will consider DLI. We do not have any donor cells stored. If we need to recollect for DLI, will consider mobilizing the donor and collecting CD3+ cells for DLI as well as CD34+ cells for another transplant.   07/26/16: Off tac almost 6 weeks. PB Chimerisms show 74% donor in CD3 and >95% donor unfractionated.   08/10/16: Off tac 8 weeks. Repeat DNA shows slight increase in CD3 to 79%. Plan after discussion with Dr. Oswald Hillock to re-collect sister for more donor cells for DLI. However, today with??elevated LFTs, this is new, will need to follow these and make sure no GVHD prior to contacting sister for further donation. This was discussed with Pam today.   08/18/16: Off tac 9 weeks.??PB Chimerisms show??85% donor CD3 and >95% donor unfractionated.  08/24/16: Off tac 10 weeks. BM Chimerisms show 79% donor CD3 and >95% donor unfractionated.  09/07/16: Off tac 12 weeks. PB Chimerisms show 91% donor CD3 and >95% donor unfractionated.??  09/28/16: PB chimerism 94% donor CD3 and >95% donor unfractionated.  10/26/16: PB chimerism 90% donor CD3 and >95% donor unfractionated.??  11/23/16: PB chimerism 95% donor CD3 and >95% donor unfractionated.??  12/22/16: PB chimerism 94% donor CD3 and >95% donor unfractionated.??  01/18/17: PB chimerism 93% donor CD3 and >95% donor unfractionated.??  02/15/17: PB chimerism 95% donor CD3 and >95% donor unfractionated.??  03/15/17: PB chimerism 95% donor CD3 and >95% donor unfractionated.??  04/12/17: PB chimerism 95% donor CD3 and >95% donor unfractionated.??  05/17/17: PB chimerism 95% donor CD3 and >95% donor unfractionated.??  06/21/17: PB chimerism >95% donor CD3 and >95% donor unfractionated.??  08/17/17:  PB chimerism >95% donor CD3 and >95% donor unfractionated.??  09/21/17: PB chimerism >95% donor CD3 and >95% donor unfractionated.??  11/30/17: PB chimerism 95% donor CD3 and >95% donor unfractionated.??  12/20/17: PB chimerism 95% donor CD3 and >95% donor unfractionated.??  03/01/18: PB chimerism 95% donor CD3 and >95% donor unfractionated.??  07/04/18: PB chimerism 94% donor CD3 and >95% donor unfractionated.??  07/12/18: PB chimerism >95% donor CD3 and >95% donor unfractionated.??  09/06/18: PB chimerism >95% donor CD3 and >95% donor unfractionated.  12/06/18: Not drawn. CBC WNL.     GVHD Prophy:??  - Methotrexate on day +1, 3, 6, 11 (received all 4 doses)  - Tac tapered and eventually DC'd 06/15/16????  - ??Has had no e/o GVHD,developed new elevated LFTs noted at visit on 7/31. Liver US(08/18/16): 1.Patent hepatic vasculature with normal flow direction 2.Large simple hepatic cysts.3. Stone in gallbladder.   - 01/18/17: LFTs continue to improve.   - 12/06/18: LFT WNL  ??  Heme:??CBC stable.   Transfusion criteria: * Pre-med with tylenol and Zyrtec pre- blood or platelet transfusion.   - Hgb <8 and Plt <10K or bleeding.   - Granix if ANC <0.5.   - 07/04/18: ANC 1.9. Has been waxing and waning. Will repeat next week.   - 12/06/18: ANC 2.9 and ALC 1.2.   ??  Hx of DVT:  - Completed 6 mos or anticoagulation.     **ID:??Afeb. No evidence of active infection.     Hx of acute appendicitis and serositis, complicated by abscess. Fully recovered.     Prophylaxis:   - Continue Valtrex for Zoster prophy 500 mg qd. Discontinued after received two doses of Shingrix.   - Bactrim stopped due to pancytopenia. Allergic to dapsone. Continued Pentamidine until 07/12/17.   - Vaccines as below.  ??  Low CD4  - 06/30/16: Off IS. CD4 count 126.   - 09/07/16: CD4 count 137. ??  - 09/28/16: Received Pentamidine today. Plan to check CD4 level at next visit and continue with monthly pentamidine.   - 10/26/16: CD4 count 180. Continue monthly pentamidine.   - 11/23/16: CD4 count 167. Continue monthly pentamidine.   - 12/22/16: Continue monthly pentamidine (today).  - 01/19/16: Pentamidine due today.    - 02/15/17: Repeat CD4 count 188. Continue pentamidine.   - 03/15/17: Received dose today.   - 04/12/17: Received dose today.   - 05/17/17: CD4 count 216. Received pentamidine today. Will discuss restarting Bactrim DS on Sat and Sun.  - 06/21/17: Pentamidine today. After recovery from cold will discuss restarting Bactrim DS on Sat and Sun.  - 07/12/17: Will DC pentamidine.   - 03/01/18: CD4 count 334.   ??  Viral studies:  - 11/23/16: Viral panels have been negative. Will stop monitoring unless clinically indicated.    - 03/15/17: [facility] Respiratory virus panel positive for rhinovirus.     - 06/08/17: Parainfluenza pneumonia. IgG on 5/30 was 630.   - 07/12/17: Fully recovered.   ??  Vaccine:   - 09/07/16: First set of post-transplant vaccines. Next set due in the end of February - next  visit   Will also consider Shingrix - likely after her immunity is a bit better.    - 03/15/17: Delay vaccines due to cold symptoms. Will plan on the next dose of post-transplant vaccines and Shingrix next visit.   - 04/12/17: Today 12 months post transplant vaccines. First dose of Shingrix vaccine. Next set in 10/2017.   - 12/20/17:  Shingrix given today as well as flu shot.   - 02/01/18: Valtrex completed last week. S/P Shingrix X 2. No further Valtrex needed.   - 03/01/18: Completed post transplant vaccines today.  - 12/06/18: Received a Flu shot last month.     **CV:   Hx of anthracycline induced cardiomyopathy:  - Nov 2017 w/EF 35%. This has now resolved with 01/2016 Echo showing estimated EF of 50%.   - Treated with lisinopril and metoprolol (Metoprolol 50mg  XL).   - Now off all antihypertensives due to intolerance (hypotension).   - 03/15/17: BP WNL. Asymptomatic.   - 04/12/17: Patient reported that her PCP checked her cholesterol and it was borderline high 239. She is wondering if she should take a lipid lowering agents recommended by her PCP. I advised her to discuss this with Dr. Barbette Merino whom she is scheduled to see in a few months.   ??  HTN:??Lisinopril stopped d/t AKI.   - 04/07/16: D/C'd Norvasc. Due to asymptomatic hypotension. Pre-transplant blood pressures run in the 100/70-80 range per her report.   Per Dr. Barbette Merino, ok to stop metoprolol given low BPs with plan to repeat Echo in 6-8 weeks.  - 07/06/16: Baseline ECHO today showed EF of 45-50%. DC metoprolol.   - 07/13/16: Follow BP. Will repeat ECHO 8-12 weeks after stopping metoprolol. Repeat ECHO scheduled for 10/26/2016.  - 10/26/16: Repeat ECHO showed normal EF. Ms. Halley saw Ram[facility] Marrow who decided not to restart ACE inhibitor or beta blocker. Recommended annual follow up with ECHO of the heart.    -03/01/18: Scheduled with Dr. Barbette Merino-  delayed due to COVID19.   - 07/04/18: Needs to reschedule follow up with Ram[facility] Marrow.   - 12/06/18: Rinaldo Cloud saw Ramond Marrow last week.     **GI/Hepatic:??  - Completed VOD prophylaxis.   - Elevated LFTs:??Mild elevation at visit on 07/26/16. 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   ---??08/10/16 AST and ALT now 107/170 respectively. Normal amylase and lipase. No abdominal pain or GI symptoms.   ---??08/13/16 ????AST 191 and ALT 283. Alk phos 177.  ---??08/18/16 ??AST 144 and ALT 264. Alk phos 218. Liver US: with patent hepatic vasculature notable for large simple hepatic cyst and gallstones.  ---??09/07/16: AST 136 and ALT 242. Alk phos 197. Stable.   ---??09/28/16: AST 91 and ALT 159. Alk phos 128. LFTs continue to improve.   --- 10/26/16: AST 84 and ALT 139. Alk phos 114. LFTs continue to improve.   --- 11/23/16:  AST 64 and ALT 102. Alk phos 96. LFTs continue to improve.   --- 12/22/16:  AST 51 and ALT 72. Alk phos 79. LFTs continue to improve.   --- 01/18/17: AST 42 and ALT 53. Alk phos 75. LFTs continue to improve.    --- 02/15/17: AST 47 and ALT 53. Alk phos 71. Stable.   --- 06/21/17 - present: LFT WNL.   ??  **Bone Health:  - 03/31/16: DEXA scan showed osteoporosis of the spine and osteopenia of the femoral neck. Zometa and Ca/Vit D.   - 04/02/16: Received 1st dose of Zometa.   - 07/06/16. 2nd dose of Zometa. Completed. Continue Ca/Vit  D supplement.   - 03/15/17: Patient received two doses of Zometa. Continue Ca/Vit D supplement. Vit D 38.9 WNL.   - 07/04/18: Will schedule Dexa scan and Zometa. Will coordinate with next visit.  - 09/06/18: Dexa scan indicative of low bone density.   - 12/06/18: Scheduled for Zometa.   ??  Plan:   - 2 yr 9 mo. Clinically doing well. CBC stable. Continue to follow counts and chimerism every 2-3 months.   - PFTs on 03/01/18: continue to improve.    - Patient has osteoporosis with diagnosis preceding allo-SCT. Repeat dexa scan indicative of low bone density - scheduled for Zometa.    - Follow up with Ramond Marrow for cardiomyopathy.       Willaim Bane, MD  [facility]

## 2018-12-06 NOTE — Unmapped (Signed)
Pt arrived to infusion in NAD. Denies any issues. Labs reviewed. PIV flushes well, pt denies any pain at IV site. Pre med given. Pt tolerated reclast infusion well. VSS. After infusion complete, infiltration noted at IV site. Pt states the area is burning. RN was not notified at onset of burning. PIV removed. Tiney Rouge, NP notified. NP assessed pt. Warm compress and elevation recommended to pt by NP. Ok to discharge pt per NP. Pt left infusion in NAD.

## 2018-12-06 NOTE — Unmapped (Signed)
Contacted patient post discharge and instructed her to call inpatient BMT over holiday if any redness or pain or discoloration to arm occurs post extravasation of reclast in infusion today. Verbalized understanding. She will use intermittent ice for approximately 6 hours and heat if needed tomorrow for swelling.  Per Dr Oswald Hillock patient may need antibiotic if infectious symptoms occur and patient understands to call if any occur.

## 2018-12-07 NOTE — Unmapped (Signed)
Encounter addended by: Rufina Falco, ANP on: 12/06/2018 8:28 PM   Actions taken: Level of Service modified, Clinical Note Signed

## 2018-12-07 NOTE — Unmapped (Signed)
Christine [name]  [id number]  12/06/2018    Oncologic diagnosis:   AML   CC: swollen arm    HPI: Reclast infused per provider orders.  During the infusion patient states that the infusion was not comfortable. She did not mention it during the infusion.  After the infusion was done she said her arm felt swollen and it was really uncomfortable with the flush.  She recalls that during the blood draw after the line was placed; there was trouble getting blood return so the other arm had to be used for labs.  The IV was left in place.    ROS: as per HPI    No current outpatient medications on file.     No current facility-administered medications for this encounter.      Facility-Administered Medications Ordered in Other Encounters   Medication Dose Route Frequency Provider Last Rate Last Dose   ??? flu vacc qs2019-20 6mos up(PF) (FLUARIX, FLULAVAL, FLUZONE) 60 mcg (15 mcg x 4)/0.5 mL injection            ??? varicella-zoster gE-AS01B (PF) (SHINGRIX) 50 mcg/0.5 mL injection              Allergies   Allergen Reactions   ??? Dapsone      Relative G6PD deficiency (lvl=5.1, lower limit of normal is 5.3)   ??? Penicillins Rash and Hives   ??? Rasburicase      Relative G6PD deficiency (lvl=5.1, lower limit of normal is 5.3)   ??? Sulfasalazine Hives   ??? Vancomycin Other (See Comments)     Avoid if possible, but if needed: Red man syndrome- slow rate over 2 hours, benadryl and ranitidine premed   ??? Adhesive Rash     Uses mepore     ??? Amoxicillin Rash   ??? Cefepime Rash     Can trial again while taking with antihistamine medications/antihistamine premeds    06/10/17: Has successfully tolerated cefepime with benadryl 25 PO as premed    ??? Morphine Rash     Can trial again while taking with antihistamine medications/antihistamine premeds (fentanyl may be better alternative if possible)   ??? Tapentadol Rash     Uses mepore       Lab Results   Component Value Date    WBC 4.6 12/06/2018    HGB 13.4 12/06/2018    HCT 40.3 12/06/2018    PLT 254 12/06/2018       Lab Results   Component Value Date    NA 142 12/06/2018    K 4.9 12/06/2018    CL 105 12/06/2018    CO2 26.0 12/06/2018    BUN 13 12/06/2018    CREATININE 0.76 12/06/2018    GLU 90 12/06/2018    CALCIUM 10.0 12/06/2018    MG 2.1 11/24/2017    PHOS 4.0 11/24/2017       Lab Results   Component Value Date    BILITOT 0.5 12/06/2018    BILIDIR <0.10 04/15/2016    PROT 7.5 12/06/2018    ALBUMIN 4.7 12/06/2018    ALT 18 12/06/2018    AST 23 12/06/2018    ALKPHOS 96 12/06/2018    GGT 115 (H) 08/10/2016       Lab Results   Component Value Date    INR 1.06 11/24/2017    APTT 24.3 (L) 04/15/2016       Physical Exam:    General appearance: alert and no distress   Extremities: L>R in size. Left arm  measures 31 cm and right arm measures 29 cm to the soft tissue above the bend of the elbow.  Left arm is tight and feels cool to the touch.  No redness. Non tender to the touch.              Assessment and plan:  1) elevate arm  2) apply heat PRN  3) can use ice if painful  4) call if sx worsen or progress.  5) will redose in Feb if needed. We may not need to redose I will have pharmacy follow up on this.  This medication is not a vesicant or an irritant.      Disposition:  Return to clinic at previously scheduled interval.          Westley Hummer ANP-BC, AOCNP  Bone Marrow Transplant Nurse Practitioner

## 2018-12-12 DIAGNOSIS — H0288A Meibomian gland dysfunction right eye, upper and lower eyelids: Secondary | ICD-10-CM | POA: Diagnosis not present

## 2018-12-12 DIAGNOSIS — H02055 Trichiasis without entropian left lower eyelid: Secondary | ICD-10-CM | POA: Diagnosis not present

## 2018-12-12 DIAGNOSIS — H0288B Meibomian gland dysfunction left eye, upper and lower eyelids: Secondary | ICD-10-CM | POA: Diagnosis not present

## 2018-12-25 DIAGNOSIS — L570 Actinic keratosis: Secondary | ICD-10-CM | POA: Diagnosis not present

## 2018-12-25 DIAGNOSIS — L578 Other skin changes due to chronic exposure to nonionizing radiation: Secondary | ICD-10-CM | POA: Diagnosis not present

## 2018-12-25 DIAGNOSIS — C44519 Basal cell carcinoma of skin of other part of trunk: Secondary | ICD-10-CM | POA: Diagnosis not present

## 2018-12-25 DIAGNOSIS — L84 Corns and callosities: Secondary | ICD-10-CM | POA: Diagnosis not present

## 2018-12-25 DIAGNOSIS — C44612 Basal cell carcinoma of skin of right upper limb, including shoulder: Secondary | ICD-10-CM | POA: Diagnosis not present

## 2018-12-25 DIAGNOSIS — L821 Other seborrheic keratosis: Secondary | ICD-10-CM | POA: Diagnosis not present

## 2019-02-02 ENCOUNTER — Ambulatory Visit: Payer: PPO | Attending: Internal Medicine

## 2019-02-02 DIAGNOSIS — Z23 Encounter for immunization: Secondary | ICD-10-CM

## 2019-02-02 NOTE — Progress Notes (Signed)
   Covid-19 Vaccination Clinic  Name:  Robyn Montgomery    MRN: [id number] DOB: December 25, 1955  02/02/2019  [name] was observed post Covid-19 immunization for 15 minutes without incidence. She was provided with Vaccine Information Sheet and instruction to access the V-Safe system.   [name] was instructed to call 911 with any severe reactions post vaccine: [name] Kitchen Difficulty breathing  . Swelling of your face and throat  . A fast heartbeat  . A bad rash all over your body  . Dizziness and weakness    Immunizations Administered    Name Date Dose VIS Date Route   Pfizer COVID-19 Vaccine 02/02/2019  2:32 PM 0.3 mL 12/22/2018 Intramuscular   Manufacturer: [place]   Lot: [id number]   [place]: [id number]

## 2019-02-06 DIAGNOSIS — H02055 Trichiasis without entropian left lower eyelid: Secondary | ICD-10-CM | POA: Diagnosis not present

## 2019-02-06 DIAGNOSIS — Z20822 Contact with and (suspected) exposure to covid-19: Secondary | ICD-10-CM | POA: Diagnosis not present

## 2019-02-09 DIAGNOSIS — H02052 Trichiasis without entropian right lower eyelid: Secondary | ICD-10-CM | POA: Diagnosis not present

## 2019-02-22 ENCOUNTER — Ambulatory Visit: Payer: PPO | Attending: Internal Medicine

## 2019-02-22 DIAGNOSIS — Z23 Encounter for immunization: Secondary | ICD-10-CM | POA: Insufficient documentation

## 2019-02-22 NOTE — Progress Notes (Signed)
   Covid-19 Vaccination Clinic  Name:  Robyn Montgomery    MRN: [id number] DOB: [birth year]  02/22/2019  [name] was observed post Covid-19 immunization for 15 minutes without incidence. She was provided with Vaccine Information Sheet and instruction to access the V-Safe system.   [name] was instructed to call 911 with any severe reactions post vaccine: [name] Kitchen Difficulty breathing  . Swelling of your face and throat  . A fast heartbeat  . A bad rash all over your body  . Dizziness and weakness    Immunizations Administered    Name Date Dose VIS Date Route   Pfizer COVID-19 Vaccine 02/22/2019 10:28 AM 0.3 mL 12/22/2018 Intramuscular   Manufacturer: [place]   Lot: [id number]   [place]: [id number]

## 2019-02-23 ENCOUNTER — Telehealth: Payer: Self-pay | Admitting: Family

## 2019-02-23 DIAGNOSIS — Z0000 Encounter for general adult medical examination without abnormal findings: Secondary | ICD-10-CM

## 2019-02-23 NOTE — Telephone Encounter (Signed)
Ordered  May schedule

## 2019-02-23 NOTE — Telephone Encounter (Signed)
I have patient scheduled for fasting labs.

## 2019-02-23 NOTE — Telephone Encounter (Signed)
Left message for patient to call back and schedule Medicare Annual Wellness Visit (AWV) either virtually or audio only.  No hx of AWV; please schedule at anytime with Denisa O'Brien-Blaney at [facility]  Pt started Part B Medicare 2.1.2020

## 2019-02-23 NOTE — Telephone Encounter (Signed)
If you order labs I will have patient scheduled.

## 2019-02-23 NOTE — Telephone Encounter (Signed)
Patient has a physical in July and would like to do labs before appt. No orders in chart, does patient need labs.

## 2019-03-06 ENCOUNTER — Ambulatory Visit: Admit: 2019-03-06 | Discharge: 2019-03-06 | Payer: PRIVATE HEALTH INSURANCE

## 2019-03-06 ENCOUNTER — Other Ambulatory Visit: Admit: 2019-03-06 | Discharge: 2019-03-06 | Payer: PRIVATE HEALTH INSURANCE

## 2019-03-06 DIAGNOSIS — Z9481 Bone marrow transplant status: Principal | ICD-10-CM

## 2019-03-06 DIAGNOSIS — D899 Disorder involving the immune mechanism, unspecified: Principal | ICD-10-CM

## 2019-03-06 DIAGNOSIS — C9201 Acute myeloblastic leukemia, in remission: Principal | ICD-10-CM

## 2019-03-06 DIAGNOSIS — Z9484 Stem cells transplant status: Principal | ICD-10-CM

## 2019-03-06 DIAGNOSIS — Z79899 Other long term (current) drug therapy: Secondary | ICD-10-CM | POA: Diagnosis not present

## 2019-03-06 DIAGNOSIS — Z9221 Personal history of antineoplastic chemotherapy: Secondary | ICD-10-CM | POA: Diagnosis not present

## 2019-03-06 DIAGNOSIS — Z880 Allergy status to penicillin: Secondary | ICD-10-CM | POA: Diagnosis not present

## 2019-03-06 DIAGNOSIS — M810 Age-related osteoporosis without current pathological fracture: Secondary | ICD-10-CM | POA: Diagnosis not present

## 2019-03-06 DIAGNOSIS — D61818 Other pancytopenia: Secondary | ICD-10-CM | POA: Diagnosis not present

## 2019-03-06 DIAGNOSIS — D849 Immunodeficiency, unspecified: Secondary | ICD-10-CM | POA: Diagnosis not present

## 2019-03-06 LAB — CBC W/ AUTO DIFF
BASOPHILS ABSOLUTE COUNT: 0 10*9/L (ref 0.0–0.1)
BASOPHILS RELATIVE PERCENT: 1.1 %
EOSINOPHILS ABSOLUTE COUNT: 0.1 10*9/L (ref 0.0–0.4)
EOSINOPHILS RELATIVE PERCENT: 3.2 %
HEMATOCRIT: 42.3 % (ref 36.0–46.0)
LARGE UNSTAINED CELLS: 2 % (ref 0–4)
LYMPHOCYTES RELATIVE PERCENT: 28.6 %
MEAN CORPUSCULAR HEMOGLOBIN CONC: 32.6 g/dL (ref 31.0–37.0)
MEAN CORPUSCULAR HEMOGLOBIN: 32.3 pg (ref 26.0–34.0)
MEAN CORPUSCULAR VOLUME: 98.9 fL (ref 80.0–100.0)
MEAN PLATELET VOLUME: 8.5 fL (ref 7.0–10.0)
MONOCYTES ABSOLUTE COUNT: 0.3 10*9/L (ref 0.2–0.8)
NEUTROPHILS ABSOLUTE COUNT: 2.1 10*9/L (ref 2.0–7.5)
NEUTROPHILS RELATIVE PERCENT: 58 %
PLATELET COUNT: 269 10*9/L (ref 150–440)
RED BLOOD CELL COUNT: 4.27 10*12/L (ref 4.00–5.20)
RED CELL DISTRIBUTION WIDTH: 13.7 % (ref 12.0–15.0)
WBC ADJUSTED: 3.6 10*9/L — ABNORMAL LOW (ref 4.5–11.0)

## 2019-03-06 LAB — COMPREHENSIVE METABOLIC PANEL
ALKALINE PHOSPHATASE: 79 U/L (ref 38–126)
ALT (SGPT): 16 U/L (ref <35)
ANION GAP: 11 mmol/L (ref 7–15)
AST (SGOT): 25 U/L (ref 14–38)
BILIRUBIN TOTAL: 0.5 mg/dL (ref 0.0–1.2)
BLOOD UREA NITROGEN: 10 mg/dL (ref 7–21)
BUN / CREAT RATIO: 13
CALCIUM: 10.1 mg/dL (ref 8.5–10.2)
CHLORIDE: 103 mmol/L (ref 98–107)
CO2: 31 mmol/L — ABNORMAL HIGH (ref 22.0–30.0)
CREATININE: 0.78 mg/dL (ref 0.60–1.00)
EGFR CKD-EPI AA FEMALE: >90 mL/min/{1.73_m2} (ref >=60)
EGFR CKD-EPI NON-AA FEMALE: 80 mL/min/{1.73_m2} (ref >=60)
GLUCOSE RANDOM: 98 mg/dL (ref 70–179)
POTASSIUM: 4.5 mmol/L (ref 3.5–5.0)
PROTEIN TOTAL: 7.5 g/dL (ref 6.5–8.3)
SODIUM: 145 mmol/L (ref 135–145)

## 2019-03-06 LAB — ALKALINE PHOSPHATASE: Alkaline phosphatase:CCnc:Pt:Ser/Plas:Qn:: 79

## 2019-03-06 LAB — LYMPHOCYTES RELATIVE PERCENT: Lymphocytes/100 leukocytes:NFr:Pt:Bld:Qn:Automated count: 28.6

## 2019-03-06 MED ORDER — CALCIUM CARBONATE-VITAMIN D3 600 MG (1,500 MG)-800 UNIT TABLET
ORAL_TABLET | Freq: Two times a day (BID) | ORAL | 5 refills | 0.00000 days | Status: CP
Start: 2019-03-06 — End: ?
  Filled 2019-03-06: qty 60, 30d supply, fill #0

## 2019-03-06 MED FILL — CALCIUM CARBONATE-VITAMIN D3 600 MG (1,500 MG)-800 UNIT TABLET: 30 days supply | Qty: 60 | Fill #0 | Status: AC

## 2019-03-06 NOTE — Unmapped (Signed)
Discharged from infusion area ambulatory with no complications noted post reclast.

## 2019-03-06 NOTE — Unmapped (Signed)
BMT Clinic Follow-up    Patient Name: Christine [name]  MRN: [id number]  Encounter date: 03/06/2019    Primary Care Provider: Allegra Grana, NP  BMT Attending MD:  Lanae Boast, MD.   ??  HPI: Christine [name] is a 64 y.o. 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 3 yr mo after transplant. Tacrolimus stop date 06/15/16.    ??  Interval History:  Christine [name] presents today with her husband. She is feeling well today. Denies interim infections. Denies fever, chills, n/v/d. She if off all medications. She continues to be able to perform all activities without any limitations.  Three weeks ago received the second dose of vaccine: both she and her husband received this.     Oncology History Overview Note   Referring/Local Oncologist: none    Diagnosis: de novo AML with mutated FLT3    Genetics:   Karyotype/FISH: 47XX +11     Molecular Genetics: FLT3 ITD present; IDH2: c.419G>A [p.Arg140Gln]  ??  No variants detected in ASXL1, BCOR, CEBPA, DNMT3A, ETV6/TEL, EZH2, FLT3, IDH1, KIT, NPM1, NRAS, RUNX1, SF3B1, SRSF2, STAG2, TET2, TP53, U2AF1, WT1, ZRSR2    Pertinent Phenotypic data: blasts expressed CD4, CD11b, CD11c, CD33, CD34, CD64, CD117, and HLA-DR and lack expression of other informative analyzed markers including B and T-cell antigens.     Disease-specific prognostic estimation: ELN Adverse (per FLT3 allelic ratio high per estimate by molecular pathologist)       Acute myeloid leukemia (CMS-HCC)   08/29/2015 Initial Diagnosis    Acute myeloid leukemia (RAF-HCC)     09/02/2015 -  Chemotherapy    Induction therapy with daunorubicin 60 mg/m2 IV daily x 3, cytarabine 200 mg/m2/d CIV x 7 days, midostaurin 50 mg PO BID days 8-21     09/22/2015 Biopsy    50% cellular marrow with 50% blasts     09/25/2015 -  Chemotherapy    CLAG, cladribine 5 mg/m2/day days 1-5, cytarabine 2 grams/m2 IV days 1-5, GCSF days 0-5     10/23/2015 Remission    CR     11/03/2015 -  Chemotherapy    Cytarabine 1.5 g/m2 IV Q12 hrs days 1, 3, 5     11/04/2015 Adverse Reaction    Cardiomyopathy     11/28/2015 Adverse Reaction    Stenotrophomonas maltophilia CVAD infection     12/19/2015 -  Chemotherapy    Cytarabine 1.5 g/m2 IV Q12 hrs days 1, 3, 5     02/24/2016 Transplant    CTN 1301 (control arm) conditioning regimen with Busulfan and Fludarabine followed by matched sibling allo, received bone marrow product with cell dose of 3.18 x 10^6.        Patient Active Problem List   Diagnosis   ??? Acute deep vein thrombosis (DVT) of left lower extremity (CMS-HCC)   ??? Osteoporosis (RAF-HCC)   ??? Acute myeloid leukemia (CMS-HCC)   ??? Heart failure (CMS-HCC)   ??? 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 LLE distal DVT  Chronic PE on 09/10/15 CTA   ??? Allogeneic stem cell transplant (RAF-HCC)   ??? Hypomagnesemia   ??? Immunocompromised state (CMS-HCC)   ??? Pancytopenia (CMS-HCC)   ??? Immunization due   ??? Pneumonia of both lower lobes due to infectious organism   ??? 11/08/2017: Lap appendectomy fo Acute perforated appendicitis     Review of Systems:  A full system review was performed and  was negative except as noted in the above interval history.    **Reviewed and updated past medical, surgical, social, and family history as appropriate.      Allergies   Allergen Reactions   ??? Dapsone      Relative G6PD deficiency (lvl=5.1, lower limit of normal is 5.3)   ??? Penicillins Rash and Hives   ??? Rasburicase      Relative G6PD deficiency (lvl=5.1, lower limit of normal is 5.3)   ??? Sulfasalazine Hives   ??? Vancomycin Other (See Comments)     Avoid if possible, but if needed: Red man syndrome- slow rate over 2 hours, benadryl and ranitidine premed   ??? Adhesive Rash     Uses mepore     ??? Amoxicillin Rash   ??? Cefepime Rash     Can trial again while taking with antihistamine medications/antihistamine premeds    06/10/17: Has successfully tolerated cefepime with benadryl 25 PO as premed    ??? Morphine Rash     Can trial again while taking with antihistamine medications/antihistamine premeds (fentanyl may be better alternative if possible)   ??? Tapentadol Rash     Uses mepore     No current outpatient medications on file.     No current facility-administered medications for this visit.      Facility-Administered Medications Ordered in Other Visits   Medication Dose Route Frequency Provider Last Rate Last Admin   ??? flu vacc qs2019-20 6mos up(PF) (FLUARIX, FLULAVAL, FLUZONE) 60 mcg (15 mcg x 4)/0.5 mL injection            ??? varicella-zoster gE-AS01B (PF) (SHINGRIX) 50 mcg/0.5 mL injection              Physical Exam:  BP (S) 117/91 Comment: second reading on right arm 126/87 - Pulse 85  - Temp 37.1 ??C (98.8 ??F) (Oral)  - Resp 17  - Ht 162.6 cm (5' 4.02)  - Wt 68.5 kg (151 lb 1.6 oz)  - LMP  (LMP Unknown)  - SpO2 98%  - BMI 25.92 kg/m??   General : Mid aged female, well appearing, in no acute distress.   Head: Normocephalic, atraumatic.     ENT: Anicteric, Moist mucous membranes. No lesions or erythema.  Cardiovascular: Pulse normal rate, regularity and rhythm. S1 and S2 normal.  Lungs: Clear to auscultation bilaterally, without wheezes/crackles/rhonchi. Good air movement.   Skin: Warm, dry, intact. No rash noted.   Psychiatry: Alert and oriented to person, place, and time.   Abdomen : Normoactive bowel sounds, abdomen soft, non-tender   Extremeties: No edema.   Musculo Skeletal: Full range of motion in shoulder, elbow, hip knee, ankle, left hand and feet.  Neurologic: CNII-XII grossly intact. Normal strength and sensation throughout    Test Results:   Lab Results   Component Value Date    WBC 3.6 (L) 03/06/2019    HGB 13.8 03/06/2019    HCT 42.3 03/06/2019    PLT 269 03/06/2019       Lab Results   Component Value Date    NA 142 12/06/2018    K 4.9 12/06/2018    CL 105 12/06/2018    CO2 26.0 12/06/2018    BUN 13 12/06/2018    CREATININE 0.76 12/06/2018    GLU 90 12/06/2018    CALCIUM 10.0 12/06/2018    MG 2.1 11/24/2017    PHOS 4.0 11/24/2017       Lab Results   Component Value Date  BILITOT 0.5 12/06/2018    BILIDIR <0.10 04/15/2016    PROT 7.5 12/06/2018    ALBUMIN 4.7 12/06/2018    ALT 18 12/06/2018    AST 23 12/06/2018    ALKPHOS 96 12/06/2018    GGT 115 (H) 08/10/2016       Lab Results   Component Value Date    INR 1.06 11/24/2017    APTT 24.3 (L) 04/15/2016     Assessment/Plan:  **AML:??(FLT3-ITD, +11, and IDH2)??in CR prior to transplant.   06/22/16: BMBx:   - ??Normocellular bone marrow (40%) with TLH and 3% blasts.  - ??Routine cytogenetics: donor karyotype; FISH (trisomy 11) results are normal.  - ??Flow cytometric MRD results reveal no evidence of aberrant myeloid antigen expression or abnormal myeloblasts.  - ??DNA Chimerism see below.  - Considered??maintenance with FLT3 inhibitor??(sorafenib). Was reluctant to start around D+45-60 due to pancytopenia and changing chimerism. Decided against it. 10 months after transplant maintenance was discussed again - decided against it.     03/15/18: BMBx (2 years):  -  Normocellular bone marrow (40%) with TLH and 1% blasts.  - ??Routine cytogenetics: donor karyotype; FISH (trisomy 11) results are normal.  - ??Flow cytometric MRD results reveal no evidence of aberrant myeloid antigen expression or abnormal myeloblasts.  - ??DNA Chimerism see below.   ??  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 ??  Conditioning:??  ????????????????????????Busulfan PK calculated from test dose 80 mg/hr daily and Fludarabine 40 mg/m2 daily day -5 thru day -2.  ????????????????????????Day 0 -??02/24/16  Cell dose:??3.24 x10^8 TNC (Marrow)  Chimerisms/Marrow:  03/31/16: Day 30 marrow normocellular w/1% blasts. FISH and cytos are normal. MRD negative.   BM Chimerisms show 75% donor CD3 and >95% donor in unfractionated compartment.   04/22/16: PB Chimerisms show 71% donor CD3 and >95% donor unfractionated.   05/05/16: BM Chimerisms show 65% donor CD3 and >95% donor unfractionated.   05/18/16: PB Chimerisms show 74% donor CD3 and 95% donor unfractionated.   06/01/16: PB Chimerisms show 69% donor CD3 and >95% donor unfractionated.  06/22/16: PB Chimerisms show 80% donor CD3 and >95% donor unfractionated  06/22/16: BM Chimerisms show 79% donor CD3 and >95% donor unfractionated.   07/06/16: PB Chimerisms show 77% donor CD3 and >95% donor unfractionated.  07/13/16: Off tac x 4 weeks.??Chimerism check next visit.  If no full chimerism at 8-10 weeks after discontinuation of tac will consider DLI. We do not have any donor cells stored. If we need to recollect for DLI, will consider mobilizing the donor and collecting CD3+ cells for DLI as well as CD34+ cells for another transplant.   07/26/16: Off tac almost 6 weeks. PB Chimerisms show 74% donor in CD3 and >95% donor unfractionated.   08/10/16: Off tac 8 weeks. Repeat DNA shows slight increase in CD3 to 79%. Plan after discussion with Dr. Oswald Hillock to re-collect sister for more donor cells for DLI. However, today with??elevated LFTs, this is new, will need to follow these and make sure no GVHD prior to contacting sister for further donation. This was discussed with Pam today.   08/18/16: Off tac 9 weeks.??PB Chimerisms show??85% donor CD3 and >95% donor unfractionated.  08/24/16: Off tac 10 weeks. BM Chimerisms show 79% donor CD3 and >95% donor unfractionated.  09/07/16: Off tac 12 weeks. PB Chimerisms show 91% donor CD3 and >95% donor unfractionated.??  09/28/16: PB chimerism 94% donor CD3 and >95% donor unfractionated.  10/26/16: PB chimerism  90% donor CD3 and >95% donor unfractionated.??  11/23/16: PB chimerism 95% donor CD3 and >95% donor unfractionated.??  12/22/16: PB chimerism 94% donor CD3 and >95% donor unfractionated.??  01/18/17: PB chimerism 93% donor CD3 and >95% donor unfractionated.??  02/15/17: PB chimerism 95% donor CD3 and >95% donor unfractionated.??  03/15/17: PB chimerism 95% donor CD3 and >95% donor unfractionated.??  04/12/17: PB chimerism 95% donor CD3 and >95% donor unfractionated.??  05/17/17: PB chimerism 95% donor CD3 and >95% donor unfractionated.??  06/21/17: PB chimerism >95% donor CD3 and >95% donor unfractionated.??  08/17/17:  PB chimerism >95% donor CD3 and >95% donor unfractionated.??  09/21/17: PB chimerism >95% donor CD3 and >95% donor unfractionated.??  11/30/17: PB chimerism 95% donor CD3 and >95% donor unfractionated.??  12/20/17: PB chimerism 95% donor CD3 and >95% donor unfractionated.??  03/01/18: PB chimerism 95% donor CD3 and >95% donor unfractionated.??  07/04/18: PB chimerism 94% donor CD3 and >95% donor unfractionated.??  07/12/18: PB chimerism >95% donor CD3 and >95% donor unfractionated.??  09/06/18: PB chimerism >95% donor CD3 and >95% donor unfractionated.  12/06/18: Not drawn. CBC WNL.   03/06/19: PB chimerism >95% donor CD3 and >95% donor unfractionated.    GVHD Prophy:??  - Methotrexate on day +1, 3, 6, 11 (received all 4 doses)  - Tac tapered and eventually DC'd 06/15/16????  - ??Has had no e/o GVHD,developed new elevated LFTs noted at visit on 7/31. Liver US(08/18/16): 1.Patent hepatic vasculature with normal flow direction 2.Large simple hepatic cysts.3. Stone in gallbladder.   - 01/18/17: LFTs continue to improve.   - 03/06/19: LFT WNL  ??  Heme:??CBC stable.   Transfusion criteria: * Pre-med with tylenol and Zyrtec pre- blood or platelet transfusion.   - Hgb <8 and Plt <10K or bleeding.   - Granix if ANC <0.5.   - 07/04/18: ANC 1.9. Has been waxing and waning. Will repeat next week.   - 12/06/18: ANC 2.9 and ALC 1.2.  - 03/06/19: ANC 2.1 and ALC 1.0.    ??  Hx of DVT:  - Completed 6 mos or anticoagulation.     **ID:??Afeb. No evidence of active infection.     Hx of acute appendicitis and serositis, complicated by abscess. Fully recovered.     Prophylaxis:   - Continue Valtrex for Zoster prophy 500 mg qd. Discontinued after received two doses of Shingrix.   - Bactrim stopped due to pancytopenia. Allergic to dapsone. Continued Pentamidine until 07/12/17.   - Vaccines as below.  ??  Low CD4  - 06/30/16: Off IS. CD4 count 126.   - 09/07/16: CD4 count 137. ??  - 09/28/16: Received Pentamidine today. Plan to check CD4 level at next visit and continue with monthly pentamidine.   - 10/26/16: CD4 count 180. Continue monthly pentamidine.   - 11/23/16: CD4 count 167. Continue monthly pentamidine.   - 12/22/16: Continue monthly pentamidine (today).  - 01/19/16: Pentamidine due today.    - 02/15/17: Repeat CD4 count 188. Continue pentamidine.   - 03/15/17: Received dose today.   - 04/12/17: Received dose today.   - 05/17/17: CD4 count 216. Received pentamidine today. Will discuss restarting Bactrim DS on Sat and Sun.  - 06/21/17: Pentamidine today. After recovery from cold will discuss restarting Bactrim DS on Sat and Sun.  - 07/12/17: Will DC pentamidine.   - 03/01/18: CD4 count 334.   ??  Viral studies:  - 11/23/16: Viral panels have been negative. Will stop monitoring unless clinically indicated.    - 03/15/17:  [facility] Respiratory virus panel positive for rhinovirus.     - 06/08/17: Parainfluenza pneumonia. IgG on 5/30 was 630.   - 07/12/17: Fully recovered.   ??  Vaccine:   - 09/07/16: First set of post-transplant vaccines. Next set due in the end of February - next visit   Will also consider Shingrix - likely after her immunity is a bit better.    - 03/15/17: Delay vaccines due to cold symptoms. Will plan on the next dose of post-transplant vaccines and Shingrix next visit.   - 04/12/17: Today 12 months post transplant vaccines. First dose of Shingrix vaccine. Next set in 10/2017.   - 12/20/17:  Shingrix given today as well as flu shot.   - 02/01/18: Valtrex completed last week. S/P Shingrix X 2. No further Valtrex needed.   - 03/01/18: Completed post transplant vaccines today.  - 12/06/18: Received a Flu shot last month.   - 03/06/19: Received two doses of COVID-19 vaccine.     **CV:   Hx of anthracycline induced cardiomyopathy:  - Nov 2017 w/EF 35%. This has now resolved with 01/2016 Echo showing estimated EF of 50%.   - Treated with lisinopril and metoprolol (Metoprolol 50mg  XL).   - Now off all antihypertensives due to intolerance (hypotension).   - 03/15/17: BP WNL. Asymptomatic.   - 04/12/17: Patient reported that her PCP checked her cholesterol and it was borderline high 239. She is wondering if she should take a lipid lowering agents recommended by her PCP. I advised her to discuss this with Dr. Barbette Merino whom she is scheduled to see in a few months.   ??  HTN:??Lisinopril stopped d/t AKI.   - 04/07/16: D/C'd Norvasc. Due to asymptomatic hypotension. Pre-transplant blood pressures run in the 100/70-80 range per her report.   Per Dr. Barbette Merino, ok to stop metoprolol given low BPs with plan to repeat Echo in 6-8 weeks.  - 07/06/16: Baseline ECHO today showed EF of 45-50%. DC metoprolol.   - 07/13/16: Follow BP. Will repeat ECHO 8-12 weeks after stopping metoprolol. Repeat ECHO scheduled for 10/26/2016.  - 10/26/16: Repeat ECHO showed normal EF. Ms. Coaxum saw Ramond Marrow who decided not to restart ACE inhibitor or beta blocker. Recommended annual follow up with ECHO of the heart.    -03/01/18: Scheduled with Dr. Barbette Merino-  delayed due to COVID19.   - 07/04/18: Needs to reschedule follow up with Ramond Marrow.   - 12/06/18: Rinaldo Cloud saw Ramond Marrow last week.     **GI/Hepatic:??  - Completed VOD prophylaxis.   - Elevated LFTs:??Mild elevation at visit on 07/26/16. 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   ---??08/10/16 AST and ALT now 107/170 respectively. Normal amylase and lipase. No abdominal pain or GI symptoms.   ---??08/13/16 ????AST 191 and ALT 283. Alk phos 177.  ---??08/18/16 ??AST 144 and ALT 264. Alk phos 218. Liver US: with patent hepatic vasculature notable for large simple hepatic cyst and gallstones.  ---??09/07/16: AST 136 and ALT 242. Alk phos 197. Stable.   ---??09/28/16: AST 91 and ALT 159. Alk phos 128. LFTs continue to improve.   --- 10/26/16: AST 84 and ALT 139. Alk phos 114. LFTs continue to improve.   --- 11/23/16:  AST 64 and ALT 102. Alk phos 96. LFTs continue to improve.   --- 12/22/16:  AST 51 and ALT 72. Alk phos 79. LFTs continue to improve.   --- 01/18/17: AST 42 and ALT 53. Alk phos 75. LFTs continue to improve.    --- 02/15/17: AST 47 and  ALT 53. Alk phos 71. Stable.   --- 06/21/17 - present: LFT WNL.   ??  **Bone Health:  - 03/31/16: DEXA scan showed osteoporosis of the spine and osteopenia of the femoral neck. Zometa and Ca/Vit D.   - 04/02/16: Received 1st dose of Zometa.   - 07/06/16. 2nd dose of Zometa. Completed. Continue Ca/Vit D supplement.   - 03/15/17: Patient received two doses of Zometa. Continue Ca/Vit D supplement. Vit D 38.9 WNL.   - 07/04/18: Will schedule Dexa scan and Zometa. Will coordinate with next visit.  - 09/06/18: Dexa scan indicative of low bone density.   - 12/06/18: Scheduled for Zometa. Complicated by significant extravasation.   - 03/06/19: Received Reclast.   ??  Plan:   - 3 yr. Clinically doing well. CBC stable. At this point relapse exteremly unlikely.   Will move to long term follow up.   - PFTs on 03/01/18: continue to improve.    - Patient has osteoporosis with diagnosis preceding allo-SCT. Repeat dexa scan indicative of low bone density - scheduled for Reclast.    - Follow up with Ramond Marrow for cardiomyopathy.         Willaim Bane, MD  [facility]

## 2019-04-24 DIAGNOSIS — C9201 Acute myeloblastic leukemia, in remission: Secondary | ICD-10-CM | POA: Diagnosis not present

## 2019-04-24 DIAGNOSIS — Z1211 Encounter for screening for malignant neoplasm of colon: Secondary | ICD-10-CM | POA: Diagnosis not present

## 2019-05-07 NOTE — Unmapped (Signed)
Patient Christine [name] was contacted today regarding rescheduling appt.'s from 09/04/2019 to 09/11/2019 . Voicemail was left for patient with information and to call back with any questions or concerns.

## 2019-05-22 ENCOUNTER — Other Ambulatory Visit: Payer: Self-pay

## 2019-05-22 ENCOUNTER — Ambulatory Visit (INDEPENDENT_AMBULATORY_CARE_PROVIDER_SITE_OTHER): Payer: PPO

## 2019-05-22 ENCOUNTER — Ambulatory Visit
Admission: EM | Admit: 2019-05-22 | Discharge: 2019-05-22 | Disposition: A | Discharge status: Home / Self Care | Payer: PPO | Attending: Family Medicine | Admitting: Family Medicine

## 2019-05-22 ENCOUNTER — Encounter: Payer: Self-pay | Admitting: Emergency Medicine

## 2019-05-22 DIAGNOSIS — M25531 Pain in right wrist: Secondary | ICD-10-CM

## 2019-05-22 DIAGNOSIS — M25532 Pain in left wrist: Secondary | ICD-10-CM

## 2019-05-22 DIAGNOSIS — S6991XA Unspecified injury of right wrist, hand and finger(s), initial encounter: Secondary | ICD-10-CM | POA: Diagnosis not present

## 2019-05-22 DIAGNOSIS — S6992XA Unspecified injury of left wrist, hand and finger(s), initial encounter: Secondary | ICD-10-CM | POA: Diagnosis not present

## 2019-05-22 NOTE — Discharge Instructions (Addendum)
Xrays negative.  Rest, ice, elevation.  Tylenol 1000 mg three times daily as needed.  Take care  Dr. Lacinda Axon

## 2019-05-22 NOTE — ED Triage Notes (Signed)
Patient c/o fall on Sunday. She states she face planted onto the curb. She is c/o bilateral wrist pain.

## 2019-05-22 NOTE — ED Provider Notes (Signed)
[facility]    CSN: [id number] Arrival date & time: 05/22/19  0851      History   Chief Complaint Chief Complaint  Patient presents with  . Fall  . Wrist Injury    bilateral   HPI  64 year old female presents with the above complaints.  Patient was walking on the sidewalk on Sunday.  She states that the sidewalk was uneven and she tripped and fell.  She has abrasions to the knees bilaterally and also has multiple abrasions to the face.  She has broken some of her teeth.  Patient reports that she believes that she placed her hands out to try and stop her fall.  She is complaining of bilateral wrist pain.  It appears that the left wrist is more painful than the right.  Left wrist is mildly swollen.  Pain 2/10 in severity.  Worse with certain ranges of motion.  No relieving factors.  No other associated symptoms.  Past Medical History:  Diagnosis Date  . Cancer ([place])    leukemia- post stem cell transplant  . Leukemia ([place])   . Osteoporosis   . Personal history of chemotherapy    Patient Active Problem List   Diagnosis Date Noted  . Gastritis 05/26/2016  . Secondary cardiomyopathy ([place]) 02/02/2016  . Screening for breast cancer 02/02/2016  . Acute myeloid leukemia ([place]) 08/26/2015  . Gout 08/25/2015  . Routine general medical examination at a health care facility 07/27/2014  . Osteoporosis 07/27/2014   Past Surgical History:  Procedure Laterality Date  . BREAST BIOPSY Left    mole removed above left nipple, no bx  . broken shoulder Right 2012   Plates and screws put in  . LIMBAL STEM CELL TRANSPLANT    . MOLE REMOVAL  1977   Removed from left breast  . SHOULDER SURGERY     OB History   No obstetric history on file.    Home Medications    Prior to Admission medications   Medication Sig Start Date End Date Taking? Authorizing Provider  Calcium Carb-Cholecalciferol 600-800 MG-UNIT TABS Take by mouth. 03/06/19  Yes [provider]    Family  History Family History  Problem Relation Age of Onset  . Arthritis Mother   . Hypertension Mother   . Cancer Mother 45       Breast  . Osteoporosis Mother   . Breast cancer Mother 63  . Parkinsonism Father   . Heart disease Maternal Uncle   . Cancer Paternal Grandfather        Oral    Social History Social History   Tobacco Use  . Smoking status: Never Smoker  . Smokeless tobacco: Never Used  Substance Use Topics  . Alcohol use: Not Currently    Alcohol/week: 0.0 standard drinks    Comment: Rare  . Drug use: No     Allergies   Dapsone, Rasburicase, Sulfasalazine, Penicillins, Sulfa antibiotics, Vancomycin, Cefepime, Morphine, Tape, and Tapentadol   Review of Systems Review of Systems  Musculoskeletal:       Bilateral wrist pain.  Skin:       Multiple abrasions.   Physical Exam Triage Vital Signs ED Triage Vitals  Enc Vitals Group     BP 05/22/19 0906 (!) 120/98     Pulse Rate 05/22/19 0906 99     Resp 05/22/19 0906 18     Temp 05/22/19 0906 98 F (36.7 C)     Temp Source 05/22/19 0906 Oral  SpO2 05/22/19 0906 99 %     Weight 05/22/19 0906 147 lb (66.7 kg)     Height 05/22/19 0906 5\' 4"  (1.626 m)     Head Circumference --      Peak Flow --      Pain Score 05/22/19 0905 2     Pain Loc --      Pain Edu? --      Excl. in [place]? --    Updated Vital Signs BP (!) 120/98 (BP Location: Right Arm)   Pulse 99   Temp 98 F (36.7 C) (Oral)   Resp 18   Ht 5\' 4"  (1.626 m)   Wt 66.7 kg   SpO2 99%   BMI 25.23 kg/m   Visual Acuity Right Eye Distance:   Left Eye Distance:   Bilateral Distance:    Right Eye Near:   Left Eye Near:    Bilateral Near:     Physical Exam Vitals and nursing note reviewed.  Constitutional:      General: She is not in acute distress.    Appearance: She is not ill-appearing.  HENT:     Head:     Comments: Patient with multiple facial abrasions.    Mouth/Throat:     Comments: Patient has chipped/fractures of the front  teeth. Eyes:     General:        Right eye: No discharge.        Left eye: No discharge.     Conjunctiva/sclera: Conjunctivae normal.  Cardiovascular:     Rate and Rhythm: Normal rate and regular rhythm.     Heart sounds: No murmur.  Pulmonary:     Effort: Pulmonary effort is normal.     Breath sounds: Normal breath sounds. No wheezing, rhonchi or rales.  Musculoskeletal:     Comments: Left wrist -mild swelling.  Mild tenderness over the distal radius.  Right wrist -normal range of motion.  No discrete areas of tenderness.  Neurological:     Mental Status: She is alert.  Psychiatric:        Mood and Affect: Mood normal.        Behavior: Behavior normal.    UC Treatments / Results  Labs (all labs ordered are listed, but only abnormal results are displayed) Labs Reviewed - No data to display  EKG   Radiology No results found.  Procedures Procedures (including critical care time)  Medications Ordered in UC Medications - No data to display  Initial Impression / Assessment and Plan / UC Course  I have reviewed the triage vital signs and the nursing notes.  Pertinent labs & imaging results that were available during my care of the patient were reviewed by me and considered in my medical decision making (see chart for details).    64 year old female presents with bilateral wrist pain after suffering a fall.  X-rays obtained and reviewed independently by me.  Interpretation: No acute fractures of the left or right wrist.  Advised rest, ice, elevation.  Tylenol and/or over-the-counter NSAIDs as needed for pain.  Supportive care.  Final Clinical Impressions(s) / UC Diagnoses   Final diagnoses:  None   Discharge Instructions   None    ED Prescriptions    None     PDMP not reviewed this encounter.   Coral Spikes, DO 05/22/19 1017

## 2019-06-28 ENCOUNTER — Other Ambulatory Visit: Payer: Self-pay

## 2019-06-28 ENCOUNTER — Ambulatory Visit: Payer: PPO | Admitting: Dermatology

## 2019-06-28 DIAGNOSIS — L814 Other melanin hyperpigmentation: Secondary | ICD-10-CM

## 2019-06-28 DIAGNOSIS — Z9481 Bone marrow transplant status: Secondary | ICD-10-CM | POA: Diagnosis not present

## 2019-06-28 DIAGNOSIS — Z1283 Encounter for screening for malignant neoplasm of skin: Secondary | ICD-10-CM | POA: Diagnosis not present

## 2019-06-28 DIAGNOSIS — D229 Melanocytic nevi, unspecified: Secondary | ICD-10-CM | POA: Diagnosis not present

## 2019-06-28 DIAGNOSIS — L578 Other skin changes due to chronic exposure to nonionizing radiation: Secondary | ICD-10-CM

## 2019-06-28 DIAGNOSIS — L821 Other seborrheic keratosis: Secondary | ICD-10-CM | POA: Diagnosis not present

## 2019-06-28 DIAGNOSIS — D489 Neoplasm of uncertain behavior, unspecified: Secondary | ICD-10-CM

## 2019-06-28 DIAGNOSIS — C44310 Basal cell carcinoma of skin of unspecified parts of face: Secondary | ICD-10-CM

## 2019-06-28 DIAGNOSIS — D2272 Melanocytic nevi of left lower limb, including hip: Secondary | ICD-10-CM

## 2019-06-28 DIAGNOSIS — C44319 Basal cell carcinoma of skin of other parts of face: Secondary | ICD-10-CM

## 2019-06-28 DIAGNOSIS — D1801 Hemangioma of skin and subcutaneous tissue: Secondary | ICD-10-CM

## 2019-06-28 DIAGNOSIS — L820 Inflamed seborrheic keratosis: Secondary | ICD-10-CM | POA: Diagnosis not present

## 2019-06-28 DIAGNOSIS — C44519 Basal cell carcinoma of skin of other part of trunk: Secondary | ICD-10-CM | POA: Diagnosis not present

## 2019-06-28 NOTE — Patient Instructions (Addendum)
Recommend daily broad spectrum sunscreen SPF 30+ to sun-exposed areas, reapply every 2 hours as needed. Call for new or changing lesions.  Wound Care Instructions  1. Cleanse wound gently with soap and water once a day then pat dry with clean gauze. Apply a thing coat of Petrolatum (petroleum jelly, "Vaseline") over the wound (unless you have an allergy to this). We recommend that you use a new, sterile tube of Vaseline. Do not pick or remove scabs. Do not remove the yellow or white "healing tissue" from the base of the wound.  2. Cover the wound with fresh, clean, nonstick gauze and secure with paper tape. You may use Band-Aids in place of gauze and tape if the would is small enough, but would recommend trimming much of the tape off as there is often too much. Sometimes Band-Aids can irritate the skin.  3. You should call the office for your biopsy report after 1 week if you have not already been contacted.  4. If you experience any problems, such as abnormal amounts of bleeding, swelling, significant bruising, significant pain, or evidence of infection, please call the office immediately.  5. FOR ADULT SURGERY PATIENTS: If you need something for pain relief you may take 1 extra strength Tylenol (acetaminophen) AND 2 Ibuprofen (200mg each) together every 4 hours as needed for pain. (do not take these if you are allergic to them or if you have a reason you should not take them.) Typically, you may only need pain medication for 1 to 3 days.    Electrodesiccation and Curettage ("Scrape and Burn") Wound Care Instructions  6. Leave the original bandage on for 24 hours if possible.  If the bandage becomes soaked or soiled before that time, it is OK to remove it and examine the wound.  A small amount of post-operative bleeding is normal.  If excessive bleeding occurs, remove the bandage, place gauze over the site and apply continuous pressure (no peeking) over the area for 30 minutes. If this does not  work, please call our clinic as soon as possible or page your doctor if it is after hours.   7. Once a day, cleanse the wound with soap and water. It is fine to shower. If a thick crust develops you may use a Q-tip dipped into dilute hydrogen peroxide (mix 1:1 with water) to dissolve it.  Hydrogen peroxide can slow the healing process, so use it only as needed.    8. After washing, apply petroleum jelly (Vaseline) or an antibiotic ointment if your doctor prescribed one for you, followed by a bandage.    9. For best healing, the wound should be covered with a layer of ointment at all times. If you are not able to keep the area covered with a bandage to hold the ointment in place, this may mean re-applying the ointment several times a day.  Continue this wound care until the wound has healed and is no longer open. It may take several weeks for the wound to heal and close.  Itching and mild discomfort is normal during the healing process.  If you have any discomfort, you can take Tylenol (acetaminophen) or ibuprofen as directed on the bottle. (Please do not take these if you have an allergy to them or cannot take them for another reason).  Some redness, tenderness and white or yellow material in the wound is normal healing.  If the area becomes very sore and red, or develops a thick yellow-green material (pus), it may   be infected; please notify us.    Wound healing continues for up to one year following surgery. It is not unusual to experience pain in the scar from time to time during the interval.  If the pain becomes severe or the scar thickens, you should notify the office.    A slight amount of redness in a scar is expected for the first six months.  After six months, the redness will fade and the scar will soften and fade.  The color difference becomes less noticeable with time.  If there are any problems, return for a post-op surgery check at your earliest convenience.  To improve the appearance  of the scar, you can use silicone scar gel, cream, or sheets (such as Mederma or Serica) every night for up to one year. These are available over the counter (without a prescription).  Please call our office at [phone] for any questions or concerns.  

## 2019-06-28 NOTE — Progress Notes (Signed)
Follow-Up Visit   Subjective  Robyn Montgomery is a 64 y.o. female who presents for the following: TBSE. The patient presents for Total-Body Skin Exam (TBSE) for skin cancer screening and mole check.  Patient presents today for her annual TBSE, Patient states she has a small area of concern on her forehead present for about 2 months and would like to have evaluated today. Patient does have an extensive  history of BCC as seen in her medical history  The following portions of the chart were reviewed this encounter and updated as appropriate:  Tobacco  Allergies  Meds  Problems  Med Hx  Surg Hx  Fam Hx      Review of Systems:  No other skin or systemic complaints except as noted in HPI or Assessment and Plan.  Objective  Well appearing patient in no apparent distress; mood and affect are within normal limits.  A full examination was performed including scalp, head, eyes, ears, nose, lips, neck, chest, axillae, abdomen, back, buttocks, bilateral upper extremities, bilateral lower extremities, hands, feet, fingers, toes, fingernails, and toenails. All findings within normal limits unless otherwise noted below.  Objective  Right Forehead: 0.5 Pearly pink papule  Objective  upper back spinal: 0.5 crusted papule   Objective  Left Mid post thigh:  0.5 cm raised brown mac  Objective  Left Breast: Erythematous keratotic or waxy stuck-on papule or plaque.    Assessment & Plan    Neoplasm of uncertain behavior (3) Right Forehead  Epidermal / dermal shaving with ED&C  Lesion diameter (cm):  0.5 Informed consent: discussed and consent obtained   Timeout: patient name, date of birth, surgical site, and procedure verified   Procedure prep:  Patient was prepped and draped in usual sterile fashion Prep type:  Isopropyl alcohol Anesthesia: the lesion was anesthetized in a standard fashion   Anesthetic:  1% lidocaine w/ epinephrine 1-100,000 buffered w/ 8.4% NaHCO3 Instrument  used: flexible razor blade   Hemostasis achieved with: pressure, aluminum chloride and electrodesiccation   Outcome: patient tolerated procedure well   Post-procedure details: sterile dressing applied and wound care instructions given   Dressing type: bandage and petrolatum   Additional details:  0.9cm post treatment defect  Destruction of lesion Complexity: extensive   Destruction method: electrodesiccation and curettage   Informed consent: discussed and consent obtained   Timeout:  patient name, date of birth, surgical site, and procedure verified Procedure prep:  Patient was prepped and draped in usual sterile fashion Prep type:  Isopropyl alcohol Anesthesia: the lesion was anesthetized in a standard fashion   Anesthetic:  1% lidocaine w/ epinephrine 1-100,000 buffered w/ 8.4% NaHCO3 Curettage performed in three different directions: Yes   Electrodesiccation performed over the curetted area: Yes   Lesion length (cm):  0.5 Lesion width (cm):  0.5 Margin per side (cm):  0.2 Final wound size (cm):  0.9 Hemostasis achieved with:  pressure, aluminum chloride and electrodesiccation Outcome: patient tolerated procedure well with no complications   Post-procedure details: sterile dressing applied and wound care instructions given   Dressing type: bandage and petrolatum    Specimen 1 - Surgical pathology Differential Diagnosis: R/O BCC Check Margins: No 0.5 Pearly pink papule  upper back spinal  Epidermal / dermal shaving with ED&C  Lesion diameter (cm):  0.5 Informed consent: discussed and consent obtained   Timeout: patient name, date of birth, surgical site, and procedure verified   Procedure prep:  Patient was prepped and draped in usual sterile  fashion Prep type:  Isopropyl alcohol Anesthesia: the lesion was anesthetized in a standard fashion   Anesthetic:  1% lidocaine w/ epinephrine 1-100,000 buffered w/ 8.4% NaHCO3 Instrument used: flexible razor blade   Hemostasis  achieved with: pressure, aluminum chloride and electrodesiccation   Outcome: patient tolerated procedure well   Post-procedure details: sterile dressing applied and wound care instructions given   Dressing type: bandage and petrolatum   Additional details:  0.9 cm post treatment defect  Destruction of lesion Complexity: extensive   Destruction method: electrodesiccation and curettage   Informed consent: discussed and consent obtained   Timeout:  patient name, date of birth, surgical site, and procedure verified Procedure prep:  Patient was prepped and draped in usual sterile fashion Prep type:  Isopropyl alcohol Anesthesia: the lesion was anesthetized in a standard fashion   Anesthetic:  1% lidocaine w/ epinephrine 1-100,000 buffered w/ 8.4% NaHCO3 Curettage performed in three different directions: Yes   Electrodesiccation performed over the curetted area: Yes   Lesion length (cm):  0.5 Lesion width (cm):  0.5 Margin per side (cm):  0.2 Final wound size (cm):  0.9 Hemostasis achieved with:  pressure, aluminum chloride and electrodesiccation Outcome: patient tolerated procedure well with no complications   Post-procedure details: sterile dressing applied and wound care instructions given   Dressing type: bandage and petrolatum    Specimen 2 - Surgical pathology Differential Diagnosis: R/O BCC  Check Margins: No 0.5 crusted papule   Left Mid post thigh  Epidermal / dermal shaving   Lesion diameter (cm):  0.5 Informed consent: discussed and consent obtained   Timeout: patient name, date of birth, surgical site, and procedure verified   Procedure prep:  Patient was prepped and draped in usual sterile fashion Prep type:  Isopropyl alcohol Anesthesia: the lesion was anesthetized in a standard fashion   Anesthetic:  1% lidocaine w/ epinephrine 1-100,000 buffered w/ 8.4% NaHCO3 Instrument used: flexible razor blade   Hemostasis achieved with: pressure, aluminum chloride and  electrodesiccation   Outcome: patient tolerated procedure well   Post-procedure details: sterile dressing applied and wound care instructions given   Dressing type: bandage and petrolatum   Additional details:  0.9  cm post treatment defect  Specimen 3 - Surgical pathology Differential Diagnosis: R/O Dysplasia Check Margins: No 0.5 cm raised brown macule  Shave removal with ED& C on Right Forehead and Upper Back Spinal Shave removal Left Mid Post Thigh  Inflamed seborrheic keratosis Left Breast  Cryotherapy today  Prior to procedure, discussed risks of blister formation, small wound, skin dyspigmentation, or rare scar following cryotherapy.    Destruction of lesion - Left Breast Complexity: simple   Destruction method: cryotherapy   Informed consent: discussed and consent obtained   Timeout:  patient name, date of birth, surgical site, and procedure verified Lesion destroyed using liquid nitrogen: Yes   Region frozen until ice ball extended beyond lesion: Yes   Outcome: patient tolerated procedure well with no complications   Post-procedure details: wound care instructions given     Lentigines - Scattered tan macules - Discussed due to sun exposure - Benign, observe - Call for any changes  Seborrheic Keratoses - Stuck-on, waxy, tan-brown papules and plaques  - Discussed benign etiology and prognosis. - Observe - Call for any changes  Melanocytic Nevi - Tan-brown and/or pink-flesh-colored symmetric macules and papules - Benign appearing on exam today - Observation - Call clinic for new or changing moles - Recommend daily use of broad spectrum spf 30+  sunscreen to sun-exposed areas.   Hemangiomas - Red papules - Discussed benign nature - Observe - Call for any changes  Actinic Damage - diffuse scaly erythematous macules with underlying dyspigmentation - Recommend daily broad spectrum sunscreen SPF 30+ to sun-exposed areas, reapply every 2 hours as needed.  -  Call for new or changing lesions.  Skin cancer screening performed today.  Pt is S/P Bone Marrow Transplant for Lymphoproliferative Cancer.   Return in about 6 months (around 12/28/2019) for TBSE.  I, Donzetta Kohut, CMA, am acting as scribe for Sarina Ser, MD . Documentation: I have reviewed the above documentation for accuracy and completeness, and I agree with the above.  Sarina Ser, MD

## 2019-07-01 ENCOUNTER — Encounter: Payer: Self-pay | Admitting: Dermatology

## 2019-07-02 ENCOUNTER — Telehealth: Payer: Self-pay

## 2019-07-02 NOTE — Telephone Encounter (Signed)
Patient advised of biopsy results. She has scheduled surgery for severe dysplastic.

## 2019-07-02 NOTE — Telephone Encounter (Signed)
Patient returned [name]'s phone call. Left message for her to return my call.

## 2019-07-02 NOTE — Telephone Encounter (Signed)
-----   Message from Ralene Bathe, MD sent at 06/29/2019  7:27 PM EDT ----- 1. Skin , right forehead BASAL CELL CARCINOMA, NODULAR AND INFILTRATIVE PATTERNS 2. Skin , upper back spinal BASAL CELL CARCINOMA, NODULAR PATTERN 3. Skin , left mid post thigh DYSPLASTIC JUNCTIONAL NEVUS WITH SEVERE ATYPIA,  1&2- both cancer - BCC Both already treated 3- Severe dysplastic Schedule surgery

## 2019-07-02 NOTE — Telephone Encounter (Signed)
Left message on voicemail to return my call.  

## 2019-07-23 ENCOUNTER — Other Ambulatory Visit (INDEPENDENT_AMBULATORY_CARE_PROVIDER_SITE_OTHER): Payer: PPO

## 2019-07-23 ENCOUNTER — Other Ambulatory Visit: Payer: Self-pay

## 2019-07-23 DIAGNOSIS — Z0000 Encounter for general adult medical examination without abnormal findings: Secondary | ICD-10-CM

## 2019-07-23 LAB — COMPREHENSIVE METABOLIC PANEL
ALT: 15 U/L (ref 0–35)
AST: 17 U/L (ref 0–37)
Albumin: 4.6 g/dL (ref 3.5–5.2)
Alkaline Phosphatase: 76 U/L (ref 39–117)
BUN: 7 mg/dL (ref 6–23)
CO2: 29 mEq/L (ref 19–32)
Calcium: 10.2 mg/dL (ref 8.4–10.5)
Chloride: 103 mEq/L (ref 96–112)
Creatinine, Ser: 0.9 mg/dL (ref 0.40–1.20)
GFR: 62.6 mL/min (ref >60.00)
Glucose, Bld: 97 mg/dL (ref 70–99)
Potassium: 4.1 mEq/L (ref 3.5–5.1)
Sodium: 140 mEq/L (ref 135–145)
Total Bilirubin: 0.5 mg/dL (ref 0.2–1.2)
Total Protein: 7.2 g/dL (ref 6.0–8.3)

## 2019-07-23 LAB — LIPID PANEL
Cholesterol: 242 mg/dL — ABNORMAL HIGH (ref 0–200)
HDL: 51.7 mg/dL (ref >39.00)
NonHDL: 190.07
Total CHOL/HDL Ratio: 5
Triglycerides: 238 mg/dL — ABNORMAL HIGH (ref 0.0–149.0)
VLDL: 47.6 mg/dL — ABNORMAL HIGH (ref 0.0–40.0)

## 2019-07-23 LAB — CBC WITH DIFFERENTIAL/PLATELET
Basophils Absolute: 0 10*3/uL (ref 0.0–0.1)
Basophils Relative: 0.4 % (ref 0.0–3.0)
Eosinophils Absolute: 0.1 10*3/uL (ref 0.0–0.7)
Eosinophils Relative: 2.9 % (ref 0.0–5.0)
HCT: 38 % (ref 36.0–46.0)
Hemoglobin: 13.1 g/dL (ref 12.0–15.0)
Lymphocytes Relative: 33.3 % (ref 12.0–46.0)
Lymphs Abs: 1.2 10*3/uL (ref 0.7–4.0)
MCHC: 34.3 g/dL (ref 30.0–36.0)
MCV: 97.9 fl (ref 78.0–100.0)
Monocytes Absolute: 0.3 10*3/uL (ref 0.1–1.0)
Monocytes Relative: 9.6 % (ref 3.0–12.0)
Neutro Abs: 1.9 10*3/uL (ref 1.4–7.7)
Neutrophils Relative %: 53.8 % (ref 43.0–77.0)
Platelets: 247 10*3/uL (ref 150.0–400.0)
RBC: 3.89 Mil/uL (ref 3.87–5.11)
RDW: 13.5 % (ref 11.5–15.5)
WBC: 3.5 10*3/uL — ABNORMAL LOW (ref 4.0–10.5)

## 2019-07-23 LAB — LDL CHOLESTEROL, DIRECT: Direct LDL: 152 mg/dL

## 2019-07-23 LAB — HEMOGLOBIN A1C: Hgb A1c MFr Bld: 5.6 % (ref 4.6–6.5)

## 2019-07-23 LAB — TSH: TSH: 1.78 u[IU]/mL (ref 0.35–4.50)

## 2019-07-23 LAB — VITAMIN D 25 HYDROXY (VIT D DEFICIENCY, FRACTURES): VITD: 47.96 ng/mL (ref 30.00–100.00)

## 2019-07-24 ENCOUNTER — Other Ambulatory Visit: Payer: Self-pay | Admitting: Family

## 2019-07-24 DIAGNOSIS — R7989 Other specified abnormal findings of blood chemistry: Secondary | ICD-10-CM

## 2019-07-27 ENCOUNTER — Encounter: Payer: Self-pay | Admitting: Family

## 2019-07-27 DIAGNOSIS — E785 Hyperlipidemia, unspecified: Secondary | ICD-10-CM | POA: Insufficient documentation

## 2019-07-31 ENCOUNTER — Encounter: Payer: Self-pay | Admitting: Family

## 2019-07-31 ENCOUNTER — Other Ambulatory Visit (HOSPITAL_COMMUNITY)
Admission: RE | Admit: 2019-07-31 | Discharge: 2019-07-31 | Disposition: A | Discharge status: Home / Self Care | Payer: PPO | Source: Ambulatory Visit | Attending: Family | Admitting: Family

## 2019-07-31 ENCOUNTER — Other Ambulatory Visit: Payer: Self-pay

## 2019-07-31 ENCOUNTER — Ambulatory Visit (INDEPENDENT_AMBULATORY_CARE_PROVIDER_SITE_OTHER): Payer: PPO | Admitting: Family

## 2019-07-31 VITALS — BP 106/70 | HR 95 | Temp 98.1°F | Resp 15 | Ht 64.0 in | Wt 145.0 lb

## 2019-07-31 DIAGNOSIS — I700 Atherosclerosis of aorta: Secondary | ICD-10-CM | POA: Diagnosis not present

## 2019-07-31 DIAGNOSIS — D72819 Decreased white blood cell count, unspecified: Secondary | ICD-10-CM | POA: Diagnosis not present

## 2019-07-31 DIAGNOSIS — Z0000 Encounter for general adult medical examination without abnormal findings: Secondary | ICD-10-CM | POA: Diagnosis not present

## 2019-07-31 DIAGNOSIS — Z1151 Encounter for screening for human papillomavirus (HPV): Secondary | ICD-10-CM | POA: Insufficient documentation

## 2019-07-31 NOTE — Assessment & Plan Note (Signed)
CBE and pap smear performed today. Patient will schedule mammogram.

## 2019-07-31 NOTE — Patient Instructions (Addendum)
Please discuss low WBCs with Dr Oretha Ellis Please discuss your overall cardiovascular risk, and cholesterol medication with cardiology as we discussed as well.  Please call  and schedule your 3D mammogram as discussed.   [place]  [place], [place]   Health Maintenance, Female Adopting a healthy lifestyle and getting preventive care are important in promoting health and wellness. Ask your health care provider about:  The right schedule for you to have regular tests and exams.  Things you can do on your own to prevent diseases and keep yourself healthy. What should I know about diet, weight, and exercise? Eat a healthy diet   Eat a diet that includes plenty of vegetables, fruits, low-fat dairy products, and lean protein.  Do not eat a lot of foods that are high in solid fats, added sugars, or sodium. Maintain a healthy weight Body mass index (BMI) is used to identify weight problems. It estimates body fat based on height and weight. Your health care provider can help determine your BMI and help you achieve or maintain a healthy weight. Get regular exercise Get regular exercise. This is one of the most important things you can do for your health. Most adults should:  Exercise for at least 150 minutes each week. The exercise should increase your heart rate and make you sweat (moderate-intensity exercise).  Do strengthening exercises at least twice a week. This is in addition to the moderate-intensity exercise.  Spend less time sitting. Even light physical activity can be beneficial. Watch cholesterol and blood lipids Have your blood tested for lipids and cholesterol at 66 years of age, then have this test every 5 years. Have your cholesterol levels checked more often if:  Your lipid or cholesterol levels are high.  You are older than 66 years of age.  You are at high risk for heart disease. What should I know about cancer  screening? Depending on your health history and family history, you may need to have cancer screening at various ages. This may include screening for:  Breast cancer.  Cervical cancer.  Colorectal cancer.  Skin cancer.  Lung cancer. What should I know about heart disease, diabetes, and high blood pressure? Blood pressure and heart disease  High blood pressure causes heart disease and increases the risk of stroke. This is more likely to develop in people who have high blood pressure readings, are of African descent, or are overweight.  Have your blood pressure checked: ? Every 3-5 years if you are 62-68 years of age. ? Every year if you are 27 years old or older. Diabetes Have regular diabetes screenings. This checks your fasting blood sugar level. Have the screening done:  Once every three years after age 80 if you are at a normal weight and have a low risk for diabetes.  More often and at a younger age if you are overweight or have a high risk for diabetes. What should I know about preventing infection? Hepatitis B If you have a higher risk for hepatitis B, you should be screened for this virus. Talk with your health care provider to find out if you are at risk for hepatitis B infection. Hepatitis C Testing is recommended for:  Everyone born from [birth year] through 1967.  Anyone with known risk factors for hepatitis C. Sexually transmitted infections (STIs)  Get screened for STIs, including gonorrhea and chlamydia, if: ? You are sexually active and are younger than 66 years of age. ?  You are older than 66 years of age and your health care provider tells you that you are at risk for this type of infection. ? Your sexual activity has changed since you were last screened, and you are at increased risk for chlamydia or gonorrhea. Ask your health care provider if you are at risk.  Ask your health care provider about whether you are at high risk for HIV. Your health care provider may  recommend a prescription medicine to help prevent HIV infection. If you choose to take medicine to prevent HIV, you should first get tested for HIV. You should then be tested every 3 months for as long as you are taking the medicine. Pregnancy  If you are about to stop having your period (premenopausal) and you may become pregnant, seek counseling before you get pregnant.  Take 400 to 800 micrograms (mcg) of folic acid every day if you become pregnant.  Ask for birth control (contraception) if you want to prevent pregnancy. Osteoporosis and menopause Osteoporosis is a disease in which the bones lose minerals and strength with aging. This can result in bone fractures. If you are 31 years old or older, or if you are at risk for osteoporosis and fractures, ask your health care provider if you should:  Be screened for bone loss.  Take a calcium or vitamin D supplement to lower your risk of fractures.  Be given hormone replacement therapy (HRT) to treat symptoms of menopause. Follow these instructions at home: Lifestyle  Do not use any products that contain nicotine or tobacco, such as cigarettes, e-cigarettes, and chewing tobacco. If you need help quitting, ask your health care provider.  Do not use street drugs.  Do not share needles.  Ask your health care provider for help if you need support or information about quitting drugs. Alcohol use  Do not drink alcohol if: ? Your health care provider tells you not to drink. ? You are pregnant, may be pregnant, or are planning to become pregnant.  If you drink alcohol: ? Limit how much you use to 0-1 drink a day. ? Limit intake if you are breastfeeding.  Be aware of how much alcohol is in your drink. In the [place] one drink equals one 12 oz bottle of beer (355 mL), one 5 oz glass of wine (148 mL), or one 1 oz glass of hard liquor (44 mL). General instructions  Schedule regular health, dental, and eye exams.  Stay current with your  vaccines.  Tell your health care provider if: ? You often feel depressed. ? You have ever been abused or do not feel safe at home. Summary  Adopting a healthy lifestyle and getting preventive care are important in promoting health and wellness.  Follow your health care provider's instructions about healthy diet, exercising, and getting tested or screened for diseases.  Follow your health care provider's instructions on monitoring your cholesterol and blood pressure. This information is not intended to replace advice given to you by your health care provider. Make sure you discuss any questions you have with your health care provider. Document Revised: 12/21/2017 Document Reviewed: 12/21/2017 Elsevier Patient Education  2020 [employer].

## 2019-07-31 NOTE — Assessment & Plan Note (Addendum)
Appears aymptomatic. H/o ACL and s/p stem cell transplant and chemotherapy. WBC appears low normal chronically as low as 3.2 in the last year. She upcoming appointment with oncology and would like to discuss with hematology/oncology prior to repeating WBC in our office.

## 2019-07-31 NOTE — Assessment & Plan Note (Addendum)
Known atherosclerosis. Overall low CVD risk. No early CVD in first degree relative. patient declines statin medication.  She would like to further discuss with her cardiologist which I think is very appropriate.

## 2019-07-31 NOTE — Progress Notes (Signed)
Subjective:    Patient ID: [name], female    DOB: May 19, 1955, 64 y.o.   MRN: [id number]  CC: KIRSTIN KUGLER is a 64 y.o. female who presents today for physical exam.    HPI: Feels well today No complaints.    Atherosclerosis- early cvd death in her family. Pleased with cholesterol improvement with lifestyle changes.   Leukopenia- states that this has been chronic for her.   Cardiology- Dr Stefanie Libel ; follow up scheduled end of 2021.   AML in remission- following with Dr Oretha Ellis, every 6 months.  Colorectal Cancer Screening: UTD ; due 11/2023 per recent visit with Gerarda Gunther Breast Cancer Screening: Mammogram due Cervical Cancer Screening: due Bone Health screening/DEXA for 65+: UTD 09/06/18 with Dr Oretha Ellis.  Lung Cancer Screening: Doesn't have 30 year pack year history and age > 29 years. Immunizations       Tetanus - utd       Pneumococcal -received Prevnar 13 before 65, Pneumovax 23 after 65, vaccination complete     Labs: Screening labs done prior Exercise: Gets regular exercise.  Alcohol use: none Smoking/tobacco use: Nonsmoker.   Follows with Dr Nehemiah Massed, dermatology; h/o [facility]  HISTORY:  Past Medical History:  Diagnosis Date  . Basal cell carcinoma 01/20/2006   left upper back paraspinal  . Basal cell carcinoma 06/22/2012   right sup med scapula, right med breast  . Basal cell carcinoma 10/04/2013   right side near costal area, right chest  . Basal cell carcinoma 10/07/2014   left mid back 2.0 cm lat to spine above braline  . Basal cell carcinoma 05/26/2015   right sup nasolabial  . Basal cell carcinoma 06/23/2016   left midline forehead  . Basal cell carcinoma 12/13/2016   left sup chest, right lat elbow, right prox med calf  . Basal cell carcinoma 06/13/2017   left preauricular, left medial scapula  . Basal cell carcinoma 06/19/2018   left ant lat thigh, right mid pretibial  . Basal cell carcinoma 12/25/2018   right sup med scapula  . Cancer  ([place])    leukemia- post stem cell transplant  . Dysplastic nevus 01/16/2007   left upper gastric  . Dysplastic nevus 10/04/2013   right UQA, right sacral lower back  . Dysplastic nevus 06/23/2016   right prox tricep  . Leukemia ([place])   . Osteoporosis   . Personal history of chemotherapy     Past Surgical History:  Procedure Laterality Date  . BREAST BIOPSY Left    mole removed above left nipple, no bx  . broken shoulder Right 2012   Plates and screws put in  . LIMBAL STEM CELL TRANSPLANT    . MOLE REMOVAL  1977   Removed from left breast  . SHOULDER SURGERY     Family History  Problem Relation Age of Onset  . Arthritis Mother   . Hypertension Mother   . Cancer Mother 26       Breast  . Osteoporosis Mother   . Breast cancer Mother 56  . Parkinsonism Father   . Heart disease Maternal Uncle   . Cancer Paternal Grandfather        Oral      ALLERGIES: Dapsone, Rasburicase, Sulfasalazine, Penicillins, Sulfa antibiotics, Vancomycin, Cefepime, Morphine, Tape, and Tapentadol  Current Outpatient Medications on File Prior to Visit  Medication Sig Dispense Refill  . ascorbic acid (VITAMIN C) 500 MG tablet Take by mouth.    . Calcium Carb-Cholecalciferol 600-800 MG-UNIT  TABS Take by mouth.     No current facility-administered medications on file prior to visit.    Social History   Tobacco Use  . Smoking status: Never Smoker  . Smokeless tobacco: Never Used  Vaping Use  . Vaping Use: Never used  Substance Use Topics  . Alcohol use: Not Currently    Alcohol/week: 0.0 standard drinks    Comment: Rare  . Drug use: No    Review of Systems  Constitutional: Negative for chills, fatigue, fever and unexpected weight change.  HENT: Negative for congestion.   Respiratory: Negative for cough.   Cardiovascular: Negative for chest pain, palpitations and leg swelling.  Gastrointestinal: Negative for nausea and vomiting.  Musculoskeletal: Negative for arthralgias and myalgias.    Skin: Negative for rash.  Neurological: Negative for headaches.  Hematological: Negative for adenopathy.  Psychiatric/Behavioral: Negative for confusion.      Objective:    BP 106/70 (BP Location: Left Arm, Patient Position: Sitting, Cuff Size: Normal)   Pulse 95   Temp 98.1 F (36.7 C) (Oral)   Resp 15   Ht 5\' 4"  (1.626 m)   Wt 145 lb (65.8 kg)   SpO2 98%   BMI 24.89 kg/m   BP Readings from Last 3 Encounters:  07/31/19 106/70  05/22/19 (!) 120/98  07/26/18 120/78   Wt Readings from Last 3 Encounters:  07/31/19 145 lb (65.8 kg)  05/22/19 147 lb (66.7 kg)  07/26/18 143 lb (64.9 kg)    Physical Exam Vitals reviewed.  Constitutional:      Appearance: She is well-developed.  Eyes:     Conjunctiva/sclera: Conjunctivae normal.  Neck:     Thyroid: No thyroid mass or thyromegaly.  Cardiovascular:     Rate and Rhythm: Normal rate and regular rhythm.     Pulses: Normal pulses.     Heart sounds: Normal heart sounds.  Pulmonary:     Effort: Pulmonary effort is normal.     Breath sounds: Normal breath sounds. No wheezing, rhonchi or rales.  Chest:     Breasts: Breasts are symmetrical.        Right: No inverted nipple, mass, nipple discharge, skin change or tenderness.        Left: No inverted nipple, mass, nipple discharge, skin change or tenderness.  Genitourinary:    Cervix: No cervical motion tenderness, discharge or friability.     Uterus: Not enlarged, not fixed and not tender.      Adnexa:        Right: No mass, tenderness or fullness.         Left: No mass, tenderness or fullness.       Comments: Pap performed. No CMT. Unable to appreciated ovaries. Lymphadenopathy:     Head:     Right side of head: No submental, submandibular, tonsillar, preauricular, posterior auricular or occipital adenopathy.     Left side of head: No submental, submandibular, tonsillar, preauricular, posterior auricular or occipital adenopathy.     Cervical:     Right cervical: No  superficial, deep or posterior cervical adenopathy.    Left cervical: No superficial, deep or posterior cervical adenopathy.     Upper Body:     Right upper body: No pectoral adenopathy.     Left upper body: No pectoral adenopathy.  Skin:    General: Skin is warm and dry.  Neurological:     Mental Status: She is alert.  Psychiatric:        Speech: Speech normal.  Behavior: Behavior normal.        Thought Content: Thought content normal.        Assessment & Plan:   Problem List Items Addressed This Visit      Cardiovascular and Mediastinum   Atherosclerosis of aorta (HCC)    Known atherosclerosis. Overall low CVD risk. No early CVD in first degree relative. patient declines statin medication.  She would like to further discuss with her cardiologist which I think is very appropriate.        Other   Leukopenia    Appears aymptomatic. H/o ACL and s/p stem cell transplant and chemotherapy. WBC appears low normal chronically as low as 3.2 in the last year. She upcoming appointment with oncology and would like to discuss with hematology/oncology prior to repeating WBC in our office.       Routine general medical examination at a health care facility - Primary    CBE and pap smear performed today. Patient will schedule mammogram.      Relevant Orders   Cytology - PAP   MM 3D SCREEN BREAST BILATERAL       I am having [place] "Pam" maintain her Calcium Carb-Cholecalciferol and ascorbic acid.   No orders of the defined types were placed in this encounter.   Return precautions given.   Risks, benefits, and alternatives of the medications and treatment plan prescribed today were discussed, and patient expressed understanding.   Education regarding symptom management and diagnosis given to patient on AVS.   Continue to follow with Burnard Hawthorne, FNP for routine health maintenance.   Saline and I agreed with plan.   Mable Paris, FNP

## 2019-08-01 LAB — CYTOLOGY - PAP
Comment: NEGATIVE
Diagnosis: NEGATIVE
High risk HPV: NEGATIVE

## 2019-08-14 ENCOUNTER — Other Ambulatory Visit: Payer: Self-pay

## 2019-08-14 ENCOUNTER — Ambulatory Visit: Payer: PPO | Admitting: Dermatology

## 2019-08-14 ENCOUNTER — Encounter: Payer: Self-pay | Admitting: Dermatology

## 2019-08-14 DIAGNOSIS — D239 Other benign neoplasm of skin, unspecified: Secondary | ICD-10-CM

## 2019-08-14 DIAGNOSIS — D2372 Other benign neoplasm of skin of left lower limb, including hip: Secondary | ICD-10-CM

## 2019-08-14 DIAGNOSIS — L988 Other specified disorders of the skin and subcutaneous tissue: Secondary | ICD-10-CM | POA: Diagnosis not present

## 2019-08-14 DIAGNOSIS — Z85828 Personal history of other malignant neoplasm of skin: Secondary | ICD-10-CM

## 2019-08-14 HISTORY — DX: Other benign neoplasm of skin, unspecified: D23.9

## 2019-08-14 MED ORDER — MUPIROCIN 2 % EX OINT: 1.0000 "application " | TOPICAL_OINTMENT | Freq: Every day | CUTANEOUS | 0 refills | Status: DC

## 2019-08-14 NOTE — Patient Instructions (Signed)

## 2019-08-14 NOTE — Progress Notes (Addendum)
   Follow-Up Visit   Subjective  Robyn Montgomery is a 64 y.o. female who presents for the following: Bx proven severe dysplastic nevus (L mid post thigh, pt presents for excision) and hx of BCC x 2 (R forehead, upper back spinal, bx and edc 06/28/19).  The following portions of the chart were reviewed this encounter and updated as appropriate:  Tobacco  Allergies  Meds  Problems  Med Hx  Surg Hx  Fam Hx      Review of Systems:  No other skin or systemic complaints except as noted in HPI or Assessment and Plan.  Objective  Well appearing patient in no apparent distress; mood and affect are within normal limits.  A focused examination was performed including L post thigh, upper back, face. Relevant physical exam findings are noted in the Assessment and Plan.  Objective  Left mid posterior thigh: Pink bx site 0.8cm  Objective  R forehead, upper back spinal: Healing bx/edc sites   Assessment & Plan  Dysplastic nevus Left mid posterior thigh  Skin excision - Left mid posterior thigh  Lesion length (cm):  0.8 Lesion width (cm):  0.8 Margin per side (cm):  0.2 Total excision diameter (cm):  1.2 Informed consent: discussed and consent obtained   Timeout: patient name, date of birth, surgical site, and procedure verified   Procedure prep:  Patient was prepped and draped in usual sterile fashion Prep type:  Isopropyl alcohol and povidone-iodine Anesthesia: the lesion was anesthetized in a standard fashion   Anesthesia comment:  10.0cc Anesthetic:  1% lidocaine w/ epinephrine 1-100,000 buffered w/ 8.4% NaHCO3 Instrument used: #15 blade   Hemostasis achieved with: pressure   Hemostasis achieved with comment:  Electrocautery Outcome: patient tolerated procedure well with no complications   Post-procedure details: sterile dressing applied and wound care instructions given   Dressing type: bandage and pressure dressing (Mupirocin)    Skin repair - Left mid posterior  thigh Complexity:  Complex Final length (cm):  3.5 Reason for type of repair: reduce tension to allow closure, reduce the risk of dehiscence, infection, and necrosis, reduce subcutaneous dead space and avoid a hematoma, allow closure of the large defect, preserve normal anatomy, preserve normal anatomical and functional relationships and enhance both functionality and cosmetic results   Undermining: area extensively undermined   Undermining comment:  Undermining Defect 1.2 Subcutaneous layers (deep stitches):  Suture size:  3-0 Suture type: Vicryl (polyglactin 910)   Subcutaneous suture technique: Inverted Dermal. Fine/surface layer approximation (top stitches):  Suture size:  3-0 Suture type: nylon   Stitches: simple running   Stitches comment:  Nylon Suture removal (days):  7 Hemostasis achieved with: suture and pressure Outcome: patient tolerated procedure well with no complications   Post-procedure details: sterile dressing applied and wound care instructions given   Dressing type: bandage and pressure dressing (Mupirocin)    mupirocin ointment (BACTROBAN) 2 % - Left mid posterior thigh  Specimen 1 - Surgical pathology Differential Diagnosis: D48.5 Dysplastic Nevus Check Margins: yes 0.8cm pink bx site [id number]  History of basal cell carcinoma (BCC) R forehead, upper back spinal  Sites healing well from 06/28/19 bx and edc.  Clear, observe for changes   Return in about 1 week (around 08/21/2019) for sr.   Documentation: I have reviewed the above documentation for accuracy and completeness, and I agree with the above.  Sarina Ser, MD

## 2019-08-15 ENCOUNTER — Encounter: Payer: Self-pay | Admitting: Dermatology

## 2019-08-15 ENCOUNTER — Ambulatory Visit
Admission: RE | Admit: 2019-08-15 | Discharge: 2019-08-15 | Disposition: A | Discharge status: Home / Self Care | Payer: PPO | Source: Ambulatory Visit | Attending: Family | Admitting: Family

## 2019-08-15 ENCOUNTER — Telehealth: Payer: Self-pay

## 2019-08-15 DIAGNOSIS — Z1231 Encounter for screening mammogram for malignant neoplasm of breast: Secondary | ICD-10-CM | POA: Insufficient documentation

## 2019-08-15 DIAGNOSIS — Z0000 Encounter for general adult medical examination without abnormal findings: Secondary | ICD-10-CM

## 2019-08-15 NOTE — Telephone Encounter (Signed)
Pt doing fine after yesterdays surgery./sh 

## 2019-08-21 ENCOUNTER — Other Ambulatory Visit: Payer: Self-pay

## 2019-08-21 ENCOUNTER — Ambulatory Visit (INDEPENDENT_AMBULATORY_CARE_PROVIDER_SITE_OTHER): Payer: PPO | Admitting: Dermatology

## 2019-08-21 DIAGNOSIS — D239 Other benign neoplasm of skin, unspecified: Secondary | ICD-10-CM

## 2019-08-21 NOTE — Progress Notes (Signed)
   Follow-Up Visit   Subjective  Robyn Montgomery is a 64 y.o. female who presents for the following: post op/suture removal (patient is here today for suture removal and to discuss pathology results).  The following portions of the chart were reviewed this encounter and updated as appropriate:  Tobacco  Allergies  Meds  Problems  Med Hx  Surg Hx  Fam Hx     Review of Systems:  No other skin or systemic complaints except as noted in HPI or Assessment and Plan.  Objective  Well appearing patient in no apparent distress; mood and affect are within normal limits.  A focused examination was performed including the legs. Relevant physical exam findings are noted in the Assessment and Plan.  Objective  Left Thigh - Posterior: Healing excision site  Assessment & Plan  Dysplastic nevus - severe - margins free Left Thigh - Posterior  Encounter for Removal of Sutures - Incision site at the L post thigh is clean, dry and intact - Wound cleansed, sutures removed, wound cleansed and steri strips applied.  - Discussed pathology results showing margins free, no residual dysplastic nevus. - Patient advised to keep steri-strips dry until they fall off. - Scars remodel for a full year. - Once steri-strips fall off, patient can apply over-the-counter silicone scar cream each night to help with scar remodeling if desired. - Patient advised to call with any concerns or if they notice any new or changing lesions.   Other Related Medications mupirocin ointment (BACTROBAN) 2 %  Return for appointment as scheduled.  Luther Redo, CMA, am acting as scribe for Sarina Ser, MD .  Documentation: I have reviewed the above documentation for accuracy and completeness, and I agree with the above.  Sarina Ser, MD

## 2019-08-22 ENCOUNTER — Encounter: Payer: Self-pay | Admitting: Dermatology

## 2019-09-10 DIAGNOSIS — Z9481 Bone marrow transplant status: Principal | ICD-10-CM

## 2019-09-11 ENCOUNTER — Other Ambulatory Visit: Admit: 2019-09-11 | Discharge: 2019-09-11 | Payer: PRIVATE HEALTH INSURANCE

## 2019-09-11 ENCOUNTER — Ambulatory Visit: Admit: 2019-09-11 | Discharge: 2019-09-11 | Payer: PRIVATE HEALTH INSURANCE

## 2019-09-11 DIAGNOSIS — Z9481 Bone marrow transplant status: Principal | ICD-10-CM

## 2019-09-11 DIAGNOSIS — C9201 Acute myeloblastic leukemia, in remission: Principal | ICD-10-CM

## 2019-09-11 DIAGNOSIS — M8588 Other specified disorders of bone density and structure, other site: Principal | ICD-10-CM

## 2019-09-11 LAB — CBC W/ AUTO DIFF
BASOPHILS ABSOLUTE COUNT: 0 10*9/L (ref 0.0–0.1)
BASOPHILS RELATIVE PERCENT: 0.3 %
EOSINOPHILS ABSOLUTE COUNT: 0.2 10*9/L (ref 0.0–0.4)
EOSINOPHILS RELATIVE PERCENT: 4 %
LARGE UNSTAINED CELLS: 2 % (ref 0–4)
LYMPHOCYTES ABSOLUTE COUNT: 1.1 10*9/L — ABNORMAL LOW (ref 1.5–5.0)
LYMPHOCYTES RELATIVE PERCENT: 29.1 %
MEAN CORPUSCULAR HEMOGLOBIN CONC: 33.5 g/dL (ref 31.0–37.0)
MEAN CORPUSCULAR HEMOGLOBIN: 32.5 pg (ref 26.0–34.0)
MEAN CORPUSCULAR VOLUME: 97.3 fL (ref 80.0–100.0)
MEAN PLATELET VOLUME: 7.8 fL (ref 7.0–10.0)
MONOCYTES ABSOLUTE COUNT: 0.2 10*9/L (ref 0.2–0.8)
NEUTROPHILS ABSOLUTE COUNT: 2.1 10*9/L (ref 2.0–7.5)
NEUTROPHILS RELATIVE PERCENT: 57.9 %
PLATELET COUNT: 281 10*9/L (ref 150–440)
RED BLOOD CELL COUNT: 3.9 10*12/L — ABNORMAL LOW (ref 4.00–5.20)
RED CELL DISTRIBUTION WIDTH: 13.3 % (ref 12.0–15.0)
WBC ADJUSTED: 3.6 10*9/L — ABNORMAL LOW (ref 4.5–11.0)

## 2019-09-11 LAB — COMPREHENSIVE METABOLIC PANEL
ALBUMIN: 4.2 g/dL (ref 3.4–5.0)
ALKALINE PHOSPHATASE: 90 U/L (ref 46–116)
ALT (SGPT): 14 U/L (ref 10–49)
ANION GAP: 5 mmol/L (ref 5–14)
AST (SGOT): 18 U/L (ref <=34)
BILIRUBIN TOTAL: 0.4 mg/dL (ref 0.3–1.2)
BLOOD UREA NITROGEN: 10 mg/dL (ref 9–23)
BUN / CREAT RATIO: 11
CALCIUM: 10.2 mg/dL (ref 8.7–10.4)
CHLORIDE: 104 mmol/L (ref 98–107)
CO2: 30 mmol/L (ref 20.0–31.0)
CREATININE: 0.88 mg/dL — ABNORMAL HIGH
EGFR CKD-EPI AA FEMALE: 79 mL/min/{1.73_m2} (ref >=60)
EGFR CKD-EPI NON-AA FEMALE: 69 mL/min/{1.73_m2} (ref >=60)
GLUCOSE RANDOM: 105 mg/dL (ref 70–179)
PROTEIN TOTAL: 7.1 g/dL (ref 5.7–8.2)

## 2019-09-11 LAB — BASOPHILS ABSOLUTE COUNT: Basophils:NCnc:Pt:Bld:Qn:Automated count: 0

## 2019-09-11 LAB — CO2: Carbon dioxide:SCnc:Pt:Ser/Plas:Qn:: 30

## 2019-09-11 NOTE — Unmapped (Signed)
BMT Clinic Follow-up    Patient Name: Christine [name]  MRN: [id number]  Encounter date: 09/11/2019    Primary Care Provider: Allegra Grana, NP  BMT Attending MD:  Lanae Boast, MD.   ??  HPI: Christine [name] is a 64 y.o. 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 3 yr 6 mo after transplant. Tacrolimus stop date 06/15/16.    ??  Interval History:  Ms. Donaghy presents today with her husband. She is feeling well today. Denies interim infections. Denies fever, chills, n/v/d. She if off all medications. She continues to be able to perform all activities without any limitations.  Both she and her husband received two doses of COVID-19 vaccine. They are both planning to receive a booster when available.     Oncology History Overview Note   Referring/Local Oncologist: none    Diagnosis: de novo AML with mutated FLT3    Genetics:   Karyotype/FISH: 47XX +11     Molecular Genetics: FLT3 ITD present; IDH2: c.419G>A [p.Arg140Gln]  ??  No variants detected in ASXL1, BCOR, CEBPA, DNMT3A, ETV6/TEL, EZH2, FLT3, IDH1, KIT, NPM1, NRAS, RUNX1, SF3B1, SRSF2, STAG2, TET2, TP53, U2AF1, WT1, ZRSR2    Pertinent Phenotypic data: blasts expressed CD4, CD11b, CD11c, CD33, CD34, CD64, CD117, and HLA-DR and lack expression of other informative analyzed markers including B and T-cell antigens.     Disease-specific prognostic estimation: ELN Adverse (per FLT3 allelic ratio high per estimate by molecular pathologist)       Acute myeloid leukemia (CMS-HCC)   08/29/2015 Initial Diagnosis    Acute myeloid leukemia (RAF-HCC)     09/02/2015 -  Chemotherapy    Induction therapy with daunorubicin 60 mg/m2 IV daily x 3, cytarabine 200 mg/m2/d CIV x 7 days, midostaurin 50 mg PO BID days 8-21     09/22/2015 Biopsy    50% cellular marrow with 50% blasts     09/25/2015 -  Chemotherapy    CLAG, cladribine 5 mg/m2/day days 1-5, cytarabine 2 grams/m2 IV days 1-5, GCSF days 0-5     10/23/2015 Remission    CR     11/03/2015 -  Chemotherapy    Cytarabine 1.5 g/m2 IV Q12 hrs days 1, 3, 5     11/04/2015 Adverse Reaction    Cardiomyopathy     11/28/2015 Adverse Reaction    Stenotrophomonas maltophilia CVAD infection     12/19/2015 -  Chemotherapy    Cytarabine 1.5 g/m2 IV Q12 hrs days 1, 3, 5     02/24/2016 Transplant    CTN 1301 (control arm) conditioning regimen with Busulfan and Fludarabine followed by matched sibling allo, received bone marrow product with cell dose of 3.18 x 10^6.        Patient Active Problem List   Diagnosis   ??? Acute deep vein thrombosis (DVT) of left lower extremity (CMS-HCC)   ??? Osteoporosis (RAF-HCC)   ??? Acute myeloid leukemia (CMS-HCC)   ??? Heart failure (CMS-HCC)   ??? 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 LLE distal DVT  Chronic PE on 09/10/15 CTA   ??? Allogeneic stem cell transplant (RAF-HCC)   ??? Hypomagnesemia   ??? Immunocompromised state (CMS-HCC)   ??? Pancytopenia (CMS-HCC)   ??? Immunization due   ??? Pneumonia of both lower lobes due to infectious organism   ??? 11/08/2017: Lap appendectomy fo Acute perforated appendicitis     Review of Systems:  A  full system review was performed and was negative except as noted in the above interval history.    **Reviewed and updated past medical, surgical, social, and family history as appropriate.      Allergies   Allergen Reactions   ??? Dapsone      Relative G6PD deficiency (lvl=5.1, lower limit of normal is 5.3)   ??? Penicillins Rash and Hives   ??? Rasburicase      Relative G6PD deficiency (lvl=5.1, lower limit of normal is 5.3)   ??? Sulfasalazine Hives   ??? Vancomycin Other (See Comments)     Avoid if possible, but if needed: Red man syndrome- slow rate over 2 hours, benadryl and ranitidine premed   ??? Adhesive Rash     Uses mepore     ??? Amoxicillin Rash   ??? Cefepime Rash     Can trial again while taking with antihistamine medications/antihistamine premeds    06/10/17: Has successfully tolerated cefepime with benadryl 25 PO as premed    ??? Morphine Rash     Can trial again while taking with antihistamine medications/antihistamine premeds (fentanyl may be better alternative if possible)   ??? Tapentadol Rash     Uses mepore     Current Outpatient Medications   Medication Sig Dispense Refill   ??? calcium carbonate-vitamin D3 600 mg(1,500mg ) -800 unit Tab Take 1 tablet by mouth two (2) times a day. 60 tablet 5     No current facility-administered medications for this visit.     Facility-Administered Medications Ordered in Other Visits   Medication Dose Route Frequency Provider Last Rate Last Admin   ??? flu vacc qs2019-20 6mos up(PF) (FLUARIX, FLULAVAL, FLUZONE) 60 mcg (15 mcg x 4)/0.5 mL injection            ??? varicella-zoster gE-AS01B (PF) (SHINGRIX) 50 mcg/0.5 mL injection              Physical Exam:  BP 108/83  - Pulse 81  - Temp 37.2 ??C (98.9 ??F) (Temporal)  - Resp 16  - Ht 162.6 cm (5' 4)  - Wt 66 kg (145 lb 6.4 oz)  - LMP  (LMP Unknown)  - SpO2 98%  - BMI 24.96 kg/m??   General : Well appearing, in no acute distress.   Head: Normocephalic, atraumatic.     ENT: Anicteric, Moist mucous membranes. No lesions or erythema.  Cardiovascular: Pulse normal rate, regularity and rhythm. S1 and S2 normal.  Lungs: Clear to auscultation bilaterally, without wheezes/crackles/rhonchi. Good air movement.   Skin: Warm, dry, intact. No rash noted.   Psychiatry: Alert and oriented to person, place, and time.   Abdomen : Normoactive bowel sounds, abdomen soft, non-tender   Extremeties: No edema.   Musculo Skeletal: Full range of motion in shoulder, elbow, hip knee, ankle, left hand and feet.  Neurologic: CNII-XII grossly intact. Normal strength and sensation throughout    Test Results:   Lab Results   Component Value Date    WBC 3.6 (L) 09/11/2019    HGB 12.7 09/11/2019    HCT 37.9 09/11/2019    PLT 281 09/11/2019       Lab Results   Component Value Date    NA 139 09/11/2019    K 3.9 09/11/2019    CL 104 09/11/2019    CO2 30.0 09/11/2019 BUN 10 09/11/2019    CREATININE 0.88 (H) 09/11/2019    GLU 105 09/11/2019    CALCIUM 10.2 09/11/2019    MG 2.1 11/24/2017  PHOS 4.0 11/24/2017       Lab Results   Component Value Date    BILITOT 0.4 09/11/2019    BILIDIR <0.10 04/15/2016    PROT 7.1 09/11/2019    ALBUMIN 4.2 09/11/2019    ALT 14 09/11/2019    AST 18 09/11/2019    ALKPHOS 90 09/11/2019    GGT 115 (H) 08/10/2016       Lab Results   Component Value Date    INR 1.06 11/24/2017    APTT 24.3 (L) 04/15/2016     Assessment/Plan:  **AML:??(FLT3-ITD, +11, and IDH2)??in CR prior to transplant.   06/22/16: BMBx:   - ??Normocellular bone marrow (40%) with TLH and 3% blasts.  - ??Routine cytogenetics: donor karyotype; FISH (trisomy 11) results are normal.  - ??Flow cytometric MRD results reveal no evidence of aberrant myeloid antigen expression or abnormal myeloblasts.  - ??DNA Chimerism see below.  - Considered??maintenance with FLT3 inhibitor??(sorafenib). Was reluctant to start around D+45-60 due to pancytopenia and changing chimerism. Decided against it. 10 months after transplant maintenance was discussed again - decided against it.     03/15/18: BMBx (2 years):  -  Normocellular bone marrow (40%) with TLH and 1% blasts.  - ??Routine cytogenetics: donor karyotype; FISH (trisomy 11) results are normal.  - ??Flow cytometric MRD results reveal no evidence of aberrant myeloid antigen expression or abnormal myeloblasts.  - ??DNA Chimerism see below.   ??  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 ??  Conditioning:??  ????????????????????????Busulfan PK calculated from test dose 80 mg/hr daily and Fludarabine 40 mg/m2 daily day -5 thru day -2.  ????????????????????????Day 0 -??02/24/16  Cell dose:??3.24 x10^8 TNC (Marrow)  Chimerisms/Marrow:  03/31/16: Day 30 marrow normocellular w/1% blasts. FISH and cytos are normal. MRD negative.   BM Chimerisms show 75% donor CD3 and >95% donor in unfractionated compartment.   04/22/16: PB Chimerisms show 71% donor CD3 and >95% donor unfractionated.   05/05/16: BM Chimerisms show 65% donor CD3 and >95% donor unfractionated.   05/18/16: PB Chimerisms show 74% donor CD3 and 95% donor unfractionated.   06/01/16: PB Chimerisms show 69% donor CD3 and >95% donor unfractionated.  06/22/16: PB Chimerisms show 80% donor CD3 and >95% donor unfractionated  06/22/16: BM Chimerisms show 79% donor CD3 and >95% donor unfractionated.   07/06/16: PB Chimerisms show 77% donor CD3 and >95% donor unfractionated.  07/13/16: Off tac x 4 weeks.??Chimerism check next visit.  If no full chimerism at 8-10 weeks after discontinuation of tac will consider DLI. We do not have any donor cells stored. If we need to recollect for DLI, will consider mobilizing the donor and collecting CD3+ cells for DLI as well as CD34+ cells for another transplant.   07/26/16: Off tac almost 6 weeks. PB Chimerisms show 74% donor in CD3 and >95% donor unfractionated.   08/10/16: Off tac 8 weeks. Repeat DNA shows slight increase in CD3 to 79%. Plan after discussion with Dr. Oswald Hillock to re-collect sister for more donor cells for DLI. However, today with??elevated LFTs, this is new, will need to follow these and make sure no GVHD prior to contacting sister for further donation. This was discussed with Pam today.   08/18/16: Off tac 9 weeks.??PB Chimerisms show??85% donor CD3 and >95% donor unfractionated.  08/24/16: Off tac 10 weeks. BM Chimerisms show 79% donor CD3 and >95% donor unfractionated.  09/07/16: Off tac 12 weeks. PB Chimerisms show 91% donor CD3  and >95% donor unfractionated.??  09/28/16: PB chimerism 94% donor CD3 and >95% donor unfractionated.  10/26/16: PB chimerism 90% donor CD3 and >95% donor unfractionated.??  11/23/16: PB chimerism 95% donor CD3 and >95% donor unfractionated.??  12/22/16: PB chimerism 94% donor CD3 and >95% donor unfractionated.??  01/18/17: PB chimerism 93% donor CD3 and >95% donor unfractionated.??  02/15/17: PB chimerism 95% donor CD3 and >95% donor unfractionated.??  03/15/17: PB chimerism 95% donor CD3 and >95% donor unfractionated.??  04/12/17: PB chimerism 95% donor CD3 and >95% donor unfractionated.??  05/17/17: PB chimerism 95% donor CD3 and >95% donor unfractionated.??  06/21/17: PB chimerism >95% donor CD3 and >95% donor unfractionated.??  08/17/17:  PB chimerism >95% donor CD3 and >95% donor unfractionated.??  09/21/17: PB chimerism >95% donor CD3 and >95% donor unfractionated.??  11/30/17: PB chimerism 95% donor CD3 and >95% donor unfractionated.??  12/20/17: PB chimerism 95% donor CD3 and >95% donor unfractionated.??  03/01/18: PB chimerism 95% donor CD3 and >95% donor unfractionated.??  07/04/18: PB chimerism 94% donor CD3 and >95% donor unfractionated.??  07/12/18: PB chimerism >95% donor CD3 and >95% donor unfractionated.??  09/06/18: PB chimerism >95% donor CD3 and >95% donor unfractionated.  12/06/18: Not drawn. CBC WNL.   03/06/19: PB chimerism >95% donor CD3 and >95% donor unfractionated.  09/11/19: Chimerism pending.     GVHD Prophy:??  - Methotrexate on day +1, 3, 6, 11 (received all 4 doses)  - Tac tapered and eventually DC'd 06/15/16????  - ??Has had no e/o GVHD,developed new elevated LFTs noted at visit on 7/31. Liver US(08/18/16): 1.Patent hepatic vasculature with normal flow direction 2.Large simple hepatic cysts.3. Stone in gallbladder.   - 01/18/17: LFTs continue to improve.   - 09/11/19: LFT WNL  ??  Heme:??CBC stable.   Transfusion criteria: * Pre-med with tylenol and Zyrtec pre- blood or platelet transfusion.   - Hgb <8 and Plt <10K or bleeding.   - Granix if ANC <0.5.   - 07/04/18: ANC 1.9. Has been waxing and waning. Will repeat next week.   - 12/06/18: ANC 2.9 and ALC 1.2.  - 03/06/19: ANC 2.1 and ALC 1.0.    - 09/11/19: ANC 2.1 and ALC 1.1.  ??    Hx of DVT:  - Completed 6 mos or anticoagulation.     **ID:??Afeb. No evidence of active infection.     Hx of acute appendicitis and serositis, complicated by abscess. Fully recovered.     Prophylaxis:   - Continue Valtrex for Zoster prophy 500 mg qd. Discontinued after received two doses of Shingrix.   - Bactrim stopped due to pancytopenia. Allergic to dapsone. Continued Pentamidine until 07/12/17.   - Vaccines as below.  ??  Low CD4  - 06/30/16: Off IS. CD4 count 126.   - 09/07/16: CD4 count 137. ??  - 09/28/16: Received Pentamidine today. Plan to check CD4 level at next visit and continue with monthly pentamidine.   - 10/26/16: CD4 count 180. Continue monthly pentamidine.   - 11/23/16: CD4 count 167. Continue monthly pentamidine.   - 12/22/16: Continue monthly pentamidine (today).  - 01/19/16: Pentamidine due today.    - 02/15/17: Repeat CD4 count 188. Continue pentamidine.   - 03/15/17: Received dose today.   - 04/12/17: Received dose today.   - 05/17/17: CD4 count 216. Received pentamidine today. Will discuss restarting Bactrim DS on Sat and Sun.  - 06/21/17: Pentamidine today. After recovery from cold will discuss restarting Bactrim DS on Sat and Sun.  - 07/12/17: Will  DC pentamidine.   - 03/01/18: CD4 count 334.   ??  Viral studies:  - 11/23/16: Viral panels have been negative. Will stop monitoring unless clinically indicated.    - 03/15/17: [facility] Respiratory virus panel positive for rhinovirus.     - 06/08/17: Parainfluenza pneumonia. IgG on 5/30 was 630.   - 07/12/17: Fully recovered.   ??  Vaccine:   - 09/07/16: First set of post-transplant vaccines. Next set due in the end of February - next visit   Will also consider Shingrix - likely after her immunity is a bit better.    - 03/15/17: Delay vaccines due to cold symptoms. Will plan on the next dose of post-transplant vaccines and Shingrix next visit.   - 04/12/17: Today 12 months post transplant vaccines. First dose of Shingrix vaccine. Next set in 10/2017.   - 12/20/17:  Shingrix given today as well as flu shot.   - 02/01/18: Valtrex completed last week. S/P Shingrix X 2. No further Valtrex needed.   - 03/01/18: Completed post transplant vaccines today.  - 12/06/18: Received a Flu shot last month.   - 03/06/19: Received two doses of COVID-19 vaccine.   - 09/11/19: Planning to receive a booster when available. Also planning to receive Flu vaccine when available.      **CV:   Hx of anthracycline induced cardiomyopathy:  - Nov 2017 w/EF 35%. This has now resolved with 01/2016 Echo showing estimated EF of 50%.   - Treated with lisinopril and metoprolol (Metoprolol 50mg  XL).   - Now off all antihypertensives due to intolerance (hypotension).   - 03/15/17: BP WNL. Asymptomatic.   - 04/12/17: Patient reported that her PCP checked her cholesterol and it was borderline high 239. She is wondering if she should take a lipid lowering agents recommended by her PCP. I advised her to discuss this with Dr. Barbette Merino whom she is scheduled to see in a few months.   - 09/11/19: She is planning to see Dr. Barbette Merino later this year - will call to make an appt.     ??  HTN:??Lisinopril stopped d/t AKI.   - 04/07/16: D/C'd Norvasc. Due to asymptomatic hypotension. Pre-transplant blood pressures run in the 100/70-80 range per her report.   Per Dr. Barbette Merino, ok to stop metoprolol given low BPs with plan to repeat Echo in 6-8 weeks.  - 07/06/16: Baseline ECHO today showed EF of 45-50%. DC metoprolol.   - 07/13/16: Follow BP. Will repeat ECHO 8-12 weeks after stopping metoprolol. Repeat ECHO scheduled for 10/26/2016.  - 10/26/16: Repeat ECHO showed normal EF. Ms. Cherian saw Ramond Marrow who decided not to restart ACE inhibitor or beta blocker. Recommended annual follow up with ECHO of the heart.    -03/01/18: Scheduled with Dr. Barbette Merino-  delayed due to COVID19.   - 07/04/18: Needs to reschedule follow up with Ramond Marrow.   - 12/06/18: Rinaldo Cloud saw Ramond Marrow last week.   - 09/11/19: As above.     **GI/Hepatic:??  - Completed VOD prophylaxis.   - Elevated LFTs:??Mild elevation at visit on 07/26/16. 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   ---??08/10/16 AST and ALT now 107/170 respectively. Normal amylase and lipase. No abdominal pain or GI symptoms. ---??08/13/16 ????AST 191 and ALT 283. Alk phos 177.  ---??08/18/16 ??AST 144 and ALT 264. Alk phos 218. Liver US: with patent hepatic vasculature notable for large simple hepatic cyst and gallstones.  ---??09/07/16: AST 136 and ALT 242. Alk phos 197. Stable.   ---??09/28/16: AST 91 and ALT 159.  Alk phos 128. LFTs continue to improve.   --- 10/26/16: AST 84 and ALT 139. Alk phos 114. LFTs continue to improve.   --- 11/23/16:  AST 64 and ALT 102. Alk phos 96. LFTs continue to improve.   --- 12/22/16:  AST 51 and ALT 72. Alk phos 79. LFTs continue to improve.   --- 01/18/17: AST 42 and ALT 53. Alk phos 75. LFTs continue to improve.    --- 02/15/17: AST 47 and ALT 53. Alk phos 71. Stable.   --- 06/21/17 - present: LFT WNL.   ??  **Bone Health:  - 03/31/16: DEXA scan showed osteoporosis of the spine and osteopenia of the femoral neck. Zometa and Ca/Vit D.   - 04/02/16: Received 1st dose of Zometa.   - 07/06/16. 2nd dose of Zometa. Completed. Continue Ca/Vit D supplement.   - 03/15/17: Patient received two doses of Zometa. Continue Ca/Vit D supplement. Vit D 38.9 WNL.   - 07/04/18: Will schedule Dexa scan and Zometa. Will coordinate with next visit.  - 09/06/18: Dexa scan indicative of low bone density.   - 12/06/18: Scheduled for Zometa. Complicated by significant extravasation.   - 03/06/19: Received Reclast.   - 09/11/19: Plan to repeat DEXA scan early next year to have results before determining whether or not she needs 3rd dose of Reclast. Ordered. In the meantime will continue Calcium/vit D supplementation.   ??  Plan:   - 3.5 yr. Clinically doing well. CBC stable. At this point relapse exteremly unlikely.    - Patient has osteoporosis with diagnosis preceding allo-SCT. Repeat dexa scan indicative of low bone density received second dose of Reclast. Further plan as above.   - Follow up with Ramond Marrow for cardiomyopathy.   - Following for all health maintenance problems with her PCP.   - Vaccines: planning to receive COVID-19 booster and Flu vaccines when available.       Willaim Bane, MD  [facility]

## 2019-09-13 ENCOUNTER — Ambulatory Visit (INDEPENDENT_AMBULATORY_CARE_PROVIDER_SITE_OTHER): Payer: PPO

## 2019-09-13 VITALS — Ht 64.0 in | Wt 145.0 lb

## 2019-09-13 DIAGNOSIS — Z0000 Encounter for general adult medical examination without abnormal findings: Secondary | ICD-10-CM

## 2019-09-13 NOTE — Patient Instructions (Addendum)
Robyn Montgomery , Thank you for taking time to come for your Medicare Wellness Visit. I appreciate your ongoing commitment to your health goals. Please review the following plan we discussed and let me know if I can assist you in the future.   These are the goals we discussed: Goals    . Follow up with Primary Care Provider     As needed       This is a list of the screening recommended for you and due dates:  Health Maintenance  Topic Date Due  . Urine Protein Check  Never done  . Flu Shot  04/10/2020*  . Mammogram  08/14/2020  . Pneumonia vaccines (2 of 2 - PPSV23) 03/02/2023  . Colon Cancer Screening  11/16/2023  . Tetanus Vaccine  08/12/2026  . DEXA scan (bone density measurement)  Completed  . COVID-19 Vaccine  Completed  .  Hepatitis C: One time screening is recommended by Center for Disease Control  (CDC) for  adults born from [birth year] through 1967.   Completed  *Topic was postponed. The date shown is not the original due date.   Advanced directives: End of life planning; Advance aging; Advanced directives discussed.  Copy of current HCPOA/Living Will requested.    Conditions/risks identified: none new  Follow up in one year for your annual wellness visit.   Preventive Care 66 Years and Older, Female Preventive care refers to lifestyle choices and visits with your health care provider that can promote health and wellness. What does preventive care include?  A yearly physical exam. This is also called an annual well check.  Dental exams once or twice a year.  Routine eye exams. Ask your health care provider how often you should have your eyes checked.  Personal lifestyle choices, including:  Daily care of your teeth and gums.  Regular physical activity.  Eating a healthy diet.  Avoiding tobacco and drug use.  Limiting alcohol use.  Practicing safe sex.  Taking low-dose aspirin every day.  Taking vitamin and mineral supplements as recommended by your health care  provider. What happens during an annual well check? The services and screenings done by your health care provider during your annual well check will depend on your age, overall health, lifestyle risk factors, and family history of disease. Counseling  Your health care provider may ask you questions about your:  Alcohol use.  Tobacco use.  Drug use.  Emotional well-being.  Home and relationship well-being.  Sexual activity.  Eating habits.  History of falls.  Memory and ability to understand (cognition).  Work and work Statistician.  Reproductive health. Screening  You may have the following tests or measurements:  Height, weight, and BMI.  Blood pressure.  Lipid and cholesterol levels. These may be checked every 5 years, or more frequently if you are over 33 years old.  Skin check.  Lung cancer screening. You may have this screening every year starting at age 66 if you have a 30-pack-year history of smoking and currently smoke or have quit within the past 15 years.  Fecal occult blood test (FOBT) of the stool. You may have this test every year starting at age 66.  Flexible sigmoidoscopy or colonoscopy. You may have a sigmoidoscopy every 5 years or a colonoscopy every 10 years starting at age 66.  Hepatitis C blood test.  Hepatitis B blood test.  Sexually transmitted disease (STD) testing.  Diabetes screening. This is done by checking your blood sugar (glucose) after you have not  eaten for a while (fasting). You may have this done every 1-3 years.  Bone density scan. This is done to screen for osteoporosis. You may have this done starting at age 66.  Mammogram. This may be done every 1-2 years. Talk to your health care provider about how often you should have regular mammograms. Talk with your health care provider about your test results, treatment options, and if necessary, the need for more tests. Vaccines  Your health care provider may recommend certain  vaccines, such as:  Influenza vaccine. This is recommended every year.  Tetanus, diphtheria, and acellular pertussis (Tdap, Td) vaccine. You may need a Td booster every 10 years.  Zoster vaccine. You may need this after age 66.  Pneumococcal 13-valent conjugate (PCV13) vaccine. One dose is recommended after age 66.  Pneumococcal polysaccharide (PPSV23) vaccine. One dose is recommended after age 66. Talk to your health care provider about which screenings and vaccines you need and how often you need them. This information is not intended to replace advice given to you by your health care provider. Make sure you discuss any questions you have with your health care provider. Document Released: 01/24/2015 Document Revised: 09/17/2015 Document Reviewed: 10/29/2014 Elsevier Interactive Patient Education  2017 [place] Prevention in the Home Falls can cause injuries. They can happen to people of all ages. There are many things you can do to make your home safe and to help prevent falls. What can I do on the outside of my home?  Regularly fix the edges of walkways and driveways and fix any cracks.  Remove anything that might make you trip as you walk through a door, such as a raised step or threshold.  Trim any bushes or trees on the path to your home.  Use bright outdoor lighting.  Clear any walking paths of anything that might make someone trip, such as rocks or tools.  Regularly check to see if handrails are loose or broken. Make sure that both sides of any steps have handrails.  Any raised decks and porches should have guardrails on the edges.  Have any leaves, snow, or ice cleared regularly.  Use sand or salt on walking paths during winter.  Clean up any spills in your garage right away. This includes oil or grease spills. What can I do in the bathroom?  Use night lights.  Install grab bars by the toilet and in the tub and shower. Do not use towel bars as grab  bars.  Use non-skid mats or decals in the tub or shower.  If you need to sit down in the shower, use a plastic, non-slip stool.  Keep the floor dry. Clean up any water that spills on the floor as soon as it happens.  Remove soap buildup in the tub or shower regularly.  Attach bath mats securely with double-sided non-slip rug tape.  Do not have throw rugs and other things on the floor that can make you trip. What can I do in the bedroom?  Use night lights.  Make sure that you have a light by your bed that is easy to reach.  Do not use any sheets or blankets that are too big for your bed. They should not hang down onto the floor.  Have a firm chair that has side arms. You can use this for support while you get dressed.  Do not have throw rugs and other things on the floor that can make you trip. What can I  do in the kitchen?  Clean up any spills right away.  Avoid walking on wet floors.  Keep items that you use a lot in easy-to-reach places.  If you need to reach something above you, use a strong step stool that has a grab bar.  Keep electrical cords out of the way.  Do not use floor polish or wax that makes floors slippery. If you must use wax, use non-skid floor wax.  Do not have throw rugs and other things on the floor that can make you trip. What can I do with my stairs?  Do not leave any items on the stairs.  Make sure that there are handrails on both sides of the stairs and use them. Fix handrails that are broken or loose. Make sure that handrails are as long as the stairways.  Check any carpeting to make sure that it is firmly attached to the stairs. Fix any carpet that is loose or worn.  Avoid having throw rugs at the top or bottom of the stairs. If you do have throw rugs, attach them to the floor with carpet tape.  Make sure that you have a light switch at the top of the stairs and the bottom of the stairs. If you do not have them, ask someone to add them for  you. What else can I do to help prevent falls?  Wear shoes that:  Do not have high heels.  Have rubber bottoms.  Are comfortable and fit you well.  Are closed at the toe. Do not wear sandals.  If you use a stepladder:  Make sure that it is fully opened. Do not climb a closed stepladder.  Make sure that both sides of the stepladder are locked into place.  Ask someone to hold it for you, if possible.  Clearly mark and make sure that you can see:  Any grab bars or handrails.  First and last steps.  Where the edge of each step is.  Use tools that help you move around (mobility aids) if they are needed. These include:  Canes.  Walkers.  Scooters.  Crutches.  Turn on the lights when you go into a dark area. Replace any light bulbs as soon as they burn out.  Set up your furniture so you have a clear path. Avoid moving your furniture around.  If any of your floors are uneven, fix them.  If there are any pets around you, be aware of where they are.  Review your medicines with your doctor. Some medicines can make you feel dizzy. This can increase your chance of falling. Ask your doctor what other things that you can do to help prevent falls. This information is not intended to replace advice given to you by your health care provider. Make sure you discuss any questions you have with your health care provider. Document Released: 10/24/2008 Document Revised: 06/05/2015 Document Reviewed: 02/01/2014 Elsevier Interactive Patient Education  2017 [employer].

## 2019-09-13 NOTE — Progress Notes (Addendum)
Subjective:   Robyn Montgomery is a 64 y.o. female who presents for an Initial Medicare Annual Wellness Visit.  Review of Systems    No ROS.  Medicare Wellness Virtual Visit.     Cardiac Risk Factors include: advanced age (>29mn, >>60women)     Objective:    Today's Vitals   09/13/19 0934  Weight: 145 lb (65.8 kg)  Height: 5' 4" (1.626 m)   Body mass index is 24.89 kg/m.  Advanced Directives 09/13/2019 01/30/2018 08/26/2015 08/26/2015  Does Patient Have a Medical Advance Directive? Yes Yes Yes Yes  Type of [place]aramed[place]of [place] will Healthcare Power of [place] Does patient want to make changes to medical advance directive? No - Patient declined - - No - Patient declined  Copy of [place] Chart? No - copy requested - No - copy requested No - copy requested    Current Medications (verified) Outpatient Encounter Medications as of 09/13/2019  Medication Sig   ascorbic acid (VITAMIN C) 500 MG tablet Take by mouth.   Calcium Carb-Cholecalciferol 600-800 MG-UNIT TABS Take by mouth.   mupirocin ointment (BACTROBAN) 2 % Apply 1 application topically daily. Qd to excision site with dressing changes   No facility-administered encounter medications on file as of 09/13/2019.    Allergies (verified) Dapsone, Rasburicase, Sulfasalazine, Penicillins, Sulfa antibiotics, Vancomycin, Cefepime, Morphine, Tape, and Tapentadol   History: Past Medical History:  Diagnosis Date   Basal cell carcinoma 01/20/2006   left upper back paraspinal   Basal cell carcinoma 06/22/2012   right sup med scapula, right med breast   Basal cell carcinoma 10/04/2013   right side near costal area, right chest   Basal cell carcinoma 10/07/2014   left mid back 2.0 cm lat to spine above braline   Basal cell carcinoma 05/26/2015   right sup nasolabial   Basal cell carcinoma  06/23/2016   left midline forehead   Basal cell carcinoma 12/13/2016   left sup chest, right lat elbow, right prox med calf   Basal cell carcinoma 06/13/2017   left preauricular, left medial scapula   Basal cell carcinoma 06/19/2018   left ant lat thigh, right mid pretibial   Basal cell carcinoma 12/25/2018   right sup med scapula   Basal cell carcinoma 06/28/2019   Upper back spinal   Basal cell carcinoma 06/28/2019   R forehead   Cancer ([place]    leukemia- post stem cell transplant   Dysplastic nevi 08/14/2019   L mid posterior thigh   Dysplastic nevus 01/16/2007   left upper gastric   Dysplastic nevus 10/04/2013   right UQA, right sacral lower back   Dysplastic nevus 06/23/2016   right prox tricep   Leukemia ([place]    Osteoporosis    Personal history of chemotherapy    Past Surgical History:  Procedure Laterality Date   BREAST BIOPSY Left    mole removed above left nipple, no bx   broken shoulder Right 2012   [place]  [place] from [place]   SHOULDER SURGERY     Family History  Problem Relation Age of Onset   Arthritis Mother    Hypertension Mother    Cancer Mother [phone]      Breast   Osteoporosis Mother    Breast cancer Mother [phone]  Parkinsonism Father    Heart disease Maternal Uncle    Cancer Paternal Grandfather        Oral   Social History   Socioeconomic History   Marital status: Married    Spouse name: Not on file   Number of children: Not on file   Years of education: Not on file   Highest education level: Not on file  Occupational History   Not on file  Tobacco Use   Smoking status: Never Smoker   Smokeless tobacco: Never Used  Vaping Use   Vaping Use: Never used  Substance and Sexual Activity   Alcohol use: Not Currently    Alcohol/week: 0.0 standard drinks    Comment: Rare   Drug use: No   Sexual activity: Yes    Partners:  Male    Comment: Husband   Other Topics Concern   Not on file  Social History Narrative   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    married   No children    No pets    Right handed    Caffeine- Rare    Enjoys her new home, exercising, and cooking    Social Determinants of Health   Financial Resource Strain: Low Risk    Difficulty of Paying Living Expenses: Not hard at all  Food Insecurity: No Food Insecurity   Worried About Charity fundraiser in the Last Year: Never true   Arboriculturist in the Last Year: Never true  Transportation Needs: No Transportation Needs   Lack of Transportation (Medical): No   Lack of Transportation (Non-Medical): No  Physical Activity: Sufficiently Active   Days of Exercise per Week: 5 days   Minutes of Exercise per Session: 60 min  Stress: No Stress Concern Present   Feeling of Stress : Not at all  Social Connections:    Frequency of Communication with Friends and Family: Not on file   Frequency of Social Gatherings with Friends and Family: Not on file   Attends Religious Services: Not on Electrical engineer or Organizations: Not on file   Attends Archivist Meetings: Not on file   Marital Status: Not on file    Tobacco Counseling Counseling given: Not Answered   Clinical Intake:  Pre-visit preparation completed: Yes        Diabetes: No  How often do you need to have someone help you when you read instructions, pamphlets, or other written materials from your doctor or pharmacy?: 1 - Never    Interpreter Needed?: No      Activities of Daily Living In your present state of health, do you have any difficulty performing the following activities: 09/13/2019  Hearing? N  Vision? N  Difficulty concentrating or making decisions? N  Walking or climbing stairs? N  Dressing or bathing? N  Doing errands, shopping? N  Preparing Food and eating ? N  Using the Toilet? N  In the past six months,  have you accidently leaked urine? N  Do you have problems with loss of bowel control? N  Managing your Medications? N  Managing your Finances? N  Housekeeping or managing your Housekeeping? N  Some recent data might be hidden    Patient Care Team: Burnard Hawthorne, FNP as PCP - General (Family Medicine)  Indicate any recent Medical Services you may have received from other than [facility] providers in the past year (date may be approximate).  Assessment:   This is a routine wellness examination for Robyn Montgomery.  I connected with Ernest today by telephone and verified that I am speaking with the correct person using two identifiers. Location patient: home Location provider: work Persons participating in the virtual visit: patient, Marine scientist.    I discussed the limitations, risks, security and privacy concerns of performing an evaluation and management service by telephone and the availability of in person appointments. The patient expressed understanding and verbally consented to this telephonic visit.    Interactive audio and video telecommunications were attempted between this provider and patient, however failed, due to patient having technical difficulties OR patient did not have access to video capability.  We continued and completed visit with audio only.  Some vital signs may be absent or patient reported.   Hearing/Vision screen  Hearing Screening   125Hz 250Hz 500Hz 1000Hz 2000Hz 3000Hz 4000Hz 6000Hz 8000Hz  Right ear:           Left ear:           Comments: Patient is able to hear conversational tones without difficulty.  No issues reported.  Vision Screening Comments: Visual acuity not assessed, virtual visit.  They have seen their ophthalmologist in the last 12 months.     Dietary issues and exercise activities discussed: Current Exercise Habits: Home exercise routine, Type of exercise: walking;yoga, Time (Minutes): 60, Frequency (Times/Week): 5, Weekly Exercise  (Minutes/Week): 300, Intensity: Mild  Goals     Follow up with Primary Care Provider     As needed      Depression Screen PHQ 2/9 Scores 09/13/2019 07/31/2019 07/26/2018 02/02/2016  PHQ - 2 Score 0 0 0 0  PHQ- 9 Score 0 0 - -    Fall Risk Fall Risk  09/13/2019 07/31/2019 12/06/2018 02/02/2016  Falls in the past year? 0 1 0 No  Comment No falls since last reported - Emmi Telephone Survey: data to providers prior to load -  Number falls in past yr: - 0 - -  Injury with Fall? - 1 - -  Follow up Falls evaluation completed Falls evaluation completed - -   Handrails in use when climbing stairs?  Yes  Home free of loose throw rugs in walkways, pet beds, electrical cords, etc? Yes  Adequate lighting in your home to reduce risk of falls? Yes   ASSISTIVE DEVICES UTILIZED TO PREVENT FALLS: Use of a cane, walker or w/c? No  TIMED UP AND GO:  Was the test performed? No . Virtual visit.  Cognitive Function:  Patient is alert and oriented x3. Denies difficulty with making decisions, memory loss.  Enjoys cross stitch for brain health.       Immunizations Immunization History  Administered Date(s) Administered   DTaP / Hep B / IPV 09/07/2016, 04/12/2017, 03/01/2018   Dtap, Unspecified 05/11/2008   HiB (PRP-T) 09/07/2016, 04/12/2017, 03/01/2018   Influenza,inj,Quad PF,6+ Mos 10/29/2015, 10/26/2016, 12/20/2017   MMR 03/01/2018   PFIZER SARS-COV-2 Vaccination 02/02/2019, 02/22/2019   Pneumococcal Conjugate-13 10/29/2015, 09/07/2016, 04/12/2017   Pneumococcal Polysaccharide-23 03/01/2018   Zoster Recombinat (Shingrix) 04/12/2017, 12/20/2017     Health Maintenance Health Maintenance  Topic Date Due   URINE MICROALBUMIN  Never done   INFLUENZA VACCINE  04/10/2020 (Originally 08/12/2019)   MAMMOGRAM  08/14/2020   PNA vac Low Risk Adult (2 of 2 - PPSV23) 03/02/2023   COLONOSCOPY  11/16/2023   TETANUS/TDAP  08/12/2026   DEXA SCAN  Completed   COVID-19 Vaccine  Completed  Hepatitis C Screening  Completed    Dental Screening: Recommended annual dental exams for proper oral hygiene. Visits every 6 months.   Community Resource Referral / Chronic Care Management: CRR required this visit?  No   CCM required this visit?  No      Plan:   I have personally reviewed and noted the following in the patients chart:    Medical and social history  Use of alcohol, tobacco or illicit drugs   Current medications and supplements  Functional ability and status  Nutritional status  Physical activity  Advanced directives  List of other physicians  Hospitalizations, surgeries, and ER visits in previous 12 months  Vitals  Screenings to include cognitive, depression, and falls  Referrals and appointments  In addition, I have reviewed and discussed with patient certain preventive protocols, quality metrics, and best practice recommendations. A written personalized care plan for preventive services as well as general preventive health recommendations were provided to patient via mychart.     Varney Biles, LPN   07/16/2261     Agree with plan. Mable Paris, NP

## 2019-10-22 DIAGNOSIS — H2513 Age-related nuclear cataract, bilateral: Secondary | ICD-10-CM | POA: Diagnosis not present

## 2019-12-11 ENCOUNTER — Ambulatory Visit
Admit: 2019-12-11 | Discharge: 2019-12-11 | Payer: PRIVATE HEALTH INSURANCE | Attending: Cardiovascular Disease | Primary: Cardiovascular Disease

## 2019-12-11 ENCOUNTER — Ambulatory Visit: Admit: 2019-12-11 | Discharge: 2019-12-11 | Payer: PRIVATE HEALTH INSURANCE

## 2019-12-11 DIAGNOSIS — I427 Cardiomyopathy due to drug and external agent: Principal | ICD-10-CM

## 2019-12-11 DIAGNOSIS — I10 Essential (primary) hypertension: Principal | ICD-10-CM

## 2019-12-11 DIAGNOSIS — C9201 Acute myeloblastic leukemia, in remission: Principal | ICD-10-CM

## 2019-12-11 DIAGNOSIS — Z6825 Body mass index (BMI) 25.0-25.9, adult: Secondary | ICD-10-CM | POA: Diagnosis not present

## 2019-12-11 NOTE — Unmapped (Signed)
Very nice to see you again. As we discussed, your ejection fraction on today's echocardiogram was 50-55%.     Please check your blood pressure a few times per week at home and contact me with the results after a few weeks. The target systolic blood pressure is 110-130 mmHg.    I will plan to see you again in 1 year, but happy to see you sooner if needed.

## 2019-12-12 NOTE — Unmapped (Signed)
Cardio-oncology Clinic Return Patient/Consult Note    Referring Provider: Benita Gutter  [facility]   Primary Provider: Allegra Grana, NP   [address] [facility]  [place] [address]       Reason for Visit:  Christine [name] is a 64 y.o. female who returns for ongoing evaluation and management of cardiomyopathy diagnosed in the setting of treatment for AML.    Assessment & Plan:  1. Cardiomyopathy  The etiology of her cardiomyopathy is not completely certain but given the timing of its onset seems very likely related to receiving daunorubicin. Ischemia could also be considered in a 64 year-old woman, but she has no other risk factors for CAD and no symptoms to suggest ischemia. Her ejection fraction improved steadily and again appears low normal on today's echocardiogram. She did not tolerate neurohormonal antagonists due to symptomatic hypotension. As such, and in light of the fact that she is asymptomatic with excellent functional status, I will not resume ACE inhibitor or beta blocker at this time.    2. Blood pressure  Her blood pressure is marginally elevated in clinic today. I asked her to check her BP a few times a week at home and report the results to me after a few weeks. For SBPs consistently > 130 mmHg, would start lisinopril.    3. Left bundle branch block  Transient during chemotherapy    4. Acute myeloid leukemia  S/p SCT  Now over 3 years post SCT. Ongoing surveillance per [facility] team.    Return to clinic in 12 months for an echo, sooner if needed    History of Present Illness:  Christine [name] returns for ongoing evaluation and management of cardiomyopathy in the setting of treatment for AML. Christine [name] has no history of heart disease of any sort. She always has been very physically active. Prior to diagnosis of AML she regularly walked 3 miles without any chest pain or shortness of breath. She underwent stem cell transplant on February 24, 2016. During treatment for AML she experienced a drop in her ejection fraction that subsequently normalized. Christine [name] is doing extremely well today. Her energy level has returned to normal. She walks 3-3.5 miles and does yoga five days per week. She denies orthopnea, PND, abdominal fullness and LE edema. She denies chest pain, palpitations, presyncope and syncope.              Cardiovascular History & Procedures:  Cath / PCI:  None  CV Surgery:  None  EP Procedures and Devices:  None    Non-Invasive Evaluation(s):  Echo:  08/28/15  ?? Normal left ventricular systolic function, ejection fraction > 55%  ?? Dilated left atrium - mild  ?? Normal right ventricular systolic function  ?? No significant valvular abnormalities  ?? Degenerative mitral valve disease  ?? Mitral regurgitation - mild    11/04/15  ?? Left ventricular hypertrophy - mild  ?? EF estimated 45-50%, with septal dyskinesis likely due to new LBBB, this is a new finding compared to echo from 08/2015  ?? Diastolic dysfunction - grade II (elevated filling pressures)  ?? Degenerative mitral valve disease  ?? Dilated left atrium - mild  ?? Aortic sclerosis  ?? Normal right ventricular systolic function    12/03/15  ?? Limited study to evaluate ventricular function  ?? Dilated left ventricle - mild  ?? Moderately decreased left ventricular systolic function, ejection fraction 35%  ?? Diastolic dysfunction -  grade I (normal filling pressures)  ?? Mitral regurgitation - mild  ?? Normal right ventricular systolic function  ?? Pericardial effusion - small    10/26/16  ?? Limited study to evaluate ventricular function  ?? Borderline left ventricular systolic function  ?? Normal right ventricular systolic function    11/29/17  ?? Limited study to evaluate ventricular function  ?? Normal left ventricular systolic function, ejection fraction 55 to 60%  ?? Diastolic dysfunction - grade I (normal filling pressures)  ?? Normal right ventricular systolic function 11/28/18    1. Limited study to assess ventricular function.    2. Normal left ventricular size and systolic function, ejection fraction 55-60%.    3. Diastolic dysfunction - grade I (normal filling pressures).    4. Normal right ventricular size and systolic function.    CT/MRI/Nuclear Tests:  None       Past Medical History:   Diagnosis Date   ??? 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 AML (acute myelogenous leukemia) (CMS-HCC)    ??? 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 DVT (deep venous thrombosis) (CMS-HCC)    ??? Hypertension    ??? Osteoporosis     Stopped medications due to interactions with chemo drugs      Oncology History Overview Note   Referring/Local Oncologist: none    Diagnosis: de novo AML with mutated FLT3    Genetics:   Karyotype/FISH: 47XX +11     Molecular Genetics: FLT3 ITD present; IDH2: c.419G>A [p.Arg140Gln]  ??  No variants detected in ASXL1, BCOR, CEBPA, DNMT3A, ETV6/TEL, EZH2, FLT3, IDH1, KIT, NPM1, NRAS, RUNX1, SF3B1, SRSF2, STAG2, TET2, TP53, U2AF1, WT1, ZRSR2    Pertinent Phenotypic data: blasts expressed CD4, CD11b, CD11c, CD33, CD34, CD64, CD117, and HLA-DR and lack expression of other informative analyzed markers including B and T-cell antigens.     Disease-specific prognostic estimation: ELN Adverse (per FLT3 allelic ratio high per estimate by molecular pathologist)       Acute myeloid leukemia (CMS-HCC)   08/29/2015 Initial Diagnosis    Acute myeloid leukemia (RAF-HCC)     09/02/2015 -  Chemotherapy    Induction therapy with daunorubicin 60 mg/m2 IV daily x 3, cytarabine 200 mg/m2/d CIV x 7 days, midostaurin 50 mg PO BID days 8-21     09/22/2015 Biopsy    50% cellular marrow with 50% blasts     09/25/2015 -  Chemotherapy    CLAG, cladribine 5 mg/m2/day days 1-5, cytarabine 2 grams/m2 IV days 1-5, GCSF days 0-5     10/23/2015 Remission    CR     11/03/2015 -  Chemotherapy    Cytarabine 1.5 g/m2 IV Q12 hrs days 1, 3, 5     11/04/2015 Adverse Reaction    Cardiomyopathy     11/28/2015 Adverse Reaction    Stenotrophomonas maltophilia CVAD infection     12/19/2015 -  Chemotherapy    Cytarabine 1.5 g/m2 IV Q12 hrs days 1, 3, 5     02/24/2016 Transplant    CTN 1301 (control arm) conditioning regimen with Busulfan and Fludarabine followed by matched sibling allo, received bone marrow product with cell dose of 3.18 x 10^6.            Past Surgical History:  Procedure Laterality Date   ??? IR INSERT PORT AGE GREATER THAN 5 YRS  12/19/2015    IR INSERT PORT AGE GREATER THAN 5 YRS 12/19/2015 Ammie Dalton, MD IMG VIR MM MMNT   ??? PR LAP,APPENDECTOMY N/A 11/08/2017    Procedure: LAPAROSCOPY SURGICAL APPENDECTOMY;  Surgeon: Suella Broad, MD;  Location: MAIN OR [facility];  Service: General Surgery   ??? PR UPPER GI ENDOSCOPY,BIOPSY N/A 05/05/2016    Procedure: UGI ENDOSCOPY; WITH BIOPSY, SINGLE OR MULTIPLE;  Surgeon: Jolyn Lent, MD;  Location: GI PROCEDURES [facility];  Service: Gastroenterology   ??? SHOULDER SURGERY         Allergies:  Dapsone, Penicillins, Rasburicase, Sulfasalazine, Vancomycin, Adhesive, Amoxicillin, Cefepime, Morphine, and Tapentadol    Current Medications:  Current Outpatient Medications   Medication Sig Dispense Refill   ??? ascorbic acid, vitamin C, (VITAMIN C) 500 MG tablet Take by mouth.     ??? calcium carbonate-vitamin D3 600 mg(1,500mg ) -800 unit Tab Take 1 tablet by mouth two (2) times a day. 60 tablet 5     No current facility-administered medications for this visit.     Facility-Administered Medications Ordered in Other Visits   Medication Dose Route Frequency Provider Last Rate Last Admin   ??? flu vacc qs2019-20 6mos up(PF) (FLUARIX, FLULAVAL, FLUZONE) 60 mcg (15 mcg x 4)/0.5 mL injection            ??? varicella-zoster gE-AS01B (PF) (SHINGRIX) 50 mcg/0.5 mL injection                Family History:  There is no family history of premature coronary artery disease or sudden cardiac death.     Social history:  Social History     Socioeconomic History   ??? Marital status: Married     Spouse name: None   ??? Number of children: None   ??? Years of education: None   ??? Highest education level: None   Occupational History   ??? None   Tobacco Use   ??? Smoking status: Never Smoker   ??? Smokeless tobacco: Never Used   Vaping Use   ??? Vaping Use: Never used   Substance and Sexual Activity   ??? Alcohol use: No   ??? Drug use: No   ??? Sexual activity: None   Other Topics Concern   ??? 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      Highest level of education: high school     Social Determinants of Health     Financial Resource Strain: Not on file   Food Insecurity: Not on file   Transportation Needs: Not on file   Physical Activity: Not on file   Stress: Not on file   Social Connections: Not on file       Review of Systems:  A full review of 10 systems is unremarkable except as stated in the HPI.     Physical Exam:  VITAL SIGNS:   Vitals:    12/11/19 1114   BP: 136/78   Pulse: 69   SpO2: 98%      Wt Readings from Last 3 Encounters:   12/11/19 67.4 kg (148 lb 9.6 oz)   09/11/19 66 kg (  145 lb 6.4 oz)   03/06/19 69.2 kg (152 lb 8 oz)      Today's Body mass index is 25.51 kg/m??.   I performed a physical exam today (12/11/19). It was unchanged and is documented accurately.  GENERAL: well-appearing in no acute distress  HEENT: Normocephalic and atraumatic with anicteric sclerae    NECK: Supple, without lymphadenopathy or thyromegaly. JVP flat. There are no carotid bruits  CARDIOVASCULAR: Regular S1S2 without murmur or gallop  RESPIRATORY: Clear to auscultation without wheezes or rales.   ABDOMEN: Soft, non-tender, non-distended with audible bowel sounds. There is no organomegaly or palpable pulsatile mass.   EXTREMITIES:  Lower extremities are warm and without edema. Distal pulses are full and symmetric.   SKIN: No rashes, ecchymosis or petechiae.  NEURO: Alert, pleasant, and appropriate. Non-focal neuro exam    Pertinent Laboratory Studies:   Lab Results   Component Value Date    PRO-BNP 400.0 (H) 06/08/2017    PRO-BNP 1,120.0 (H) 11/04/2015    Creatinine 0.88 (H) 09/11/2019    Creatinine 0.78 03/06/2019    BUN 10 09/11/2019    BUN 10 03/06/2019    Potassium 3.9 09/11/2019    Potassium 4.5 03/06/2019    Potassium, Bld 3.4 06/08/2017    Magnesium 2.1 11/24/2017    Magnesium 2.0 11/13/2017    AST 18 09/11/2019    AST 25 03/06/2019    ALT 14 09/11/2019    ALT 16 03/06/2019    Total Bilirubin 0.4 09/11/2019    Total Bilirubin 0.5 03/06/2019    INR 1.06 11/24/2017    INR 1.22 04/15/2016    WBC 3.6 (L) 09/11/2019    WBC <0.1 (LL) 03/05/2016    HGB 12.7 09/11/2019    Hemoglobin 10.9 (L) 06/08/2017    HCT 37.9 09/11/2019    Platelet 281 09/11/2019       Other pertinent records were reviewed.    Pertinent Test Results from Today:  Echo (prelim, my interpretation): EF 50-55%

## 2019-12-13 ENCOUNTER — Encounter: Payer: Self-pay | Admitting: Dermatology

## 2019-12-13 ENCOUNTER — Ambulatory Visit (INDEPENDENT_AMBULATORY_CARE_PROVIDER_SITE_OTHER): Payer: PPO | Admitting: Dermatology

## 2019-12-13 ENCOUNTER — Other Ambulatory Visit: Payer: Self-pay

## 2019-12-13 DIAGNOSIS — L820 Inflamed seborrheic keratosis: Secondary | ICD-10-CM

## 2019-12-13 DIAGNOSIS — C44519 Basal cell carcinoma of skin of other part of trunk: Secondary | ICD-10-CM

## 2019-12-13 DIAGNOSIS — Z1283 Encounter for screening for malignant neoplasm of skin: Secondary | ICD-10-CM | POA: Diagnosis not present

## 2019-12-13 DIAGNOSIS — Z86018 Personal history of other benign neoplasm: Secondary | ICD-10-CM

## 2019-12-13 DIAGNOSIS — L814 Other melanin hyperpigmentation: Secondary | ICD-10-CM | POA: Diagnosis not present

## 2019-12-13 DIAGNOSIS — L578 Other skin changes due to chronic exposure to nonionizing radiation: Secondary | ICD-10-CM

## 2019-12-13 DIAGNOSIS — D225 Melanocytic nevi of trunk: Secondary | ICD-10-CM

## 2019-12-13 DIAGNOSIS — D229 Melanocytic nevi, unspecified: Secondary | ICD-10-CM | POA: Diagnosis not present

## 2019-12-13 DIAGNOSIS — L821 Other seborrheic keratosis: Secondary | ICD-10-CM

## 2019-12-13 DIAGNOSIS — Z85828 Personal history of other malignant neoplasm of skin: Secondary | ICD-10-CM | POA: Diagnosis not present

## 2019-12-13 DIAGNOSIS — D485 Neoplasm of uncertain behavior of skin: Secondary | ICD-10-CM

## 2019-12-13 DIAGNOSIS — D1800 Hemangioma unspecified site: Secondary | ICD-10-CM

## 2019-12-13 NOTE — Patient Instructions (Signed)

## 2019-12-13 NOTE — Progress Notes (Signed)
Follow-Up Visit   Subjective  Robyn Montgomery is a 64 y.o. female who presents for the following: Annual Exam (Hx BCC, dysplastic nevi ). The patient presents for Total-Body Skin Exam (TBSE) for skin cancer screening and mole check.  The following portions of the chart were reviewed this encounter and updated as appropriate:  Tobacco  Allergies  Meds  Problems  Med Hx  Surg Hx  Fam Hx     Review of Systems:  No other skin or systemic complaints except as noted in HPI or Assessment and Plan.  Objective  Well appearing patient in no apparent distress; mood and affect are within normal limits.  A full examination was performed including scalp, head, eyes, ears, nose, lips, neck, chest, axillae, abdomen, back, buttocks, bilateral upper extremities, bilateral lower extremities, hands, feet, fingers, toes, fingernails, and toenails. All findings within normal limits unless otherwise noted below.  Objective  R lower eyelid: Erythematous keratotic or waxy stuck-on papule or plaque.   Objective  L low back paraspinal: 0.6 cm irregular brown macule.  Objective  L mid parasternal: 1.1 cm pink patch   Assessment & Plan  Inflamed seborrheic keratosis R lower eyelid  Destruction of lesion - R lower eyelid Complexity: simple   Destruction method: cryotherapy   Informed consent: discussed and consent obtained   Timeout:  patient name, date of birth, surgical site, and procedure verified Lesion destroyed using liquid nitrogen: Yes   Region frozen until ice ball extended beyond lesion: Yes   Outcome: patient tolerated procedure well with no complications   Post-procedure details: wound care instructions given    Neoplasm of uncertain behavior of skin (2) L low back paraspinal  Epidermal / dermal shaving  Lesion diameter (cm):  0.6 Informed consent: discussed and consent obtained   Timeout: patient name, date of birth, surgical site, and procedure verified   Procedure prep:   Patient was prepped and draped in usual sterile fashion Prep type:  Isopropyl alcohol Anesthesia: the lesion was anesthetized in a standard fashion   Anesthetic:  1% lidocaine w/ epinephrine 1-100,000 buffered w/ 8.4% NaHCO3 Instrument used: flexible razor blade   Hemostasis achieved with: pressure, aluminum chloride and electrodesiccation   Outcome: patient tolerated procedure well   Post-procedure details: sterile dressing applied and wound care instructions given   Dressing type: bandage and petrolatum    Specimen 1 - Surgical pathology Differential Diagnosis: D48.5 r/o dysplastic nevus vs ISK  Check Margins: No 0.6 cm irregular brown macule.  L mid parasternal  Skin / nail biopsy Type of biopsy: tangential   Informed consent: discussed and consent obtained   Timeout: patient name, date of birth, surgical site, and procedure verified   Procedure prep:  Patient was prepped and draped in usual sterile fashion Prep type:  Isopropyl alcohol Anesthesia: the lesion was anesthetized in a standard fashion   Anesthetic:  1% lidocaine w/ epinephrine 1-100,000 buffered w/ 8.4% NaHCO3 Instrument used: flexible razor blade   Hemostasis achieved with: pressure, aluminum chloride and electrodesiccation   Outcome: patient tolerated procedure well   Post-procedure details: sterile dressing applied and wound care instructions given   Dressing type: bandage and petrolatum    Specimen 2 - Surgical pathology Differential Diagnosis: D48.5 r/o BCC vs other  Check Margins: No 1.1 cm pink patch  Skin cancer screening   Lentigines - Scattered tan macules - Discussed due to sun exposure - Benign, observe - Call for any changes  Seborrheic Keratoses - Stuck-on, waxy, tan-brown papules  and plaques  - Discussed benign etiology and prognosis. - Observe - Call for any changes  Melanocytic Nevi - Tan-brown and/or pink-flesh-colored symmetric macules and papules - Benign appearing on exam  today - Observation - Call clinic for new or changing moles - Recommend daily use of broad spectrum spf 30+ sunscreen to sun-exposed areas.   Hemangiomas - Red papules - Discussed benign nature - Observe - Call for any changes  Actinic Damage - Chronic, secondary to cumulative UV/sun exposure - diffuse scaly erythematous macules with underlying dyspigmentation - Recommend daily broad spectrum sunscreen SPF 30+ to sun-exposed areas, reapply every 2 hours as needed.  - Call for new or changing lesions.  History of Basal Cell Carcinoma of the Skin - No evidence of recurrence today - Recommend regular full body skin exams - Recommend daily broad spectrum sunscreen SPF 30+ to sun-exposed areas, reapply every 2 hours as needed.  - Call if any new or changing lesions are noted between office visits  History of Dysplastic Nevi - No evidence of recurrence today - Recommend regular full body skin exams - Recommend daily broad spectrum sunscreen SPF 30+ to sun-exposed areas, reapply every 2 hours as needed.  - Call if any new or changing lesions are noted between office visits  Skin cancer screening performed today.  Return in about 6 months (around 06/12/2020) for TBSE.  Luther Redo, CMA, am acting as scribe for Sarina Ser, MD .  Documentation: I have reviewed the above documentation for accuracy and completeness, and I agree with the above.  Sarina Ser, MD

## 2019-12-18 ENCOUNTER — Telehealth: Payer: Self-pay

## 2019-12-18 NOTE — Telephone Encounter (Signed)
Discussed biopsy results with pt,  

## 2019-12-19 ENCOUNTER — Encounter: Payer: Self-pay | Admitting: Dermatology

## 2020-01-31 ENCOUNTER — Other Ambulatory Visit: Payer: Self-pay

## 2020-01-31 ENCOUNTER — Ambulatory Visit (INDEPENDENT_AMBULATORY_CARE_PROVIDER_SITE_OTHER): Payer: PPO | Admitting: Dermatology

## 2020-01-31 ENCOUNTER — Encounter: Payer: Self-pay | Admitting: Dermatology

## 2020-01-31 DIAGNOSIS — L578 Other skin changes due to chronic exposure to nonionizing radiation: Secondary | ICD-10-CM | POA: Diagnosis not present

## 2020-01-31 DIAGNOSIS — C44519 Basal cell carcinoma of skin of other part of trunk: Secondary | ICD-10-CM

## 2020-01-31 DIAGNOSIS — C4491 Basal cell carcinoma of skin, unspecified: Secondary | ICD-10-CM

## 2020-01-31 NOTE — Progress Notes (Signed)
   Follow-Up Visit   Subjective  Robyn Montgomery is a 65 y.o. female who presents for the following: Skin Cancer (Pt here for treatment of BCC at left mid parasternal ).  The following portions of the chart were reviewed this encounter and updated as appropriate:   Tobacco  Allergies  Meds  Problems  Med Hx  Surg Hx  Fam Hx     Review of Systems:  No other skin or systemic complaints except as noted in HPI or Assessment and Plan.  Objective  Well appearing patient in no apparent distress; mood and affect are within normal limits.  A focused examination was performed including face,chest,back. Relevant physical exam findings are noted in the Assessment and Plan.  Objective  Left mid parasternal: Pink pearly papule or plaque with arborizing vessels.    Assessment & Plan  Superficial basal cell carcinoma Left mid parasternal  Destruction of lesion Complexity: extensive   Destruction method: electrodesiccation and curettage   Informed consent: discussed and consent obtained   Timeout:  patient name, date of birth, surgical site, and procedure verified Procedure prep:  Patient was prepped and draped in usual sterile fashion Prep type:  Isopropyl alcohol Anesthesia: the lesion was anesthetized in a standard fashion   Anesthetic:  1% lidocaine w/ epinephrine 1-100,000 buffered w/ 8.4% NaHCO3 Curettage performed in three different directions: Yes   Electrodesiccation performed over the curetted area: Yes   Final wound size (cm):  1.5 Hemostasis achieved with:  pressure, aluminum chloride and electrodesiccation Outcome: patient tolerated procedure well with no complications   Post-procedure details: sterile dressing applied and wound care instructions given   Dressing type: bandage and petrolatum    Return for as scheduled June 19 2020.  Actinic Damage - chronic, secondary to cumulative UV radiation exposure/sun exposure over time - diffuse scaly erythematous macules with  underlying dyspigmentation - Recommend daily broad spectrum sunscreen SPF 30+ to sun-exposed areas, reapply every 2 hours as needed.  - Call for new or changing lesions.  IDocumentation: I have reviewed the above documentation for accuracy and completeness, and I agree with the above.  Sarina Ser, MD  I, Marye Round, CMA, am acting as scribe for Sarina Ser, MD .  Documentation: I have reviewed the above documentation for accuracy and completeness, and I agree with the above.  Sarina Ser, MD

## 2020-01-31 NOTE — Patient Instructions (Signed)

## 2020-02-05 ENCOUNTER — Encounter: Payer: Self-pay | Admitting: Dermatology

## 2020-02-21 ENCOUNTER — Ambulatory Visit: Payer: PPO | Admitting: Dermatology

## 2020-03-11 ENCOUNTER — Ambulatory Visit: Admit: 2020-03-11 | Discharge: 2020-03-11 | Payer: PRIVATE HEALTH INSURANCE

## 2020-03-11 ENCOUNTER — Other Ambulatory Visit: Admit: 2020-03-11 | Discharge: 2020-03-11 | Payer: PRIVATE HEALTH INSURANCE

## 2020-03-11 DIAGNOSIS — D849 Immunodeficiency, unspecified: Principal | ICD-10-CM

## 2020-03-11 DIAGNOSIS — Z9484 Stem cells transplant status: Principal | ICD-10-CM

## 2020-03-11 DIAGNOSIS — M810 Age-related osteoporosis without current pathological fracture: Principal | ICD-10-CM

## 2020-03-11 DIAGNOSIS — C9201 Acute myeloblastic leukemia, in remission: Principal | ICD-10-CM

## 2020-03-11 DIAGNOSIS — Z885 Allergy status to narcotic agent status: Secondary | ICD-10-CM | POA: Diagnosis not present

## 2020-03-11 DIAGNOSIS — Z882 Allergy status to sulfonamides status: Secondary | ICD-10-CM | POA: Diagnosis not present

## 2020-03-11 DIAGNOSIS — Z9221 Personal history of antineoplastic chemotherapy: Secondary | ICD-10-CM | POA: Diagnosis not present

## 2020-03-11 DIAGNOSIS — Z880 Allergy status to penicillin: Secondary | ICD-10-CM | POA: Diagnosis not present

## 2020-03-11 DIAGNOSIS — M85852 Other specified disorders of bone density and structure, left thigh: Secondary | ICD-10-CM | POA: Diagnosis not present

## 2020-03-11 DIAGNOSIS — N951 Menopausal and female climacteric states: Secondary | ICD-10-CM | POA: Diagnosis not present

## 2020-03-11 DIAGNOSIS — Z6825 Body mass index (BMI) 25.0-25.9, adult: Secondary | ICD-10-CM | POA: Diagnosis not present

## 2020-03-11 DIAGNOSIS — Z86718 Personal history of other venous thrombosis and embolism: Secondary | ICD-10-CM | POA: Diagnosis not present

## 2020-03-11 DIAGNOSIS — I10 Essential (primary) hypertension: Secondary | ICD-10-CM | POA: Diagnosis not present

## 2020-03-11 DIAGNOSIS — Z888 Allergy status to other drugs, medicaments and biological substances status: Secondary | ICD-10-CM | POA: Diagnosis not present

## 2020-03-11 LAB — CBC W/ AUTO DIFF
BASOPHILS ABSOLUTE COUNT: 0 10*9/L (ref 0.0–0.1)
BASOPHILS RELATIVE PERCENT: 0.3 %
EOSINOPHILS ABSOLUTE COUNT: 0.1 10*9/L (ref 0.0–0.4)
EOSINOPHILS RELATIVE PERCENT: 1.6 %
HEMATOCRIT: 39.4 % (ref 36.0–46.0)
HEMOGLOBIN: 13.1 g/dL (ref 12.0–16.0)
LARGE UNSTAINED CELLS: 3 % (ref 0–4)
LYMPHOCYTES ABSOLUTE COUNT: 1 10*9/L — ABNORMAL LOW (ref 1.5–5.0)
LYMPHOCYTES RELATIVE PERCENT: 25.1 %
MEAN CORPUSCULAR HEMOGLOBIN CONC: 33.2 g/dL (ref 31.0–37.0)
MEAN CORPUSCULAR HEMOGLOBIN: 31.8 pg (ref 26.0–34.0)
MEAN CORPUSCULAR VOLUME: 95.7 fL (ref 80.0–100.0)
MEAN PLATELET VOLUME: 7.8 fL (ref 7.0–10.0)
MONOCYTES ABSOLUTE COUNT: 0.3 10*9/L (ref 0.2–0.8)
MONOCYTES RELATIVE PERCENT: 7.6 %
NEUTROPHILS ABSOLUTE COUNT: 2.4 10*9/L (ref 2.0–7.5)
NEUTROPHILS RELATIVE PERCENT: 62.9 %
PLATELET COUNT: 297 10*9/L (ref 150–440)
RED BLOOD CELL COUNT: 4.12 10*12/L (ref 4.00–5.20)
RED CELL DISTRIBUTION WIDTH: 13.4 % (ref 12.0–15.0)
WBC ADJUSTED: 3.8 10*9/L — ABNORMAL LOW (ref 4.5–11.0)

## 2020-03-11 LAB — COMPREHENSIVE METABOLIC PANEL
ALBUMIN: 3.9 g/dL (ref 3.4–5.0)
ALKALINE PHOSPHATASE: 83 U/L (ref 46–116)
ALT (SGPT): 14 U/L (ref 10–49)
ANION GAP: 6 mmol/L (ref 5–14)
AST (SGOT): 18 U/L (ref <=34)
BILIRUBIN TOTAL: 0.4 mg/dL (ref 0.3–1.2)
BLOOD UREA NITROGEN: 13 mg/dL (ref 9–23)
BUN / CREAT RATIO: 15
CALCIUM: 9.3 mg/dL (ref 8.7–10.4)
CHLORIDE: 103 mmol/L (ref 98–107)
CO2: 29 mmol/L (ref 20.0–31.0)
CREATININE: 0.85 mg/dL — ABNORMAL HIGH
EGFR CKD-EPI AA FEMALE: 83 mL/min/{1.73_m2} (ref >=60)
EGFR CKD-EPI NON-AA FEMALE: 72 mL/min/{1.73_m2} (ref >=60)
GLUCOSE RANDOM: 81 mg/dL (ref 70–179)
POTASSIUM: 3.9 mmol/L (ref 3.4–4.8)
PROTEIN TOTAL: 6.7 g/dL (ref 5.7–8.2)
SODIUM: 138 mmol/L (ref 135–145)

## 2020-03-11 LAB — MAGNESIUM: MAGNESIUM: 1.9 mg/dL (ref 1.6–2.6)

## 2020-03-11 MED ADMIN — tixagevimab-cilgavimab 300 mg/3 mL- 300 mg/3 mL injection 6 mL: 6 mL | INTRAMUSCULAR | @ 17:00:00 | Stop: 2020-03-11

## 2020-03-11 NOTE — Unmapped (Signed)
0102 Blood drawn using (23) ga butterfly needle to (left antecubital) successful (x1) attempt. Pt tolerated without difficulty. Blood work tubed to lab.

## 2020-03-11 NOTE — Unmapped (Signed)
BMT Clinic Follow-up    Patient Name: Christine [name]  MRN: [id number]  Encounter date: 03/11/2020    Primary Care Provider: Allegra Grana, NP  BMT Attending MD:  Lanae Boast, MD.   ??  HPI: [name] is a 65 y.o. 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   Tacrolimus stop date 06/15/16.    ??  Interval History:  Ms. Sova presents today with her husband. She is feeling well today. Denies interim infections. Denies fever, chills, n/v/d. She if off all medications. She continues to be able to perform all activities without any limitations. Both she and her husband received two doses of COVID-19 vaccine. They are both planning to receive a booster when available.     Oncology History Overview Note   Referring/Local Oncologist: none    Diagnosis: de novo AML with mutated FLT3    Genetics:   Karyotype/FISH: 47XX +11     Molecular Genetics: FLT3 ITD present; IDH2: c.419G>A [p.Arg140Gln]  ??  No variants detected in ASXL1, BCOR, CEBPA, DNMT3A, ETV6/TEL, EZH2, FLT3, IDH1, KIT, NPM1, NRAS, RUNX1, SF3B1, SRSF2, STAG2, TET2, TP53, U2AF1, WT1, ZRSR2    Pertinent Phenotypic data: blasts expressed CD4, CD11b, CD11c, CD33, CD34, CD64, CD117, and HLA-DR and lack expression of other informative analyzed markers including B and T-cell antigens.     Disease-specific prognostic estimation: ELN Adverse (per FLT3 allelic ratio high per estimate by molecular pathologist)       Acute myeloid leukemia (CMS-HCC)   08/29/2015 Initial Diagnosis    Acute myeloid leukemia (RAF-HCC)     09/02/2015 -  Chemotherapy    Induction therapy with daunorubicin 60 mg/m2 IV daily x 3, cytarabine 200 mg/m2/d CIV x 7 days, midostaurin 50 mg PO BID days 8-21     09/22/2015 Biopsy    50% cellular marrow with 50% blasts     09/25/2015 -  Chemotherapy    CLAG, cladribine 5 mg/m2/day days 1-5, cytarabine 2 grams/m2 IV days 1-5, GCSF days 0-5     10/23/2015 Remission    CR     11/03/2015 -  Chemotherapy    Cytarabine 1.5 g/m2 IV Q12 hrs days 1, 3, 5     11/04/2015 Adverse Reaction    Cardiomyopathy     11/28/2015 Adverse Reaction    Stenotrophomonas maltophilia CVAD infection     12/19/2015 -  Chemotherapy    Cytarabine 1.5 g/m2 IV Q12 hrs days 1, 3, 5     02/24/2016 Transplant    CTN 1301 (control arm) conditioning regimen with Busulfan and Fludarabine followed by matched sibling allo, received bone marrow product with cell dose of 3.18 x 10^6.        Patient Active Problem List   Diagnosis   ??? Acute deep vein thrombosis (DVT) of left lower extremity (CMS-HCC)   ??? Osteoporosis (RAF-HCC)   ??? Acute myeloid leukemia (CMS-HCC)   ??? Heart failure (CMS-HCC)   ??? 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 LLE distal DVT  Chronic PE on 09/10/15 CTA   ??? Allogeneic stem cell transplant (RAF-HCC)   ??? Hypomagnesemia   ??? Immunocompromised state (CMS-HCC)   ??? Pancytopenia (CMS-HCC)   ??? Immunization due   ??? Pneumonia of both lower lobes due to infectious organism   ??? 11/08/2017: Lap appendectomy fo Acute perforated appendicitis     Review of Systems:  A full system  review was performed and was negative except as noted in the above interval history.    **Reviewed and updated past medical, surgical, social, and family history as appropriate.      Allergies   Allergen Reactions   ??? Dapsone      Relative G6PD deficiency (lvl=5.1, lower limit of normal is 5.3)   ??? Penicillins Rash and Hives   ??? Rasburicase      Relative G6PD deficiency (lvl=5.1, lower limit of normal is 5.3)   ??? Sulfasalazine Hives   ??? Vancomycin Other (See Comments)     Avoid if possible, but if needed: Red man syndrome- slow rate over 2 hours, benadryl and ranitidine premed   ??? Adhesive Rash     Uses mepore     ??? Amoxicillin Rash   ??? Cefepime Rash     Can trial again while taking with antihistamine medications/antihistamine premeds    06/10/17: Has successfully tolerated cefepime with benadryl 25 PO as premed    ??? Morphine Rash     Can trial again while taking with antihistamine medications/antihistamine premeds (fentanyl may be better alternative if possible)   ??? Tapentadol Rash     Uses mepore     Current Outpatient Medications   Medication Sig Dispense Refill   ??? ascorbic acid, vitamin C, (VITAMIN C) 500 MG tablet Take by mouth.     ??? calcium carbonate-vitamin D3 600 mg(1,500mg ) -800 unit Tab Take 1 tablet by mouth two (2) times a day. 60 tablet 5     No current facility-administered medications for this visit.     Facility-Administered Medications Ordered in Other Visits   Medication Dose Route Frequency Provider Last Rate Last Admin   ??? flu vacc qs2019-20 6mos up(PF) (FLUARIX, FLULAVAL, FLUZONE) 60 mcg (15 mcg x 4)/0.5 mL injection            ??? varicella-zoster gE-AS01B (PF) (SHINGRIX) 50 mcg/0.5 mL injection              Physical Exam:  BP 112/81  - Pulse 70  - Temp 36.6 ??C (97.9 ??F) (Oral)  - Resp 14  - Wt 67.9 kg (149 lb 9.6 oz)  - LMP  (LMP Unknown)  - SpO2 100%  - BMI 25.68 kg/m??   General : Well appearing, in no acute distress.   Head: Normocephalic, atraumatic.     ENT: Anicteric, Moist mucous membranes. No lesions or erythema.  Cardiovascular: Pulse normal rate, regularity and rhythm. S1 and S2 normal.  Lungs: Clear to auscultation bilaterally, without wheezes/crackles/rhonchi. Good air movement.   Skin: Warm, dry, intact. No rash noted.   Psychiatry: Alert and oriented to person, place, and time.   Abdomen : Normoactive bowel sounds, abdomen soft, non-tender   Extremeties: No edema.   Musculo Skeletal: Full range of motion in shoulder, elbow, hip knee, ankle, left hand and feet.  Neurologic: CNII-XII grossly intact. Normal strength and sensation throughout    Test Results:   Lab Results   Component Value Date    WBC 3.8 (L) 03/11/2020    HGB 13.1 03/11/2020    HCT 39.4 03/11/2020    PLT 297 03/11/2020       Lab Results   Component Value Date    NA 138 03/11/2020    K 3.9 03/11/2020    CL 103 03/11/2020    CO2 29.0 03/11/2020    BUN 13 03/11/2020    CREATININE 0.85 (H) 03/11/2020    GLU 81 03/11/2020  CALCIUM 9.3 03/11/2020    MG 1.9 03/11/2020    PHOS 4.0 11/24/2017       Lab Results   Component Value Date    BILITOT 0.4 03/11/2020    BILIDIR <0.10 04/15/2016    PROT 6.7 03/11/2020    ALBUMIN 3.9 03/11/2020    ALT 14 03/11/2020    AST 18 03/11/2020    ALKPHOS 83 03/11/2020    GGT 115 (H) 08/10/2016       Lab Results   Component Value Date    INR 1.06 11/24/2017    APTT 24.3 (L) 04/15/2016     Assessment/Plan:  **AML:??(FLT3-ITD, +11, and IDH2)??in CR prior to transplant.   06/22/16: BMBx:   - ??Normocellular bone marrow (40%) with TLH and 3% blasts.  - ??Routine cytogenetics: donor karyotype; FISH (trisomy 11) results are normal.  - ??Flow cytometric MRD results reveal no evidence of aberrant myeloid antigen expression or abnormal myeloblasts.  - ??DNA Chimerism see below.  - Considered??maintenance with FLT3 inhibitor??(sorafenib). Was reluctant to start around D+45-60 due to pancytopenia and changing chimerism. Decided against it. 10 months after transplant maintenance was discussed again - decided against it.     03/15/18: BMBx (2 years):  -  Normocellular bone marrow (40%) with TLH and 1% blasts.  - ??Routine cytogenetics: donor karyotype; FISH (trisomy 11) results are normal.  - ??Flow cytometric MRD results reveal no evidence of aberrant myeloid antigen expression or abnormal myeloblasts.  - ??DNA Chimerism see below.   ??  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 ??  Conditioning:??  ????????????????????????Busulfan PK calculated from test dose 80 mg/hr daily and Fludarabine 40 mg/m2 daily day -5 thru day -2.  ????????????????????????Day 0 -??02/24/16  Cell dose:??3.24 x10^8 TNC (Marrow)  Chimerisms/Marrow:  03/31/16: Day 30 marrow normocellular w/1% blasts. FISH and cytos are normal. MRD negative.   BM Chimerisms show 75% donor CD3 and >95% donor in unfractionated compartment.   04/22/16: PB Chimerisms show 71% donor CD3 and >95% donor unfractionated.   05/05/16: BM Chimerisms show 65% donor CD3 and >95% donor unfractionated.   05/18/16: PB Chimerisms show 74% donor CD3 and 95% donor unfractionated.   06/01/16: PB Chimerisms show 69% donor CD3 and >95% donor unfractionated.  06/22/16: PB Chimerisms show 80% donor CD3 and >95% donor unfractionated  06/22/16: BM Chimerisms show 79% donor CD3 and >95% donor unfractionated.   07/06/16: PB Chimerisms show 77% donor CD3 and >95% donor unfractionated.  07/13/16: Off tac x 4 weeks.??Chimerism check next visit.  If no full chimerism at 8-10 weeks after discontinuation of tac will consider DLI. We do not have any donor cells stored. If we need to recollect for DLI, will consider mobilizing the donor and collecting CD3+ cells for DLI as well as CD34+ cells for another transplant.   07/26/16: Off tac almost 6 weeks. PB Chimerisms show 74% donor in CD3 and >95% donor unfractionated.   08/10/16: Off tac 8 weeks. Repeat DNA shows slight increase in CD3 to 79%. Plan after discussion with Dr. Oswald Hillock to re-collect sister for more donor cells for DLI. However, today with??elevated LFTs, this is new, will need to follow these and make sure no GVHD prior to contacting sister for further donation. This was discussed with Pam today.   08/18/16: Off tac 9 weeks.??PB Chimerisms show??85% donor CD3 and >95% donor unfractionated.  08/24/16: Off tac 10 weeks. BM Chimerisms show 79% donor CD3 and >95% donor unfractionated.  09/07/16: Off tac 12 weeks. PB Chimerisms show 91% donor CD3 and >95% donor unfractionated.??  09/28/16: PB chimerism 94% donor CD3 and >95% donor unfractionated.  10/26/16: PB chimerism 90% donor CD3 and >95% donor unfractionated.??  11/23/16: PB chimerism 95% donor CD3 and >95% donor unfractionated.??  12/22/16: PB chimerism 94% donor CD3 and >95% donor unfractionated.??  01/18/17: PB chimerism 93% donor CD3 and >95% donor unfractionated.??  02/15/17: PB chimerism 95% donor CD3 and >95% donor unfractionated.??  03/15/17: PB chimerism 95% donor CD3 and >95% donor unfractionated.??  04/12/17: PB chimerism 95% donor CD3 and >95% donor unfractionated.??  05/17/17: PB chimerism 95% donor CD3 and >95% donor unfractionated.??  06/21/17: PB chimerism >95% donor CD3 and >95% donor unfractionated.??  08/17/17:  PB chimerism >95% donor CD3 and >95% donor unfractionated.??  09/21/17: PB chimerism >95% donor CD3 and >95% donor unfractionated.??  11/30/17: PB chimerism 95% donor CD3 and >95% donor unfractionated.??  12/20/17: PB chimerism 95% donor CD3 and >95% donor unfractionated.??  03/01/18: PB chimerism 95% donor CD3 and >95% donor unfractionated.??  07/04/18: PB chimerism 94% donor CD3 and >95% donor unfractionated.??  07/12/18: PB chimerism >95% donor CD3 and >95% donor unfractionated.??  09/06/18: PB chimerism >95% donor CD3 and >95% donor unfractionated.  12/06/18: Not drawn. CBC WNL.   03/06/19: PB chimerism >95% donor CD3 and >95% donor unfractionated.  09/11/19: PB chimerism >95% donor CD3 and  95% donor unfractionated.    GVHD Prophy:??  - Methotrexate on day +1, 3, 6, 11 (received all 4 doses)  - Tac tapered and eventually DC'd 06/15/16????  - ??Has had no e/o GVHD,developed new elevated LFTs noted at visit on 7/31.   Liver US(08/18/16): 1.Patent hepatic vasculature with normal flow direction 2.Large simple hepatic cysts.3. Stone in gallbladder.   - 01/18/17: LFTs continue to improve.   - 09/11/19 - present: LFT WNL.  ??  Heme:??CBC stable.   Transfusion criteria: * Pre-med with tylenol and Zyrtec pre- blood or platelet transfusion.   - Hgb <8 and Plt <10K or bleeding.   - Granix if ANC <0.5.   - 07/04/18: ANC 1.9. Has been waxing and waning. Will repeat next week.   - 12/06/18: ANC 2.9 and ALC 1.2.  - 03/06/19: ANC 2.1 and ALC 1.0.    - 09/11/19: ANC 2.1 and ALC 1.1.  ??  - 03/11/20: ANC 2.4 and ALC 1.0.     Hx of DVT:  - Completed 6 mos or anticoagulation.     **ID:??Afeb. No evidence of active infection.   - 03/11/20: No interim infections.     Hx of acute appendicitis and serositis, complicated by abscess. Fully recovered.     Prophylaxis:   - Continue Valtrex for Zoster prophy 500 mg qd. Discontinued after received two doses of Shingrix.   - Bactrim stopped due to pancytopenia. Allergic to dapsone. Continued Pentamidine until 07/12/17.   - Vaccines as below.  ??  Low CD4  - 06/30/16: Off IS. CD4 count 126.   - 09/07/16: CD4 count 137. ??  - 09/28/16: Received Pentamidine today. Plan to check CD4 level at next visit and continue with monthly pentamidine.   - 10/26/16: CD4 count 180. Continue monthly pentamidine.   - 11/23/16: CD4 count 167. Continue monthly pentamidine.   - 12/22/16: Continue monthly pentamidine (today).  - 01/19/16: Pentamidine due today.    - 02/15/17: Repeat CD4 count 188. Continue pentamidine.   - 03/15/17: Received dose today.   - 04/12/17: Received dose today.   -  05/17/17: CD4 count 216. Received pentamidine today. Will discuss restarting Bactrim DS on Sat and Sun.  - 06/21/17: Pentamidine today. After recovery from cold will discuss restarting Bactrim DS on Sat and Sun.  - 07/12/17: Will DC pentamidine.   - 03/01/18: CD4 count 334.   ??  Viral studies:  - 11/23/16: Viral panels have been negative. Will stop monitoring unless clinically indicated.    - 03/15/17: [facility] Respiratory virus panel positive for rhinovirus.     - 06/08/17: Parainfluenza pneumonia. IgG on 5/30 was 630.   - 07/12/17: Fully recovered.   ??  Vaccine:   - 09/07/16: First set of post-transplant vaccines. Next set due in the end of February - next visit   Will also consider Shingrix - likely after her immunity is a bit better.    - 03/15/17: Delay vaccines due to cold symptoms. Will plan on the next dose of post-transplant vaccines and Shingrix next visit.   - 04/12/17: Today 12 months post transplant vaccines. First dose of Shingrix vaccine. Next set in 10/2017.   - 12/20/17:  Shingrix given today as well as flu shot. - 02/01/18: Valtrex completed last week. S/P Shingrix X 2. No further Valtrex needed.   - 03/01/18: Completed post transplant vaccines today.  - 12/06/18: Received a Flu shot last month.   - 03/06/19: Received two doses of COVID-19 vaccine.   - 09/11/19: Planning to receive a booster when available. Also planning to receive Flu vaccine when available.    - 10/2019 Booster Pfeizer.   - 03/11/20: Evusheld.     **CV:   Hx of anthracycline induced cardiomyopathy:  - Nov 2017 w/EF 35%. This has now resolved with 01/2016 Echo showing estimated EF of 50%.   - Treated with lisinopril and metoprolol (Metoprolol 50mg  XL).   - Now off all antihypertensives due to intolerance (hypotension).   - 03/15/17: BP WNL. Asymptomatic.   - 04/12/17: Patient reported that her PCP checked her cholesterol and it was borderline high 239. She is wondering if she should take a lipid lowering agents recommended by her PCP. I advised her to discuss this with Dr. Barbette Merino whom she is scheduled to see in a few months.   - 09/11/19: She is planning to see Dr. Barbette Merino later this year - will call to make an appt.   - 03/11/20: She is following with cardiology.     ??  HTN:??Lisinopril stopped d/t AKI.   - 04/07/16: D/C'd Norvasc. Due to asymptomatic hypotension. Pre-transplant blood pressures run in the 100/70-80 range per her report.   Per Dr. Barbette Merino, ok to stop metoprolol given low BPs with plan to repeat Echo in 6-8 weeks.  - 07/06/16: Baseline ECHO today showed EF of 45-50%. DC metoprolol.   - 07/13/16: Follow BP. Will repeat ECHO 8-12 weeks after stopping metoprolol. Repeat ECHO scheduled for 10/26/2016.  - 10/26/16: Repeat ECHO showed normal EF. Ms. Putnam saw Ramond Marrow who decided not to restart ACE inhibitor or beta blocker. Recommended annual follow up with ECHO of the heart.    -03/01/18: Scheduled with Dr. Barbette Merino-  delayed due to COVID19.   - 07/04/18: Needs to reschedule follow up with Ramond Marrow.   - 12/06/18: Rinaldo Cloud saw Ramond Marrow last week.   - 03/11/20: As above.     **GI/Hepatic:??  - Completed VOD prophylaxis.   - Elevated LFTs:??Mild elevation at visit on 07/26/16. 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   ---??08/10/16 AST and ALT now 107/170 respectively. Normal amylase and lipase. No abdominal  pain or GI symptoms.   ---??08/13/16 ????AST 191 and ALT 283. Alk phos 177.  ---??08/18/16 ??AST 144 and ALT 264. Alk phos 218. Liver US: with patent hepatic vasculature notable for large simple hepatic cyst and gallstones.  ---??09/07/16: AST 136 and ALT 242. Alk phos 197. Stable.   ---??09/28/16: AST 91 and ALT 159. Alk phos 128. LFTs continue to improve.   --- 10/26/16: AST 84 and ALT 139. Alk phos 114. LFTs continue to improve.   --- 11/23/16:  AST 64 and ALT 102. Alk phos 96. LFTs continue to improve.   --- 12/22/16:  AST 51 and ALT 72. Alk phos 79. LFTs continue to improve.   --- 01/18/17: AST 42 and ALT 53. Alk phos 75. LFTs continue to improve.    --- 02/15/17: AST 47 and ALT 53. Alk phos 71. Stable.   --- 06/21/17 - present: LFT WNL.   ??  **Bone Health:  - 03/31/16: DEXA scan showed osteoporosis of the spine and osteopenia of the femoral neck. Zometa and Ca/Vit D.   - 04/02/16: Received 1st dose of Zometa.   - 07/06/16. 2nd dose of Zometa. Completed. Continue Ca/Vit D supplement.   - 03/15/17: Patient received two doses of Zometa. Continue Ca/Vit D supplement. Vit D 38.9 WNL.   - 07/04/18: Will schedule Dexa scan and Zometa. Will coordinate with next visit.  - 09/06/18: Dexa scan indicative of low bone density.   - 12/06/18: Scheduled for Zometa. Complicated by significant extravasation.   - 03/06/19: Received Reclast.   - 09/11/19: Plan to repeat Dexa scan early next year to have results before determining whether or not she needs 3rd dose of Reclast. Ordered. In the meantime will continue Calcium/vit D supplementation.   - 03/11/20: Dexa scan repeat today. Osteopenia unchanged.   ??  Plan:   - 4 yr. Clinically doing well. CBC stable. In long term follow up.   - Patient has osteoporosis with diagnosis preceding allo-SCT. Repeat dexa scan indicative of low bone density received second dose of Reclast. Further plan as above.   - Follow up with Ramond Marrow for cardiomyopathy.   - Following up for all health maintenance problems with her PCP.   - Evusheld today.  - Can receive another booster in a few weeks.   - RTC in 1 year.    After identifying the patient as meeting the EUA indications and institutional guidelines for use of Evusheld (tixagevimab co-packaged with cilgavimab), I discussed with the patient the indications and alternates to Evusheld  as pre-exposure prophylaxis during the COVID-19 pandemic under the FDA Emergency Use Authorization.  I described the meaning of EUA status, the administration, potential toxicities and limitations of Evusheld for prevention of severe COVID-19 infection.  I provided the patient with the FDA EUA FAQ and information for patients and caregivers documents. After this discussion and review of the provided information, the patient elected to receive Evusheld.    Willaim Bane, MD  [facility]

## 2020-03-11 NOTE — Unmapped (Signed)
Frequently Asked Questions on the Emergency Use Authorization for Evusheld (tixagevimab co-packaged with cilgavimab) for Pre-exposure Prophylaxis (PrEP) of COVID-19    Q: What is an Emergency Use Authorization (EUA)?  A: Under section 564 of the [employer], Drug & Cosmetic Act, after a declaration by the [facility] Secretary based on one of four types of determinations, FDA may authorize an unapproved product or unapproved uses of an approved product for emergency use. In issuing an EUA, FDA must determine, among other things, that based on the totality of scientific evidence available to the Agency, including data from adequate and well-controlled clinical trials, if available, it is reasonable to believe that the product may be effective in diagnosing, treating, or preventing a serious or life-threatening disease or condition caused by a chemical, biological, radiological, or nuclear agent; that the known and potential benefits, when used to treat, diagnose or prevent such disease or condition, outweigh the known and potential risks for the product; and that there are no adequate, approved, and available alternatives. Emergency use authorization is NOT the same as FDA approval or licensure.    Q: What does this EUA authorize? What are the limitations of authorized use?  A: The EUA authorizes [employer]'s Evusheld (tixagevimab co-packaged with cilgavimab) for emergency use as pre-exposure prophylaxis for prevention of COVID-19 in adults and pediatric individuals (47 years of age and older weighing at least 40 kg):    ? Who are not currently infected with SARS-CoV-2 and who have not had a known recent exposure to an individual infected with SARS-CoV-2 and  o Who have moderate to severe immune compromise due to a medical condition or receipt of immunosuppressive medications or treatments and may not mount an adequate immune response to COVID-19 vaccination or  o For whom vaccination with any available COVID-19 vaccine, according to the approved or authorized schedule, is not recommended due to a history of severe adverse reaction (e.g., severe allergic reaction) to a COVID-19 vaccine(s) and/or COVID-19 vaccine component(s).    Limitations of Authorized Use    ? Evusheld is not authorized for use in individuals:  o For treatment of COVID-19, or  o For post-exposure prophylaxis of COVID-19 in individuals who have been exposed to someone infected with SARS-CoV-2.  ? Pre-exposure prophylaxis with Evusheld is not a substitute for vaccination in individuals for whom COVID-19 vaccination is recommended. Individuals for whom COVID-19 vaccination is recommended, including individuals with moderate to severe immune compromise who may derive benefit from COVID-19 vaccination, should receive COVID-19 vaccination.  ? In individuals who have received a COVID-19 vaccine, Evusheld should be administered at least two weeks after vaccination.    Q: What are some medical conditions or treatments that may lead to an inadequate immune response to the COVID-19 vaccination?  A: Medical conditions or treatments that may result in moderate to severe immunocompromise and an inadequate immune response to COVID-19 vaccination include but are not limited to:  ??? Active treatment for solid tumor and hematologic malignancies  ??? Receipt of solid-organ transplant and taking immunosuppressive therapy  ??? Receipt of chimeric antigen receptor (CAR)-T-cell or hematopoietic stem cell transplant (within 2 years of transplantation or taking immunosuppression therapy)  ??? Moderate or severe primary immunodeficiency (e.g., DiGeorge syndrome, Wiskott-Aldrich syndrome)  ??? Moderate or severe primary immunodeficiency (e.g., DiGeorge syndrome, Wiskott-Aldrich syndrome)  ??? Active treatment with high-dose corticosteroids (i.e., ?20 mg prednisone or equivalent per day when administered for ?2 weeks), alkylating agents, antimetabolites, transplant-related immunosuppressive drugs, cancer chemotherapeutic agents classified as severely immunosuppressive,  tumor-necrosis (TNF) blockers, and other biologic agents that are immunosuppressive or immunomodulatory (e.g., B-cell depleting agents)    For additional information, refer to the [facility] Vaccines & Immunizations website.    Q: Is Evusheld approved by the FDA to prevent or treat COVID-19?  A: No. Evusheld is not FDA-approved to prevent or treat any diseases or conditions, including COVID-19. Evusheld is an investigational drug.     Q: How can Evusheld be obtained for use under the EUA?  A: For questions on how to obtain Evusheld, please contact [email or web address]@hhs .gov.     Q: Are tixagevimab and cilgavimab monoclonal antibodies? What is a monoclonal antibody?   A: Yes, tixagevimab and cilgavimab are monoclonal antibodies. Monoclonal antibodies are laboratory-produced molecules engineered to serve as substitute antibodies that can restore, enhance or mimic the immune system's attack on pathogens. Evusheld is designed to block viral attachment and entry into human cells, thus neutralizing the virus.     Q: When should Evusheld be administered to a patient?  A: Patients should talk to their healthcare provider to determine whether, based on their individual circumstances, they are eligible to receive Evusheld, and when it should be administered.    More information about administration is available in the Fact Sheet for Health Care Providers.    Q: Are there potential side effects of Evusheld?  A: Possible side effects of Evusheld include the following:     Allergic reactions can happen during and after injection of Evusheld. Reactions to Evusheld may include difficulty breathing or swallowing; shortness of breath; wheezing; swelling of the face, lips, tongue or throat; rash including hives; or itching.    The side effects of getting any medicine by intramuscular injection may include pain, bruising of the skin, soreness, swelling, and possible bleeding or infection at the injection site.     Serious cardiac adverse events (such as myocardial infarction and heart failure) were infrequent in the clinical trial evaluating Evusheld for pre-exposure prophylaxis for prevention. However, more trial participants had serious cardiac adverse events after receiving Evusheld compared to placebo. These participants all had risk factors for cardiac disease or a history of cardiovascular disease before participating in the clinical trial. It is not clear if Evusheld caused these cardiac adverse events.    These are not all the possible side effects of Evusheld. Not a lot of people have been given Evusheld. Serious and unexpected side effects may happen. Evusheld is still being studied so it is possible that all of the risks are not known at this time.    Q: Are there reporting requirements for health care facilities and providers as part of the EUA?  A: Yes. As part of the EUA, FDA requires health care providers who prescribe Evusheld to report all medication errors and serious adverse events considered to be potentially related to Evusheld through FDA's MedWatch Adverse Event Reporting program. Providers can complete and submit the report online; or download and complete the form, then submit it via fax at 1-800-FDA-0178. This requirement is outlined in the EUA's health care provider Fact Sheet. FDA MedWatch forms should also be provided to [employer].    Health care facilities and providers must report therapeutics information and utilization data as directed by the U.S. Department of Health and [employer]. Such information and data should be reported through [facility] Protect, [employer], or [employer] (NHSN).    Q: Do patient outcomes need to be reported under the EUA?  A: No, reporting of patient outcomes is  not required under the EUA. However, reporting of all medication errors and serious adverse events considered to be potentially related to Evusheld occurring during treatment is required.    Q: Can health care providers share the patient/caregiver Fact Sheet electronically?  A: The letter of authorization for Evusheld, requires that Fact Sheets be made available to health care providers and to patients/caregivers ???through appropriate means.??? Electronic delivery of the patient/caregiver Fact Sheet is an appropriate means. For example, when the patient requests the Fact Sheet electronically, it can be delivered as a PDF prior to medication administration. Health care providers should confirm receipt of the Fact Sheet with the patient.     Q: Can I receive Evusheld if I recently received a COVID-19 vaccine?  A: Evusheld may reduce your body's immune response to a COVID-19 vaccine. If you receive a COVID-19 vaccine, you should wait to receive Evusheld until at least two weeks after your COVID-19 vaccination.     Q: Are there data showing Evusheld may provide benefit for pre-exposure prophylaxis for prevention of COVID-19 in certain patients?  A: Yes. The most important scientific evidence supporting the authorization of Evusheld is from [employer], a randomized, double-blind, placebo-controlled clinical trial in adults who had not received a COVID-19 vaccine and did not have a history of SARS-CoV-2 infection or test positive for SARS-CoV-2 infection at the start of the trial. All trial participants were either ?67 years of age, had a pre-specified co-morbidity (obesity, congestive heart failure, chronic obstructive pulmonary disease, chronic kidney disease, chronic liver disease, immunocompromised state, or previous history of severe or serious adverse event after receiving any approved vaccine), or were at increased risk of SARS-CoV-2 infection due to their living situation or occupation.  The main outcome measured in the trial was whether the trial participant had a case of documented COVID-19 after receiving Evusheld or placebo and before day 183 of the trial. In this trial, 3,441 people received Evusheld and 1,731 received a placebo. In the primary analysis, Evusheld recipients saw a 77% reduced risk of developing COVID-19 compared to those who received a placebo, a statistically significant difference. In additional analyses, the reduction in risk of developing COVID-19 was maintained for Evusheld recipients through six months. The safety and effectiveness of this investigational therapy for use in the pre-exposure prevention of COVID-19 continue to be evaluated.  Details on the clinical trial results can be found in Section 14 of the authorized Fact Sheet for Health Care Providers.    Last Updated: 12/19/2019    Fact Sheet for Patients, Parents And Caregivers Emergency Use Authorization (EUA) of EVUSHELD??? (tixagevimab co-packaged with cilgavimab) for Coronavirus Disease 2019 (COVID-19)      You are being given this Fact Sheet because your healthcare provider believes it is necessary to provide you with EVUSHELD (tixagevimab co-packaged with cilgavimab) for pre-exposure prophylaxis for prevention of coronavirus disease 2019 (COVID-19) caused by the SARS-CoV-2 virus.    This Fact Sheet contains information to help you understand the potential risks and potential benefits of taking EVUSHELD, which you have received or may receive.    The U.S. Food and Drug Administration (FDA) has issued an Emergency Use Authorization (EUA) to make EVUSHELD available during the COVID-19 pandemic (for more details about an EUA please see ???What is an Emergency Use Authorization???? at the end of this document). EVUSHELD is not an FDA-approved medicine in the [place].    Read this Fact Sheet for information about EVUSHELD. Talk to your healthcare provider if you have any  questions. It is your choice to receive or not receive EVUSHELD.    What is COVID-19?   COVID-19 is caused by a virus called a coronavirus. You can get COVID-19 through close contact with another person who has the virus.    COVID-19 illnesses have ranged from very mild (including some with no reported symptoms) to severe, including illness resulting in death. While information so far suggests that most COVID-19 illness is mild, serious illness can happen and may cause some of your other medical conditions to become worse. Older people and people of all ages with severe, long-lasting (chronic) medical conditions like heart disease, lung disease, and diabetes, for example, seem to be at higher risk of being hospitalized for COVID-19.    What is EVUSHELD (tixagevimab co-packaged with cilgavimab)?  EVUSHELD is an investigational medicine used in adults and adolescents (87 years of age and older who weigh at least 88 pounds [40 kg]) for preexposure prophylaxis for prevention of COVID-19 in persons who are:  ??? not currently infected with SARS-CoV-2 and who have not had recent known close contact with someone who is infected with SARS-CoV-2 and  o Who have moderate to severe immune compromise due to a medical condition or have received immunosuppressive medicines or treatments and may not mount an adequate immune response to COVID-19 vaccination or  o For whom vaccination with any available COVID-19 vaccine, according to the approved or authorized schedule, is not recommended due to a history of severe adverse reaction (such as severe allergic reaction) to a COVID-19 vaccine(s) or COVID-19 vaccine ingredient(s).    EVUSHELD is investigational because it is still being studied. There is limited information known about the safety and effectiveness of using EVUSHELD for pre-exposure prophylaxis for prevention of COVID-19. EVUSHELD is not authorized for post-exposure prophylaxis for prevention of COVID-19.    The FDA has authorized the emergency use of EVUSHELD for pre-exposure prophylaxis for prevention of COVID-19 under an Emergency Use Authorization (EUA).    What should I tell my healthcare provider before I receive EVUSHELD? Tell your healthcare provider if you:  ??? Have any allergies   ??? Have low numbers of blood platelets (which help blood clotting), a bleeding disorder, or are taking anticoagulants (to prevent blood clots)  ??? Have had a heart attack or stroke, have other heart problems, or are at highrisk of cardiac (heart) events  ??? Are pregnant or plan to become pregnant  ??? Are breastfeeding a child  ??? Have any serious illness  ??? Are taking any medications (prescription, over-the-counter, vitamins, or herbal products)    How will I receive EVUSHELD?  ??? EVUSHELD consists of two investigational medicines, tixagevimab and cilgavimab.  ??? You will receive 1 dose of EVUSHELD, consisting of 2 separate injections (tixagevimab and cilgavimab).  ??? EVUSHELD will be given to you by your healthcare provider as 2 intramuscular injections. They are usually, given one after the other, 1 into each of your buttocks.    After the initial dose, if your healthcare provider determines that you need to receive additional doses of EVUSHELD for ongoing protection, the additional doses would be administered once every 6 months.    Who should generally not take EVUSHELD?  Do not take EVUSHELD if you have had a severe allergic reaction to EVUSHELD or any ingredient in EVUSHELD.    What are the important possible side effects of EVUSHELD?  Possible side effects of EVUSHELD are:   ??? Allergic reactions. Allergic reactions can happen during and after  injection of EVUSHELD. Tell your healthcare provider right away if you get any of the following signs and symptoms of allergic reactions: fever, chills, nausea, headache, shortness of breath, low or high blood pressure, rapid or slow heart rate, chest discomfort or pain, weakness, confusion, feeling tired, wheezing, swelling of your lips, face, or throat, rash including hives, itching, muscle aches, dizziness and sweating. These reactions may be severe or life threatening.  ??? Cardiac (heart) events: Serious cardiac adverse events have happened, but were not common, in people who received EVUSHELD and also in people who did not receive EVUSHELD in the clinical trial studying pre-exposure prophylaxis for prevention of COVID-19. In people with risk factors for cardiac events (including a history of heart attack), more people who received EVUSHELD experienced serious cardiac events than people who did not receive EVUSHELD. It is not known if these events are related to [facility] or underlying medical conditions. Contact your healthcare provider or get medical help right away if you get any symptoms of cardiac events, including pain, pressure, or discomfort in the chest, arms, neck, back, stomach or jaw, as well as shortness of breath, feeling tired or weak (fatigue), feeling sick (nausea), or swelling in your ankles or lower legs.    The side effects of getting any medicine by intramuscular injection may include pain, bruising of the skin, soreness, swelling, and possible bleeding or infection at the injection site.    These are not all the possible side effects of EVUSHELD. Not a lot of people have been given EVUSHELD. Serious and unexpected side effects may happen. EVUSHELD is still being studied so it is possible that all of the risks are not known at this time.    It is possible that EVUSHELD may reduce your body???s immune response to a COVID-19 vaccine. If you have received a COVID-19 vaccine, you should wait to receive EVUSHELD until at least 2 weeks after COVID-19 vaccination.    What other prevention choices are there?  Vaccines to prevent COVID-19 are approved or available under Emergency Use Authorization. Use of EVUSHELD does not replace vaccination against COVID-19. For more information about other medicines authorized for treatment or prevention of COVID-19 go to ThenWeb.com.ee for more information.    It is your choice to receive or not receive EVUSHELD. Should you decide not to receive EVUSHELD, it will not change your standard medical care.    EVUSHELD is not authorized for post-exposure prophylaxis of COVID-19.    What if I am pregnant or breastfeeding?  If you are pregnant or breastfeeding, discuss your options and specific situation with your healthcare provider.     How do I report side effects with EVUSHELD?  Contact your healthcare provider if you have any side effects that bother you or do not go away. Report side effects to FDA MedWatch at [employer].be or call 1-800-FDA-1088 or call [employer] at [phone].    Additional Information  If you have questions, visit the website or call the telephone number provided below.    To access the most recent EVUSHELD Fact Sheets, please scan the QR code provided below.  Website Telephone number   [email or web address]       [phone]     How can I learn more about COVID-19?  ??? Ask your healthcare provider.  ??? Visit [email or web address]  ??? Contact your local or state public health department.    What is an Emergency Use Authorization?  The [place] FDA has made EVUSHELD (tixagevimab co-packaged with cilgavimab) available under  an emergency access mechanism called an Emergency Use Authorization EUA. The EUA is supported by a Surveyor, minerals and Human Service (HHS) declaration that circumstances exist to justify the emergency use of drugs and biological products during the COVID-19 pandemic.    EVUSHELD for pre-exposure prophylaxis for prevention of coronavirus disease 2019 (COVID-19) caused by the SARS-CoV-2 virus has not undergone the same type of review as an FDA-approved product. In issuing an EUA under the COVID-19 public health emergency, the FDA has determined, among other things, that based on the total amount of scientific evidence available including data from adequate and wellcontrolled clinical trials, if available, it is reasonable to believe that the product may be effective for diagnosing, treating, or preventing COVID-19, or a serious or lifethreatening disease or condition caused by COVID-19; that the known and potential benefits of the product, when used to diagnose, treat, or prevent such disease or condition, outweigh the known and potential risks of such product; and that there are no adequate, approved and available alternatives.    All of these criteria must be met to allow for the product to be used in the treatment of patients during the COVID-19 pandemic. The EUA for EVUSHELD is in effect for the duration of the COVID-19 declaration justifying emergency use of EVUSHELD, unless terminated or revoked (after which EVUSHELD may no longer be used under the EUA).      Distributed by: [employer], [place], [place] [address]    Manufactured by: [employer], [address], [place], [place] [address], [place]    ??[employer] 2021. All rights reserved.

## 2020-06-19 ENCOUNTER — Ambulatory Visit: Payer: PPO | Admitting: Dermatology

## 2020-06-19 ENCOUNTER — Other Ambulatory Visit: Payer: Self-pay

## 2020-06-19 DIAGNOSIS — Z1283 Encounter for screening for malignant neoplasm of skin: Secondary | ICD-10-CM

## 2020-06-19 DIAGNOSIS — D229 Melanocytic nevi, unspecified: Secondary | ICD-10-CM

## 2020-06-19 DIAGNOSIS — L821 Other seborrheic keratosis: Secondary | ICD-10-CM | POA: Diagnosis not present

## 2020-06-19 DIAGNOSIS — D1800 Hemangioma unspecified site: Secondary | ICD-10-CM | POA: Diagnosis not present

## 2020-06-19 DIAGNOSIS — Z85828 Personal history of other malignant neoplasm of skin: Secondary | ICD-10-CM

## 2020-06-19 DIAGNOSIS — Z86018 Personal history of other benign neoplasm: Secondary | ICD-10-CM

## 2020-06-19 DIAGNOSIS — L578 Other skin changes due to chronic exposure to nonionizing radiation: Secondary | ICD-10-CM

## 2020-06-19 DIAGNOSIS — L814 Other melanin hyperpigmentation: Secondary | ICD-10-CM | POA: Diagnosis not present

## 2020-06-19 NOTE — Patient Instructions (Signed)

## 2020-06-19 NOTE — Progress Notes (Signed)
   Follow-Up Visit   Subjective  Robyn Montgomery is a 65 y.o. female who presents for the following: Annual Exam (History of BCC and Dysplastic nevi - TBSE today). The patient presents for Total-Body Skin Exam (TBSE) for skin cancer screening and mole check.  The following portions of the chart were reviewed this encounter and updated as appropriate:   Tobacco  Allergies  Meds  Problems  Med Hx  Surg Hx  Fam Hx      Review of Systems:  No other skin or systemic complaints except as noted in HPI or Assessment and Plan.  Objective  Well appearing patient in no apparent distress; mood and affect are within normal limits.  A full examination was performed including scalp, head, eyes, ears, nose, lips, neck, chest, axillae, abdomen, back, buttocks, bilateral upper extremities, bilateral lower extremities, hands, feet, fingers, toes, fingernails, and toenails. All findings within normal limits unless otherwise noted below.  Left parasternal Well healed EDC site with 3 mm pink patch   Assessment & Plan   Lentigines - Scattered tan macules - Due to sun exposure - Benign-appering, observe - Recommend daily broad spectrum sunscreen SPF 30+ to sun-exposed areas, reapply every 2 hours as needed. - Call for any changes  Seborrheic Keratoses - Stuck-on, waxy, tan-brown papules and/or plaques  - Benign-appearing - Discussed benign etiology and prognosis. - Observe - Call for any changes  Melanocytic Nevi - Tan-brown and/or pink-flesh-colored symmetric macules and papules - Benign appearing on exam today - Observation - Call clinic for new or changing moles - Recommend daily use of broad spectrum spf 30+ sunscreen to sun-exposed areas.   Hemangiomas - Red papules - Discussed benign nature - Observe - Call for any changes  Actinic Damage - Chronic condition, secondary to cumulative UV/sun exposure - diffuse scaly erythematous macules with underlying dyspigmentation - Recommend  daily broad spectrum sunscreen SPF 30+ to sun-exposed areas, reapply every 2 hours as needed.  - Staying in the shade or wearing long sleeves, sun glasses (UVA+UVB protection) and wide brim hats (4-inch brim around the entire circumference of the hat) are also recommended for sun protection.  - Call for new or changing lesions.  Skin cancer screening performed today.  History of Basal Cell Carcinoma of the Skin - No evidence of recurrence today - Recommend regular full body skin exams - Recommend daily broad spectrum sunscreen SPF 30+ to sun-exposed areas, reapply every 2 hours as needed.  - Call if any new or changing lesions are noted between office visits  History of Dysplastic Nevi - No evidence of recurrence today - Recommend regular full body skin exams - Recommend daily broad spectrum sunscreen SPF 30+ to sun-exposed areas, reapply every 2 hours as needed.  - Call if any new or changing lesions are noted between office visits  History of basal cell carcinoma (BCC) Left parasternal  No evidence of recurrence today. Recheck on follow up.  Skin cancer screening  Return in about 6 months (around 12/19/2020).  I, Ashok Cordia, CMA, am acting as scribe for Sarina Ser, MD .  Documentation: I have reviewed the above documentation for accuracy and completeness, and I agree with the above.  Sarina Ser, MD

## 2020-06-23 ENCOUNTER — Encounter: Payer: Self-pay | Admitting: Dermatology

## 2020-08-05 ENCOUNTER — Ambulatory Visit (INDEPENDENT_AMBULATORY_CARE_PROVIDER_SITE_OTHER): Payer: PPO | Admitting: Family

## 2020-08-05 ENCOUNTER — Other Ambulatory Visit: Payer: Self-pay

## 2020-08-05 ENCOUNTER — Other Ambulatory Visit (HOSPITAL_COMMUNITY)
Admission: RE | Admit: 2020-08-05 | Discharge: 2020-08-05 | Disposition: A | Discharge status: Home / Self Care | Payer: PPO | Source: Ambulatory Visit | Attending: Family | Admitting: Family

## 2020-08-05 ENCOUNTER — Encounter: Payer: Self-pay | Admitting: Family

## 2020-08-05 VITALS — BP 116/82 | HR 94 | Temp 98.2°F | Ht 69.95 in | Wt 146.2 lb

## 2020-08-05 DIAGNOSIS — Z1151 Encounter for screening for human papillomavirus (HPV): Secondary | ICD-10-CM | POA: Insufficient documentation

## 2020-08-05 DIAGNOSIS — M810 Age-related osteoporosis without current pathological fracture: Secondary | ICD-10-CM | POA: Diagnosis not present

## 2020-08-05 DIAGNOSIS — I700 Atherosclerosis of aorta: Secondary | ICD-10-CM | POA: Diagnosis not present

## 2020-08-05 DIAGNOSIS — Z01419 Encounter for gynecological examination (general) (routine) without abnormal findings: Secondary | ICD-10-CM | POA: Insufficient documentation

## 2020-08-05 DIAGNOSIS — Z0000 Encounter for general adult medical examination without abnormal findings: Secondary | ICD-10-CM | POA: Diagnosis not present

## 2020-08-05 LAB — LIPID PANEL
Cholesterol: 271 mg/dL — ABNORMAL HIGH (ref 0–200)
HDL: 54.1 mg/dL (ref >39.00)
LDL Cholesterol: 177 mg/dL — ABNORMAL HIGH (ref 0–99)
NonHDL: 216.82
Total CHOL/HDL Ratio: 5
Triglycerides: 197 mg/dL — ABNORMAL HIGH (ref 0.0–149.0)
VLDL: 39.4 mg/dL (ref 0.0–40.0)

## 2020-08-05 LAB — HEMOGLOBIN A1C: Hgb A1c MFr Bld: 5.8 % (ref 4.6–6.5)

## 2020-08-05 LAB — VITAMIN D 25 HYDROXY (VIT D DEFICIENCY, FRACTURES): VITD: 34.16 ng/mL (ref 30.00–100.00)

## 2020-08-05 LAB — TSH: TSH: 1.37 u[IU]/mL (ref 0.35–5.50)

## 2020-08-05 NOTE — Assessment & Plan Note (Addendum)
CBE and pap smear performed. she prefers to have them done annually.  We also agreed to continue screening for cervical cancer and decide at a later date when she will ultimately decide discontinue. Encouraged continued exercise.

## 2020-08-05 NOTE — Assessment & Plan Note (Signed)
Chronic, stable.  Advised patient to consider starting Crestor 5 mg.  She would like to await lipid panel is done today however she is very much considering medication.

## 2020-08-05 NOTE — Progress Notes (Signed)
Subjective:    Patient ID: Robyn Montgomery, female    DOB: [birth year], 65 y.o.   MRN: [id number]  CC: Robyn Montgomery is a 65 y.o. female who presents today for physical exam.    HPI: Feels well today. No complaints.  History of basal cell cancer, follows with dermatology Acute myeloid leukemia in remission follows with [facility] oncology, Dr Oretha Ellis Osteoporosis-she reports that she has not had Reclast in approximately 1 year.Reclast is managed by oncology.  Last bone density March 2022.  Lumbar spine osteoporosis, left proximal femur osteopenia  She continues to follow with Dr. Princella Pellegrini for cardiomyopathy. Colorectal Cancer Screening:  due 11/2023. Consult regarding interval with Octavia Bruckner 04/2019.   Breast Cancer Screening: Mammogram UTD Cervical Cancer Screening: due   Lung Cancer Screening: Doesn't have 20 year pack year history and age > 75 years yo 12 years         Tetanus - UTD        Pneumococcal - Complete  Labs: Screening labs today. 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  She also walks for 45 minutes with her husband 3 days/week.   Alcohol use: rare Smoking/tobacco use: Nonsmoker.    HISTORY:  Past Medical History:  Diagnosis Date   Basal cell carcinoma 01/20/2006   left upper back paraspinal   Basal cell carcinoma 06/22/2012   right sup med scapula, right med breast   Basal cell carcinoma 10/04/2013   right side near costal area, right chest   Basal cell carcinoma 10/07/2014   left mid back 2.0 cm lat to spine above braline   Basal cell carcinoma 05/26/2015   right sup nasolabial   Basal cell carcinoma 06/23/2016   left midline forehead   Basal cell carcinoma 12/13/2016   left sup chest, right lat elbow, right prox med calf   Basal cell carcinoma 06/13/2017   left preauricular, left medial scapula   Basal cell carcinoma 06/19/2018   left ant lat thigh, right mid pretibial   Basal cell carcinoma 12/25/2018    right sup med scapula   Basal cell carcinoma 06/28/2019   Upper back spinal   Basal cell carcinoma 06/28/2019   R forehead   Basal cell carcinoma 12/13/2019   left mid parasternal treated with EDC 01/31/20   Cancer ([place])    leukemia- post stem cell transplant   Dysplastic nevi 08/14/2019   L mid posterior thigh   Dysplastic nevus 01/16/2007   left upper gastric   Dysplastic nevus 10/04/2013   right UQA, right sacral lower back   Dysplastic nevus 06/23/2016   right prox tricep   Dysplastic nevus 12/13/2019   left low back paraspinal moderate atypia    Leukemia ([place])    Osteoporosis    Personal history of chemotherapy     Past Surgical History:  Procedure Laterality Date   BREAST BIOPSY Left    mole removed above left nipple, no bx   broken shoulder Right 2012   Plates and screws put in   [place]   Removed from left breast   SHOULDER SURGERY     Family History  Problem Relation Age of Onset   Arthritis Mother    Hypertension Mother    Cancer Mother 43       Breast   Osteoporosis Mother    Breast cancer Mother 68   Parkinsonism Father    Heart disease Maternal  Uncle    Cancer Paternal Grandfather        Oral      ALLERGIES: Dapsone, Rasburicase, Sulfasalazine, Penicillins, Sulfa antibiotics, Vancomycin, Cefepime, Morphine, Tape, and Tapentadol  Current Outpatient Medications on File Prior to Visit  Medication Sig Dispense Refill   ascorbic acid (VITAMIN C) 500 MG tablet Take by mouth.     Calcium Carb-Cholecalciferol 600-800 MG-UNIT TABS Take by mouth.     No current facility-administered medications on file prior to visit.    Social History   Tobacco Use   Smoking status: Never   Smokeless tobacco: Never  Vaping Use   Vaping Use: Never used  Substance Use Topics   Alcohol use: Not Currently    Alcohol/week: 0.0 standard drinks    Comment: Rare   Drug use: No    Review of Systems  Constitutional:   Negative for chills, fever and unexpected weight change.  HENT:  Negative for congestion.   Respiratory:  Negative for cough.   Cardiovascular:  Negative for chest pain, palpitations and leg swelling.  Gastrointestinal:  Negative for nausea and vomiting.  Musculoskeletal:  Negative for arthralgias and myalgias.  Skin:  Negative for rash.  Neurological:  Negative for headaches.  Hematological:  Negative for adenopathy.  Psychiatric/Behavioral:  Negative for confusion.      Objective:    BP 116/82 (BP Location: Left Arm, Patient Position: Sitting, Cuff Size: Large)   Pulse 94   Temp 98.2 F (36.8 C) (Oral)   Ht 5' 9.95" (1.777 m)   Wt 146 lb 3.2 oz (66.3 kg)   SpO2 99%   BMI 21.01 kg/m   BP Readings from Last 3 Encounters:  08/05/20 116/82  07/31/19 106/70  05/22/19 (!) 120/98   Wt Readings from Last 3 Encounters:  08/05/20 146 lb 3.2 oz (66.3 kg)  09/13/19 145 lb (65.8 kg)  07/31/19 145 lb (65.8 kg)    Physical Exam Vitals reviewed.  Constitutional:      Appearance: Normal appearance. She is well-developed.  Eyes:     Conjunctiva/sclera: Conjunctivae normal.  Neck:     Thyroid: No thyroid mass or thyromegaly.  Cardiovascular:     Rate and Rhythm: Normal rate and regular rhythm.     Pulses: Normal pulses.     Heart sounds: Normal heart sounds.  Pulmonary:     Effort: Pulmonary effort is normal.     Breath sounds: Normal breath sounds. No wheezing, rhonchi or rales.  Chest:  Breasts:    Breasts are symmetrical.     Right: No inverted nipple, mass, nipple discharge, skin change or tenderness.     Left: No inverted nipple, mass, nipple discharge, skin change or tenderness.  Abdominal:     General: Bowel sounds are normal. There is no distension.     Palpations: Abdomen is soft. Abdomen is not rigid. There is no fluid wave or mass.     Tenderness: There is no abdominal tenderness. There is no guarding or rebound.  Genitourinary:    Cervix: No cervical motion  tenderness, discharge or friability.     Uterus: Not enlarged, not fixed and not tender.      Adnexa:        Right: No mass, tenderness or fullness.         Left: No mass, tenderness or fullness.       Comments: Pap performed. No CMT. Unable to appreciated ovaries. Lymphadenopathy:     Head:     Right side of head:  No submental, submandibular, tonsillar, preauricular, posterior auricular or occipital adenopathy.     Left side of head: No submental, submandibular, tonsillar, preauricular, posterior auricular or occipital adenopathy.     Cervical:     Right cervical: No superficial, deep or posterior cervical adenopathy.    Left cervical: No superficial, deep or posterior cervical adenopathy.     Upper Body:     Right upper body: No pectoral adenopathy.     Left upper body: No pectoral adenopathy.  Skin:    General: Skin is warm and dry.  Neurological:     Mental Status: She is alert.  Psychiatric:        Speech: Speech normal.        Behavior: Behavior normal.        Thought Content: Thought content normal.       Assessment & Plan:   Problem List Items Addressed This Visit       Cardiovascular and Mediastinum   Atherosclerosis of aorta (HCC)    Chronic, stable.  Advised patient to consider starting Crestor 5 mg.  She would like to await lipid panel is done today however she is very much considering medication.         Musculoskeletal and Integument   Osteoporosis    Currently off of Reclast right now it seems.  Asked patient to reach out to [facility] oncology to further clarify if she was on holiday from [place] or plan was to pursue a biphosphonate.         Other   Routine general medical examination at a health care facility    CBE and pap smear performed. she prefers to have them done annually.  We also agreed to continue screening for cervical cancer and decide at a later date when she will ultimately decide discontinue. Encouraged continued exercise.        Other  Visit Diagnoses     Routine physical examination    -  Primary   Relevant Orders   Cytology - PAP( [facility])   Hemoglobin A1c   Lipid panel   TSH   VITAMIN D 25 Hydroxy (Vit-D Deficiency, Fractures)   MM 3D SCREEN BREAST BILATERAL        I have discontinued Ivan Anchors. Stough "Pam"'s mupirocin ointment. I am also having her maintain her Calcium Carb-Cholecalciferol and ascorbic acid.   No orders of the defined types were placed in this encounter.   Return precautions given.   Risks, benefits, and alternatives of the medications and treatment plan prescribed today were discussed, and patient expressed understanding.   Education regarding symptom management and diagnosis given to patient on AVS.   Continue to follow with Burnard Hawthorne, FNP for routine health maintenance.   [place] and I agreed with plan.   Mable Paris, FNP

## 2020-08-05 NOTE — Assessment & Plan Note (Addendum)
Currently off of Reclast right now it seems.  Asked patient to reach out to [facility] oncology to further clarify if she was on holiday from [place] or plan was to pursue a biphosphonate.

## 2020-08-05 NOTE — Patient Instructions (Signed)

## 2020-08-06 LAB — CYTOLOGY - PAP
Comment: NEGATIVE
Diagnosis: NEGATIVE
High risk HPV: NEGATIVE

## 2020-08-11 ENCOUNTER — Other Ambulatory Visit: Payer: Self-pay

## 2020-08-11 ENCOUNTER — Telehealth: Payer: Self-pay

## 2020-08-11 DIAGNOSIS — E785 Hyperlipidemia, unspecified: Secondary | ICD-10-CM

## 2020-08-11 MED ORDER — ROSUVASTATIN CALCIUM 5 MG PO TABS: 5.0000 mg | ORAL_TABLET | Freq: Every day | ORAL | 3 refills | Status: DC

## 2020-08-11 NOTE — Telephone Encounter (Signed)
LMTCB for lab results.  

## 2020-08-18 ENCOUNTER — Other Ambulatory Visit: Payer: Self-pay

## 2020-08-18 ENCOUNTER — Ambulatory Visit
Admission: RE | Admit: 2020-08-18 | Discharge: 2020-08-18 | Disposition: A | Discharge status: Home / Self Care | Payer: PPO | Source: Ambulatory Visit | Attending: Family | Admitting: Family

## 2020-08-18 DIAGNOSIS — Z0000 Encounter for general adult medical examination without abnormal findings: Secondary | ICD-10-CM

## 2020-08-18 DIAGNOSIS — Z1231 Encounter for screening mammogram for malignant neoplasm of breast: Secondary | ICD-10-CM | POA: Insufficient documentation

## 2020-09-16 ENCOUNTER — Ambulatory Visit: Payer: PPO

## 2020-09-23 ENCOUNTER — Ambulatory Visit (INDEPENDENT_AMBULATORY_CARE_PROVIDER_SITE_OTHER): Payer: PPO

## 2020-09-23 VITALS — Ht 69.0 in | Wt 146.0 lb

## 2020-09-23 DIAGNOSIS — Z0000 Encounter for general adult medical examination without abnormal findings: Secondary | ICD-10-CM

## 2020-09-23 NOTE — Progress Notes (Signed)
Subjective:   CAYLYN TEDESCHI is a 65 y.o. female who presents for Medicare Annual (Subsequent) preventive examination.  Review of Systems    No ROS.  Medicare Wellness Virtual Visit.  Visual/audio telehealth visit, UTA vital signs.   See social history for additional risk factors.   Cardiac Risk Factors include: advanced age (>34mn, >>44women)     Objective:    Today's Vitals   09/23/20 1234  Weight: 146 lb (66.2 kg)  Height: _0  (1.753 m)   Body mass index is 21.56 kg/m.  Advanced Directives 09/23/2020 09/13/2019 01/30/2018 08/26/2015 08/26/2015  Does Patient Have a Medical Advance Directive? _1   Type of AParamedicof [place]Living will [place] will [place] will Healthcare Power of [place] Does patient want to make changes to medical advance directive? No - Patient declined No - Patient declined - - No - Patient declined  Copy of [place] Chart? No - copy requested No - copy requested - No - copy requested No - copy requested    Current Medications (verified) Outpatient Encounter Medications as of 09/23/2020  Medication Sig   ascorbic acid (VITAMIN C) 500 MG tablet Take by mouth.   Calcium Carb-Cholecalciferol 600-800 MG-UNIT TABS Take by mouth.   rosuvastatin (CRESTOR) 5 MG tablet Take 1 tablet (5 mg total) by mouth daily.   No facility-administered encounter medications on file as of 09/23/2020.    Allergies (verified) Dapsone, Rasburicase, Sulfasalazine, Penicillins, Sulfa antibiotics, Vancomycin, Cefepime, Morphine, Tape, and Tapentadol   History: Past Medical History:  Diagnosis Date   Basal cell carcinoma 01/20/2006   left upper back paraspinal   Basal cell carcinoma 06/22/2012   right sup med scapula, right med breast   Basal cell carcinoma 10/04/2013   right side near costal area, right chest   Basal cell  carcinoma 10/07/2014   left mid back 2.0 cm lat to spine above braline   Basal cell carcinoma 05/26/2015   right sup nasolabial   Basal cell carcinoma 06/23/2016   left midline forehead   Basal cell carcinoma 12/13/2016   left sup chest, right lat elbow, right prox med calf   Basal cell carcinoma 06/13/2017   left preauricular, left medial scapula   Basal cell carcinoma 06/19/2018   left ant lat thigh, right mid pretibial   Basal cell carcinoma 12/25/2018   right sup med scapula   Basal cell carcinoma 06/28/2019   Upper back spinal   Basal cell carcinoma 06/28/2019   R forehead   Basal cell carcinoma 12/13/2019   left mid parasternal treated with EDC 01/31/20   [place]    [place]ukemia- [place] stem cell transplant   Dysplastic nevi 08/14/2019   L mid posterior thigh   Dysplastic nevus 01/16/2007   left upper gastric   Dysplastic nevus 10/04/2013   right UQA, right sacral lower back   Dysplastic nevus 06/23/2016   right prox tricep   Dysplastic nevus 12/13/2019   left low back paraspinal moderate atypia    [place]    Osteoporosis    Personal history of chemotherapy    Past Surgical History:  Procedure Laterality Date   BREAST BIOPSY Left    mole removed above left nipple, no bx   broken shoulder Right 2012   [place]  Removed from left breast   SHOULDER SURGERY     Family History  Problem Relation Age of Onset   Arthritis Mother    Hypertension Mother    Cancer Mother 86       Breast   Osteoporosis Mother    Breast cancer Mother 75   Parkinsonism Father    Heart disease Maternal Uncle    Cancer Paternal Grandfather        Oral   Social History   Socioeconomic History   Marital status: Married    Spouse name: Not on file   Number of children: Not on file   Years of education: Not on file   Highest education level: Not on file  Occupational History   Not on file  Tobacco Use    Smoking status: Never   Smokeless tobacco: Never  Vaping Use   Vaping Use: Never used  Substance and Sexual Activity   Alcohol use: Not Currently    Alcohol/week: 0.0 standard drinks    Comment: Rare   Drug use: No   Sexual activity: Yes    Partners: Male    Comment: Husband   Other Topics Concern   Not on file  Social History Narrative   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    married   No children    No pets    Right handed    Caffeine- Rare    Enjoys her new home, exercising, and cooking    Social Determinants of Health   Financial Resource Strain: Low Risk    Difficulty of Paying Living Expenses: Not hard at all  Food Insecurity: No Food Insecurity   Worried About Charity fundraiser in the Last Year: Never true   Arboriculturist in the Last Year: Never true  Transportation Needs: No Transportation Needs   Lack of Transportation (Medical): No   Lack of Transportation (Non-Medical): No  Physical Activity: Sufficiently Active   Days of Exercise per Week: 5 days   Minutes of Exercise per Session: 60 min  Stress: No Stress Concern Present   Feeling of Stress : Not at all  Social Connections: Unknown   Frequency of Communication with Friends and Family: Not on file   Frequency of Social Gatherings with Friends and Family: Not on file   Attends Religious Services: Not on Electrical engineer or Organizations: Not on file   Attends Archivist Meetings: Not on file   Marital Status: Married    Tobacco Counseling Counseling given: Not Answered   Clinical Intake:  Pre-visit preparation completed: Yes          How often do you need to have someone help you when you read instructions, pamphlets, or other written materials from your doctor or pharmacy?: 1 - Never Interpreter Needed?: No    Activities of Daily Living In your present state of health, do you have any difficulty performing the following activities: 09/23/2020  Hearing? N   Vision? N  Difficulty concentrating or making decisions? N  Walking or climbing stairs? N  Dressing or bathing? N  Doing errands, shopping? N  Preparing Food and eating ? N  Using the Toilet? N  In the past six months, have you accidently leaked urine? N  Do you have problems with loss of bowel control? N  Managing your Medications? N  Managing your Finances? N  Housekeeping or managing your Housekeeping? N  Some recent data might  be hidden    Patient Care Team: Burnard Hawthorne, FNP as PCP - General (Family Medicine)  Indicate any recent Medical Services you may have received from other than [facility] providers in the past year (date may be approximate).     Assessment:   This is a routine wellness examination for Natascha.  I connected with Madell today by telephone and verified that I am speaking with the correct person using two identifiers. Location patient: home Location provider: work Persons participating in the virtual visit: patient, Marine scientist.    I discussed the limitations, risks, security and privacy concerns of performing an evaluation and management service by telephone and the availability of in person appointments. The patient expressed understanding and verbally consented to this telephonic visit.    Interactive audio and video telecommunications were attempted between this provider and patient, however failed, due to patient having technical difficulties OR patient did not have access to video capability.  We continued and completed visit with audio only.  Some vital signs may be absent or patient reported.   Hearing/Vision screen Hearing Screening - Comments:: Patient is able to hear conversational tones without difficulty.  No issues reported. Vision Screening - Comments:: They have seen their ophthalmologist in the last 12 months.   Dietary issues and exercise activities discussed: Current Exercise Habits: Home exercise routine, Type of exercise: walking,  Intensity: Mild   Goals Addressed             This Visit's Progress    Follow up with Primary Care Provider       Keep routine maintenance appointments       Depression Screen PHQ 2/9 Scores 09/23/2020 09/13/2019 07/31/2019 07/26/2018 02/02/2016  PHQ - 2 Score 0 0 0 0 0  PHQ- 9 Score - 0 0 - -    Fall Risk Fall Risk  09/23/2020 09/13/2019 07/31/2019 12/06/2018 02/02/2016  Falls in the past year? 0 0 1 0 No  Comment - No falls since last reported - Emmi Telephone Survey: data to providers prior to load -  Number falls in past yr: - - 0 - -  Injury with Fall? 0 - 1 - -  Follow up Falls evaluation completed Falls evaluation completed Falls evaluation completed - -    FALL RISK PREVENTION PERTAINING TO THE HOME: Adequate lighting in your home to reduce risk of falls? Yes   ASSISTIVE DEVICES UTILIZED TO PREVENT FALLS: Use of a cane, walker or w/c? No   TIMED UP AND GO: Was the test performed? No .   Cognitive Function:  Patient is alert and oriented x3.  Denies difficulty focusing, making decisions, memory loss.  MMSE/6CIT deferred. Normal by direct communication/observation.       Immunizations Immunization History  Administered Date(s) Administered   DTaP / Hep B / IPV 09/07/2016, 04/12/2017, 03/01/2018   Dtap, Unspecified 05/11/2008   HiB (PRP-T) 09/07/2016, 04/12/2017, 03/01/2018   Influenza,inj,Quad PF,6+ Mos 10/29/2015, 10/26/2016, 12/20/2017   MMR 03/01/2018   PFIZER Comirnaty(Gray Top)Covid-19 Tri-Sucrose Vaccine 10/12/2019, 04/18/2020   PFIZER(Purple Top)SARS-COV-2 Vaccination 02/02/2019, 02/22/2019   Pneumococcal Conjugate-13 10/29/2015, 09/07/2016, 04/12/2017   Pneumococcal Polysaccharide-23 03/01/2018   Zoster Recombinat (Shingrix) 04/12/2017, 12/20/2017    Health Maintenance There are no preventive care reminders to display for this patient. Health Maintenance  Topic Date Due   COVID-19 Vaccine (5 - Booster for Pfizer series) 10/09/2020 (Originally  08/18/2020)   INFLUENZA VACCINE  04/10/2021 (Originally 08/11/2020)   MAMMOGRAM  08/18/2021   PNA vac Low Risk  Adult (2 of 2 - PPSV23) 03/02/2023   COLONOSCOPY (Pts 45-53yr Insurance coverage will need to be confirmed)  11/16/2023   TETANUS/TDAP  08/12/2026   DEXA SCAN  Completed   Hepatitis C Screening  Completed   Zoster Vaccines- Shingrix  Completed   HPV VACCINES  Aged Out   URINE MICROALBUMIN  Discontinued   Colonoscopy- notes was told every 10 years. HM changed to reflect every 10 years.   Lung Cancer Screening: (Low Dose CT Chest recommended if Age 67-80years, 30 pack-year currently smoking OR have quit w/in 15years.) does not qualify.   Vision Screening: Recommended annual ophthalmology exams for early detection of glaucoma and other disorders of the eye. Is the patient up to date with their annual eye exam?  Yes   Dental Screening: Recommended annual dental exams for proper oral hygiene  Community Resource Referral / Chronic Care Management: CRR required this visit?  No   CCM required this visit?  No      Plan:   Keep all routine maintenance appointments.   I have personally reviewed and noted the following in the patient's chart:   Medical and social history Use of alcohol, tobacco or illicit drugs  Current medications and supplements including opioid prescriptions. Not taking opioid.  Functional ability and status Nutritional status Physical activity Advanced directives List of other physicians Hospitalizations, surgeries, and ER visits in previous 12 months Vitals Screenings to include cognitive, depression, and falls Referrals and appointments  In addition, I have reviewed and discussed with patient certain preventive protocols, quality metrics, and best practice recommendations. A written personalized care plan for preventive services as well as general preventive health recommendations were provided to patient via mychart.     OVarney Biles  LPN   [id number]

## 2020-09-23 NOTE — Patient Instructions (Addendum)
Robyn Montgomery , Thank you for taking time to come for your Medicare Wellness Visit. I appreciate your ongoing commitment to your health goals. Please review the following plan we discussed and let me know if I can assist you in the future.   These are the goals we discussed:  Goals      Follow up with Primary Care Provider     Keep routine maintenance appointments        This is a list of the screening recommended for you and due dates:  Health Maintenance  Topic Date Due   COVID-19 Vaccine (5 - Booster for Pfizer series) 10/09/2020*   Flu Shot  04/10/2021*   Mammogram  08/18/2021   Pneumonia vaccines (2 of 2 - PPSV23) 03/02/2023   Colon Cancer Screening  11/16/2023   Tetanus Vaccine  08/12/2026   DEXA scan (bone density measurement)  Completed   Hepatitis C Screening: USPSTF Recommendation to screen - Ages 78-79 yo.  Completed   Zoster (Shingles) Vaccine  Completed   HPV Vaccine  Aged Out   Urine Protein Check  Discontinued  *Topic was postponed. The date shown is not the original due date.    Advanced directives: End of life planning; Advance aging; Advanced directives discussed.  Copy of current HCPOA/Living Will requested.    Conditions/risks identified: none new  Follow up in one year for your annual wellness visit    Preventive Care 65 Years and Older, Female Preventive care refers to lifestyle choices and visits with your health care provider that can promote health and wellness. What does preventive care include? A yearly physical exam. This is also called an annual well check. Dental exams once or twice a year. Routine eye exams. Ask your health care provider how often you should have your eyes checked. Personal lifestyle choices, including: Daily care of your teeth and gums. Regular physical activity. Eating a healthy diet. Avoiding tobacco and drug use. Limiting alcohol use. Practicing safe sex. Taking low-dose aspirin every day. Taking vitamin and mineral  supplements as recommended by your health care provider. What happens during an annual well check? The services and screenings done by your health care provider during your annual well check will depend on your age, overall health, lifestyle risk factors, and family history of disease. Counseling  Your health care provider may ask you questions about your: Alcohol use. Tobacco use. Drug use. Emotional well-being. Home and relationship well-being. Sexual activity. Eating habits. History of falls. Memory and ability to understand (cognition). Work and work Statistician. Reproductive health. Screening  You may have the following tests or measurements: Height, weight, and BMI. Blood pressure. Lipid and cholesterol levels. These may be checked every 5 years, or more frequently if you are over 71 years old. Skin check. Lung cancer screening. You may have this screening every year starting at age 28 if you have a 30-pack-year history of smoking and currently smoke or have quit within the past 15 years. Fecal occult blood test (FOBT) of the stool. You may have this test every year starting at age 31. Flexible sigmoidoscopy or colonoscopy. You may have a sigmoidoscopy every 5 years or a colonoscopy every 10 years starting at age 80. Hepatitis C blood test. Hepatitis B blood test. Sexually transmitted disease (STD) testing. Diabetes screening. This is done by checking your blood sugar (glucose) after you have not eaten for a while (fasting). You may have this done every 1-3 years. Bone density scan. This is done to screen for  osteoporosis. You may have this done starting at age 58. Mammogram. This may be done every 1-2 years. Talk to your health care provider about how often you should have regular mammograms. Talk with your health care provider about your test results, treatment options, and if necessary, the need for more tests. Vaccines  Your health care provider may recommend certain  vaccines, such as: Influenza vaccine. This is recommended every year. Tetanus, diphtheria, and acellular pertussis (Tdap, Td) vaccine. You may need a Td booster every 10 years. Zoster vaccine. You may need this after age 69. Pneumococcal 13-valent conjugate (PCV13) vaccine. One dose is recommended after age 86. Pneumococcal polysaccharide (PPSV23) vaccine. One dose is recommended after age 77. Talk to your health care provider about which screenings and vaccines you need and how often you need them. This information is not intended to replace advice given to you by your health care provider. Make sure you discuss any questions you have with your health care provider. Document Released: 01/24/2015 Document Revised: 09/17/2015 Document Reviewed: 10/29/2014 Elsevier Interactive Patient Education  2017 [place] Prevention in the Home Falls can cause injuries. They can happen to people of all ages. There are many things you can do to make your home safe and to help prevent falls. What can I do on the outside of my home? Regularly fix the edges of walkways and driveways and fix any cracks. Remove anything that might make you trip as you walk through a door, such as a raised step or threshold. Trim any bushes or trees on the path to your home. Use bright outdoor lighting. Clear any walking paths of anything that might make someone trip, such as rocks or tools. Regularly check to see if handrails are loose or broken. Make sure that both sides of any steps have handrails. Any raised decks and porches should have guardrails on the edges. Have any leaves, snow, or ice cleared regularly. Use sand or salt on walking paths during winter. Clean up any spills in your garage right away. This includes oil or grease spills. What can I do in the bathroom? Use night lights. Install grab bars by the toilet and in the tub and shower. Do not use towel bars as grab bars. Use non-skid mats or decals in  the tub or shower. If you need to sit down in the shower, use a plastic, non-slip stool. Keep the floor dry. Clean up any water that spills on the floor as soon as it happens. Remove soap buildup in the tub or shower regularly. Attach bath mats securely with double-sided non-slip rug tape. Do not have throw rugs and other things on the floor that can make you trip. What can I do in the bedroom? Use night lights. Make sure that you have a light by your bed that is easy to reach. Do not use any sheets or blankets that are too big for your bed. They should not hang down onto the floor. Have a firm chair that has side arms. You can use this for support while you get dressed. Do not have throw rugs and other things on the floor that can make you trip. What can I do in the kitchen? Clean up any spills right away. Avoid walking on wet floors. Keep items that you use a lot in easy-to-reach places. If you need to reach something above you, use a strong step stool that has a grab bar. Keep electrical cords out of the way. Do not  use floor polish or wax that makes floors slippery. If you must use wax, use non-skid floor wax. Do not have throw rugs and other things on the floor that can make you trip. What can I do with my stairs? Do not leave any items on the stairs. Make sure that there are handrails on both sides of the stairs and use them. Fix handrails that are broken or loose. Make sure that handrails are as long as the stairways. Check any carpeting to make sure that it is firmly attached to the stairs. Fix any carpet that is loose or worn. Avoid having throw rugs at the top or bottom of the stairs. If you do have throw rugs, attach them to the floor with carpet tape. Make sure that you have a light switch at the top of the stairs and the bottom of the stairs. If you do not have them, ask someone to add them for you. What else can I do to help prevent falls? Wear shoes that: Do not have high  heels. Have rubber bottoms. Are comfortable and fit you well. Are closed at the toe. Do not wear sandals. If you use a stepladder: Make sure that it is fully opened. Do not climb a closed stepladder. Make sure that both sides of the stepladder are locked into place. Ask someone to hold it for you, if possible. Clearly mark and make sure that you can see: Any grab bars or handrails. First and last steps. Where the edge of each step is. Use tools that help you move around (mobility aids) if they are needed. These include: Canes. Walkers. Scooters. Crutches. Turn on the lights when you go into a dark area. Replace any light bulbs as soon as they burn out. Set up your furniture so you have a clear path. Avoid moving your furniture around. If any of your floors are uneven, fix them. If there are any pets around you, be aware of where they are. Review your medicines with your doctor. Some medicines can make you feel dizzy. This can increase your chance of falling. Ask your doctor what other things that you can do to help prevent falls. This information is not intended to replace advice given to you by your health care provider. Make sure you discuss any questions you have with your health care provider. Document Released: 10/24/2008 Document Revised: 06/05/2015 Document Reviewed: 02/01/2014 Elsevier Interactive Patient Education  2017 [employer].

## 2020-09-26 ENCOUNTER — Other Ambulatory Visit (INDEPENDENT_AMBULATORY_CARE_PROVIDER_SITE_OTHER): Payer: PPO

## 2020-09-26 ENCOUNTER — Other Ambulatory Visit: Payer: Self-pay

## 2020-09-26 DIAGNOSIS — E785 Hyperlipidemia, unspecified: Secondary | ICD-10-CM

## 2020-09-26 LAB — COMPREHENSIVE METABOLIC PANEL
ALT: 14 U/L (ref 0–35)
AST: 16 U/L (ref 0–37)
Albumin: 4.5 g/dL (ref 3.5–5.2)
Alkaline Phosphatase: 78 U/L (ref 39–117)
BUN: 12 mg/dL (ref 6–23)
CO2: 27 mEq/L (ref 19–32)
Calcium: 10 mg/dL (ref 8.4–10.5)
Chloride: 103 mEq/L (ref 96–112)
Creatinine, Ser: 0.97 mg/dL (ref 0.40–1.20)
GFR: 60.51 mL/min (ref >60.00)
Glucose, Bld: 81 mg/dL (ref 70–99)
Potassium: 4.4 mEq/L (ref 3.5–5.1)
Sodium: 140 mEq/L (ref 135–145)
Total Bilirubin: 0.6 mg/dL (ref 0.2–1.2)
Total Protein: 6.9 g/dL (ref 6.0–8.3)

## 2020-12-01 DIAGNOSIS — H2513 Age-related nuclear cataract, bilateral: Secondary | ICD-10-CM | POA: Diagnosis not present

## 2020-12-29 ENCOUNTER — Ambulatory Visit: Payer: PPO | Admitting: Dermatology

## 2020-12-29 ENCOUNTER — Other Ambulatory Visit: Payer: Self-pay

## 2020-12-29 DIAGNOSIS — Z9481 Bone marrow transplant status: Secondary | ICD-10-CM | POA: Diagnosis not present

## 2020-12-29 DIAGNOSIS — D224 Melanocytic nevi of scalp and neck: Secondary | ICD-10-CM

## 2020-12-29 DIAGNOSIS — Z85828 Personal history of other malignant neoplasm of skin: Secondary | ICD-10-CM | POA: Diagnosis not present

## 2020-12-29 DIAGNOSIS — Z1283 Encounter for screening for malignant neoplasm of skin: Secondary | ICD-10-CM | POA: Diagnosis not present

## 2020-12-29 DIAGNOSIS — L578 Other skin changes due to chronic exposure to nonionizing radiation: Secondary | ICD-10-CM

## 2020-12-29 DIAGNOSIS — D229 Melanocytic nevi, unspecified: Secondary | ICD-10-CM

## 2020-12-29 DIAGNOSIS — L814 Other melanin hyperpigmentation: Secondary | ICD-10-CM | POA: Diagnosis not present

## 2020-12-29 DIAGNOSIS — D1800 Hemangioma unspecified site: Secondary | ICD-10-CM

## 2020-12-29 DIAGNOSIS — L821 Other seborrheic keratosis: Secondary | ICD-10-CM

## 2020-12-29 DIAGNOSIS — Z856 Personal history of leukemia: Secondary | ICD-10-CM

## 2020-12-29 DIAGNOSIS — Z86018 Personal history of other benign neoplasm: Secondary | ICD-10-CM | POA: Diagnosis not present

## 2020-12-29 NOTE — Progress Notes (Signed)
Follow-Up Visit   Subjective  Robyn Montgomery is a 65 y.o. female who presents for the following: Follow-up (Patient here today for 6 month tbse. Patient has history of bcc. ). The patient presents for Total-Body Skin Exam (TBSE) for skin cancer screening and mole check.  The patient has spots, moles and lesions to be evaluated, some may be new or changing and the patient has concerns that these could be cancer.  The following portions of the chart were reviewed this encounter and updated as appropriate:  Tobacco   Allergies   Meds   Problems   Med Hx   Surg Hx   Fam Hx      Review of Systems: No other skin or systemic complaints except as noted in HPI or Assessment and Plan.  Objective  Well appearing patient in no apparent distress; mood and affect are within normal limits.  A full examination was performed including scalp, head, eyes, ears, nose, lips, neck, chest, axillae, abdomen, back, buttocks, bilateral upper extremities, bilateral lower extremities, hands, feet, fingers, toes, fingernails, and toenails. All findings within normal limits unless otherwise noted below.  right vertex scalp near crown 1.5 cm flesh colored papule    Assessment & Plan  Nevus right vertex scalp near crown  Benign-appearing.  Observation.  Call clinic for new or changing lesions.  Recommend daily use of broad spectrum spf 30+ sunscreen to sun-exposed areas.    Skin cancer screening  Lentigines - Scattered tan macules - Due to sun exposure - Benign-appearing, observe - Recommend daily broad spectrum sunscreen SPF 30+ to sun-exposed areas, reapply every 2 hours as needed. - Call for any changes  Seborrheic Keratoses - Stuck-on, waxy, tan-brown papules and/or plaques  - Benign-appearing - Discussed benign etiology and prognosis. - Observe - Call for any changes  Melanocytic Nevi - Tan-brown and/or pink-flesh-colored symmetric macules and papules - Benign appearing on exam today -  Observation - Call clinic for new or changing moles - Recommend daily use of broad spectrum spf 30+ sunscreen to sun-exposed areas.   Hemangiomas - Red papules - Discussed benign nature - Observe - Call for any changes  Actinic Damage - Chronic condition, secondary to cumulative UV/sun exposure - diffuse scaly erythematous macules with underlying dyspigmentation - Recommend daily broad spectrum sunscreen SPF 30+ to sun-exposed areas, reapply every 2 hours as needed.  - Staying in the shade or wearing long sleeves, sun glasses (UVA+UVB protection) and wide brim hats (4-inch brim around the entire circumference of the hat) are also recommended for sun protection.  - Call for new or changing lesions.  History of Basal Cell Carcinoma of the Skin - No evidence of recurrence today - Recommend regular full body skin exams - Recommend daily broad spectrum sunscreen SPF 30+ to sun-exposed areas, reapply every 2 hours as needed.  - Call if any new or changing lesions are noted between office visits  History of Dysplastic Nevi - No evidence of recurrence today - Recommend regular full body skin exams - Recommend daily broad spectrum sunscreen SPF 30+ to sun-exposed areas, reapply every 2 hours as needed.  - Call if any new or changing lesions are noted between office visits  Skin cancer screening performed today.  History of leukemia with bone marrow transplant.  Patient is currently not on immunosuppressants. Recommend continue monitoring for neoplasms of skin.  Return in about 6 months (around 06/29/2021) for tbse history of bcc .  IRuthell Rummage, CMA, am acting as scribe  for Sarina Ser, MD. Documentation: I have reviewed the above documentation for accuracy and completeness, and I agree with the above.  Sarina Ser, MD

## 2020-12-29 NOTE — Patient Instructions (Signed)
Melanoma ABCDEs ? ?Melanoma is the most dangerous type of skin cancer, and is the leading cause of death from skin disease.  You are more likely to develop melanoma if you: ?Have light-colored skin, light-colored eyes, or red or blond hair ?Spend a lot of time in the sun ?Tan regularly, either outdoors or in a tanning bed ?Have had blistering sunburns, especially during childhood ?Have a close family member who has had a melanoma ?Have atypical moles or large birthmarks ? ?Early detection of melanoma is key since treatment is typically straightforward and cure rates are extremely high if we catch it early.  ? ?The first sign of melanoma is often a change in a mole or a new dark spot.  The ABCDE system is a way of remembering the signs of melanoma. ? ?A for asymmetry:  The two halves do not match. ?B for border:  The edges of the growth are irregular. ?C for color:  A mixture of colors are present instead of an even brown color. ?D for diameter:  Melanomas are usually (but not always) greater than 6mm - the size of a pencil eraser. ?E for evolution:  The spot keeps changing in size, shape, and color. ? ?Please check your skin once per month between visits. You can use a small mirror in front and a large mirror behind you to keep an eye on the back side or your body.  ? ?If you see any new or changing lesions before your next follow-up, please call to schedule a visit. ? ?Please continue daily skin protection including broad spectrum sunscreen SPF 30+ to sun-exposed areas, reapplying every 2 hours as needed when you're outdoors.  ? ?Staying in the shade or wearing long sleeves, sun glasses (UVA+UVB protection) and wide brim hats (4-inch brim around the entire circumference of the hat) are also recommended for sun protection.   ? ? ?If You Need Anything After Your Visit ? ?If you have any questions or concerns for your doctor, please call our main line at [phone] and press option 4 to reach your doctor's medical  assistant. If no one answers, please leave a voicemail as directed and we will return your call as soon as possible. Messages left after 4 pm will be answered the following business day.  ? ?You may also send us a message via MyChart. We typically respond to MyChart messages within 1-2 business days. ? ?For prescription refills, please ask your pharmacy to contact our office. Our fax number is [phone]. ? ?If you have an urgent issue when the clinic is closed that cannot wait until the next business day, you can page your doctor at the number below.   ? ?Please note that while we do our best to be available for urgent issues outside of office hours, we are not available 24/7.  ? ?If you have an urgent issue and are unable to reach us, you may choose to seek medical care at your doctor's office, retail clinic, urgent care center, or emergency room. ? ?If you have a medical emergency, please immediately call 911 or go to the emergency department. ? ?Pager Numbers ? ?- Dr. Kowalski: [phone] ? ?- Dr. Moye: [phone] ? ?- Dr. Stewart: [phone] ? ?In the event of inclement weather, please call our main line at [phone] for an update on the status of any delays or closures. ? ?Dermatology Medication Tips: ?Please keep the boxes that topical medications come in in order to help keep track of the instructions   about where and how to use these. Pharmacies typically print the medication instructions only on the boxes and not directly on the medication tubes.  ? ?If your medication is too expensive, please contact our office at [phone] option 4 or send us a message through MyChart.  ? ?We are unable to tell what your co-pay for medications will be in advance as this is different depending on your insurance coverage. However, we may be able to find a substitute medication at lower cost or fill out paperwork to get insurance to cover a needed medication.  ? ?If a prior authorization is required to get your  medication covered by your insurance company, please allow us 1-2 business days to complete this process. ? ?Drug prices often vary depending on where the prescription is filled and some pharmacies may offer cheaper prices. ? ?The website www.goodrx.com contains coupons for medications through different pharmacies. The prices here do not account for what the cost may be with help from insurance (it may be cheaper with your insurance), but the website can give you the price if you did not use any insurance.  ?- You can print the associated coupon and take it with your prescription to the pharmacy.  ?- You may also stop by our office during regular business hours and pick up a GoodRx coupon card.  ?- If you need your prescription sent electronically to a different pharmacy, notify our office through [facility] MyChart or by phone at [phone] option 4. ? ? ? ? ?Si Usted Necesita Algo Despu?s de Su Visita ? ?Tambi?n puede enviarnos un mensaje a trav?s de MyChart. Por lo general respondemos a los mensajes de MyChart en el transcurso de 1 a 2 d?as h?biles. ? ?Para renovar recetas, por favor pida a su farmacia que se ponga en contacto con nuestra oficina. Nuestro n?mero de fax es el [phone]. ? ?Si tiene un asunto urgente cuando la cl?nica est? cerrada y que no puede esperar hasta el siguiente d?a h?bil, puede llamar/localizar a su doctor(a) al n?mero que aparece a continuaci?n.  ? ?Por favor, tenga en cuenta que aunque hacemos todo lo posible para estar disponibles para asuntos urgentes fuera del horario de oficina, no estamos disponibles las 24 horas del d?a, los 7 d?as de la semana.  ? ?Si tiene un problema urgente y no puede comunicarse con nosotros, puede optar por buscar atenci?n m?dica  en el consultorio de su doctor(a), en una cl?nica privada, en un centro de atenci?n urgente o en una sala de emergencias. ? ?Si tiene una emergencia m?dica, por favor llame inmediatamente al 911 o vaya a la sala de  emergencias. ? ?N?meros de b?per ? ?- Dr. Kowalski: [phone] ? ?- Dra. Moye: [phone] ? ?- Dra. Stewart: [phone] ? ?En caso de inclemencias del tiempo, por favor llame a nuestra l?nea principal al [phone] para una actualizaci?n sobre el estado de cualquier retraso o cierre. ? ?Consejos para la medicaci?n en dermatolog?a: ?Por favor, guarde las cajas en las que vienen los medicamentos de uso t?pico para ayudarle a seguir las instrucciones sobre d?nde y c?mo usarlos. Las farmacias generalmente imprimen las instrucciones del medicamento s?lo en las cajas y no directamente en los tubos del medicamento.  ? ?Si su medicamento es muy caro, por favor, p?ngase en contacto con nuestra oficina llamando al [phone] y presione la opci?n 4 o env?enos un mensaje a trav?s de MyChart.  ? ?No podemos decirle cu?l ser? su copago por los medicamentos por adelantado ya que   esto es diferente dependiendo de la cobertura de su seguro. Sin embargo, es posible que podamos encontrar un medicamento sustituto a menor costo o llenar un formulario para que el seguro cubra el medicamento que se considera necesario.  ? ?Si se requiere una autorizaci?n previa para que su compa??a de seguros cubra su medicamento, por favor perm?tanos de 1 a 2 d?as h?biles para completar este proceso. ? ?Los precios de los medicamentos var?an con frecuencia dependiendo del lugar de d?nde se surte la receta y alguna farmacias pueden ofrecer precios m?s baratos. ? ?El sitio web www.goodrx.com tiene cupones para medicamentos de diferentes farmacias. Los precios aqu? no tienen en cuenta lo que podr?a costar con la ayuda del seguro (puede ser m?s barato con su seguro), pero el sitio web puede darle el precio si no utiliz? ning?n seguro.  ?- Puede imprimir el cup?n correspondiente y llevarlo con su receta a la farmacia.  ?- Tambi?n puede pasar por nuestra oficina durante el horario de atenci?n regular y recoger una tarjeta de cupones de GoodRx.  ?- Si  necesita que su receta se env?e electr?nicamente a una farmacia diferente, informe a nuestra oficina a trav?s de MyChart de [facility] o por tel?fono llamando al [phone] y presione la opci?n 4. ? ?

## 2021-01-01 ENCOUNTER — Ambulatory Visit
Admission: EM | Admit: 2021-01-01 | Discharge: 2021-01-01 | Disposition: A | Discharge status: Home / Self Care | Payer: PPO | Attending: Physician Assistant | Admitting: Physician Assistant

## 2021-01-01 ENCOUNTER — Encounter: Payer: Self-pay | Admitting: Emergency Medicine

## 2021-01-01 ENCOUNTER — Encounter: Payer: Self-pay | Admitting: Dermatology

## 2021-01-01 DIAGNOSIS — J040 Acute laryngitis: Secondary | ICD-10-CM | POA: Diagnosis not present

## 2021-01-01 DIAGNOSIS — J029 Acute pharyngitis, unspecified: Secondary | ICD-10-CM

## 2021-01-01 LAB — POCT RAPID STREP A (OFFICE): Rapid Strep A Screen: NEGATIVE

## 2021-01-01 MED ORDER — PREDNISONE 10 MG PO TABS: 20.0000 mg | ORAL_TABLET | Freq: Every day | ORAL | 0 refills | Status: AC

## 2021-01-01 NOTE — Discharge Instructions (Addendum)
Her strep was negative.  I suspect that her symptoms are related to a virus.  As long as she has been fever free and symptoms have been improving for 24 hours without medication use we assume she is not contagious.  I recommend you use a humidifier and voice rest to help improve symptoms.  I have called in prednisone 20 mg that she can take it for 3 days.  While on this that she should not take NSAIDs including aspirin, ibuprofen/Advil, naproxen/Aleve.  If she develops any high fever, shortness of breath, swelling of her throat, inability to swallow she needs to be seen immediately.

## 2021-01-01 NOTE — ED Triage Notes (Signed)
Pt c/o scratchy throat, hoarse, low grade fever x 5 days.

## 2021-01-01 NOTE — ED Provider Notes (Signed)
Robyn Montgomery    CSN: [id number] Arrival date & time: 01/01/21  1437      History   Chief Complaint Chief Complaint  Patient presents with   Hoarse   Fever    HPI Robyn Montgomery is a 65 y.o. female.   Patient presents today with a 5-day history of sore throat.  Reports low-grade fever with last episode of fever 2 to 3 days ago as well as hoarseness and mild cough.  Denies any significant nasal congestion, shortness of breath, severe cough, nausea, vomit, headache, body aches.  She is able to eat and drink normally and denies any swelling in her posterior oropharynx.  She has tried over-the-counter medications including DayQuil without improvement of symptoms.  She denies any known sick contacts.  She has taken at home COVID test that was negative.  She has had influenza and COVID-19 vaccine.  Denies any recent antibiotic use.  She denies history of allergies or asthma.  She does not smoke.  Her biggest concern is ensuring she does not have something contagious prior to the Christmas holiday.   Past Medical History:  Diagnosis Date   Basal cell carcinoma 01/20/2006   left upper back paraspinal   Basal cell carcinoma 06/22/2012   right sup med scapula, right med breast   Basal cell carcinoma 10/04/2013   right side near costal area, right chest   Basal cell carcinoma 10/07/2014   left mid back 2.0 cm lat to spine above braline   Basal cell carcinoma 05/26/2015   right sup nasolabial   Basal cell carcinoma 06/23/2016   left midline forehead   Basal cell carcinoma 12/13/2016   left sup chest, right lat elbow, right prox med calf   Basal cell carcinoma 06/13/2017   left preauricular, left medial scapula   Basal cell carcinoma 06/19/2018   left ant lat thigh, right mid pretibial   Basal cell carcinoma 12/25/2018   right sup med scapula   Basal cell carcinoma 06/28/2019   Upper back spinal   Basal cell carcinoma 06/28/2019   R forehead   Basal cell carcinoma  12/13/2019   left mid parasternal treated with EDC 01/31/20   Cancer ([place])    leukemia- post stem cell transplant   Dysplastic nevi 08/14/2019   L mid posterior thigh   Dysplastic nevus 01/16/2007   left upper gastric   Dysplastic nevus 10/04/2013   right UQA, right sacral lower back   Dysplastic nevus 06/23/2016   right prox tricep   Dysplastic nevus 12/13/2019   left low back paraspinal moderate atypia    Leukemia ([place])    Osteoporosis    Personal history of chemotherapy     Patient Active Problem List   Diagnosis Date Noted   Leukopenia 07/31/2019   Atherosclerosis of aorta ([place]) 07/31/2019   HLD (hyperlipidemia) 07/27/2019   Gastritis 05/26/2016   Secondary cardiomyopathy ([place]) 02/02/2016   Screening for breast cancer 02/02/2016   Acute myeloid leukemia ([place]) 08/26/2015   Gout 08/25/2015   Routine general medical examination at a health care facility 07/27/2014   Osteoporosis 07/27/2014    Past Surgical History:  Procedure Laterality Date   BREAST BIOPSY Left    mole removed above left nipple, no bx   broken shoulder Right 2012   Plates and screws put in   [place]   Removed from left breast   SHOULDER SURGERY      OB  History   No obstetric history on file.      Home Medications    Prior to Admission medications   Medication Sig Start Date End Date Taking? Authorizing Provider  predniSONE (DELTASONE) 10 MG tablet Take 2 tablets (20 mg total) by mouth daily for 3 days. 01/01/21 01/04/21 Yes Willow Shidler K, PA-C  ascorbic acid (VITAMIN C) 500 MG tablet Take by mouth.    [provider]  Calcium Carb-Cholecalciferol 600-800 MG-UNIT TABS Take by mouth. 03/06/19   [provider]  rosuvastatin (CRESTOR) 5 MG tablet Take 1 tablet (5 mg total) by mouth daily. 08/11/20   Burnard Hawthorne, FNP    Family History Family History  Problem Relation Age of Onset   Arthritis Mother    Hypertension Mother     Cancer Mother 17       Breast   Osteoporosis Mother    Breast cancer Mother 52   Parkinsonism Father    Heart disease Maternal Uncle    Cancer Paternal Grandfather        Oral    Social History Social History   Tobacco Use   Smoking status: Never   Smokeless tobacco: Never  Vaping Use   Vaping Use: Never used  Substance Use Topics   Alcohol use: Not Currently    Alcohol/week: 0.0 standard drinks    Comment: Rare   Drug use: No     Allergies   Dapsone, Rasburicase, Sulfasalazine, Penicillins, Sulfa antibiotics, Vancomycin, Cefepime, Morphine, Tape, and Tapentadol   Review of Systems Review of Systems  Constitutional:  Positive for activity change. Negative for appetite change, fatigue and fever (Improved).  HENT:  Positive for sore throat and voice change. Negative for congestion, ear pain, sinus pressure, sneezing and trouble swallowing.   Respiratory:  Positive for cough. Negative for shortness of breath.   Cardiovascular:  Negative for chest pain.  Gastrointestinal:  Negative for abdominal pain, diarrhea, nausea and vomiting.  Musculoskeletal:  Negative for arthralgias and myalgias.  Neurological:  Negative for dizziness, light-headedness and headaches.    Physical Exam Triage Vital Signs ED Triage Vitals [01/01/21 1603]  Enc Vitals Group     BP 137/75     Pulse Rate 93     Resp 18     Temp 99.2 F (37.3 C)     Temp Source Oral     SpO2 96 %     Weight      Height      Head Circumference      Peak Flow      Pain Score      Pain Loc      Pain Edu?      Excl. in [place]?    No data found.  Updated Vital Signs BP 137/75 (BP Location: Left Arm)    Pulse 93    Temp 99.2 F (37.3 C) (Oral)    Resp 18    SpO2 96%   Visual Acuity Right Eye Distance:   Left Eye Distance:   Bilateral Distance:    Right Eye Near:   Left Eye Near:    Bilateral Near:     Physical Exam Vitals reviewed.  Constitutional:      General: She is awake. She is not in acute  distress.    Appearance: Normal appearance. She is well-developed. She is not ill-appearing.     Comments: Very pleasant female appears stated age in no acute distress sitting comfortably in exam room  HENT:  Head: Normocephalic and atraumatic.     Right Ear: Tympanic membrane, ear canal and external ear normal. Tympanic membrane is not erythematous or bulging.     Left Ear: Tympanic membrane, ear canal and external ear normal. Tympanic membrane is not erythematous or bulging.     Nose:     Right Sinus: No maxillary sinus tenderness or frontal sinus tenderness.     Left Sinus: No maxillary sinus tenderness or frontal sinus tenderness.     Mouth/Throat:     Pharynx: Uvula midline. Posterior oropharyngeal erythema present. No oropharyngeal exudate.     Tonsils: No tonsillar exudate or tonsillar abscesses.  Cardiovascular:     Rate and Rhythm: Normal rate and regular rhythm.     Heart sounds: Normal heart sounds, S1 normal and S2 normal. No murmur heard. Pulmonary:     Effort: Pulmonary effort is normal.     Breath sounds: Normal breath sounds. No wheezing, rhonchi or rales.     Comments: Clear to auscultation bilaterally Lymphadenopathy:     Head:     Right side of head: Submandibular adenopathy present. No submental or tonsillar adenopathy.     Left side of head: Submandibular adenopathy present. No submental or tonsillar adenopathy.     Cervical: No cervical adenopathy.  Psychiatric:        Behavior: Behavior is cooperative.     UC Treatments / Results  Labs (all labs ordered are listed, but only abnormal results are displayed) Labs Reviewed  POCT RAPID STREP A (OFFICE)    EKG   Radiology No results found.  Procedures Procedures (including critical care time)  Medications Ordered in UC Medications - No data to display  Initial Impression / Assessment and Plan / UC Course  I have reviewed the triage vital signs and the nursing notes.  Pertinent labs & imaging  results that were available during my care of the patient were reviewed by me and considered in my medical decision making (see chart for details).     Vital signs and physical exam reassuring today; no indication for emergent evaluation or imaging.  Patient was tested for strep given sore throat with fever and was negative.  We discussed that symptoms are likely viral in etiology.  Patient has had negative COVID test and she is 5 days from symptom onset so flu testing would not change management and this was deferred.  Recommended she use over-the-counter medications including Tylenol, Mucinex, Flonase.  She was given a short course of low-dose prednisone (20 mg x 3 days) to help manage symptoms.  She was instructed not to take NSAIDs including aspirin, ibuprofen/Advil, naproxen/Aleve with this medication due to risk of GI bleeding.  Recommended conservative treatment measures including humidifier and voice rest.  Discussed that we typically assume she is not contagious once she has had symptom improvement and been afebrile for 24 hours without medication use.  Discussed alarm symptoms that warrant emergent evaluation.  Strict return precautions given to which she expressed understanding.  Final Clinical Impressions(s) / UC Diagnoses   Final diagnoses:  Viral pharyngitis  Laryngitis     Discharge Instructions      Her strep was negative.  I suspect that her symptoms are related to a virus.  As long as she has been fever free and symptoms have been improving for 24 hours without medication use we assume she is not contagious.  I recommend you use a humidifier and voice rest to help improve symptoms.  I have called in  prednisone 20 mg that she can take it for 3 days.  While on this that she should not take NSAIDs including aspirin, ibuprofen/Advil, naproxen/Aleve.  If she develops any high fever, shortness of breath, swelling of her throat, inability to swallow she needs to be seen immediately.       ED Prescriptions     Medication Sig Dispense Auth. Provider   predniSONE (DELTASONE) 10 MG tablet Take 2 tablets (20 mg total) by mouth daily for 3 days. 6 tablet Tyeasha Ebbs, Derry Skill, PA-C      PDMP not reviewed this encounter.   Terrilee Croak, PA-C 01/01/21 1635

## 2021-02-17 ENCOUNTER — Ambulatory Visit: Admit: 2021-02-17 | Discharge: 2021-02-17 | Payer: PRIVATE HEALTH INSURANCE

## 2021-02-17 ENCOUNTER — Ambulatory Visit
Admit: 2021-02-17 | Discharge: 2021-02-17 | Payer: PRIVATE HEALTH INSURANCE | Attending: Cardiovascular Disease | Primary: Cardiovascular Disease

## 2021-02-17 DIAGNOSIS — I427 Cardiomyopathy due to drug and external agent: Principal | ICD-10-CM

## 2021-02-17 DIAGNOSIS — Z9484 Stem cells transplant status: Principal | ICD-10-CM

## 2021-02-17 DIAGNOSIS — M810 Age-related osteoporosis without current pathological fracture: Secondary | ICD-10-CM | POA: Diagnosis not present

## 2021-02-17 DIAGNOSIS — I447 Left bundle-branch block, unspecified: Secondary | ICD-10-CM | POA: Diagnosis not present

## 2021-02-17 DIAGNOSIS — Z9221 Personal history of antineoplastic chemotherapy: Secondary | ICD-10-CM | POA: Diagnosis not present

## 2021-02-17 DIAGNOSIS — C9200 Acute myeloblastic leukemia, not having achieved remission: Secondary | ICD-10-CM | POA: Diagnosis not present

## 2021-02-17 DIAGNOSIS — Z6825 Body mass index (BMI) 25.0-25.9, adult: Secondary | ICD-10-CM | POA: Diagnosis not present

## 2021-02-17 DIAGNOSIS — I10 Essential (primary) hypertension: Secondary | ICD-10-CM | POA: Diagnosis not present

## 2021-02-17 DIAGNOSIS — Z86718 Personal history of other venous thrombosis and embolism: Secondary | ICD-10-CM | POA: Diagnosis not present

## 2021-02-17 DIAGNOSIS — Z79899 Other long term (current) drug therapy: Secondary | ICD-10-CM | POA: Diagnosis not present

## 2021-02-17 NOTE — Unmapped (Signed)
Cardio-oncology Clinic Return Patient/Consult Note    Referring Provider: Benita Gutter  [facility]   Primary Provider: Allegra Grana, NP   [address] [facility]  [place] [address]       Reason for Visit:  Christine [name] is a 66 y.o. female who returns for ongoing evaluation and management of cardiomyopathy diagnosed in the setting of treatment for AML.    Assessment & Plan:  1. Cardiomyopathy  The etiology of her cardiomyopathy is not completely certain but given the timing of its onset seems very likely related to receiving daunorubicin. Ischemia could also be considered, but she has no other risk factors for CAD and no symptoms to suggest ischemia. Her ejection fraction improved steadily and again appears normal on today's echocardiogram. She did not tolerate neurohormonal antagonists due to symptomatic hypotension. As such, and in light of the fact that she is asymptomatic with excellent functional status, there is no indication for ACE inhibitor or beta blocker at this time.  --echo in 2 years (here or through her PCP)    2. Blood pressure  Her blood pressure was marginally elevated at her last clinic visit but has been normal since then.    3. Left bundle branch block  Transient during chemotherapy    4. Acute myeloid leukemia  S/p SCT  Now nearly 5 years post SCT. Ongoing surveillance per [facility] team.      History of Present Illness:  Christine [name] returns for ongoing evaluation and management of cardiomyopathy in the setting of treatment for AML. Christine [name] has no history of heart disease of any sort. She always has been very physically active. Prior to diagnosis of AML she regularly walked 3 miles without any chest pain or shortness of breath. She underwent stem cell transplant on February 24, 2016. During treatment for AML she experienced a drop in her ejection fraction that subsequently nearly normalized.     Interval history  Today in clinic, Christine [name] reports feeling fabulous and has no complaints. She notes no limitations to ADL. She goes to the gym 5 times weekly where she does a mixture of cardiovascular, flexibility, and weight training. 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 She reports no active symptoms of heart failure. No dyspnea, orthopnea, paroxysmal nocturnal dyspnea, or LE edema. No palpitations, chest pain, dizziness, pre-syncope or syncope.  She is tolerating her medications well and is nearing 5 years cancer-free.       Cardiovascular History & Procedures:  Cath / PCI:  None  CV Surgery:  None  EP Procedures and Devices:  None    Non-Invasive Evaluation(s):  Echo:  08/28/15  ?? Normal left ventricular systolic function, ejection fraction > 55%  ?? Dilated left atrium - mild  ?? Normal right ventricular systolic function  ?? No significant valvular abnormalities  ?? Degenerative mitral valve disease  ?? Mitral regurgitation - mild    11/04/15  ?? Left ventricular hypertrophy - mild  ?? EF estimated 45-50%, with septal dyskinesis likely due to new LBBB, this is a new finding compared to echo from 08/2015  ?? Diastolic dysfunction - grade II (elevated filling pressures)  ?? Degenerative mitral valve disease  ?? Dilated left atrium - mild  ?? Aortic sclerosis  ?? Normal right ventricular systolic function    12/03/15  ?? Limited study to evaluate ventricular function  ?? Dilated left ventricle - mild  ?? Moderately decreased left ventricular  systolic function, ejection fraction 35%  ?? Diastolic dysfunction - grade I (normal filling pressures)  ?? Mitral regurgitation - mild  ?? Normal right ventricular systolic function  ?? Pericardial effusion - small    10/26/16  ?? Limited study to evaluate ventricular function  ?? Borderline left ventricular systolic function  ?? Normal right ventricular systolic function    11/29/17  ?? Limited study to evaluate ventricular function  ?? Normal left ventricular systolic function, ejection fraction 55 to 60%  ?? Diastolic dysfunction - grade I (normal filling pressures)  ?? Normal right ventricular systolic function     11/28/18    1. Limited study to assess ventricular function.    2. Normal left ventricular size and systolic function, ejection fraction 55-60%.    3. Diastolic dysfunction - grade I (normal filling pressures).    4. Normal right ventricular size and systolic function.    CT/MRI/Nuclear Tests:  None       Past Medical History:   Diagnosis Date   ??? 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 AML (acute myelogenous leukemia) (CMS-HCC)    ??? 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 DVT (deep venous thrombosis) (CMS-HCC)    ??? Hypertension    ??? Osteoporosis     Stopped medications due to interactions with chemo drugs      Oncology History Overview Note   Referring/Local Oncologist: none    Diagnosis: de novo AML with mutated FLT3    Genetics:   Karyotype/FISH: 47XX +11     Molecular Genetics: FLT3 ITD present; IDH2: c.419G>A [p.Arg140Gln]  ??  No variants detected in ASXL1, BCOR, CEBPA, DNMT3A, ETV6/TEL, EZH2, FLT3, IDH1, KIT, NPM1, NRAS, RUNX1, SF3B1, SRSF2, STAG2, TET2, TP53, U2AF1, WT1, ZRSR2    Pertinent Phenotypic data: blasts expressed CD4, CD11b, CD11c, CD33, CD34, CD64, CD117, and HLA-DR and lack expression of other informative analyzed markers including B and T-cell antigens.     Disease-specific prognostic estimation: ELN Adverse (per FLT3 allelic ratio high per estimate by molecular pathologist)       Acute myeloid leukemia (CMS-HCC)   08/29/2015 Initial Diagnosis    Acute myeloid leukemia (RAF-HCC)     09/02/2015 -  Chemotherapy    Induction therapy with daunorubicin 60 mg/m2 IV daily x 3, cytarabine 200 mg/m2/d CIV x 7 days, midostaurin 50 mg PO BID days 8-21     09/22/2015 Biopsy    50% cellular marrow with 50% blasts     09/25/2015 -  Chemotherapy    CLAG, cladribine 5 mg/m2/day days 1-5, cytarabine 2 grams/m2 IV days 1-5, GCSF days 0-5     10/23/2015 Remission    CR     11/03/2015 -  Chemotherapy    Cytarabine 1.5 g/m2 IV Q12 hrs days 1, 3, 5     11/04/2015 Adverse Reaction    Cardiomyopathy     11/28/2015 Adverse Reaction    Stenotrophomonas maltophilia CVAD infection     12/19/2015 -  Chemotherapy    Cytarabine 1.5 g/m2 IV Q12 hrs days 1, 3, 5     02/24/2016 Transplant    CTN 1301 (control arm) conditioning regimen with Busulfan and Fludarabine followed by matched sibling allo, received bone marrow product with cell dose of 3.18 x 10^6.  Past Surgical History:   Procedure Laterality Date   ??? IR INSERT PORT AGE GREATER THAN 5 YRS  12/19/2015    IR INSERT PORT AGE GREATER THAN 5 YRS 12/19/2015 Ammie Dalton, MD IMG VIR MM MMNT   ??? PR LAP,APPENDECTOMY N/A 11/08/2017    Procedure: LAPAROSCOPY SURGICAL APPENDECTOMY;  Surgeon: Suella Broad, MD;  Location: MAIN OR [facility];  Service: General Surgery   ??? PR UPPER GI ENDOSCOPY,BIOPSY N/A 05/05/2016    Procedure: UGI ENDOSCOPY; WITH BIOPSY, SINGLE OR MULTIPLE;  Surgeon: Jolyn Lent, MD;  [facility] PROCEDURES [facility];  Service: Gastroenterology   ??? SHOULDER SURGERY         Allergies:  Dapsone, Penicillins, Rasburicase, Sulfasalazine, Vancomycin, Adhesive, Amoxicillin, Cefepime, Morphine, and Tapentadol    Current Medications:  Current Outpatient Medications   Medication Sig Dispense Refill   ??? ascorbic acid, vitamin C, (VITAMIN C) 500 MG tablet Take by mouth.     ??? calcium carbonate-vitamin D3 600 mg(1,500mg ) -800 unit Tab Take 1 tablet by mouth two (2) times a day. 60 tablet 5   ??? rosuvastatin (CRESTOR) 5 MG tablet Take 1 tablet by mouth daily.       No current facility-administered medications for this visit.     Facility-Administered Medications Ordered in Other Visits Medication Dose Route Frequency Provider Last Rate Last Admin   ??? flu vacc qs2019-20 6mos up(PF) (FLUARIX, FLULAVAL, FLUZONE) 60 mcg (15 mcg x 4)/0.5 mL injection            ??? varicella-zoster gE-AS01B (PF) (SHINGRIX) 50 mcg/0.5 mL injection                Family History:  There is no family history of premature coronary artery disease or sudden cardiac death.     Social history:  Social History     Socioeconomic History   ??? Marital status: Married   Tobacco Use   ??? Smoking status: Never   ??? Smokeless tobacco: Never   Vaping Use   ??? Vaping Use: Never used   Substance and Sexual Activity   ??? Alcohol use: No   ??? 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      Highest level of education: high school       Review of Systems:  A full review of 10 systems is unremarkable except as stated in the HPI.     Physical Exam:  VITAL SIGNS:   Vitals:    02/17/21 0912   BP: 110/77   Pulse: 79   SpO2: 97%      Wt Readings from Last 3 Encounters:   02/17/21 66.7 kg (147 lb)   03/11/20 67.9 kg (149 lb 9.6 oz)   12/11/19 67.4 kg (148 lb 9.6 oz)      Today's Body mass index is 25.23 kg/m??.   GENERAL: well-appearing in no acute distress  HEENT: Normocephalic and atraumatic with anicteric sclerae    NECK: Supple, without lymphadenopathy or thyromegaly. JVP flat. There are no carotid bruits  CARDIOVASCULAR: Regular S1S2 without murmur or gallop  RESPIRATORY: Clear to auscultation without wheezes or rales.   ABDOMEN: Soft, non-tender, non-distended with audible bowel sounds. There is no organomegaly  or palpable pulsatile mass.   EXTREMITIES:  Lower extremities are warm and without edema. Distal pulses are full and symmetric.   SKIN: No rashes, ecchymosis or petechiae.  NEURO: Alert, pleasant, and appropriate. Non-focal neuro exam    Pertinent Laboratory Studies: Lab Results   Component Value Date    PRO-BNP 400.0 (H) 06/08/2017    PRO-BNP 1,120.0 (H) 11/04/2015    Creatinine 0.85 (H) 03/11/2020    Creatinine 0.88 (H) 09/11/2019    BUN 13 03/11/2020    BUN 10 09/11/2019    Potassium 3.9 03/11/2020    Potassium 3.9 09/11/2019    Potassium, Bld 3.4 06/08/2017    Magnesium 1.9 03/11/2020    Magnesium 2.1 11/24/2017    AST 18 03/11/2020    AST 18 09/11/2019    ALT 14 03/11/2020    ALT 14 09/11/2019    Total Bilirubin 0.4 03/11/2020    Total Bilirubin 0.4 09/11/2019    INR 1.06 11/24/2017    INR 1.22 04/15/2016    WBC 3.8 (L) 03/11/2020    WBC <0.1 (LL) 03/05/2016    HGB 13.1 03/11/2020    Hemoglobin 10.9 (L) 06/08/2017    HCT 39.4 03/11/2020    Platelet 297 03/11/2020       Other pertinent records were reviewed.    Pertinent Test Results from Today:  Echo (prelim, my interpretation): EF 55-60%

## 2021-03-02 DIAGNOSIS — Z9481 Bone marrow transplant status: Principal | ICD-10-CM

## 2021-03-03 ENCOUNTER — Ambulatory Visit: Admit: 2021-03-03 | Discharge: 2021-03-04 | Payer: PRIVATE HEALTH INSURANCE

## 2021-03-03 ENCOUNTER — Other Ambulatory Visit: Admit: 2021-03-03 | Discharge: 2021-03-04 | Payer: PRIVATE HEALTH INSURANCE

## 2021-03-03 DIAGNOSIS — C9201 Acute myeloblastic leukemia, in remission: Principal | ICD-10-CM

## 2021-03-03 DIAGNOSIS — Z9481 Bone marrow transplant status: Principal | ICD-10-CM

## 2021-03-03 DIAGNOSIS — D84822 Immunodeficiency due to external causes: Secondary | ICD-10-CM | POA: Diagnosis not present

## 2021-03-03 DIAGNOSIS — Z6825 Body mass index (BMI) 25.0-25.9, adult: Secondary | ICD-10-CM | POA: Diagnosis not present

## 2021-03-03 DIAGNOSIS — Z9484 Stem cells transplant status: Secondary | ICD-10-CM | POA: Diagnosis not present

## 2021-03-03 LAB — CBC W/ AUTO DIFF
BASOPHILS ABSOLUTE COUNT: 0 10*9/L (ref 0.0–0.1)
BASOPHILS RELATIVE PERCENT: 0.3 %
EOSINOPHILS ABSOLUTE COUNT: 0.1 10*9/L (ref 0.0–0.5)
EOSINOPHILS RELATIVE PERCENT: 1.6 %
HEMATOCRIT: 37.3 % (ref 34.0–44.0)
HEMOGLOBIN: 12.4 g/dL (ref 11.3–14.9)
LYMPHOCYTES ABSOLUTE COUNT: 1.2 10*9/L (ref 1.1–3.6)
LYMPHOCYTES RELATIVE PERCENT: 32.5 %
MEAN CORPUSCULAR HEMOGLOBIN CONC: 33.1 g/dL (ref 32.0–36.0)
MEAN CORPUSCULAR HEMOGLOBIN: 30.9 pg (ref 25.9–32.4)
MEAN CORPUSCULAR VOLUME: 93.3 fL (ref 77.6–95.7)
MEAN PLATELET VOLUME: 6.7 fL — ABNORMAL LOW (ref 6.8–10.7)
MONOCYTES ABSOLUTE COUNT: 0.4 10*9/L (ref 0.3–0.8)
MONOCYTES RELATIVE PERCENT: 9.8 %
NEUTROPHILS ABSOLUTE COUNT: 2.1 10*9/L (ref 1.8–7.8)
NEUTROPHILS RELATIVE PERCENT: 55.8 %
PLATELET COUNT: 262 10*9/L (ref 150–450)
RED BLOOD CELL COUNT: 4 10*12/L (ref 3.95–5.13)
RED CELL DISTRIBUTION WIDTH: 14.5 % (ref 12.2–15.2)
WBC ADJUSTED: 3.7 10*9/L (ref 3.6–11.2)

## 2021-03-03 LAB — COMPREHENSIVE METABOLIC PANEL
ALBUMIN: 4.1 g/dL (ref 3.4–5.0)
ALKALINE PHOSPHATASE: 83 U/L (ref 46–116)
ALT (SGPT): 15 U/L (ref 10–49)
ANION GAP: 6 mmol/L (ref 5–14)
AST (SGOT): 22 U/L (ref <=34)
BILIRUBIN TOTAL: 0.5 mg/dL (ref 0.3–1.2)
BLOOD UREA NITROGEN: 10 mg/dL (ref 9–23)
BUN / CREAT RATIO: 12
CALCIUM: 9.8 mg/dL (ref 8.7–10.4)
CHLORIDE: 107 mmol/L (ref 98–107)
CO2: 30 mmol/L (ref 20.0–31.0)
CREATININE: 0.81 mg/dL — ABNORMAL HIGH
EGFR CKD-EPI (2021) FEMALE: 80 mL/min/{1.73_m2} (ref >=60)
GLUCOSE RANDOM: 101 mg/dL (ref 70–179)
POTASSIUM: 4.4 mmol/L (ref 3.4–4.8)
PROTEIN TOTAL: 7 g/dL (ref 5.7–8.2)
SODIUM: 143 mmol/L (ref 135–145)

## 2021-03-03 NOTE — Unmapped (Signed)
BMT Clinic Follow-up    Patient Name: Christine [name]  MRN: [id number]  Encounter date: 03/03/2021    Primary Care Provider: Allegra Grana, NP  BMT Attending MD:  Lanae Boast, MD.   ??  HPI: Christine [name] is a 66 y.o. 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   Tacrolimus stop date 06/15/16.    ??  Interval History:  Ms. Christine [name] presents today with her husband. She is feeling well today. Denies interim infections. Denies fever, chills, n/v/d. She if off all medications. She continues to be able to perform all activities without any limitations. Both she and her husband received two doses of COVID-19 vaccine. They are both planning to receive a booster when available.     Oncology History Overview Note   Referring/Local Oncologist: none    Diagnosis: de novo AML with mutated FLT3    Genetics:   Karyotype/FISH: 47XX +11     Molecular Genetics: FLT3 ITD present; IDH2: c.419G>A [p.Arg140Gln]  ??  No variants detected in ASXL1, BCOR, CEBPA, DNMT3A, ETV6/TEL, EZH2, FLT3, IDH1, KIT, NPM1, NRAS, RUNX1, SF3B1, SRSF2, STAG2, TET2, TP53, U2AF1, WT1, ZRSR2    Pertinent Phenotypic data: blasts expressed CD4, CD11b, CD11c, CD33, CD34, CD64, CD117, and HLA-DR and lack expression of other informative analyzed markers including B and T-cell antigens.     Disease-specific prognostic estimation: ELN Adverse (per FLT3 allelic ratio high per estimate by molecular pathologist)       Acute myeloid leukemia (CMS-HCC)   08/29/2015 Initial Diagnosis    Acute myeloid leukemia (RAF-HCC)     09/02/2015 -  Chemotherapy    Induction therapy with daunorubicin 60 mg/m2 IV daily x 3, cytarabine 200 mg/m2/d CIV x 7 days, midostaurin 50 mg PO BID days 8-21     09/22/2015 Biopsy    50% cellular marrow with 50% blasts     09/25/2015 -  Chemotherapy    CLAG, cladribine 5 mg/m2/day days 1-5, cytarabine 2 grams/m2 IV days 1-5, GCSF days 0-5     10/23/2015 Remission    CR     11/03/2015 -  Chemotherapy    Cytarabine 1.5 g/m2 IV Q12 hrs days 1, 3, 5     11/04/2015 Adverse Reaction    Cardiomyopathy     11/28/2015 Adverse Reaction    Stenotrophomonas maltophilia CVAD infection     12/19/2015 -  Chemotherapy    Cytarabine 1.5 g/m2 IV Q12 hrs days 1, 3, 5     02/24/2016 Transplant    CTN 1301 (control arm) conditioning regimen with Busulfan and Fludarabine followed by matched sibling allo, received bone marrow product with cell dose of 3.18 x 10^6.        Patient Active Problem List   Diagnosis   ??? Acute deep vein thrombosis (DVT) of left lower extremity (CMS-HCC)   ??? Osteoporosis (RAF-HCC)   ??? Acute myeloid leukemia (CMS-HCC)   ??? Heart failure (CMS-HCC)   ??? 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 LLE distal DVT  Chronic PE on 09/10/15 CTA   ??? Allogeneic stem cell transplant (RAF-HCC)   ??? Hypomagnesemia   ??? Immunocompromised state (CMS-HCC)   ??? Pancytopenia (CMS-HCC)   ??? Immunization due   ??? Pneumonia of both lower lobes due to infectious organism   ??? 11/08/2017: Lap appendectomy fo Acute perforated appendicitis     Review of Systems:  A full system  review was performed and was negative except as noted in the above interval history.    **Reviewed and updated past medical, surgical, social, and family history as appropriate.      Allergies   Allergen Reactions   ??? Dapsone      Relative G6PD deficiency (lvl=5.1, lower limit of normal is 5.3)   ??? Penicillins Rash and Hives   ??? Rasburicase      Relative G6PD deficiency (lvl=5.1, lower limit of normal is 5.3)   ??? Sulfasalazine Hives   ??? Vancomycin Other (See Comments)     Avoid if possible, but if needed: Red man syndrome- slow rate over 2 hours, benadryl and ranitidine premed   ??? Adhesive Rash     Uses mepore     ??? Amoxicillin Rash   ??? Cefepime Rash     Can trial again while taking with antihistamine medications/antihistamine premeds    06/10/17: Has successfully tolerated cefepime with benadryl 25 PO as premed    ??? Morphine Rash     Can trial again while taking with antihistamine medications/antihistamine premeds (fentanyl may be better alternative if possible)   ??? Tapentadol Rash     Uses mepore     Current Outpatient Medications   Medication Sig Dispense Refill   ??? ascorbic acid, vitamin C, (VITAMIN C) 500 MG tablet Take by mouth.     ??? calcium carbonate-vitamin D3 600 mg(1,500mg ) -800 unit Tab Take 1 tablet by mouth two (2) times a day. 60 tablet 5   ??? rosuvastatin (CRESTOR) 5 MG tablet Take 1 tablet by mouth daily.       No current facility-administered medications for this visit.     Facility-Administered Medications Ordered in Other Visits   Medication Dose Route Frequency Provider Last Rate Last Admin   ??? flu vacc qs2019-20 6mos up(PF) (FLUARIX, FLULAVAL, FLUZONE) 60 mcg (15 mcg x 4)/0.5 mL injection            ??? varicella-zoster gE-AS01B (PF) (SHINGRIX) 50 mcg/0.5 mL injection              Physical Exam:  BP 125/86  - Pulse 71  - Temp 36.7 ??C (98 ??F) (Temporal)  - Resp 16  - Ht 162.6 cm (5' 4.02)  - Wt 66.6 kg (146 lb 13.2 oz)  - LMP  (LMP Unknown)  - SpO2 98%  - BMI 25.19 kg/m??   General : Well appearing, in no acute distress.   Head: Normocephalic, atraumatic.     ENT: Anicteric, Moist mucous membranes. No lesions or erythema.  Cardiovascular: Pulse normal rate, regularity and rhythm. S1 and S2 normal.  Lungs: Clear to auscultation bilaterally, without wheezes/crackles/rhonchi. Good air movement.   Skin: Warm, dry, intact. No rash noted.   Psychiatry: Alert and oriented to person, place, and time.   Abdomen : Normoactive bowel sounds, abdomen soft, non-tender   Extremeties: No edema.   Musculo Skeletal: Full range of motion in shoulder, elbow, hip knee, ankle, left hand and feet.  Neurologic: CNII-XII grossly intact. Normal strength and sensation throughout    Test Results:   Lab Results   Component Value Date    WBC 3.7 03/03/2021    HGB 12.4 03/03/2021    HCT 37.3 03/03/2021    PLT 262 03/03/2021 Lab Results   Component Value Date    NA 138 03/11/2020    K 3.9 03/11/2020    CL 103 03/11/2020    CO2 29.0 03/11/2020    BUN 13 03/11/2020  CREATININE 0.85 (H) 03/11/2020    GLU 81 03/11/2020    CALCIUM 9.3 03/11/2020    MG 1.9 03/11/2020    PHOS 4.0 11/24/2017       Lab Results   Component Value Date    BILITOT 0.4 03/11/2020    BILIDIR <0.10 04/15/2016    PROT 6.7 03/11/2020    ALBUMIN 3.9 03/11/2020    ALT 14 03/11/2020    AST 18 03/11/2020    ALKPHOS 83 03/11/2020    GGT 115 (H) 08/10/2016       Lab Results   Component Value Date    INR 1.06 11/24/2017    APTT 24.3 (L) 04/15/2016     Assessment/Plan:  **AML:??(FLT3-ITD, +11, and IDH2)??in CR prior to transplant.   06/22/16: BMBx:   - ??Normocellular bone marrow (40%) with TLH and 3% blasts.  - ??Routine cytogenetics: donor karyotype; FISH (trisomy 11) results are normal.  - ??Flow cytometric MRD results reveal no evidence of aberrant myeloid antigen expression or abnormal myeloblasts.  - ??DNA Chimerism see below.  - Considered??maintenance with FLT3 inhibitor??(sorafenib). Was reluctant to start around D+45-60 due to pancytopenia and changing chimerism. Decided against it. 10 months after transplant maintenance was discussed again - decided against it.     03/15/18: BMBx (2 years):  -  Normocellular bone marrow (40%) with TLH and 1% blasts.  - ??Routine cytogenetics: donor karyotype; FISH (trisomy 11) results are normal.  - ??Flow cytometric MRD results reveal no evidence of aberrant myeloid antigen expression or abnormal myeloblasts.  - ??DNA Chimerism see below.   ??  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 ??  Conditioning:??  ????????????????????????Busulfan PK calculated from test dose 80 mg/hr daily and Fludarabine 40 mg/m2 daily day -5 thru day -2.  ????????????????????????Day 0 -??02/24/16  Cell dose:??3.24 x10^8 TNC (Marrow)  Chimerisms/Marrow:  03/31/16: Day 30 marrow normocellular w/1% blasts. FISH and cytos are normal. MRD negative.   BM Chimerisms show 75% donor CD3 and >95% donor in unfractionated compartment.   04/22/16: PB Chimerisms show 71% donor CD3 and >95% donor unfractionated.   05/05/16: BM Chimerisms show 65% donor CD3 and >95% donor unfractionated.   05/18/16: PB Chimerisms show 74% donor CD3 and 95% donor unfractionated.   06/01/16: PB Chimerisms show 69% donor CD3 and >95% donor unfractionated.  06/22/16: PB Chimerisms show 80% donor CD3 and >95% donor unfractionated  06/22/16: BM Chimerisms show 79% donor CD3 and >95% donor unfractionated.   07/06/16: PB Chimerisms show 77% donor CD3 and >95% donor unfractionated.  07/13/16: Off tac x 4 weeks.??Chimerism check next visit.  If no full chimerism at 8-10 weeks after discontinuation of tac will consider DLI. We do not have any donor cells stored. If we need to recollect for DLI, will consider mobilizing the donor and collecting CD3+ cells for DLI as well as CD34+ cells for another transplant.   07/26/16: Off tac almost 6 weeks. PB Chimerisms show 74% donor in CD3 and >95% donor unfractionated.   08/10/16: Off tac 8 weeks. Repeat DNA shows slight increase in CD3 to 79%. Plan after discussion with Dr. Oswald Hillock to re-collect sister for more donor cells for DLI. However, today with??elevated LFTs, this is new, will need to follow these and make sure no GVHD prior to contacting sister for further donation. This was discussed with Pam today.   08/18/16: Off tac 9 weeks.??PB Chimerisms show??85% donor CD3 and >95% donor unfractionated.  08/24/16: Off  tac 10 weeks. BM Chimerisms show 79% donor CD3 and >95% donor unfractionated.  09/07/16: Off tac 12 weeks. PB Chimerisms show 91% donor CD3 and >95% donor unfractionated.??  09/28/16: PB chimerism 94% donor CD3 and >95% donor unfractionated.  10/26/16: PB chimerism 90% donor CD3 and >95% donor unfractionated.??  11/23/16: PB chimerism 95% donor CD3 and >95% donor unfractionated.??  12/22/16: PB chimerism 94% donor CD3 and >95% donor unfractionated.??  01/18/17: PB chimerism 93% donor CD3 and >95% donor unfractionated.??  02/15/17: PB chimerism 95% donor CD3 and >95% donor unfractionated.??  03/15/17: PB chimerism 95% donor CD3 and >95% donor unfractionated.??  04/12/17: PB chimerism 95% donor CD3 and >95% donor unfractionated.??  05/17/17: PB chimerism 95% donor CD3 and >95% donor unfractionated.??  06/21/17: PB chimerism >95% donor CD3 and >95% donor unfractionated.??  08/17/17:  PB chimerism >95% donor CD3 and >95% donor unfractionated.??  09/21/17: PB chimerism >95% donor CD3 and >95% donor unfractionated.??  11/30/17: PB chimerism 95% donor CD3 and >95% donor unfractionated.??  12/20/17: PB chimerism 95% donor CD3 and >95% donor unfractionated.??  03/01/18: PB chimerism 95% donor CD3 and >95% donor unfractionated.??  07/04/18: PB chimerism 94% donor CD3 and >95% donor unfractionated.??  07/12/18: PB chimerism >95% donor CD3 and >95% donor unfractionated.??  09/06/18: PB chimerism >95% donor CD3 and >95% donor unfractionated.  12/06/18: Not drawn. CBC WNL.   03/06/19: PB chimerism >95% donor CD3 and >95% donor unfractionated.  09/11/19: PB chimerism >95% donor CD3 and  95% donor unfractionated.    GVHD Prophy:??  - Methotrexate on day +1, 3, 6, 11 (received all 4 doses)  - Tac tapered and eventually DC'd 06/15/16????  - ??Has had no e/o GVHD,developed new elevated LFTs noted at visit on 7/31.   Liver US(08/18/16): 1.Patent hepatic vasculature with normal flow direction 2.Large simple hepatic cysts.3. Stone in gallbladder.   - 01/18/17: LFTs continue to improve.   - 09/11/19 - present: LFT WNL.  ??  Heme:??CBC stable.   Transfusion criteria: * Pre-med with tylenol and Zyrtec pre- blood or platelet transfusion.   - Hgb <8 and Plt <10K or bleeding.   - Granix if ANC <0.5.   - 07/04/18: ANC 1.9. Has been waxing and waning. Will repeat next week.   - 12/06/18: ANC 2.9 and ALC 1.2.  - 03/06/19: ANC 2.1 and ALC 1.0.    - 09/11/19: ANC 2.1 and ALC 1.1.  ??  - 03/11/20: ANC 2.4 and ALC 1.0.     Hx of DVT:  - Completed 6 mos or anticoagulation.     **ID:??Afeb. No evidence of active infection.   - 03/11/20: No interim infections.     Hx of acute appendicitis and serositis, complicated by abscess. Fully recovered.     Prophylaxis:   - Continue Valtrex for Zoster prophy 500 mg qd. Discontinued after received two doses of Shingrix.   - Bactrim stopped due to pancytopenia. Allergic to dapsone. Continued Pentamidine until 07/12/17.   - Vaccines as below.  ??  Low CD4  - 06/30/16: Off IS. CD4 count 126.   - 09/07/16: CD4 count 137. ??  - 09/28/16: Received Pentamidine today. Plan to check CD4 level at next visit and continue with monthly pentamidine.   - 10/26/16: CD4 count 180. Continue monthly pentamidine.   - 11/23/16: CD4 count 167. Continue monthly pentamidine.   - 12/22/16: Continue monthly pentamidine (today).  - 01/19/16: Pentamidine due today.    - 02/15/17: Repeat CD4 count 188. Continue pentamidine.   -  03/15/17: Received dose today.   - 04/12/17: Received dose today.   - 05/17/17: CD4 count 216. Received pentamidine today. Will discuss restarting Bactrim DS on Sat and Sun.  - 06/21/17: Pentamidine today. After recovery from cold will discuss restarting Bactrim DS on Sat and Sun.  - 07/12/17: Will DC pentamidine.   - 03/01/18: CD4 count 334.   ??  Viral studies:  - 11/23/16: Viral panels have been negative. Will stop monitoring unless clinically indicated.    - 03/15/17: [facility] Respiratory virus panel positive for rhinovirus.     - 06/08/17: Parainfluenza pneumonia. IgG on 5/30 was 630.   - 07/12/17: Fully recovered.   ??  Vaccine:   - 09/07/16: First set of post-transplant vaccines. Next set due in the end of February - next visit   Will also consider Shingrix - likely after her immunity is a bit better.    - 03/15/17: Delay vaccines due to cold symptoms. Will plan on the next dose of post-transplant vaccines and Shingrix next visit.   - 04/12/17: Today 12 months post transplant vaccines. First dose of Shingrix vaccine. Next set in 10/2017.   - 12/20/17:  Shingrix given today as well as flu shot.   - 02/01/18: Valtrex completed last week. S/P Shingrix X 2. No further Valtrex needed.   - 03/01/18: Completed post transplant vaccines today.  - 12/06/18: Received a Flu shot last month.   - 03/06/19: Received two doses of COVID-19 vaccine.   - 09/11/19: Planning to receive a booster when available. Also planning to receive Flu vaccine when available.    - 10/2019 Booster Pfeizer.   - 03/11/20: Evusheld.     **CV:   Hx of anthracycline induced cardiomyopathy:  - Nov 2017 w/EF 35%. This has now resolved with 01/2016 Echo showing estimated EF of 50%.   - Treated with lisinopril and metoprolol (Metoprolol 50mg  XL).   - Now off all antihypertensives due to intolerance (hypotension).   - 03/15/17: BP WNL. Asymptomatic.   - 04/12/17: Patient reported that her PCP checked her cholesterol and it was borderline high 239. She is wondering if she should take a lipid lowering agents recommended by her PCP. I advised her to discuss this with Dr. Barbette Merino whom she is scheduled to see in a few months.   - 09/11/19: She is planning to see Dr. Barbette Merino later this year - will call to make an appt.   - 03/11/20: She is following with cardiology.     ??  HTN:??Lisinopril stopped d/t AKI.   - 04/07/16: D/C'd Norvasc. Due to asymptomatic hypotension. Pre-transplant blood pressures run in the 100/70-80 range per her report.   Per Dr. Barbette Merino, ok to stop metoprolol given low BPs with plan to repeat Echo in 6-8 weeks.  - 07/06/16: Baseline ECHO today showed EF of 45-50%. DC metoprolol.   - 07/13/16: Follow BP. Will repeat ECHO 8-12 weeks after stopping metoprolol. Repeat ECHO scheduled for 10/26/2016.  - 10/26/16: Repeat ECHO showed normal EF. Ms. Surratt saw Ramond Marrow who decided not to restart ACE inhibitor or beta blocker. Recommended annual follow up with ECHO of the heart.    -03/01/18: Scheduled with Dr. Barbette Merino-  delayed due to COVID19.   - 07/04/18: Needs to reschedule follow up with Ramond Marrow.   - 12/06/18: Rinaldo Cloud saw Ramond Marrow last week.   - 03/11/20: As above.     **GI/Hepatic:??  - Completed VOD prophylaxis.   - Elevated LFTs:??Mild elevation at visit on 07/26/16. Suspect  GVHD.   ---??08/10/16 AST and ALT now 107/170 respectively. Normal amylase and lipase. No abdominal pain or GI symptoms.   ---??08/13/16 ????AST 191 and ALT 283. Alk phos 177.  ---??08/18/16 ??AST 144 and ALT 264. Alk phos 218. Liver US: with patent hepatic vasculature notable for large simple hepatic cyst and gallstones.  ---??09/07/16: AST 136 and ALT 242. Alk phos 197. Stable.   ---??09/28/16: AST 91 and ALT 159. Alk phos 128. LFTs continue to improve.   --- 10/26/16: AST 84 and ALT 139. Alk phos 114. LFTs continue to improve.   --- 11/23/16:  AST 64 and ALT 102. Alk phos 96. LFTs continue to improve.   --- 12/22/16:  AST 51 and ALT 72. Alk phos 79. LFTs continue to improve.   --- 01/18/17: AST 42 and ALT 53. Alk phos 75. LFTs continue to improve.    --- 02/15/17: AST 47 and ALT 53. Alk phos 71. Stable.   --- 06/21/17 - present: LFT WNL.   ??  **Bone Health:  - 03/31/16: DEXA scan showed osteoporosis of the spine and osteopenia of the femoral neck. Zometa and Ca/Vit D.   - 04/02/16: Received 1st dose of Zometa.   - 07/06/16. 2nd dose of Zometa. Completed. Continue Ca/Vit D supplement.   - 03/15/17: Patient received two doses of Zometa. Continue Ca/Vit D supplement. Vit D 38.9 WNL.   - 07/04/18: Will schedule Dexa scan and Zometa. Will coordinate with next visit.  - 09/06/18: Dexa scan indicative of low bone density.   - 12/06/18: Scheduled for Zometa. Complicated by significant extravasation.   - 03/06/19: Received Reclast.   - 09/11/19: Plan to repeat Dexa scan early next year to have results before determining whether or not she needs 3rd dose of Reclast. Ordered. In the meantime will continue Calcium/vit D supplementation.   - 03/11/20: Dexa scan repeat today. Osteopenia unchanged.   ??  Plan:   - 2 yr. doing well. CBC stable. In long term follow up. No transplant related issues.   Pam will follow with her PCP for all health maintenance issues.   She will contact us if we could be of any assistance in her further care.       Willaim Bane, MD  [facility]

## 2021-06-29 ENCOUNTER — Ambulatory Visit: Payer: PPO | Admitting: Dermatology

## 2021-06-29 DIAGNOSIS — Z1283 Encounter for screening for malignant neoplasm of skin: Secondary | ICD-10-CM | POA: Diagnosis not present

## 2021-06-29 DIAGNOSIS — C44719 Basal cell carcinoma of skin of left lower limb, including hip: Secondary | ICD-10-CM

## 2021-06-29 DIAGNOSIS — D485 Neoplasm of uncertain behavior of skin: Secondary | ICD-10-CM

## 2021-06-29 DIAGNOSIS — L738 Other specified follicular disorders: Secondary | ICD-10-CM

## 2021-06-29 DIAGNOSIS — L814 Other melanin hyperpigmentation: Secondary | ICD-10-CM

## 2021-06-29 DIAGNOSIS — D1800 Hemangioma unspecified site: Secondary | ICD-10-CM | POA: Diagnosis not present

## 2021-06-29 DIAGNOSIS — L578 Other skin changes due to chronic exposure to nonionizing radiation: Secondary | ICD-10-CM

## 2021-06-29 DIAGNOSIS — Z9481 Bone marrow transplant status: Secondary | ICD-10-CM

## 2021-06-29 DIAGNOSIS — Z85828 Personal history of other malignant neoplasm of skin: Secondary | ICD-10-CM

## 2021-06-29 DIAGNOSIS — L821 Other seborrheic keratosis: Secondary | ICD-10-CM

## 2021-06-29 DIAGNOSIS — Z86018 Personal history of other benign neoplasm: Secondary | ICD-10-CM | POA: Diagnosis not present

## 2021-06-29 DIAGNOSIS — D229 Melanocytic nevi, unspecified: Secondary | ICD-10-CM | POA: Diagnosis not present

## 2021-06-29 DIAGNOSIS — Z856 Personal history of leukemia: Secondary | ICD-10-CM | POA: Diagnosis not present

## 2021-06-29 NOTE — Patient Instructions (Addendum)
Wound Care Instructions  Cleanse wound gently with soap and water once a day then pat dry with clean gauze. Apply a thing coat of Petrolatum (petroleum jelly, "Vaseline") over the wound (unless you have an allergy to this). We recommend that you use a new, sterile tube of Vaseline. Do not pick or remove scabs. Do not remove the yellow or white "healing tissue" from the base of the wound.  Cover the wound with fresh, clean, nonstick gauze and secure with paper tape. You may use Band-Aids in place of gauze and tape if the would is small enough, but would recommend trimming much of the tape off as there is often too much. Sometimes Band-Aids can irritate the skin.  You should call the office for your biopsy report after 1 week if you have not already been contacted.  If you experience any problems, such as abnormal amounts of bleeding, swelling, significant bruising, significant pain, or evidence of infection, please call the office immediately.  FOR ADULT SURGERY PATIENTS: If you need something for pain relief you may take 1 extra strength Tylenol (acetaminophen) AND 2 Ibuprofen (200mg each) together every 4 hours as needed for pain. (do not take these if you are allergic to them or if you have a reason you should not take them.) Typically, you may only need pain medication for 1 to 3 days.    Due to recent changes in healthcare laws, you may see results of your pathology and/or laboratory studies on MyChart before the doctors have had a chance to review them. We understand that in some cases there may be results that are confusing or concerning to you. Please understand that not all results are received at the same time and often the doctors may need to interpret multiple results in order to provide you with the best plan of care or course of treatment. Therefore, we ask that you please give us 2 business days to thoroughly review all your results before contacting the office for clarification. Should we  see a critical lab result, you will be contacted sooner.   If You Need Anything After Your Visit  If you have any questions or concerns for your doctor, please call our main line at [phone] and press option 4 to reach your doctor's medical assistant. If no one answers, please leave a voicemail as directed and we will return your call as soon as possible. Messages left after 4 pm will be answered the following business day.   You may also send us a message via MyChart. We typically respond to MyChart messages within 1-2 business days.  For prescription refills, please ask your pharmacy to contact our office. Our fax number is [phone].  If you have an urgent issue when the clinic is closed that cannot wait until the next business day, you can page your doctor at the number below.    Please note that while we do our best to be available for urgent issues outside of office hours, we are not available 24/7.   If you have an urgent issue and are unable to reach us, you may choose to seek medical care at your doctor's office, retail clinic, urgent care center, or emergency room.  If you have a medical emergency, please immediately call 911 or go to the emergency department.  Pager Numbers  - Dr. Kowalski: [phone]  - Dr. Moye: [phone]  - Dr. Stewart: [phone]  In the event of inclement weather, please call our main line at [phone]   for an update on the status of any delays or closures.  Dermatology Medication Tips: Please keep the boxes that topical medications come in in order to help keep track of the instructions about where and how to use these. Pharmacies typically print the medication instructions only on the boxes and not directly on the medication tubes.   If your medication is too expensive, please contact our office at [phone] option 4 or send us a message through MyChart.   We are unable to tell what your co-pay for medications will be in advance  as this is different depending on your insurance coverage. However, we may be able to find a substitute medication at lower cost or fill out paperwork to get insurance to cover a needed medication.   If a prior authorization is required to get your medication covered by your insurance company, please allow us 1-2 business days to complete this process.  Drug prices often vary depending on where the prescription is filled and some pharmacies may offer cheaper prices.  The website www.goodrx.com contains coupons for medications through different pharmacies. The prices here do not account for what the cost may be with help from insurance (it may be cheaper with your insurance), but the website can give you the price if you did not use any insurance.  - You can print the associated coupon and take it with your prescription to the pharmacy.  - You may also stop by our office during regular business hours and pick up a GoodRx coupon card.  - If you need your prescription sent electronically to a different pharmacy, notify our office through [facility] MyChart or by phone at [phone] option 4.     Si Usted Necesita Algo Despus de Su Visita  Tambin puede enviarnos un mensaje a travs de MyChart. Por lo general respondemos a los mensajes de MyChart en el transcurso de 1 a 2 das hbiles.  Para renovar recetas, por favor pida a su farmacia que se ponga en contacto con nuestra oficina. Nuestro nmero de fax es el [phone].  Si tiene un asunto urgente cuando la clnica est cerrada y que no puede esperar hasta el siguiente da hbil, puede llamar/localizar a su doctor(a) al nmero que aparece a continuacin.   Por favor, tenga en cuenta que aunque hacemos todo lo posible para estar disponibles para asuntos urgentes fuera del horario de oficina, no estamos disponibles las 24 horas del da, los 7 das de la semana.   Si tiene un problema urgente y no puede comunicarse con nosotros, puede optar  por buscar atencin mdica  en el consultorio de su doctor(a), en una clnica privada, en un centro de atencin urgente o en una sala de emergencias.  Si tiene una emergencia mdica, por favor llame inmediatamente al 911 o vaya a la sala de emergencias.  Nmeros de bper  - Dr. Kowalski: [phone]  - Dra. Moye: [phone]  - Dra. Stewart: [phone]  En caso de inclemencias del tiempo, por favor llame a nuestra lnea principal al [phone] para una actualizacin sobre el estado de cualquier retraso o cierre.  Consejos para la medicacin en dermatologa: Por favor, guarde las cajas en las que vienen los medicamentos de uso tpico para ayudarle a seguir las instrucciones sobre dnde y cmo usarlos. Las farmacias generalmente imprimen las instrucciones del medicamento slo en las cajas y no directamente en los tubos del medicamento.   Si su medicamento es muy caro, por favor, pngase en contacto con nuestra   oficina llamando al [phone] y presione la opcin 4 o envenos un mensaje a travs de MyChart.   No podemos decirle cul ser su copago por los medicamentos por adelantado ya que esto es diferente dependiendo de la cobertura de su seguro. Sin embargo, es posible que podamos encontrar un medicamento sustituto a menor costo o llenar un formulario para que el seguro cubra el medicamento que se considera necesario.   Si se requiere una autorizacin previa para que su compaa de seguros cubra su medicamento, por favor permtanos de 1 a 2 das hbiles para completar este proceso.  Los precios de los medicamentos varan con frecuencia dependiendo del lugar de dnde se surte la receta y alguna farmacias pueden ofrecer precios ms baratos.  El sitio web www.goodrx.com tiene cupones para medicamentos de diferentes farmacias. Los precios aqu no tienen en cuenta lo que podra costar con la ayuda del seguro (puede ser ms barato con su seguro), pero el sitio web puede darle el precio si  no utiliz ningn seguro.  - Puede imprimir el cupn correspondiente y llevarlo con su receta a la farmacia.  - Tambin puede pasar por nuestra oficina durante el horario de atencin regular y recoger una tarjeta de cupones de GoodRx.  - Si necesita que su receta se enve electrnicamente a una farmacia diferente, informe a nuestra oficina a travs de MyChart de [place] o por telfono llamando al [phone] y presione la opcin 4.  

## 2021-06-29 NOTE — Progress Notes (Unsigned)
Follow-Up Visit   Subjective  Robyn Montgomery is a 66 y.o. female who presents for the following: Annual Exam (Hx BCC, dysplastic nevi - patient has noticed a lesion on her L pretibial that she cuts when shaving ). The patient presents for Total-Body Skin Exam (TBSE) for skin cancer screening and mole check.  The patient has spots, moles and lesions to be evaluated, some may be new or changing and the patient has concerns that these could be cancer.  The following portions of the chart were reviewed this encounter and updated as appropriate:   Tobacco  Allergies  Meds  Problems  Med Hx  Surg Hx  Fam Hx     Review of Systems:  No other skin or systemic complaints except as noted in HPI or Assessment and Plan.  Objective  Well appearing patient in no apparent distress; mood and affect are within normal limits.  A full examination was performed including scalp, head, eyes, ears, nose, lips, neck, chest, axillae, abdomen, back, buttocks, bilateral upper extremities, bilateral lower extremities, hands, feet, fingers, toes, fingernails, and toenails. All findings within normal limits unless otherwise noted below.  L pretibial 0.6 cm firm flesh colored papule.   Assessment & Plan  Neoplasm of uncertain behavior of skin L pretibial  Epidermal / dermal shaving  Lesion diameter (cm):  0.6 Informed consent: discussed and consent obtained   Timeout: patient name, date of birth, surgical site, and procedure verified   Procedure prep:  Patient was prepped and draped in usual sterile fashion Prep type:  Isopropyl alcohol Anesthesia: the lesion was anesthetized in a standard fashion   Anesthetic:  1% lidocaine w/ epinephrine 1-100,000 buffered w/ 8.4% NaHCO3 Instrument used: flexible razor blade   Hemostasis achieved with: pressure, aluminum chloride and electrodesiccation   Outcome: patient tolerated procedure well   Post-procedure details: sterile dressing applied and wound care  instructions given   Dressing type: bandage and petrolatum    Specimen 1 - Surgical pathology Differential Diagnosis: D48.5 r/o dermatofibroma vs other Check Margins: No  Skin cancer screening  Lentigines - Scattered tan macules - Due to sun exposure - Benign-appearing, observe - Recommend daily broad spectrum sunscreen SPF 30+ to sun-exposed areas, reapply every 2 hours as needed. - Call for any changes  Seborrheic Keratoses - Stuck-on, waxy, tan-brown papules and/or plaques  - Benign-appearing - Discussed benign etiology and prognosis. - Observe - Call for any changes  Melanocytic Nevi - Tan-brown and/or pink-flesh-colored symmetric macules and papules - Benign appearing on exam today - Observation - Call clinic for new or changing moles - Recommend daily use of broad spectrum spf 30+ sunscreen to sun-exposed areas.   Hemangiomas - Red papules - Discussed benign nature - Observe - Call for any changes  Actinic Damage - Chronic condition, secondary to cumulative UV/sun exposure - diffuse scaly erythematous macules with underlying dyspigmentation - Recommend daily broad spectrum sunscreen SPF 30+ to sun-exposed areas, reapply every 2 hours as needed.  - Staying in the shade or wearing long sleeves, sun glasses (UVA+UVB protection) and wide brim hats (4-inch brim around the entire circumference of the hat) are also recommended for sun protection.  - Call for new or changing lesions.  History of Basal Cell Carcinoma of the Skin - No evidence of recurrence today - Recommend regular full body skin exams - Recommend daily broad spectrum sunscreen SPF 30+ to sun-exposed areas, reapply every 2 hours as needed.  - Call if any new or changing lesions  are noted between office visits  History of Dysplastic Nevi - No evidence of recurrence today - Recommend regular full body skin exams - Recommend daily broad spectrum sunscreen SPF 30+ to sun-exposed areas, reapply every 2  hours as needed.  - Call if any new or changing lesions are noted between office visits  Sebaceous Hyperplasia - Small yellow papules with a central dell - Benign - Observe  History of leukemia with bone marrow transplant.  Patient is currently not on immunosuppressants. Recommend continue monitoring for neoplasms of skin.   Skin cancer screening performed today.  Return in about 6 months (around 12/29/2021) for TBSE.  Luther Redo, CMA, am acting as scribe for Sarina Ser, MD . Documentation: I have reviewed the above documentation for accuracy and completeness, and I agree with the above.  Sarina Ser, MD

## 2021-06-30 ENCOUNTER — Encounter: Payer: Self-pay | Admitting: Dermatology

## 2021-07-01 ENCOUNTER — Telehealth: Payer: Self-pay

## 2021-07-01 NOTE — Telephone Encounter (Signed)
-----   Message from Ralene Bathe, MD sent at 06/30/2021  7:28 PM EDT ----- Diagnosis Skin , left pretibial SUPERFICIAL AND NODULAR BASAL CELL CARCINOMA  Cancer - BCC Schedule treatment (EDC)

## 2021-07-01 NOTE — Telephone Encounter (Signed)
Advised patient of results and schedule EDC/hd

## 2021-07-31 ENCOUNTER — Other Ambulatory Visit: Payer: Self-pay

## 2021-07-31 ENCOUNTER — Encounter: Payer: Self-pay | Admitting: Family

## 2021-07-31 MED ORDER — ROSUVASTATIN CALCIUM 5 MG PO TABS: 5.0000 mg | ORAL_TABLET | Freq: Every day | ORAL | 3 refills | Status: DC

## 2021-08-05 ENCOUNTER — Ambulatory Visit: Payer: PPO | Admitting: Dermatology

## 2021-08-05 DIAGNOSIS — C44719 Basal cell carcinoma of skin of left lower limb, including hip: Secondary | ICD-10-CM | POA: Diagnosis not present

## 2021-08-05 NOTE — Progress Notes (Signed)
   Follow-Up Visit   Subjective  Robyn Montgomery is a 66 y.o. female who presents for the following: Basal Cell Carcinoma (Biopsy proven of left pretibial - EDC today).  The following portions of the chart were reviewed this encounter and updated as appropriate:   Tobacco  Allergies  Meds  Problems  Med Hx  Surg Hx  Fam Hx     Review of Systems:  No other skin or systemic complaints except as noted in HPI or Assessment and Plan.  Objective  Well appearing patient in no apparent distress; mood and affect are within normal limits.  A focused examination was performed including left lower leg. Relevant physical exam findings are noted in the Assessment and Plan.  Left pretibial Healing biopsy site   Assessment & Plan  Basal cell carcinoma (BCC) of skin of left lower extremity including hip Left pretibial  Destruction of lesion Complexity: extensive   Destruction method: electrodesiccation and curettage   Informed consent: discussed and consent obtained   Timeout:  patient name, date of birth, surgical site, and procedure verified Procedure prep:  Patient was prepped and draped in usual sterile fashion Prep type:  Isopropyl alcohol Anesthesia: the lesion was anesthetized in a standard fashion   Anesthetic:  1% lidocaine w/ epinephrine 1-100,000 buffered w/ 8.4% NaHCO3 Curettage performed in three different directions: Yes   Electrodesiccation performed over the curetted area: Yes   Lesion length (cm):  0.6 Lesion width (cm):  0.6 Margin per side (cm):  0.2 Final wound size (cm):  1 Hemostasis achieved with:  pressure and aluminum chloride Outcome: patient tolerated procedure well with no complications   Post-procedure details: sterile dressing applied and wound care instructions given   Dressing type: bandage and petrolatum     Return for Follow up as scheduled, TBSE.  I, Ashok Cordia, CMA, am acting as scribe for Sarina Ser, MD . Documentation: I have reviewed the  above documentation for accuracy and completeness, and I agree with the above.  Sarina Ser, MD

## 2021-08-05 NOTE — Patient Instructions (Signed)
Wound Care Instructions  Cleanse wound gently with soap and water once a day then pat dry with clean gauze. Apply a thing coat of Petrolatum (petroleum jelly, "Vaseline") over the wound (unless you have an allergy to this). We recommend that you use a new, sterile tube of Vaseline. Do not pick or remove scabs. Do not remove the yellow or white "healing tissue" from the base of the wound.  Cover the wound with fresh, clean, nonstick gauze and secure with paper tape. You may use Band-Aids in place of gauze and tape if the would is small enough, but would recommend trimming much of the tape off as there is often too much. Sometimes Band-Aids can irritate the skin.  You should call the office for your biopsy report after 1 week if you have not already been contacted.  If you experience any problems, such as abnormal amounts of bleeding, swelling, significant bruising, significant pain, or evidence of infection, please call the office immediately.  FOR ADULT SURGERY PATIENTS: If you need something for pain relief you may take 1 extra strength Tylenol (acetaminophen) AND 2 Ibuprofen (200mg each) together every 4 hours as needed for pain. (do not take these if you are allergic to them or if you have a reason you should not take them.) Typically, you may only need pain medication for 1 to 3 days.    Due to recent changes in healthcare laws, you may see results of your pathology and/or laboratory studies on MyChart before the doctors have had a chance to review them. We understand that in some cases there may be results that are confusing or concerning to you. Please understand that not all results are received at the same time and often the doctors may need to interpret multiple results in order to provide you with the best plan of care or course of treatment. Therefore, we ask that you please give us 2 business days to thoroughly review all your results before contacting the office for clarification. Should we  see a critical lab result, you will be contacted sooner.   If You Need Anything After Your Visit  If you have any questions or concerns for your doctor, please call our main line at [phone] and press option 4 to reach your doctor's medical assistant. If no one answers, please leave a voicemail as directed and we will return your call as soon as possible. Messages left after 4 pm will be answered the following business day.   You may also send us a message via MyChart. We typically respond to MyChart messages within 1-2 business days.  For prescription refills, please ask your pharmacy to contact our office. Our fax number is [phone].  If you have an urgent issue when the clinic is closed that cannot wait until the next business day, you can page your doctor at the number below.    Please note that while we do our best to be available for urgent issues outside of office hours, we are not available 24/7.   If you have an urgent issue and are unable to reach us, you may choose to seek medical care at your doctor's office, retail clinic, urgent care center, or emergency room.  If you have a medical emergency, please immediately call 911 or go to the emergency department.  Pager Numbers  - Dr. Kowalski: [phone]  - Dr. Moye: [phone]  - Dr. Stewart: [phone]  In the event of inclement weather, please call our main line at [phone]   for an update on the status of any delays or closures.  Dermatology Medication Tips: Please keep the boxes that topical medications come in in order to help keep track of the instructions about where and how to use these. Pharmacies typically print the medication instructions only on the boxes and not directly on the medication tubes.   If your medication is too expensive, please contact our office at [phone] option 4 or send us a message through MyChart.   We are unable to tell what your co-pay for medications will be in advance  as this is different depending on your insurance coverage. However, we may be able to find a substitute medication at lower cost or fill out paperwork to get insurance to cover a needed medication.   If a prior authorization is required to get your medication covered by your insurance company, please allow us 1-2 business days to complete this process.  Drug prices often vary depending on where the prescription is filled and some pharmacies may offer cheaper prices.  The website www.goodrx.com contains coupons for medications through different pharmacies. The prices here do not account for what the cost may be with help from insurance (it may be cheaper with your insurance), but the website can give you the price if you did not use any insurance.  - You can print the associated coupon and take it with your prescription to the pharmacy.  - You may also stop by our office during regular business hours and pick up a GoodRx coupon card.  - If you need your prescription sent electronically to a different pharmacy, notify our office through [facility] MyChart or by phone at [phone] option 4.     Si Usted Necesita Algo Despus de Su Visita  Tambin puede enviarnos un mensaje a travs de MyChart. Por lo general respondemos a los mensajes de MyChart en el transcurso de 1 a 2 das hbiles.  Para renovar recetas, por favor pida a su farmacia que se ponga en contacto con nuestra oficina. Nuestro nmero de fax es el [phone].  Si tiene un asunto urgente cuando la clnica est cerrada y que no puede esperar hasta el siguiente da hbil, puede llamar/localizar a su doctor(a) al nmero que aparece a continuacin.   Por favor, tenga en cuenta que aunque hacemos todo lo posible para estar disponibles para asuntos urgentes fuera del horario de oficina, no estamos disponibles las 24 horas del da, los 7 das de la semana.   Si tiene un problema urgente y no puede comunicarse con nosotros, puede optar  por buscar atencin mdica  en el consultorio de su doctor(a), en una clnica privada, en un centro de atencin urgente o en una sala de emergencias.  Si tiene una emergencia mdica, por favor llame inmediatamente al 911 o vaya a la sala de emergencias.  Nmeros de bper  - Dr. Kowalski: [phone]  - Dra. Moye: [phone]  - Dra. Stewart: [phone]  En caso de inclemencias del tiempo, por favor llame a nuestra lnea principal al [phone] para una actualizacin sobre el estado de cualquier retraso o cierre.  Consejos para la medicacin en dermatologa: Por favor, guarde las cajas en las que vienen los medicamentos de uso tpico para ayudarle a seguir las instrucciones sobre dnde y cmo usarlos. Las farmacias generalmente imprimen las instrucciones del medicamento slo en las cajas y no directamente en los tubos del medicamento.   Si su medicamento es muy caro, por favor, pngase en contacto con nuestra   oficina llamando al [phone] y presione la opcin 4 o envenos un mensaje a travs de MyChart.   No podemos decirle cul ser su copago por los medicamentos por adelantado ya que esto es diferente dependiendo de la cobertura de su seguro. Sin embargo, es posible que podamos encontrar un medicamento sustituto a menor costo o llenar un formulario para que el seguro cubra el medicamento que se considera necesario.   Si se requiere una autorizacin previa para que su compaa de seguros cubra su medicamento, por favor permtanos de 1 a 2 das hbiles para completar este proceso.  Los precios de los medicamentos varan con frecuencia dependiendo del lugar de dnde se surte la receta y alguna farmacias pueden ofrecer precios ms baratos.  El sitio web www.goodrx.com tiene cupones para medicamentos de diferentes farmacias. Los precios aqu no tienen en cuenta lo que podra costar con la ayuda del seguro (puede ser ms barato con su seguro), pero el sitio web puede darle el precio si  no utiliz ningn seguro.  - Puede imprimir el cupn correspondiente y llevarlo con su receta a la farmacia.  - Tambin puede pasar por nuestra oficina durante el horario de atencin regular y recoger una tarjeta de cupones de GoodRx.  - Si necesita que su receta se enve electrnicamente a una farmacia diferente, informe a nuestra oficina a travs de MyChart de [place] o por telfono llamando al [phone] y presione la opcin 4.  

## 2021-08-07 ENCOUNTER — Other Ambulatory Visit: Payer: Self-pay

## 2021-08-07 MED ORDER — ROSUVASTATIN CALCIUM 5 MG PO TABS: 5.0000 mg | ORAL_TABLET | Freq: Every day | ORAL | 3 refills | Status: DC

## 2021-08-09 ENCOUNTER — Encounter: Payer: Self-pay | Admitting: Dermatology

## 2021-08-11 ENCOUNTER — Encounter: Payer: Self-pay | Admitting: Family

## 2021-08-11 ENCOUNTER — Other Ambulatory Visit (HOSPITAL_COMMUNITY)
Admission: RE | Admit: 2021-08-11 | Discharge: 2021-08-11 | Disposition: A | Discharge status: Home / Self Care | Payer: PPO | Source: Ambulatory Visit | Attending: Family | Admitting: Family

## 2021-08-11 ENCOUNTER — Ambulatory Visit (INDEPENDENT_AMBULATORY_CARE_PROVIDER_SITE_OTHER): Payer: PPO | Admitting: Family

## 2021-08-11 VITALS — BP 122/78 | HR 87 | Temp 97.8°F | Ht 64.0 in | Wt 147.8 lb

## 2021-08-11 DIAGNOSIS — Z01419 Encounter for gynecological examination (general) (routine) without abnormal findings: Secondary | ICD-10-CM | POA: Insufficient documentation

## 2021-08-11 DIAGNOSIS — Z0000 Encounter for general adult medical examination without abnormal findings: Secondary | ICD-10-CM | POA: Diagnosis not present

## 2021-08-11 DIAGNOSIS — Z1151 Encounter for screening for human papillomavirus (HPV): Secondary | ICD-10-CM | POA: Diagnosis not present

## 2021-08-11 DIAGNOSIS — Z1231 Encounter for screening mammogram for malignant neoplasm of breast: Secondary | ICD-10-CM

## 2021-08-11 DIAGNOSIS — M47819 Spondylosis without myelopathy or radiculopathy, site unspecified: Secondary | ICD-10-CM | POA: Diagnosis not present

## 2021-08-11 LAB — COMPREHENSIVE METABOLIC PANEL
ALT: 16 U/L (ref 0–35)
AST: 16 U/L (ref 0–37)
Albumin: 4.8 g/dL (ref 3.5–5.2)
Alkaline Phosphatase: 79 U/L (ref 39–117)
BUN: 11 mg/dL (ref 6–23)
CO2: 30 mEq/L (ref 19–32)
Calcium: 10.4 mg/dL (ref 8.4–10.5)
Chloride: 103 mEq/L (ref 96–112)
Creatinine, Ser: 0.89 mg/dL (ref 0.40–1.20)
GFR: 66.68 mL/min (ref >60.00)
Glucose, Bld: 98 mg/dL (ref 70–99)
Potassium: 4.7 mEq/L (ref 3.5–5.1)
Sodium: 142 mEq/L (ref 135–145)
Total Bilirubin: 0.6 mg/dL (ref 0.2–1.2)
Total Protein: 7.3 g/dL (ref 6.0–8.3)

## 2021-08-11 LAB — LIPID PANEL
Cholesterol: 173 mg/dL (ref 0–200)
HDL: 57.4 mg/dL (ref >39.00)
LDL Cholesterol: 82 mg/dL (ref 0–99)
NonHDL: 115.99
Total CHOL/HDL Ratio: 3
Triglycerides: 171 mg/dL — ABNORMAL HIGH (ref 0.0–149.0)
VLDL: 34.2 mg/dL (ref 0.0–40.0)

## 2021-08-11 LAB — CBC WITH DIFFERENTIAL/PLATELET
Basophils Absolute: 0 10*3/uL (ref 0.0–0.1)
Basophils Relative: 0.4 % (ref 0.0–3.0)
Eosinophils Absolute: 0.1 10*3/uL (ref 0.0–0.7)
Eosinophils Relative: 2.4 % (ref 0.0–5.0)
HCT: 39.5 % (ref 36.0–46.0)
Hemoglobin: 13.2 g/dL (ref 12.0–15.0)
Lymphocytes Relative: 31.6 % (ref 12.0–46.0)
Lymphs Abs: 1.2 10*3/uL (ref 0.7–4.0)
MCHC: 33.4 g/dL (ref 30.0–36.0)
MCV: 94.1 fl (ref 78.0–100.0)
Monocytes Absolute: 0.4 10*3/uL (ref 0.1–1.0)
Monocytes Relative: 9.9 % (ref 3.0–12.0)
Neutro Abs: 2.2 10*3/uL (ref 1.4–7.7)
Neutrophils Relative %: 55.7 % (ref 43.0–77.0)
Platelets: 240 10*3/uL (ref 150.0–400.0)
RBC: 4.19 Mil/uL (ref 3.87–5.11)
RDW: 13.4 % (ref 11.5–15.5)
WBC: 3.9 10*3/uL — ABNORMAL LOW (ref 4.0–10.5)

## 2021-08-11 LAB — VITAMIN D 25 HYDROXY (VIT D DEFICIENCY, FRACTURES): VITD: 32 ng/mL (ref 30.00–100.00)

## 2021-08-11 LAB — HEMOGLOBIN A1C: Hgb A1c MFr Bld: 6 % (ref 4.6–6.5)

## 2021-08-11 LAB — TSH: TSH: 1.1 u[IU]/mL (ref 0.35–5.50)

## 2021-08-11 NOTE — Assessment & Plan Note (Addendum)
Reviewed Dr. Ricky Stabs note, gastroenterology 04/24/2019 which confirms patient is not due for colonoscopy for 10 years until 12/01/2023.  Pap performed with reassuring abdominal and pelvic exam.  Offered further evaluation of previous and resolved left lower abdominal pain.  Discussed CT abdomen and pelvis versus transvaginal ultrasound.  Patient declines further evaluation at this time and she will remain hypervigilant for symptoms and recurrence.  She will let me know how she is doing.  Encouraged continued exercise.  Mammogram is scheduled

## 2021-08-11 NOTE — Progress Notes (Signed)
Subjective:    Patient ID: Robyn Montgomery, female    DOB: 05/26/1955, 66 y.o.   MRN: [id number]  CC: Robyn Montgomery is a 66 y.o. female who presents today for physical exam.    HPI: Feels well today.  No new complaints      She continues to follow with dermatology for history of basal cell carcinoma.    She no longer follows with Dr Oretha Ellis.    colorectal Cancer Screening: UTD ; diverticulosis seen.  She did follow with [facility] clinic gastroenterology, Dr. Alice Reichert in regards to colonoscopy results 11/25/13  Breast Cancer Screening: Mammogram UTD Cervical Cancer Screening: UTD, 08/05/20; one week ago, left lower abomdinal pain. Resolved.  Denies abdominal bloating, urinary frequency, dyspareunia.   Bone Health screening/DEXA for 65+: No increased fracture risk. Defer screening at this time.       Tetanus - UTD         Labs: Screening labs today. Exercise: Gets regular exercise, 6 days per week.   Alcohol use:  rare Smoking/tobacco use: Nonsmoker.     HISTORY:  Past Medical History:  Diagnosis Date   Basal cell carcinoma 01/20/2006   left upper back paraspinal   Basal cell carcinoma 06/22/2012   right sup med scapula, right med breast   Basal cell carcinoma 10/04/2013   right side near costal area, right chest   Basal cell carcinoma 10/07/2014   left mid back 2.0 cm lat to spine above braline   Basal cell carcinoma 05/26/2015   right sup nasolabial   Basal cell carcinoma 06/23/2016   left midline forehead   Basal cell carcinoma 12/13/2016   left sup chest, right lat elbow, right prox med calf   Basal cell carcinoma 06/13/2017   left preauricular, left medial scapula   Basal cell carcinoma 06/19/2018   left ant lat thigh, right mid pretibial   Basal cell carcinoma 12/25/2018   right sup med scapula   Basal cell carcinoma 06/28/2019   Upper back spinal   Basal cell carcinoma 06/28/2019   R forehead   Basal cell carcinoma 12/13/2019   left mid parasternal  treated with [facility] 01/31/20   Cancer ([place])    leukemia- post stem cell transplant   Dysplastic nevi 08/14/2019   L mid posterior thigh   Dysplastic nevus 01/16/2007   left upper gastric   Dysplastic nevus 10/04/2013   right UQA, right sacral lower back   Dysplastic nevus 06/23/2016   right prox tricep   Dysplastic nevus 12/13/2019   left low back paraspinal moderate atypia    Leukemia ([place])    Osteoporosis    Personal history of chemotherapy     Past Surgical History:  Procedure Laterality Date   BREAST BIOPSY Left    mole removed above left nipple, no bx   broken shoulder Right 2012   Plates and screws put in   [place]   Removed from left breast   SHOULDER SURGERY     Family History  Problem Relation Age of Onset   Arthritis Mother    Hypertension Mother    Cancer Mother 42       Breast   Osteoporosis Mother    Breast cancer Mother 34   Parkinsonism Father    Heart disease Maternal Uncle    Cancer Paternal Grandfather        Oral      ALLERGIES: Dapsone, Rasburicase, Sulfasalazine, Penicillins, Sulfa  antibiotics, Vancomycin, Cefepime, Morphine, Tape, and Tapentadol  Current Outpatient Medications on File Prior to Visit  Medication Sig Dispense Refill   ascorbic acid (VITAMIN C) 500 MG tablet Take by mouth.     Calcium Carb-Cholecalciferol 600-800 MG-UNIT TABS Take by mouth.     rosuvastatin (CRESTOR) 5 MG tablet Take 1 tablet (5 mg total) by mouth daily. 90 tablet 3   No current facility-administered medications on file prior to visit.    Social History   Tobacco Use   Smoking status: Never   Smokeless tobacco: Never  Vaping Use   Vaping Use: Never used  Substance Use Topics   Alcohol use: Not Currently    Alcohol/week: 0.0 standard drinks of alcohol    Comment: Rare   Drug use: No    Review of Systems  Constitutional:  Negative for chills, fever and unexpected weight change.  HENT:  Negative for  congestion.   Respiratory:  Negative for cough.   Cardiovascular:  Negative for chest pain, palpitations and leg swelling.  Gastrointestinal:  Negative for abdominal distention, abdominal pain, nausea and vomiting.  Genitourinary:  Negative for pelvic pain.  Musculoskeletal:  Negative for arthralgias and myalgias.  Skin:  Negative for rash.  Neurological:  Negative for headaches.  Hematological:  Negative for adenopathy.  Psychiatric/Behavioral:  Negative for confusion.       Objective:    BP 122/78 (BP Location: Left Arm, Patient Position: Sitting, Cuff Size: Normal)   Pulse 87   Temp 97.8 F (36.6 C) (Oral)   Ht '5\' 4"'$  (1.626 m)   Wt 147 lb 12.8 oz (67 kg)   SpO2 99%   BMI 25.37 kg/m   BP Readings from Last 3 Encounters:  08/11/21 122/78  01/01/21 137/75  08/05/20 116/82   Wt Readings from Last 3 Encounters:  08/11/21 147 lb 12.8 oz (67 kg)  09/23/20 146 lb (66.2 kg)  08/05/20 146 lb 3.2 oz (66.3 kg)    Physical Exam Vitals reviewed.  Constitutional:      Appearance: Normal appearance. She is well-developed.  Eyes:     Conjunctiva/sclera: Conjunctivae normal.  Neck:     Thyroid: No thyroid mass or thyromegaly.  Cardiovascular:     Rate and Rhythm: Normal rate and regular rhythm.     Pulses: Normal pulses.     Heart sounds: Normal heart sounds.  Pulmonary:     Effort: Pulmonary effort is normal.     Breath sounds: Normal breath sounds. No wheezing, rhonchi or rales.  Chest:  Breasts:    Breasts are symmetrical.     Right: No inverted nipple, mass, nipple discharge, skin change or tenderness.     Left: No inverted nipple, mass, nipple discharge, skin change or tenderness.  Abdominal:     General: Bowel sounds are normal. There is no distension.     Palpations: Abdomen is soft. Abdomen is not rigid. There is no fluid wave or mass.     Tenderness: There is no abdominal tenderness. There is no guarding or rebound.  Genitourinary:    Cervix: No cervical motion  tenderness, discharge or friability.     Uterus: Not enlarged, not fixed and not tender.      Adnexa:        Right: No mass, tenderness or fullness.         Left: No mass, tenderness or fullness.       Comments: Pap performed. No CMT. Unable to appreciated ovaries. Lymphadenopathy:     Head:  Right side of head: No submental, submandibular, tonsillar, preauricular, posterior auricular or occipital adenopathy.     Left side of head: No submental, submandibular, tonsillar, preauricular, posterior auricular or occipital adenopathy.     Cervical:     Right cervical: No superficial, deep or posterior cervical adenopathy.    Left cervical: No superficial, deep or posterior cervical adenopathy.     Upper Body:     Right upper body: No pectoral adenopathy.     Left upper body: No pectoral adenopathy.  Skin:    General: Skin is warm and dry.  Neurological:     Mental Status: She is alert.  Psychiatric:        Speech: Speech normal.        Behavior: Behavior normal.        Thought Content: Thought content normal.        Assessment & Plan:   Problem List Items Addressed This Visit       Other   Routine general medical examination at a health care facility    Reviewed Dr. Ricky Stabs note, gastroenterology 04/24/2019 which confirms patient is not due for colonoscopy for 10 years until 12/01/2023.  Pap performed with reassuring abdominal and pelvic exam.  Offered further evaluation of previous and resolved left lower abdominal pain.  Discussed CT abdomen and pelvis versus transvaginal ultrasound.  Patient declines further evaluation at this time and she will remain hypervigilant for symptoms and recurrence.  She will let me know how she is doing.  Encouraged continued exercise.  Mammogram is scheduled      Relevant Orders   CBC with Differential/Platelet (Completed)   Comprehensive metabolic panel (Completed)   Cytology - PAP   Hemoglobin A1c (Completed)   Lipid panel (Completed)   TSH  (Completed)   VITAMIN D 25 Hydroxy (Vit-D Deficiency, Fractures) (Completed)   MM 3D SCREEN BREAST BILATERAL   DG Bone Density   Screening for breast cancer   Relevant Orders   MM 3D SCREEN BREAST BILATERAL   Other Visit Diagnoses     Osteoarthritis of spine without myelopathy or radiculopathy, unspecified spinal region    -  Primary   Relevant Orders   DG Bone Density        I am having Boscobel "Pam" maintain her Calcium Carb-Cholecalciferol, ascorbic acid, and rosuvastatin.   No orders of the defined types were placed in this encounter.   Return precautions given.   Risks, benefits, and alternatives of the medications and treatment plan prescribed today were discussed, and patient expressed understanding.   Education regarding symptom management and diagnosis given to patient on AVS.   Continue to follow with Burnard Hawthorne, FNP for routine health maintenance.   [place] and I agreed with plan.   Mable Paris, FNP

## 2021-08-11 NOTE — Patient Instructions (Addendum)
Please ensure that you schedule bone density and mammogram as discussed  Please let me know most certainly if left lower quadrant pain were to present or new symptoms developed right away. Nice to see you!  Health Maintenance for Postmenopausal Women Menopause is a normal process in which your ability to get pregnant comes to an end. This process happens slowly over many months or years, usually between the ages of 47 and 65. Menopause is complete when you have missed your menstrual period for 12 months. It is important to talk with your health care provider about some of the most common conditions that affect women after menopause (postmenopausal women). These include heart disease, cancer, and bone loss (osteoporosis). Adopting a healthy lifestyle and getting preventive care can help to promote your health and wellness. The actions you take can also lower your chances of developing some of these common conditions. What are the signs and symptoms of menopause? During menopause, you may have the following symptoms: Hot flashes. These can be moderate or severe. Night sweats. Decrease in sex drive. Mood swings. Headaches. Tiredness (fatigue). Irritability. Memory problems. Problems falling asleep or staying asleep. Talk with your health care provider about treatment options for your symptoms. Do I need hormone replacement therapy? Hormone replacement therapy is effective in treating symptoms that are caused by menopause, such as hot flashes and night sweats. Hormone replacement carries certain risks, especially as you become older. If you are thinking about using estrogen or estrogen with progestin, discuss the benefits and risks with your health care provider. How can I reduce my risk for heart disease and stroke? The risk of heart disease, heart attack, and stroke increases as you age. One of the causes may be a change in the body's hormones during menopause. This can affect how your body  uses dietary fats, triglycerides, and cholesterol. Heart attack and stroke are medical emergencies. There are many things that you can do to help prevent heart disease and stroke. Watch your blood pressure High blood pressure causes heart disease and increases the risk of stroke. This is more likely to develop in people who have high blood pressure readings or are overweight. Have your blood pressure checked: Every 3-5 years if you are 46-39 years of age. Every year if you are 78 years old or older. Eat a healthy diet  Eat a diet that includes plenty of vegetables, fruits, low-fat dairy products, and lean protein. Do not eat a lot of foods that are high in solid fats, added sugars, or sodium. Get regular exercise Get regular exercise. This is one of the most important things you can do for your health. Most adults should: Try to exercise for at least 150 minutes each week. The exercise should increase your heart rate and make you sweat (moderate-intensity exercise). Try to do strengthening exercises at least twice each week. Do these in addition to the moderate-intensity exercise. Spend less time sitting. Even light physical activity can be beneficial. Other tips Work with your health care provider to achieve or maintain a healthy weight. Do not use any products that contain nicotine or tobacco. These products include cigarettes, chewing tobacco, and vaping devices, such as e-cigarettes. If you need help quitting, ask your health care provider. Know your numbers. Ask your health care provider to check your cholesterol and your blood sugar (glucose). Continue to have your blood tested as directed by your health care provider. Do I need screening for cancer? Depending on your health history and family history,  you may need to have cancer screenings at different stages of your life. This may include screening for: Breast cancer. Cervical cancer. Lung cancer. Colorectal cancer. What is my risk  for osteoporosis? After menopause, you may be at increased risk for osteoporosis. Osteoporosis is a condition in which bone destruction happens more quickly than new bone creation. To help prevent osteoporosis or the bone fractures that can happen because of osteoporosis, you may take the following actions: If you are 44-64 years old, get at least 1,000 mg of calcium and at least 600 international units (IU) of vitamin D per day. If you are older than age 66 but younger than age 2, get at least 1,200 mg of calcium and at least 600 international units (IU) of vitamin D per day. If you are older than age 68, get at least 1,200 mg of calcium and at least 800 international units (IU) of vitamin D per day. Smoking and drinking excessive alcohol increase the risk of osteoporosis. Eat foods that are rich in calcium and vitamin D, and do weight-bearing exercises several times each week as directed by your health care provider. How does menopause affect my mental health? Depression may occur at any age, but it is more common as you become older. Common symptoms of depression include: Feeling depressed. Changes in sleep patterns. Changes in appetite or eating patterns. Feeling an overall lack of motivation or enjoyment of activities that you previously enjoyed. Frequent crying spells. Talk with your health care provider if you think that you are experiencing any of these symptoms. General instructions See your health care provider for regular wellness exams and vaccines. This may include: Scheduling regular health, dental, and eye exams. Getting and maintaining your vaccines. These include: Influenza vaccine. Get this vaccine each year before the flu season begins. Pneumonia vaccine. Shingles vaccine. Tetanus, diphtheria, and pertussis (Tdap) booster vaccine. Your health care provider may also recommend other immunizations. Tell your health care provider if you have ever been abused or do not feel safe  at home. Summary Menopause is a normal process in which your ability to get pregnant comes to an end. This condition causes hot flashes, night sweats, decreased interest in sex, mood swings, headaches, or lack of sleep. Treatment for this condition may include hormone replacement therapy. Take actions to keep yourself healthy, including exercising regularly, eating a healthy diet, watching your weight, and checking your blood pressure and blood sugar levels. Get screened for cancer and depression. Make sure that you are up to date with all your vaccines. This information is not intended to replace advice given to you by your health care provider. Make sure you discuss any questions you have with your health care provider. Document Revised: 05/19/2020 Document Reviewed: 05/19/2020 Elsevier Patient Education  [place].

## 2021-08-11 NOTE — Progress Notes (Signed)
FASTING LABS

## 2021-08-12 LAB — CYTOLOGY - PAP
Comment: NEGATIVE
Diagnosis: NEGATIVE
High risk HPV: NEGATIVE

## 2021-09-07 ENCOUNTER — Ambulatory Visit
Admission: RE | Admit: 2021-09-07 | Discharge: 2021-09-07 | Disposition: A | Discharge status: Home / Self Care | Payer: PPO | Source: Ambulatory Visit | Attending: Family | Admitting: Family

## 2021-09-07 DIAGNOSIS — Z1231 Encounter for screening mammogram for malignant neoplasm of breast: Secondary | ICD-10-CM | POA: Insufficient documentation

## 2021-09-07 DIAGNOSIS — Z0000 Encounter for general adult medical examination without abnormal findings: Secondary | ICD-10-CM | POA: Diagnosis not present

## 2021-09-07 DIAGNOSIS — M8589 Other specified disorders of bone density and structure, multiple sites: Secondary | ICD-10-CM | POA: Insufficient documentation

## 2021-09-07 DIAGNOSIS — M47819 Spondylosis without myelopathy or radiculopathy, site unspecified: Secondary | ICD-10-CM | POA: Insufficient documentation

## 2021-09-07 DIAGNOSIS — Z780 Asymptomatic menopausal state: Secondary | ICD-10-CM | POA: Insufficient documentation

## 2021-09-22 ENCOUNTER — Telehealth: Payer: Self-pay | Admitting: Family

## 2021-09-22 NOTE — Telephone Encounter (Signed)
Copied from [place] [id number]. Topic: Medicare AWV >> Sep 22, 2021  2:18 PM Devoria Glassing wrote: Reason for CRM: Left message for patient to schedule Annual Wellness Visit.  Please schedule with Nurse Health Advisor Denisa O'Brien-Blaney, LPN at [facility]. This appt can be telephone or office visit.  Please call [phone] ask for [facility]

## 2021-10-16 ENCOUNTER — Telehealth: Payer: Self-pay | Admitting: Family

## 2021-10-16 NOTE — Telephone Encounter (Signed)
Copied from [place]. Topic: Medicare AWV >> Oct 16, 2021  9:24 AM Devoria Glassing wrote: Reason for CRM: Left message for patient to schedule Annual Wellness Visit.  Please schedule with Nurse Health Advisor Denisa O'Brien-Blaney, LPN at [facility]. This appt can be telephone or office visit.  Please call [phone] ask for [facility]

## 2021-11-02 ENCOUNTER — Ambulatory Visit (INDEPENDENT_AMBULATORY_CARE_PROVIDER_SITE_OTHER): Payer: PPO

## 2021-11-02 VITALS — Ht 64.0 in | Wt 147.0 lb

## 2021-11-02 DIAGNOSIS — Z0000 Encounter for general adult medical examination without abnormal findings: Secondary | ICD-10-CM | POA: Diagnosis not present

## 2021-11-02 NOTE — Patient Instructions (Addendum)
Robyn Montgomery , Thank you for taking time to come for your Medicare Wellness Visit. I appreciate your ongoing commitment to your health goals. Please review the following plan we discussed and let me know if I can assist you in the future.   These are the goals we discussed:  Goals      Follow up with Primary Care Provider     Keep routine maintenance appointments Maintain healthy lifestyle        This is a list of the screening recommended for you and due dates:  Health Maintenance  Topic Date Due   Flu Shot  04/11/2022*   COVID-19 Vaccine (6 - Pfizer risk series) 12/06/2021   Pap Smear  08/12/2022   Mammogram  09/08/2022   Pneumonia Vaccine (3 - PPSV23 or PCV20) 03/02/2023   Colon Cancer Screening  11/16/2023   Tetanus Vaccine  08/12/2026   DEXA scan (bone density measurement)  Completed   Hepatitis C Screening: USPSTF Recommendation to screen - Ages 77-79 yo.  Completed   Zoster (Shingles) Vaccine  Completed   HPV Vaccine  Aged Out  *Topic was postponed. The date shown is not the original due date.    Advanced directives: End of life planning; Advance aging; Advanced directives discussed.  Copy of current HCPOA/Living Will requested.    Conditions/risks identified: none new  Next appointment: Follow up in one year for your annual wellness visit    Preventive Care 65 Years and Older, Female Preventive care refers to lifestyle choices and visits with your health care provider that can promote health and wellness. What does preventive care include? A yearly physical exam. This is also called an annual well check. Dental exams once or twice a year. Routine eye exams. Ask your health care provider how often you should have your eyes checked. Personal lifestyle choices, including: Daily care of your teeth and gums. Regular physical activity. Eating a healthy diet. Avoiding tobacco and drug use. Limiting alcohol use. Practicing safe sex. Taking low-dose aspirin every  day. Taking vitamin and mineral supplements as recommended by your health care provider. What happens during an annual well check? The services and screenings done by your health care provider during your annual well check will depend on your age, overall health, lifestyle risk factors, and family history of disease. Counseling  Your health care provider may ask you questions about your: Alcohol use. Tobacco use. Drug use. Emotional well-being. Home and relationship well-being. Sexual activity. Eating habits. History of falls. Memory and ability to understand (cognition). Work and work Statistician. Reproductive health. Screening  You may have the following tests or measurements: Height, weight, and BMI. Blood pressure. Lipid and cholesterol levels. These may be checked every 5 years, or more frequently if you are over 26 years old. Skin check. Lung cancer screening. You may have this screening every year starting at age 80 if you have a 30-pack-year history of smoking and currently smoke or have quit within the past 15 years. Fecal occult blood test (FOBT) of the stool. You may have this test every year starting at age 75. Flexible sigmoidoscopy or colonoscopy. You may have a sigmoidoscopy every 5 years or a colonoscopy every 10 years starting at age 72. Hepatitis C blood test. Hepatitis B blood test. Sexually transmitted disease (STD) testing. Diabetes screening. This is done by checking your blood sugar (glucose) after you have not eaten for a while (fasting). You may have this done every 1-3 years. Bone density scan. This is done  to screen for osteoporosis. You may have this done starting at age 6. Mammogram. This may be done every 1-2 years. Talk to your health care provider about how often you should have regular mammograms. Talk with your health care provider about your test results, treatment options, and if necessary, the need for more tests. Vaccines  Your health care  provider may recommend certain vaccines, such as: Influenza vaccine. This is recommended every year. Tetanus, diphtheria, and acellular pertussis (Tdap, Td) vaccine. You may need a Td booster every 10 years. Zoster vaccine. You may need this after age 70. Pneumococcal 13-valent conjugate (PCV13) vaccine. One dose is recommended after age 64. Pneumococcal polysaccharide (PPSV23) vaccine. One dose is recommended after age 56. Talk to your health care provider about which screenings and vaccines you need and how often you need them. This information is not intended to replace advice given to you by your health care provider. Make sure you discuss any questions you have with your health care provider. Document Released: 01/24/2015 Document Revised: 09/17/2015 Document Reviewed: 10/29/2014 Elsevier Interactive Patient Education  2017 [place] Prevention in the Home Falls can cause injuries. They can happen to people of all ages. There are many things you can do to make your home safe and to help prevent falls. What can I do on the outside of my home? Regularly fix the edges of walkways and driveways and fix any cracks. Remove anything that might make you trip as you walk through a door, such as a raised step or threshold. Trim any bushes or trees on the path to your home. Use bright outdoor lighting. Clear any walking paths of anything that might make someone trip, such as rocks or tools. Regularly check to see if handrails are loose or broken. Make sure that both sides of any steps have handrails. Any raised decks and porches should have guardrails on the edges. Have any leaves, snow, or ice cleared regularly. Use sand or salt on walking paths during winter. Clean up any spills in your garage right away. This includes oil or grease spills. What can I do in the bathroom? Use night lights. Install grab bars by the toilet and in the tub and shower. Do not use towel bars as grab  bars. Use non-skid mats or decals in the tub or shower. If you need to sit down in the shower, use a plastic, non-slip stool. Keep the floor dry. Clean up any water that spills on the floor as soon as it happens. Remove soap buildup in the tub or shower regularly. Attach bath mats securely with double-sided non-slip rug tape. Do not have throw rugs and other things on the floor that can make you trip. What can I do in the bedroom? Use night lights. Make sure that you have a light by your bed that is easy to reach. Do not use any sheets or blankets that are too big for your bed. They should not hang down onto the floor. Have a firm chair that has side arms. You can use this for support while you get dressed. Do not have throw rugs and other things on the floor that can make you trip. What can I do in the kitchen? Clean up any spills right away. Avoid walking on wet floors. Keep items that you use a lot in easy-to-reach places. If you need to reach something above you, use a strong step stool that has a grab bar. Keep electrical cords out of the  way. Do not use floor polish or wax that makes floors slippery. If you must use wax, use non-skid floor wax. Do not have throw rugs and other things on the floor that can make you trip. What can I do with my stairs? Do not leave any items on the stairs. Make sure that there are handrails on both sides of the stairs and use them. Fix handrails that are broken or loose. Make sure that handrails are as long as the stairways. Check any carpeting to make sure that it is firmly attached to the stairs. Fix any carpet that is loose or worn. Avoid having throw rugs at the top or bottom of the stairs. If you do have throw rugs, attach them to the floor with carpet tape. Make sure that you have a light switch at the top of the stairs and the bottom of the stairs. If you do not have them, ask someone to add them for you. What else can I do to help prevent  falls? Wear shoes that: Do not have high heels. Have rubber bottoms. Are comfortable and fit you well. Are closed at the toe. Do not wear sandals. If you use a stepladder: Make sure that it is fully opened. Do not climb a closed stepladder. Make sure that both sides of the stepladder are locked into place. Ask someone to hold it for you, if possible. Clearly mark and make sure that you can see: Any grab bars or handrails. First and last steps. Where the edge of each step is. Use tools that help you move around (mobility aids) if they are needed. These include: Canes. Walkers. Scooters. Crutches. Turn on the lights when you go into a dark area. Replace any light bulbs as soon as they burn out. Set up your furniture so you have a clear path. Avoid moving your furniture around. If any of your floors are uneven, fix them. If there are any pets around you, be aware of where they are. Review your medicines with your doctor. Some medicines can make you feel dizzy. This can increase your chance of falling. Ask your doctor what other things that you can do to help prevent falls. This information is not intended to replace advice given to you by your health care provider. Make sure you discuss any questions you have with your health care provider. Document Released: 10/24/2008 Document Revised: 06/05/2015 Document Reviewed: 02/01/2014 Elsevier Interactive Patient Education  2017 [employer].

## 2021-11-02 NOTE — Progress Notes (Signed)
Subjective:   Robyn Montgomery is a 66 y.o. female who presents for Medicare Annual (Subsequent) preventive examination.  Review of Systems    No ROS.  Medicare Wellness Virtual Visit.  Visual/audio telehealth visit, UTA vital signs.   See social history for additional risk factors.   Cardiac Risk Factors include: advanced age (>58mn, >>77women)     Objective:    Today's Vitals   11/02/21 1307  Weight: 147 lb (66.7 kg)  Height: '5\' 4"'  (1.626 m)   Body mass index is 25.23 kg/m.     11/02/2021    1:10 PM 09/23/2020   12:39 PM 09/13/2019    9:54 AM 01/30/2018    5:03 PM 08/26/2015    9:37 PM 08/26/2015    2:13 PM  Advanced Directives  Does Patient Have a Medical Advance Directive? Yes Yes Yes Yes Yes Yes  Type of AParamedicof ABrightLiving will [place]Living will H[place]Living will [place]Living will Healthcare Power of [place] Does patient want to make changes to medical advance directive? No - Patient declined No - Patient declined No - Patient declined   No - Patient declined  Copy of [place] Chart? No - copy requested No - copy requested No - copy requested  No - copy requested No - copy requested    Current Medications (verified) Outpatient Encounter Medications as of 11/02/2021  Medication Sig   ascorbic acid (VITAMIN C) 500 MG tablet Take by mouth.   Calcium Carb-Cholecalciferol 600-800 MG-UNIT TABS Take by mouth.   rosuvastatin (CRESTOR) 5 MG tablet Take 1 tablet (5 mg total) by mouth daily.   No facility-administered encounter medications on file as of 11/02/2021.    Allergies (verified) Dapsone, Rasburicase, Sulfasalazine, Penicillins, Sulfa antibiotics, Vancomycin, Cefepime, Morphine, Tape, and Tapentadol   History: Past Medical History:  Diagnosis Date   Basal cell carcinoma 01/20/2006   left upper back paraspinal    Basal cell carcinoma 06/22/2012   right sup med scapula, right med breast   Basal cell carcinoma 10/04/2013   right side near costal area, right chest   Basal cell carcinoma 10/07/2014   left mid back 2.0 cm lat to spine above braline   Basal cell carcinoma 05/26/2015   right sup nasolabial   Basal cell carcinoma 06/23/2016   left midline forehead   Basal cell carcinoma 12/13/2016   left sup chest, right lat elbow, right prox med calf   Basal cell carcinoma 06/13/2017   left preauricular, left medial scapula   Basal cell carcinoma 06/19/2018   left ant lat thigh, right mid pretibial   Basal cell carcinoma 12/25/2018   right sup med scapula   Basal cell carcinoma 06/28/2019   Upper back spinal   Basal cell carcinoma 06/28/2019   R forehead   Basal cell carcinoma 12/13/2019   left mid parasternal treated with EDC 01/31/20   Cancer ([place]    leukemia- post stem cell transplant   Dysplastic nevi 08/14/2019   L mid posterior thigh   Dysplastic nevus 01/16/2007   left upper gastric   Dysplastic nevus 10/04/2013   right UQA, right sacral lower back   Dysplastic nevus 06/23/2016   right prox tricep   Dysplastic nevus 12/13/2019   left low back paraspinal moderate atypia    [place]ukemia ([place]    Osteoporosis    Personal history of chemotherapy    Past Surgical  History:  Procedure Laterality Date   BREAST BIOPSY Left    mole removed above left nipple, no bx   broken shoulder Right 2012   Plates and screws put in   [place]   Removed from left breast   SHOULDER SURGERY     Family History  Problem Relation Age of Onset   Arthritis Mother    Hypertension Mother    Cancer Mother 16       Breast   Osteoporosis Mother    Breast cancer Mother 53   Parkinsonism Father    Heart disease Maternal Uncle    Cancer Paternal Grandfather        Oral   Social History   Socioeconomic History   Marital status: Married    Spouse name: Not  on file   Number of children: Not on file   Years of education: Not on file   Highest education level: Not on file  Occupational History   Not on file  Tobacco Use   Smoking status: Never   Smokeless tobacco: Never  Vaping Use   Vaping Use: Never used  Substance and Sexual Activity   Alcohol use: Not Currently    Alcohol/week: 0.0 standard drinks of alcohol    Comment: Rare   Drug use: No   Sexual activity: Yes    Partners: Male    Comment: Husband   Other Topics Concern   Not on file  Social History Narrative   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    married   No children    No pets    Right handed    Caffeine- Rare    Enjoys her new home, exercising, and cooking    Social Determinants of Health   Financial Resource Strain: Low Risk  (11/02/2021)   Overall Financial Resource Strain (CARDIA)    Difficulty of Paying Living Expenses: Not hard at all  Food Insecurity: No Food Insecurity (11/02/2021)   Hunger Vital Sign    Worried About Running Out of Food in the Last Year: Never true    [place] in the Last Year: Never true  Transportation Needs: No Transportation Needs (11/02/2021)   PRAPARE - Hydrologist (Medical): No    Lack of Transportation (Non-Medical): No  Physical Activity: Sufficiently Active (11/02/2021)   Exercise Vital Sign    Days of Exercise per Week: 5 days    Minutes of Exercise per Session: 60 min  Stress: No Stress Concern Present (11/02/2021)   [place]    Feeling of Stress : Not at all  Social Connections: Unknown (11/02/2021)   Social Connection and Isolation Panel [NHANES]    Frequency of Communication with Friends and Family: Not on file    Frequency of Social Gatherings with Friends and Family: Not on file    Attends Religious Services: Not on file    Active Member of Clubs or Organizations: Not on file    Attends Theatre manager Meetings: Not on file    Marital Status: Married    Tobacco Counseling Counseling given: Not Answered   Clinical Intake:  Pre-visit preparation completed: Yes        Diabetes: No  How often do you need to have someone help you when you read instructions, pamphlets, or other written materials from your doctor or pharmacy?: 1 -  Never  Interpreter Needed?: No      Activities of Daily Living    11/02/2021    1:06 PM  In your present state of health, do you have any difficulty performing the following activities:  Hearing? 0  Vision? 0  Difficulty concentrating or making decisions? 0  Walking or climbing stairs? 0  Dressing or bathing? 0  Doing errands, shopping? 0  Preparing Food and eating ? N  Using the Toilet? N  In the past six months, have you accidently leaked urine? N  Do you have problems with loss of bowel control? N  Managing your Medications? N  Managing your Finances? N  Housekeeping or managing your Housekeeping? N    Patient Care Team: Burnard Hawthorne, FNP as PCP - General (Family Medicine)  Indicate any recent Medical Services you may have received from other than [facility] providers in the past year (date may be approximate).     Assessment:   This is a routine wellness examination for Robyn Montgomery.  I connected with  Robyn Montgomery on 11/02/21 by a audio enabled telemedicine application and verified that I am speaking with the correct person using two identifiers.  Patient Location: Home  Provider Location: Office/Clinic  I discussed the limitations of evaluation and management by telemedicine. The patient expressed understanding and agreed to proceed.   Hearing/Vision screen Hearing Screening - Comments:: Patient is able to hear conversational tones without difficulty. No issues reported. Vision Screening - Comments:: Followed by [facility] Wears contact lenses.  They have seen their ophthalmologist in the last 12 months.    Dietary issues and exercise activities discussed: Current Exercise Habits: Structured exercise class, Type of exercise: walking;yoga (Pilates), Time (Minutes): > 60, Frequency (Times/Week): 6, Weekly Exercise (Minutes/Week): 0, Intensity: Moderate   Goals Addressed             This Visit's Progress    Follow up with Primary Care Provider   On track    Keep routine maintenance appointments Maintain healthy lifestyle       Depression Screen    11/02/2021    1:09 PM 08/11/2021   10:31 AM 09/23/2020   12:38 PM 09/13/2019    9:55 AM 07/31/2019   11:47 AM 07/26/2018    2:58 PM 02/02/2016    9:13 AM  PHQ 2/9 Scores  PHQ - 2 Score 0 0 0 0 0 0 0  PHQ- 9 Score    0 0      Fall Risk    11/02/2021    1:10 PM 08/11/2021   10:31 AM 09/23/2020   12:40 PM 09/13/2019    9:55 AM 07/31/2019   11:03 AM  Fall Risk   Falls in the past year? 0 0 0 0 1  Comment    No falls since last reported   Number falls in past yr: 0 0   0  Injury with Fall? 0 0 0  1  Risk for fall due to : No Fall Risks No Fall Risks     Follow up Falls evaluation completed Falls evaluation completed Falls evaluation completed Falls evaluation completed Falls evaluation completed    [place]: Home free of loose throw rugs in walkways, pet beds, electrical cords, etc? Yes  Adequate lighting in your home to reduce risk of falls? Yes   ASSISTIVE DEVICES UTILIZED TO PREVENT FALLS: Life alert? No  Use of a cane, walker or w/c? No   TIMED UP  AND GO: Was the test performed? No .   Cognitive Function:  Patient is alert and oriented x3.  100% independent.  Denies difficulty with memory loss, focusing, making decisions.  Manages her own medications and finances      11/02/2021    1:11 PM  6CIT Screen  What Year? 0 points  What month? 0 points  What time? 0 points  Months in reverse 0 points    Immunizations Immunization History  Administered Date(s) Administered   DTaP / Hep B  / IPV 09/07/2016, 04/12/2017, 03/01/2018   Dtap, Unspecified 05/11/2008   HIB (PRP-T) 09/07/2016, 04/12/2017, 03/01/2018   Influenza,inj,Quad PF,6+ Mos 10/29/2015, 10/26/2016, 12/20/2017   MMR 03/01/2018   PFIZER Comirnaty(Gray Top)Covid-19 Tri-Sucrose Vaccine 10/12/2019, 04/18/2020   PFIZER(Purple Top)SARS-COV-2 Vaccination 02/02/2019, 02/22/2019, 10/11/2021   Pneumococcal Conjugate-13 10/29/2015, 09/07/2016, 04/12/2017   Pneumococcal Polysaccharide-23 03/01/2018   Zoster Recombinat (Shingrix) 04/12/2017, 12/20/2017   Screening Tests Health Maintenance  Topic Date Due   INFLUENZA VACCINE  04/11/2022 (Originally 08/11/2021)   COVID-19 Vaccine (6 - Pfizer risk series) 12/06/2021   PAP SMEAR-Modifier  08/12/2022   MAMMOGRAM  09/08/2022   Pneumonia Vaccine 90+ Years old (3 - PPSV23 or PCV20) 03/02/2023   COLONOSCOPY (Pts 45-55yr Insurance coverage will need to be confirmed)  11/16/2023   TETANUS/TDAP  08/12/2026   DEXA SCAN  Completed   Hepatitis C Screening  Completed   Zoster Vaccines- Shingrix  Completed   HPV VACCINES  Aged Out   Health Maintenance There are no preventive care reminders to display for this patient.  Lung Cancer Screening: (Low Dose CT Chest recommended if Age 68-80years, 30 pack-year currently smoking OR have quit w/in 15years.) does not qualify.   Hepatitis C Screening: Completed 2019.  Vision Screening: Recommended annual ophthalmology exams for early detection of glaucoma and other disorders of the eye.  Dental Screening: Recommended annual dental exams for proper oral hygiene.  Community Resource Referral / Chronic Care Management: CRR required this visit?  No   CCM required this visit?  No      Plan:     I have personally reviewed and noted the following in the patient's chart:   Medical and social history Use of alcohol, tobacco or illicit drugs  Current medications and supplements including opioid prescriptions. Patient is not currently  taking opioid prescriptions. Functional ability and status Nutritional status Physical activity Advanced directives List of other physicians Hospitalizations, surgeries, and ER visits in previous 12 months Vitals Screenings to include cognitive, depression, and falls Referrals and appointments  In addition, I have reviewed and discussed with patient certain preventive protocols, quality metrics, and best practice recommendations. A written personalized care plan for preventive services as well as general preventive health recommendations were provided to patient.     DLeta Jungling LPN   [id number]

## 2022-01-25 ENCOUNTER — Ambulatory Visit: Payer: PPO | Admitting: Dermatology

## 2022-01-25 ENCOUNTER — Encounter: Payer: Self-pay | Admitting: Dermatology

## 2022-01-25 VITALS — BP 131/78 | HR 102

## 2022-01-25 DIAGNOSIS — D1801 Hemangioma of skin and subcutaneous tissue: Secondary | ICD-10-CM

## 2022-01-25 DIAGNOSIS — D229 Melanocytic nevi, unspecified: Secondary | ICD-10-CM

## 2022-01-25 DIAGNOSIS — L578 Other skin changes due to chronic exposure to nonionizing radiation: Secondary | ICD-10-CM | POA: Diagnosis not present

## 2022-01-25 DIAGNOSIS — Z85828 Personal history of other malignant neoplasm of skin: Secondary | ICD-10-CM

## 2022-01-25 DIAGNOSIS — L814 Other melanin hyperpigmentation: Secondary | ICD-10-CM

## 2022-01-25 DIAGNOSIS — Z1283 Encounter for screening for malignant neoplasm of skin: Secondary | ICD-10-CM | POA: Diagnosis not present

## 2022-01-25 DIAGNOSIS — L821 Other seborrheic keratosis: Secondary | ICD-10-CM | POA: Diagnosis not present

## 2022-01-25 DIAGNOSIS — Z86018 Personal history of other benign neoplasm: Secondary | ICD-10-CM | POA: Diagnosis not present

## 2022-01-25 NOTE — Patient Instructions (Signed)
Recommend daily broad spectrum sunscreen SPF 30+ to sun-exposed areas, reapply every 2 hours as needed. Call for new or changing lesions.  Staying in the shade or wearing long sleeves, sun glasses (UVA+UVB protection) and wide brim hats (4-inch brim around the entire circumference of the hat) are also recommended for sun protection.    Melanoma ABCDEs  Melanoma is the most dangerous type of skin cancer, and is the leading cause of death from skin disease.  You are more likely to develop melanoma if you: Have light-colored skin, light-colored eyes, or red or blond hair Spend a lot of time in the sun Tan regularly, either outdoors or in a tanning bed Have had blistering sunburns, especially during childhood Have a close family member who has had a melanoma Have atypical moles or large birthmarks  Early detection of melanoma is key since treatment is typically straightforward and cure rates are extremely high if we catch it early.   The first sign of melanoma is often a change in a mole or a new dark spot.  The ABCDE system is a way of remembering the signs of melanoma.  A for asymmetry:  The two halves do not match. B for border:  The edges of the growth are irregular. C for color:  A mixture of colors are present instead of an even brown color. D for diameter:  Melanomas are usually (but not always) greater than 6mm - the size of a pencil eraser. E for evolution:  The spot keeps changing in size, shape, and color.  Please check your skin once per month between visits. You can use a small mirror in front and a large mirror behind you to keep an eye on the back side or your body.   If you see any new or changing lesions before your next follow-up, please call to schedule a visit.  Please continue daily skin protection including broad spectrum sunscreen SPF 30+ to sun-exposed areas, reapplying every 2 hours as needed when you're outdoors.   Staying in the shade or wearing long sleeves, sun  glasses (UVA+UVB protection) and wide brim hats (4-inch brim around the entire circumference of the hat) are also recommended for sun protection.     Due to recent changes in healthcare laws, you may see results of your pathology and/or laboratory studies on MyChart before the doctors have had a chance to review them. We understand that in some cases there may be results that are confusing or concerning to you. Please understand that not all results are received at the same time and often the doctors may need to interpret multiple results in order to provide you with the best plan of care or course of treatment. Therefore, we ask that you please give us 2 business days to thoroughly review all your results before contacting the office for clarification. Should we see a critical lab result, you will be contacted sooner.   If You Need Anything After Your Visit  If you have any questions or concerns for your doctor, please call our main line at [phone] and press option 4 to reach your doctor's medical assistant. If no one answers, please leave a voicemail as directed and we will return your call as soon as possible. Messages left after 4 pm will be answered the following business day.   You may also send us a message via MyChart. We typically respond to MyChart messages within 1-2 business days.  For prescription refills, please ask your pharmacy to contact   our office. Our fax number is [phone].  If you have an urgent issue when the clinic is closed that cannot wait until the next business day, you can page your doctor at the number below.    Please note that while we do our best to be available for urgent issues outside of office hours, we are not available 24/7.   If you have an urgent issue and are unable to reach us, you may choose to seek medical care at your doctor's office, retail clinic, urgent care center, or emergency room.  If you have a medical emergency, please immediately  call 911 or go to the emergency department.  Pager Numbers  - Dr. Kowalski: [phone]  - Dr. Moye: [phone]  - Dr. Stewart: [phone]  In the event of inclement weather, please call our main line at [phone] for an update on the status of any delays or closures.  Dermatology Medication Tips: Please keep the boxes that topical medications come in in order to help keep track of the instructions about where and how to use these. Pharmacies typically print the medication instructions only on the boxes and not directly on the medication tubes.   If your medication is too expensive, please contact our office at [phone] option 4 or send us a message through MyChart.   We are unable to tell what your co-pay for medications will be in advance as this is different depending on your insurance coverage. However, we may be able to find a substitute medication at lower cost or fill out paperwork to get insurance to cover a needed medication.   If a prior authorization is required to get your medication covered by your insurance company, please allow us 1-2 business days to complete this process.  Drug prices often vary depending on where the prescription is filled and some pharmacies may offer cheaper prices.  The website www.goodrx.com contains coupons for medications through different pharmacies. The prices here do not account for what the cost may be with help from insurance (it may be cheaper with your insurance), but the website can give you the price if you did not use any insurance.  - You can print the associated coupon and take it with your prescription to the pharmacy.  - You may also stop by our office during regular business hours and pick up a GoodRx coupon card.  - If you need your prescription sent electronically to a different pharmacy, notify our office through [facility] MyChart or by phone at [phone] option 4.     Si Usted Necesita Algo Despus de Su  Visita  Tambin puede enviarnos un mensaje a travs de MyChart. Por lo general respondemos a los mensajes de MyChart en el transcurso de 1 a 2 das hbiles.  Para renovar recetas, por favor pida a su farmacia que se ponga en contacto con nuestra oficina. Nuestro nmero de fax es el [phone].  Si tiene un asunto urgente cuando la clnica est cerrada y que no puede esperar hasta el siguiente da hbil, puede llamar/localizar a su doctor(a) al nmero que aparece a continuacin.   Por favor, tenga en cuenta que aunque hacemos todo lo posible para estar disponibles para asuntos urgentes fuera del horario de oficina, no estamos disponibles las 24 horas del da, los 7 das de la semana.   Si tiene un problema urgente y no puede comunicarse con nosotros, puede optar por buscar atencin mdica  en el consultorio de su doctor(a), en una clnica privada,   en un centro de atencin urgente o en una sala de emergencias.  Si tiene una emergencia mdica, por favor llame inmediatamente al 911 o vaya a la sala de emergencias.  Nmeros de bper  - Dr. Kowalski: [phone]  - Dra. Moye: [phone]  - Dra. Stewart: [phone]  En caso de inclemencias del tiempo, por favor llame a nuestra lnea principal al [phone] para una actualizacin sobre el estado de cualquier retraso o cierre.  Consejos para la medicacin en dermatologa: Por favor, guarde las cajas en las que vienen los medicamentos de uso tpico para ayudarle a seguir las instrucciones sobre dnde y cmo usarlos. Las farmacias generalmente imprimen las instrucciones del medicamento slo en las cajas y no directamente en los tubos del medicamento.   Si su medicamento es muy caro, por favor, pngase en contacto con nuestra oficina llamando al [phone] y presione la opcin 4 o envenos un mensaje a travs de MyChart.   No podemos decirle cul ser su copago por los medicamentos por adelantado ya que esto es diferente dependiendo de la  cobertura de su seguro. Sin embargo, es posible que podamos encontrar un medicamento sustituto a menor costo o llenar un formulario para que el seguro cubra el medicamento que se considera necesario.   Si se requiere una autorizacin previa para que su compaa de seguros cubra su medicamento, por favor permtanos de 1 a 2 das hbiles para completar este proceso.  Los precios de los medicamentos varan con frecuencia dependiendo del lugar de dnde se surte la receta y alguna farmacias pueden ofrecer precios ms baratos.  El sitio web www.goodrx.com tiene cupones para medicamentos de diferentes farmacias. Los precios aqu no tienen en cuenta lo que podra costar con la ayuda del seguro (puede ser ms barato con su seguro), pero el sitio web puede darle el precio si no utiliz ningn seguro.  - Puede imprimir el cupn correspondiente y llevarlo con su receta a la farmacia.  - Tambin puede pasar por nuestra oficina durante el horario de atencin regular y recoger una tarjeta de cupones de GoodRx.  - Si necesita que su receta se enve electrnicamente a una farmacia diferente, informe a nuestra oficina a travs de MyChart de [facility] o por telfono llamando al [phone] y presione la opcin 4.  

## 2022-01-25 NOTE — Progress Notes (Signed)
   Follow-Up Visit   Subjective  Robyn Montgomery is a 67 y.o. female who presents for the following: Annual Exam (Hx of multiple BCC's. Hx of multiple dysplastic nevi). The patient presents for Total-Body Skin Exam (TBSE) for skin cancer screening and mole check.  The patient has spots, moles and lesions to be evaluated, some may be new or changing and the patient has concerns that these could be cancer.  The following portions of the chart were reviewed this encounter and updated as appropriate:  Tobacco  Allergies  Meds  Problems  Med Hx  Surg Hx  Fam Hx     Review of Systems: No other skin or systemic complaints except as noted in HPI or Assessment and Plan.  Objective  Well appearing patient in no apparent distress; mood and affect are within normal limits.  A full examination was performed including scalp, head, eyes, ears, nose, lips, neck, chest, axillae, abdomen, back, buttocks, bilateral upper extremities, bilateral lower extremities, hands, feet, fingers, toes, fingernails, and toenails. All findings within normal limits unless otherwise noted below.   Assessment & Plan   History of Basal Cell Carcinoma of the Skin - No evidence of recurrence today - Recommend regular full body skin exams - Recommend daily broad spectrum sunscreen SPF 30+ to sun-exposed areas, reapply every 2 hours as needed.  - Call if any new or changing lesions are noted between office visits   History of Dysplastic Nevi - No evidence of recurrence today - Recommend regular full body skin exams - Recommend daily broad spectrum sunscreen SPF 30+ to sun-exposed areas, reapply every 2 hours as needed.  - Call if any new or changing lesions are noted between office visits   Lentigines - Scattered tan macules - Due to sun exposure - Benign-appearing, observe - Recommend daily broad spectrum sunscreen SPF 30+ to sun-exposed areas, reapply every 2 hours as needed. - Call for any changes  Seborrheic  Keratoses - Stuck-on, waxy, tan-brown papules and/or plaques  - Benign-appearing - Discussed benign etiology and prognosis. - Observe - Call for any changes  Melanocytic Nevi - Tan-brown and/or pink-flesh-colored symmetric macules and papules - Benign appearing on exam today - Observation - Call clinic for new or changing moles - Recommend daily use of broad spectrum spf 30+ sunscreen to sun-exposed areas.   Hemangiomas - Red papules - Discussed benign nature - Observe - Call for any changes  Actinic Damage - Chronic condition, secondary to cumulative UV/sun exposure - diffuse scaly erythematous macules with underlying dyspigmentation - Recommend daily broad spectrum sunscreen SPF 30+ to sun-exposed areas, reapply every 2 hours as needed.  - Staying in the shade or wearing long sleeves, sun glasses (UVA+UVB protection) and wide brim hats (4-inch brim around the entire circumference of the hat) are also recommended for sun protection.  - Call for new or changing lesions.  Skin cancer screening performed today.   Return in about 6 months (around 07/26/2022) for TBSE, HxBCCs, DN.  I, Emelia Salisbury, CMA, am acting as scribe for Sarina Ser, MD. Documentation: I have reviewed the above documentation for accuracy and completeness, and I agree with the above.  Sarina Ser, MD

## 2022-02-03 ENCOUNTER — Encounter: Payer: Self-pay | Admitting: Dermatology

## 2022-07-28 ENCOUNTER — Ambulatory Visit: Payer: PPO | Admitting: Dermatology

## 2022-08-16 ENCOUNTER — Encounter: Payer: PPO | Admitting: Family

## 2022-08-16 ENCOUNTER — Ambulatory Visit: Payer: PPO | Admitting: Dermatology

## 2022-08-17 ENCOUNTER — Encounter: Payer: Self-pay | Admitting: Dermatology

## 2022-08-17 ENCOUNTER — Ambulatory Visit: Payer: PPO | Admitting: Dermatology

## 2022-08-17 VITALS — BP 150/93 | HR 79 | Resp 20 | Ht 64.0 in | Wt 150.0 lb

## 2022-08-17 DIAGNOSIS — D1801 Hemangioma of skin and subcutaneous tissue: Secondary | ICD-10-CM

## 2022-08-17 DIAGNOSIS — Z85828 Personal history of other malignant neoplasm of skin: Secondary | ICD-10-CM | POA: Diagnosis not present

## 2022-08-17 DIAGNOSIS — L814 Other melanin hyperpigmentation: Secondary | ICD-10-CM | POA: Diagnosis not present

## 2022-08-17 DIAGNOSIS — L821 Other seborrheic keratosis: Secondary | ICD-10-CM | POA: Diagnosis not present

## 2022-08-17 DIAGNOSIS — L820 Inflamed seborrheic keratosis: Secondary | ICD-10-CM

## 2022-08-17 DIAGNOSIS — Z86018 Personal history of other benign neoplasm: Secondary | ICD-10-CM

## 2022-08-17 DIAGNOSIS — C9591 Leukemia, unspecified, in remission: Secondary | ICD-10-CM

## 2022-08-17 DIAGNOSIS — L578 Other skin changes due to chronic exposure to nonionizing radiation: Secondary | ICD-10-CM | POA: Diagnosis not present

## 2022-08-17 DIAGNOSIS — Z1283 Encounter for screening for malignant neoplasm of skin: Secondary | ICD-10-CM

## 2022-08-17 DIAGNOSIS — D229 Melanocytic nevi, unspecified: Secondary | ICD-10-CM

## 2022-08-17 NOTE — Progress Notes (Signed)
Follow-Up Visit   Subjective  Robyn Montgomery is a 67 y.o. female who presents for the following: TBSE  Patient present today for follow up visit for TBSE. Patient was last evaluated on 01/25/2022. Patient reports NO medication changes. Patient reports throughout her lifetime she has had minimal sun exposure. Currently, patient reports she does apply sun screen and/or wears protective coverings if she has excessive sun exposure. Patient reports no concerns for spots today.  The following portions of the chart were reviewed this encounter and updated as appropriate: medications, allergies, medical history  Review of Systems:  No other skin or systemic complaints except as noted in HPI or Assessment and Plan.  Objective  Well appearing patient in no apparent distress; mood and affect are within normal limits.  A full examination was performed including scalp, head, eyes, ears, nose, lips, neck, chest, axillae, abdomen, back, buttocks, bilateral upper extremities, bilateral lower extremities, hands, feet, fingers, toes, fingernails, and toenails. All findings within normal limits unless otherwise noted below.   Relevant exam findings are noted in the Assessment and Plan. Right Axilla Stuck-on, waxy, tan-brown papules and plaques -- Discussed benign etiology and prognosis.    Assessment & Plan   LENTIGINES, SEBORRHEIC KERATOSES, HEMANGIOMAS - Benign normal skin lesions - Benign-appearing - Call for any changes  MELANOCYTIC NEVI - Tan-brown and/or pink-flesh-colored symmetric macules and papules - Benign appearing on exam today - Observation - Call clinic for new or changing moles - Recommend daily use of broad spectrum spf 30+ sunscreen to sun-exposed areas.   ACTINIC DAMAGE - Chronic condition, secondary to cumulative UV/sun exposure - diffuse scaly erythematous macules with underlying dyspigmentation - Recommend daily broad spectrum sunscreen SPF 30+ to sun-exposed areas, reapply  every 2 hours as needed.  - Staying in the shade or wearing long sleeves, sun glasses (UVA+UVB protection) and wide brim hats (4-inch brim around the entire circumference of the hat) are also recommended for sun protection.  - Call for new or changing lesions.  SKIN CANCER SCREENING PERFORMED TODAY  0.3 cm flesh colored papule - Scar likely from chemo port Left Chest 0.8 cm Firm flesh colored papule  Lukemia in Remission -Patient also has hx of Lukemia (Bone Marrow Transplant) is now in Remission  Patient has previous hx of: 12/13/2019 Dysplastic nevus  12/13/2019 Basal cell carcinoma  08/14/2019 Dysplastic nevi  06/28/2019 Basal cell carcinoma  06/28/2019 Basal cell carcinoma  12/25/2018 Basal cell carcinoma  06/19/2018 Basal cell carcinoma  06/13/2017 Basal cell carcinoma  12/13/2016 Basal cell carcinoma  06/23/2016 Dysplastic nevus  06/23/2016 Basal cell carcinoma  05/26/2015 Basal cell carcinoma  10/07/2014 Basal cell carcinoma  10/04/2013 Dysplastic nevus  10/04/2013 Basal cell carcinoma  06/22/2012 Basal cell carcinoma  01/16/2007 Dysplastic nevus  01/20/2006 Basal cell carcinoma   Inflamed seborrheic keratosis Right Axilla  Symptomatic, irritating, patient would like treated.  Benign-appearing.  Call clinic for new or changing lesions.    Destruction of lesion - Right Axilla Complexity: simple   Destruction method: cryotherapy   Informed consent: discussed and consent obtained   Timeout:  patient name, date of birth, surgical site, and procedure verified Lesion destroyed using liquid nitrogen: Yes   Region frozen until ice ball extended beyond lesion: Yes   Cryotherapy cycles:  1 Outcome: patient tolerated procedure well with no complications   Post-procedure details: wound care instructions given       Return in about 6 months (around 02/17/2023) for TBSE.  Documentation: I have reviewed the above  documentation for accuracy and  completeness, and I agree with the above.  Stasia Cavalier, am acting as scribe for Armida Sans, MD.  Armida Sans, MD Documentation: I have reviewed the above documentation for accuracy and completeness, and I agree with the above.  Armida Sans, MD

## 2022-08-17 NOTE — Patient Instructions (Signed)

## 2022-08-18 ENCOUNTER — Other Ambulatory Visit (HOSPITAL_COMMUNITY)
Admission: RE | Admit: 2022-08-18 | Discharge: 2022-08-18 | Disposition: A | Discharge status: Home / Self Care | Payer: PPO | Source: Ambulatory Visit | Attending: Family | Admitting: Family

## 2022-08-18 ENCOUNTER — Ambulatory Visit (INDEPENDENT_AMBULATORY_CARE_PROVIDER_SITE_OTHER): Payer: PPO | Admitting: Family

## 2022-08-18 ENCOUNTER — Encounter: Payer: Self-pay | Admitting: Family

## 2022-08-18 VITALS — BP 118/70 | Temp 91.0°F | Ht 64.0 in | Wt 145.8 lb

## 2022-08-18 DIAGNOSIS — I700 Atherosclerosis of aorta: Secondary | ICD-10-CM

## 2022-08-18 DIAGNOSIS — Z1151 Encounter for screening for human papillomavirus (HPV): Secondary | ICD-10-CM | POA: Insufficient documentation

## 2022-08-18 DIAGNOSIS — Z01419 Encounter for gynecological examination (general) (routine) without abnormal findings: Secondary | ICD-10-CM | POA: Insufficient documentation

## 2022-08-18 DIAGNOSIS — Z124 Encounter for screening for malignant neoplasm of cervix: Secondary | ICD-10-CM

## 2022-08-18 DIAGNOSIS — Z23 Encounter for immunization: Secondary | ICD-10-CM

## 2022-08-18 DIAGNOSIS — Z9484 Stem cells transplant status: Secondary | ICD-10-CM | POA: Diagnosis not present

## 2022-08-18 DIAGNOSIS — Z1231 Encounter for screening mammogram for malignant neoplasm of breast: Secondary | ICD-10-CM | POA: Diagnosis not present

## 2022-08-18 DIAGNOSIS — Z0000 Encounter for general adult medical examination without abnormal findings: Secondary | ICD-10-CM | POA: Diagnosis not present

## 2022-08-18 DIAGNOSIS — M810 Age-related osteoporosis without current pathological fracture: Secondary | ICD-10-CM

## 2022-08-18 LAB — LIPID PANEL
Cholesterol: 177 mg/dL (ref 0–200)
HDL: 59.8 mg/dL (ref >39.00)
LDL Cholesterol: 85 mg/dL (ref 0–99)
NonHDL: 117.31
Total CHOL/HDL Ratio: 3
Triglycerides: 160 mg/dL — ABNORMAL HIGH (ref 0.0–149.0)
VLDL: 32 mg/dL (ref 0.0–40.0)

## 2022-08-18 LAB — TSH: TSH: 2.08 u[IU]/mL (ref 0.35–5.50)

## 2022-08-18 LAB — CBC WITH DIFFERENTIAL/PLATELET
Basophils Absolute: 0 10*3/uL (ref 0.0–0.1)
Basophils Relative: 0.5 % (ref 0.0–3.0)
Eosinophils Absolute: 0.1 10*3/uL (ref 0.0–0.7)
Eosinophils Relative: 2.6 % (ref 0.0–5.0)
HCT: 41.2 % (ref 36.0–46.0)
Hemoglobin: 13.5 g/dL (ref 12.0–15.0)
Lymphocytes Relative: 29.1 % (ref 12.0–46.0)
Lymphs Abs: 1.2 10*3/uL (ref 0.7–4.0)
MCHC: 32.7 g/dL (ref 30.0–36.0)
MCV: 96.1 fl (ref 78.0–100.0)
Monocytes Absolute: 0.4 10*3/uL (ref 0.1–1.0)
Monocytes Relative: 9.2 % (ref 3.0–12.0)
Neutro Abs: 2.4 10*3/uL (ref 1.4–7.7)
Neutrophils Relative %: 58.6 % (ref 43.0–77.0)
Platelets: 266 10*3/uL (ref 150.0–400.0)
RBC: 4.29 Mil/uL (ref 3.87–5.11)
RDW: 14.1 % (ref 11.5–15.5)
WBC: 4.1 10*3/uL (ref 4.0–10.5)

## 2022-08-18 LAB — COMPREHENSIVE METABOLIC PANEL
ALT: 13 U/L (ref 0–35)
AST: 17 U/L (ref 0–37)
Albumin: 4.8 g/dL (ref 3.5–5.2)
Alkaline Phosphatase: 85 U/L (ref 39–117)
BUN: 11 mg/dL (ref 6–23)
CO2: 28 mEq/L (ref 19–32)
Calcium: 10.7 mg/dL — ABNORMAL HIGH (ref 8.4–10.5)
Chloride: 103 mEq/L (ref 96–112)
Creatinine, Ser: 0.95 mg/dL (ref 0.40–1.20)
GFR: 61.22 mL/min (ref >60.00)
Glucose, Bld: 99 mg/dL (ref 70–99)
Potassium: 5.2 mEq/L — ABNORMAL HIGH (ref 3.5–5.1)
Sodium: 141 mEq/L (ref 135–145)
Total Bilirubin: 0.6 mg/dL (ref 0.2–1.2)
Total Protein: 7.2 g/dL (ref 6.0–8.3)

## 2022-08-18 LAB — VITAMIN D 25 HYDROXY (VIT D DEFICIENCY, FRACTURES): VITD: 33.28 ng/mL (ref 30.00–100.00)

## 2022-08-18 LAB — HEMOGLOBIN A1C: Hgb A1c MFr Bld: 6 % (ref 4.6–6.5)

## 2022-08-18 MED ORDER — ROSUVASTATIN CALCIUM 5 MG PO TABS: 5.0000 mg | ORAL_TABLET | Freq: Every day | ORAL | 3 refills | Status: DC

## 2022-08-18 NOTE — Assessment & Plan Note (Signed)
Clinical breast exam performed today.  Patient will schedule mammogram.  Colonoscopy is up-to-date for Dr Norma Fredrickson note 04/24/2019.  We discussed history of stem cell transplant and jointly agreed on shared decision making to vaccinate with  PCV20 today.  Congratulated patient on diligence to exercise.

## 2022-08-18 NOTE — Progress Notes (Signed)
Assessment & Plan:  Routine general medical examination at a health care facility Assessment & Plan: Clinical breast exam performed today.  Patient will schedule mammogram.  Colonoscopy is up-to-date for Dr Norma Fredrickson note 04/24/2019.  We discussed history of stem cell transplant and jointly agreed on shared decision making to vaccinate with  PCV20 today.  Congratulated patient on diligence to exercise.  Orders: -     VITAMIN D 25 Hydroxy (Vit-D Deficiency, Fractures) -     CBC with Differential/Platelet -     Comprehensive metabolic panel -     Hemoglobin A1c -     Lipid panel -     TSH -     Cytology - PAP -     Rosuvastatin Calcium; Take 1 tablet (5 mg total) by mouth daily.  Dispense: 90 tablet; Refill: 3 -     3D Screening Mammogram, Left and Right; Future  Atherosclerosis of aorta (HCC) -     Comprehensive metabolic panel -     Hemoglobin A1c -     Lipid panel -     Rosuvastatin Calcium; Take 1 tablet (5 mg total) by mouth daily.  Dispense: 90 tablet; Refill: 3  Osteoporosis, unspecified osteoporosis type, unspecified pathological fracture presence -     VITAMIN D 25 Hydroxy (Vit-D Deficiency, Fractures)  History of stem cell transplant (HCC) -     CBC with Differential/Platelet  Immunization due  Encounter for screening mammogram for malignant neoplasm of breast -     3D Screening Mammogram, Left and Right; Future  Screening for cervical cancer -     Cytology - PAP  Need for pneumococcal 20-valent conjugate vaccination -     Pneumococcal conjugate vaccine 20-valent     Return precautions given.   Risks, benefits, and alternatives of the medications and treatment plan prescribed today were discussed, and patient expressed understanding.   Education regarding symptom management and diagnosis given to patient on AVS either electronically or printed.  No follow-ups on file.  Rennie Plowman, FNP  Subjective:    Patient ID: Robyn Montgomery, female    DOB:  [birth year], 67 y.o.   MRN: [id number]  CC: Robyn Montgomery is a 67 y.o. female who presents today for physical exam.    HPI: Feels well today.  No new complaints.  Patient has a history of leukemia, status post stem cell transplant 6 years ago   Colorectal Cancer Screening: UTD, to be repeat 10 years, 11/15/2013 Breast Cancer Screening: Mammogram UTD Cervical Cancer Screening: Pap smear 08/11/2021 negative malignancy, HPV.  Atrophy present Bone Health screening/DEXA for 65+:UTD  Lung Cancer Screening: Doesn't have 20 year pack year history and age > 23 years yo 45 years         Tetanus - UTD        Pneumococcal -completed PCV 13, PCV 23;  Exercise: Gets regular exercise, walking , pilates.   Alcohol use:  rare Smoking/tobacco use: Nonsmoker.    Health Maintenance  Topic Date Due  . COVID-19 Vaccine (6 - 2023-24 season) 12/06/2021  . Pap Smear  08/12/2022  . Flu Shot  04/11/2023*  . Mammogram  09/08/2022  . Medicare Annual Wellness Visit  11/03/2022  . Colon Cancer Screening  11/16/2023  . DTaP/Tdap/Td vaccine (5 - Tdap) 03/01/2028  . Pneumonia Vaccine  Completed  . DEXA scan (bone density measurement)  Completed  . Hepatitis C Screening  Completed  . Zoster (Shingles) Vaccine  Completed  . HPV Vaccine  Aged  Out  *Topic was postponed. The date shown is not the original due date.    ALLERGIES: Dapsone, Rasburicase, Sulfasalazine, Penicillins, Sulfa antibiotics, Vancomycin, Cefepime, Morphine, Tape, and Tapentadol  Current Outpatient Medications on File Prior to Visit  Medication Sig Dispense Refill  . ascorbic acid (VITAMIN C) 500 MG tablet Take by mouth.    . Calcium Carb-Cholecalciferol 600-800 MG-UNIT TABS Take by mouth.     No current facility-administered medications on file prior to visit.    Review of Systems  Constitutional:  Negative for chills, fever and unexpected weight change.  HENT:  Negative for congestion.   Respiratory:  Negative for cough.    Cardiovascular:  Negative for chest pain, palpitations and leg swelling.  Gastrointestinal:  Negative for nausea and vomiting.  Musculoskeletal:  Negative for arthralgias and myalgias.  Skin:  Negative for rash.  Neurological:  Negative for headaches.  Hematological:  Negative for adenopathy.  Psychiatric/Behavioral:  Negative for confusion.       Objective:    BP 118/70   Temp (!) 91 F (32.8 C) (Oral)   Ht 5\' 4"  (1.626 m)   Wt 145 lb 12.8 oz (66.1 kg)   SpO2 97%   BMI 25.03 kg/m   BP Readings from Last 3 Encounters:  08/18/22 118/70  08/17/22 (!) 150/93  01/25/22 131/78   Wt Readings from Last 3 Encounters:  08/18/22 145 lb 12.8 oz (66.1 kg)  08/17/22 150 lb (68 kg)  11/02/21 147 lb (66.7 kg)    Physical Exam Vitals reviewed.  Constitutional:      Appearance: Normal appearance. She is well-developed.  Eyes:     Conjunctiva/sclera: Conjunctivae normal.  Neck:     Thyroid: No thyroid mass or thyromegaly.  Cardiovascular:     Rate and Rhythm: Normal rate and regular rhythm.     Pulses: Normal pulses.     Heart sounds: Normal heart sounds.  Pulmonary:     Effort: Pulmonary effort is normal.     Breath sounds: Normal breath sounds. No wheezing, rhonchi or rales.  Chest:  Breasts:    Breasts are symmetrical.     Right: No inverted nipple, mass, nipple discharge, skin change or tenderness.     Left: No inverted nipple, mass, nipple discharge, skin change or tenderness.  Abdominal:     General: Bowel sounds are normal. There is no distension.     Palpations: Abdomen is soft. Abdomen is not rigid. There is no fluid wave or mass.     Tenderness: There is no abdominal tenderness. There is no guarding or rebound.  Genitourinary:    Cervix: No cervical motion tenderness, discharge or friability.     Uterus: Not enlarged, not fixed and not tender.      Adnexa:        Right: No mass, tenderness or fullness.         Left: No mass, tenderness or fullness.        Comments: Pap performed. No CMT. Unable to appreciated ovaries. Lymphadenopathy:     Head:     Right side of head: No submental, submandibular, tonsillar, preauricular, posterior auricular or occipital adenopathy.     Left side of head: No submental, submandibular, tonsillar, preauricular, posterior auricular or occipital adenopathy.     Cervical:     Right cervical: No superficial, deep or posterior cervical adenopathy.    Left cervical: No superficial, deep or posterior cervical adenopathy.     Upper Body:     Right upper  body: No pectoral adenopathy.     Left upper body: No pectoral adenopathy.  Skin:    General: Skin is warm and dry.  Neurological:     Mental Status: She is alert.  Psychiatric:        Speech: Speech normal.        Behavior: Behavior normal.        Thought Content: Thought content normal.

## 2022-08-24 ENCOUNTER — Other Ambulatory Visit: Payer: Self-pay | Admitting: Family

## 2022-08-24 DIAGNOSIS — R899 Unspecified abnormal finding in specimens from other organs, systems and tissues: Secondary | ICD-10-CM

## 2022-08-26 ENCOUNTER — Encounter: Payer: Self-pay | Admitting: Dermatology

## 2022-08-29 ENCOUNTER — Encounter: Payer: Self-pay | Admitting: Family

## 2022-09-15 ENCOUNTER — Ambulatory Visit
Admission: RE | Admit: 2022-09-15 | Discharge: 2022-09-15 | Disposition: A | Discharge status: Home / Self Care | Payer: PPO | Source: Ambulatory Visit | Attending: Family | Admitting: Family

## 2022-09-15 DIAGNOSIS — Z0000 Encounter for general adult medical examination without abnormal findings: Secondary | ICD-10-CM | POA: Insufficient documentation

## 2022-09-15 DIAGNOSIS — Z1231 Encounter for screening mammogram for malignant neoplasm of breast: Secondary | ICD-10-CM | POA: Diagnosis not present

## 2022-09-16 ENCOUNTER — Other Ambulatory Visit: Payer: Self-pay | Admitting: Family

## 2022-09-16 DIAGNOSIS — R928 Other abnormal and inconclusive findings on diagnostic imaging of breast: Secondary | ICD-10-CM

## 2022-09-21 ENCOUNTER — Ambulatory Visit
Admission: RE | Admit: 2022-09-21 | Discharge: 2022-09-21 | Disposition: A | Discharge status: Home / Self Care | Payer: PPO | Source: Ambulatory Visit | Attending: Family

## 2022-09-21 ENCOUNTER — Other Ambulatory Visit (INDEPENDENT_AMBULATORY_CARE_PROVIDER_SITE_OTHER): Payer: PPO

## 2022-09-21 ENCOUNTER — Ambulatory Visit
Admission: RE | Admit: 2022-09-21 | Discharge: 2022-09-21 | Disposition: A | Discharge status: Home / Self Care | Payer: PPO | Source: Ambulatory Visit | Attending: Family | Admitting: Family

## 2022-09-21 DIAGNOSIS — R899 Unspecified abnormal finding in specimens from other organs, systems and tissues: Secondary | ICD-10-CM

## 2022-09-21 DIAGNOSIS — R928 Other abnormal and inconclusive findings on diagnostic imaging of breast: Secondary | ICD-10-CM

## 2022-09-21 DIAGNOSIS — R92321 Mammographic fibroglandular density, right breast: Secondary | ICD-10-CM | POA: Diagnosis not present

## 2022-09-21 LAB — COMPREHENSIVE METABOLIC PANEL
ALT: 15 U/L (ref 0–35)
AST: 18 U/L (ref 0–37)
Albumin: 4.5 g/dL (ref 3.5–5.2)
Alkaline Phosphatase: 80 U/L (ref 39–117)
BUN: 12 mg/dL (ref 6–23)
CO2: 30 meq/L (ref 19–32)
Calcium: 10.3 mg/dL (ref 8.4–10.5)
Chloride: 105 meq/L (ref 96–112)
Creatinine, Ser: 0.89 mg/dL (ref 0.40–1.20)
GFR: 66.16 mL/min (ref >60.00)
Glucose, Bld: 95 mg/dL (ref 70–99)
Potassium: 4.6 meq/L (ref 3.5–5.1)
Sodium: 143 meq/L (ref 135–145)
Total Bilirubin: 0.6 mg/dL (ref 0.2–1.2)
Total Protein: 7.6 g/dL (ref 6.0–8.3)

## 2022-09-23 LAB — PTH, INTACT (ICMA) AND IONIZED CALCIUM
Calcium, Ion: 5.2 mg/dL (ref 4.7–5.5)
Calcium: 10.3 mg/dL (ref 8.6–10.4)
PTH: 39 pg/mL (ref 16–77)

## 2022-10-11 ENCOUNTER — Encounter: Payer: Self-pay | Admitting: Family

## 2022-10-12 ENCOUNTER — Other Ambulatory Visit: Payer: Self-pay

## 2022-10-12 DIAGNOSIS — I700 Atherosclerosis of aorta: Secondary | ICD-10-CM

## 2022-10-12 DIAGNOSIS — Z0000 Encounter for general adult medical examination without abnormal findings: Secondary | ICD-10-CM

## 2022-10-12 MED ORDER — ROSUVASTATIN CALCIUM 5 MG PO TABS: 5.0000 mg | ORAL_TABLET | Freq: Every day | ORAL | 3 refills | Status: DC

## 2022-11-12 DIAGNOSIS — I429 Cardiomyopathy, unspecified: Principal | ICD-10-CM

## 2022-11-12 DIAGNOSIS — I427 Cardiomyopathy due to drug and external agent: Principal | ICD-10-CM

## 2022-12-15 ENCOUNTER — Telehealth: Payer: Self-pay | Admitting: Family

## 2022-12-15 NOTE — Telephone Encounter (Signed)
Copied from CRM [id number]. Topic: Medicare AWV >> Dec 15, 2022 10:28 AM Payton Doughty wrote: Reason for CRM: Called LVM 12/15/2022 to schedule Annual Wellness Visit  Robyn Montgomery; Care Guide Ambulatory Clinical Support [place] [facility] Direct Dial: [phone]

## 2023-01-26 ENCOUNTER — Telehealth: Payer: Self-pay | Admitting: Family

## 2023-01-26 NOTE — Telephone Encounter (Signed)
 Copied from CRM [id number]. Topic: Medicare AWV >> Jan 26, 2023  9:53 AM Juliana Ocean wrote: Reason for CRM: Called LVM 01/26/2023 to schedule AWV. Please schedule office or virtual visits.  Rosalee Collins; Care Guide Ambulatory Clinical Support [place] [facility] Direct Dial: [phone]

## 2023-02-24 ENCOUNTER — Ambulatory Visit: Payer: PPO | Admitting: Dermatology

## 2023-03-01 ENCOUNTER — Telehealth: Payer: Self-pay | Admitting: Family

## 2023-03-01 NOTE — Telephone Encounter (Signed)
 Copied from CRM [id number]. Topic: Medicare AWV >> Mar 01, 2023 11:16 AM Payton Doughty wrote: Reason for CRM: Called LVM 03/01/2023 to schedule AWV. Please schedule office or virtual visits.  Verlee Rossetti; Care Guide Ambulatory Clinical Support [place] [facility] Direct Dial: [phone]

## 2023-05-31 ENCOUNTER — Ambulatory Visit: Admit: 2023-05-31 | Discharge: 2023-05-31 | Payer: Medicare (Managed Care)

## 2023-05-31 ENCOUNTER — Ambulatory Visit
Admit: 2023-05-31 | Discharge: 2023-05-31 | Payer: Medicare (Managed Care) | Attending: Cardiovascular Disease | Primary: Cardiovascular Disease

## 2023-05-31 ENCOUNTER — Inpatient Hospital Stay: Admit: 2023-05-31 | Discharge: 2023-05-31 | Payer: Medicare (Managed Care)

## 2023-05-31 DIAGNOSIS — I5022 Chronic systolic (congestive) heart failure: Principal | ICD-10-CM

## 2023-05-31 DIAGNOSIS — I427 Cardiomyopathy due to drug and external agent: Principal | ICD-10-CM

## 2023-05-31 DIAGNOSIS — I110 Hypertensive heart disease with heart failure: Secondary | ICD-10-CM | POA: Diagnosis not present

## 2023-05-31 DIAGNOSIS — Z9221 Personal history of antineoplastic chemotherapy: Secondary | ICD-10-CM | POA: Diagnosis not present

## 2023-05-31 DIAGNOSIS — Z86718 Personal history of other venous thrombosis and embolism: Secondary | ICD-10-CM | POA: Diagnosis not present

## 2023-05-31 DIAGNOSIS — Z9481 Bone marrow transplant status: Secondary | ICD-10-CM | POA: Diagnosis not present

## 2023-05-31 DIAGNOSIS — C9201 Acute myeloblastic leukemia, in remission: Secondary | ICD-10-CM | POA: Diagnosis not present

## 2023-05-31 DIAGNOSIS — I447 Left bundle-branch block, unspecified: Secondary | ICD-10-CM | POA: Diagnosis not present

## 2023-05-31 DIAGNOSIS — Z880 Allergy status to penicillin: Secondary | ICD-10-CM | POA: Diagnosis not present

## 2023-05-31 DIAGNOSIS — Z885 Allergy status to narcotic agent status: Secondary | ICD-10-CM | POA: Diagnosis not present

## 2023-05-31 DIAGNOSIS — C9200 Acute myeloblastic leukemia, not having achieved remission: Secondary | ICD-10-CM | POA: Diagnosis not present

## 2023-05-31 DIAGNOSIS — I10 Essential (primary) hypertension: Secondary | ICD-10-CM | POA: Diagnosis not present

## 2023-05-31 DIAGNOSIS — Z882 Allergy status to sulfonamides status: Secondary | ICD-10-CM | POA: Diagnosis not present

## 2023-05-31 DIAGNOSIS — Z9484 Stem cells transplant status: Secondary | ICD-10-CM | POA: Diagnosis not present

## 2023-05-31 DIAGNOSIS — E870 Hyperosmolality and hypernatremia: Secondary | ICD-10-CM | POA: Diagnosis not present

## 2023-05-31 DIAGNOSIS — Z79899 Other long term (current) drug therapy: Secondary | ICD-10-CM | POA: Diagnosis not present

## 2023-05-31 LAB — COMPREHENSIVE METABOLIC PANEL
ALBUMIN: 4.2 g/dL (ref 3.4–5.0)
ALKALINE PHOSPHATASE: 91 U/L (ref 46–116)
ALT (SGPT): 15 U/L (ref 10–49)
ANION GAP: 12 mmol/L (ref 5–14)
AST (SGOT): 18 U/L (ref <=34)
BILIRUBIN TOTAL: 0.4 mg/dL (ref 0.3–1.2)
BLOOD UREA NITROGEN: 10 mg/dL (ref 9–23)
BUN / CREAT RATIO: 12
CALCIUM: 10.2 mg/dL (ref 8.7–10.4)
CHLORIDE: 106 mmol/L (ref 98–107)
CO2: 27.3 mmol/L (ref 20.0–31.0)
CREATININE: 0.83 mg/dL (ref 0.55–1.02)
EGFR CKD-EPI (2021) FEMALE: 76 mL/min/1.73m2 (ref >=60)
GLUCOSE RANDOM: 103 mg/dL (ref 70–179)
POTASSIUM: 4.5 mmol/L (ref 3.4–4.8)
PROTEIN TOTAL: 7.3 g/dL (ref 5.7–8.2)
SODIUM: 145 mmol/L (ref 135–145)

## 2023-05-31 LAB — PRO-BNP: PRO-BNP: 576 pg/mL — ABNORMAL HIGH (ref <=300.0)

## 2023-05-31 MED ORDER — METOPROLOL SUCCINATE ER 25 MG TABLET,EXTENDED RELEASE 24 HR
ORAL_TABLET | Freq: Every day | ORAL | 3 refills | 90.00000 days | Status: CP
Start: 2023-05-31 — End: 2024-07-04
  Filled 2023-06-01: qty 90, 90d supply, fill #0

## 2023-05-31 NOTE — Unmapped (Signed)
 Nurse Coordinator Pre-call, Instructed to hold Phosphodiesterase inhibitors within 48 hours prior to study. Pt informed CTA may take up to 2+ hours.  Educated about Nitroglycerine and Metoprolol/Ivabradine administration purpose/side effects.  Oral hydration with water encouraged. No caffeinated beverages 12 hours prior to CTA.  Pt confirms understanding.

## 2023-05-31 NOTE — Unmapped (Signed)
 Thank you for choosing [facility] for your healthcare needs.    We are committed to delivering exceptional care and continuously improving our services. After your visit, you may receive a brief survey by text or email regarding your experience.    We invite you to share your thoughts about the care you received today and you???d like to recognize a team member who made your visit special, your feedback is truly appreciated.    Provider Name: Burr Cary, MD   Care Partner Name: Benigno Brakeman, CMA    When the patient survey arrives, we hope you???ll take a few moments to complete it. Your insights help us  grow and continue to maintain a five star experience for every patient.

## 2023-05-31 NOTE — Unmapped (Signed)
 Cardio-oncology Clinic Return Patient/Consult Note    Referring Provider: Barbee Lew  [facility]   Primary Provider: Marline Simons, NP   [address] [facility]  [place] [address]       Reason for Visit:  Christine [name] is a 68 y.o. female who returns for ongoing evaluation and management of cardiomyopathy diagnosed in the setting of treatment for AML.    Assessment & Plan:  1. Cardiomyopathy  The etiology of her cardiomyopathy is not completely certain. Her EF dropped to 35% in 2017, possibly related to daunorubicin exposure then normalized by 2019. Today's echo again shows a low EF. The timing of this recrudescence seems unusual for anthracycline induced cardiotoxicity, though if there is no other obvious etiology we may end up ascribing to her chemotherapy exposure. Though she is entirely asymptomatic with excellent functional status (NYHA Class 1 and euvolemic), I ordered a coronary CTA to exclude ischemia.     She took metoprolol previously and I reordered it today. She will see Lucillie Saba in 2-4 weeks to consider initiation of Entresto and/or metoprolol uptitration. I will see her in 3 months with an echo  --metoprolol succinate 25 mg daily  --echo in 3 months  --coronary CTA  --labs today    2. Blood pressure  Her blood pressure was marginally elevated at a clinic visit in 2019 but has been normal since then.    3. Left bundle branch block  Transient during chemotherapy    4. Acute myeloid leukemia  S/p SCT  Now 7 years post SCT. Ongoing surveillance per [facility] team.    RTC with Lucillie Saba in 2-4 weeks, and with me in 3 months      History of Present Illness:  Day Christine [name] returns for ongoing evaluation and management of cardiomyopathy in the setting of treatment for AML. Christine [name] has no history of heart disease of any sort. She always has been very physically active. Prior to diagnosis of AML she regularly walked 3 miles without any chest pain or shortness of breath. She underwent stem cell transplant on February 24, 2016. During treatment for AML she experienced a drop in her ejection fraction that subsequently nearly normalized.     Interval history  Today in clinic, [name] reports feeling well and has no complaints. She goes to the [facility] 6 times weekly where she does a mixture of cardiovascular, flexibility, and weight training. She walks roughly 3 miles most days with her husband. In so doing she denies any chest pain or dyspnea.  She also denies orthopnea, paroxysmal nocturnal dyspnea, or LE edema. No palpitations, chest pain, dizziness, pre-syncope or syncope.        Cardiovascular History & Procedures:  Cath / PCI:  None  CV Surgery:  None  EP Procedures and Devices:  None    Non-Invasive Evaluation(s):  Echo:  08/28/15  Normal left ventricular systolic function, ejection fraction > 55%  Dilated left atrium - mild  Normal right ventricular systolic function  No significant valvular abnormalities  Degenerative mitral valve disease  Mitral regurgitation - mild    11/04/15  Left ventricular hypertrophy - mild  EF estimated 45-50%, with septal dyskinesis likely due to new LBBB, this is a new finding compared to echo from 08/2015  Diastolic dysfunction - grade II (elevated filling pressures)  Degenerative mitral valve disease  Dilated left atrium - mild  Aortic sclerosis  Normal right ventricular systolic function  12/03/15  Limited study to evaluate ventricular function  Dilated left ventricle - mild  Moderately decreased left ventricular systolic function, ejection fraction 35%  Diastolic dysfunction - grade I (normal filling pressures)  Mitral regurgitation - mild  Normal right ventricular systolic function  Pericardial effusion - small    10/26/16  Limited study to evaluate ventricular function  Borderline left ventricular systolic function  Normal right ventricular systolic function    11/29/17  Limited study to evaluate ventricular function  Normal left ventricular systolic function, ejection fraction 55 to 60%  Diastolic dysfunction - grade I (normal filling pressures)  Normal right ventricular systolic function     11/28/18    1. Limited study to assess ventricular function.    2. Normal left ventricular size and systolic function, ejection fraction 55-60%.    3. Diastolic dysfunction - grade I (normal filling pressures).    4. Normal right ventricular size and systolic function.    CT/MRI/Nuclear Tests:  None       Past Medical History:   Diagnosis Date    Allogeneic stem cell transplant (RAF-HCC) 02/24/2016    Conditioning MAC FluBu (BMT CTN 1301- control arm) Donor: Sister, 10/10, CMV negative, ABO: O- Recip: CMV negative, ABO: O- Source: MARROW Cell dose: 3.18 x 10^6 CD34/kg GVHD prophylaxis: Tacrolimus, methotrexate    AML (acute myelogenous leukemia)       Cardiomyopathy       most likely from daunorubicin    DVT (deep venous thrombosis)       Hypertension     Osteoporosis     Stopped medications due to interactions with chemo drugs      Hematology/Oncology History Overview Note   Referring/Local Oncologist: none    Diagnosis: de novo AML with mutated FLT3    Genetics:   Karyotype/FISH: 47XX +11     Molecular Genetics: FLT3 ITD present; IDH2: c.419G>A [p.Arg140Gln]     No variants detected in ASXL1, BCOR, CEBPA, DNMT3A, ETV6/TEL, EZH2, FLT3, IDH1, KIT, NPM1, NRAS, RUNX1, SF3B1, SRSF2, STAG2, TET2, TP53, U2AF1, WT1, ZRSR2    Pertinent Phenotypic data: blasts expressed CD4, CD11b, CD11c, CD33, CD34, CD64, CD117, and HLA-DR and lack expression of other informative analyzed markers including B and T-cell antigens.     Disease-specific prognostic estimation: ELN Adverse (per FLT3 allelic ratio high per estimate by molecular pathologist)       Acute myeloid leukemia     08/29/2015 Initial Diagnosis    Acute myeloid leukemia (RAF-HCC)     09/02/2015 -  Chemotherapy    Induction therapy with daunorubicin 60 mg/m2 IV daily x 3, cytarabine 200 mg/m2/d CIV x 7 days, midostaurin 50 mg PO BID days 8-21     09/22/2015 Biopsy    50% cellular marrow with 50% blasts     09/25/2015 -  Chemotherapy    CLAG, cladribine 5 mg/m2/day days 1-5, cytarabine 2 grams/m2 IV days 1-5, GCSF days 0-5     10/23/2015 Remission    CR     11/03/2015 -  Chemotherapy    Cytarabine 1.5 g/m2 IV Q12 hrs days 1, 3, 5     11/04/2015 Adverse Reaction    Cardiomyopathy     11/28/2015 Adverse Reaction    Stenotrophomonas maltophilia CVAD infection     12/19/2015 -  Chemotherapy    Cytarabine 1.5 g/m2 IV Q12 hrs days 1, 3, 5     02/24/2016 Bone Marrow Transplant    CTN 1301 (control arm) conditioning regimen with Busulfan and Fludarabine followed  by matched sibling allo, received bone marrow product with cell dose of 3.18 x 10^6.          Past Surgical History:   Procedure Laterality Date    IR INSERT PORT AGE GREATER THAN 5 YRS  12/19/2015    IR INSERT PORT AGE GREATER THAN 5 YRS 12/19/2015 Clifm Dame, MD IMG VIR MM MMNT    PR LAP,APPENDECTOMY N/A 11/08/2017    Procedure: LAPAROSCOPY SURGICAL APPENDECTOMY;  Surgeon: Gwendolynn Lemons, MD;  Location: MAIN OR [facility];  Service: General Surgery    PR UPPER GI ENDOSCOPY,BIOPSY N/A 05/05/2016    Procedure: UGI ENDOSCOPY; WITH BIOPSY, SINGLE OR MULTIPLE;  Surgeon: Marcello Sero, MD;  Location: GI PROCEDURES [facility];  Service: Gastroenterology    SHOULDER SURGERY         Allergies:  Dapsone, Penicillins, Rasburicase, Sulfasalazine, Vancomycin, Adhesive, Amoxicillin, Cefepime, Morphine, and Tapentadol    Current Medications:  Current Outpatient Medications   Medication Sig Dispense Refill    ascorbic acid , vitamin C , (VITAMIN C ) 500 MG tablet Take by mouth.      calcium  carbonate-vitamin D3 600 mg(1,500mg ) -800 unit Tab Take 1 tablet by mouth two (2) times a day. 60 tablet 5    rosuvastatin (CRESTOR) 5 MG tablet Take 1 tablet (5 mg total) by mouth daily.       No current facility-administered medications for this visit.     Facility-Administered Medications Ordered in Other Visits   Medication Dose Route Frequency Provider Last Rate Last Admin    flu vacc qs2019-20 6mos up(PF) (FLUARIX, FLULAVAL, FLUZONE) 60 mcg (15 mcg x 4)/0.5 mL injection             varicella-zoster gE-AS01B (PF) (SHINGRIX) 50 mcg/0.5 mL injection                Family History:  There is no family history of premature coronary artery disease or sudden cardiac death.     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      Highest level of education: high school     Social Drivers of Health     Financial Resource Strain: Low Risk  (11/02/2021)    Received from [facility], [facility]    Overall Financial Resource Strain (CARDIA)     Difficulty of Paying Living Expenses: Not hard at all   Food Insecurity: No Food Insecurity (11/02/2021)    Received from [facility], [facility]    Hunger Vital Sign     Worried About Running Out of Food in the Last Year: Never true     Ran Out of Food in the Last Year: Never true   Transportation Needs: No  Transportation Needs (11/02/2021)    Received from [facility]    [facility] - Transportation     Lack of Transportation (Medical): No     Lack of Transportation (Non-Medical): No   Physical Activity: Sufficiently Active (11/02/2021)    Received from [facility], [facility]    Exercise Vital Sign     Days of Exercise per Week: 5 days     Minutes of Exercise per Session: 60 min   Stress: No Stress Concern Present (11/02/2021)    Received from [facility], [facility]    [facility] - Occupational Stress Questionnaire     Feeling of Stress : Not at all   Social Connections: Unknown (11/02/2021)    Received from [facility], [facility]    Social Connection and Isolation Panel [NHANES]     Marital Status: Married       Review of Systems:  A full review of 10 systems is unremarkable except as stated in the HPI.     Physical Exam:  VITAL SIGNS:   Vitals:    05/31/23 0918   BP: 122/81   Pulse: 71   SpO2: 98%        Wt Readings from Last 3 Encounters:   05/31/23 68.9 kg (151 lb 12.8 oz)   03/03/21 66.6 kg (146 lb 13.2 oz)   02/17/21 66.7 kg (147 lb)      Today's Body mass index is 26.06 kg/m??.   GENERAL: well-appearing in no acute distress  HEENT: Normocephalic and atraumatic with anicteric sclerae    NECK: Supple, without lymphadenopathy or thyromegaly. JVP flat. There are no carotid bruits  CARDIOVASCULAR: Regular S1S2 without murmur or gallop  RESPIRATORY: Clear to auscultation without wheezes or rales.   ABDOMEN: Soft, non-tender, non-distended with audible bowel sounds. There is no organomegaly or palpable pulsatile mass.   EXTREMITIES:  Lower extremities are warm and without edema. Distal pulses are full and symmetric.   SKIN: No rashes, ecchymosis or petechiae.  NEURO: Alert, pleasant, and appropriate. Non-focal neuro exam    Pertinent Laboratory Studies:   Lab Results   Component Value Date    PRO-BNP 400.0 (H) 06/08/2017    PRO-BNP 1,120.0 (H) 11/04/2015    Creatinine 0.81 (H) 03/03/2021    Creatinine 0.85 (H) 03/11/2020    BUN 10 03/03/2021    BUN 13 03/11/2020    Potassium 4.4 03/03/2021    Potassium 3.9 03/11/2020    Potassium, Bld 3.4 06/08/2017    Magnesium  1.9 03/11/2020    Magnesium  2.1 11/24/2017    AST 22 03/03/2021    AST 18 03/11/2020    ALT 15 03/03/2021    ALT 14 03/11/2020    Total Bilirubin 0.5 03/03/2021    Total Bilirubin 0.4 03/11/2020    INR 1.06 11/24/2017    INR 1.22 04/15/2016    WBC 3.7 03/03/2021    WBC <0.1 (LL) 03/05/2016    HGB 12.4 03/03/2021    Hemoglobin 10.9 (L) 06/08/2017    HCT 37.3 03/03/2021    Platelet 262 03/03/2021       Other pertinent records were reviewed.    Pertinent Test Results from Today:  Echo (prelim, my interpretation): EF roughly 35%, GLS -10%

## 2023-06-02 ENCOUNTER — Ambulatory Visit: Admitting: Dermatology

## 2023-06-02 ENCOUNTER — Encounter: Payer: Self-pay | Admitting: Dermatology

## 2023-06-02 DIAGNOSIS — Z1283 Encounter for screening for malignant neoplasm of skin: Secondary | ICD-10-CM

## 2023-06-02 DIAGNOSIS — D229 Melanocytic nevi, unspecified: Secondary | ICD-10-CM

## 2023-06-02 DIAGNOSIS — Z86018 Personal history of other benign neoplasm: Secondary | ICD-10-CM

## 2023-06-02 DIAGNOSIS — Z7189 Other specified counseling: Secondary | ICD-10-CM

## 2023-06-02 DIAGNOSIS — L570 Actinic keratosis: Secondary | ICD-10-CM

## 2023-06-02 DIAGNOSIS — L578 Other skin changes due to chronic exposure to nonionizing radiation: Secondary | ICD-10-CM

## 2023-06-02 DIAGNOSIS — Z8619 Personal history of other infectious and parasitic diseases: Secondary | ICD-10-CM

## 2023-06-02 DIAGNOSIS — B001 Herpesviral vesicular dermatitis: Secondary | ICD-10-CM | POA: Diagnosis not present

## 2023-06-02 DIAGNOSIS — D1801 Hemangioma of skin and subcutaneous tissue: Secondary | ICD-10-CM

## 2023-06-02 DIAGNOSIS — L821 Other seborrheic keratosis: Secondary | ICD-10-CM

## 2023-06-02 DIAGNOSIS — L820 Inflamed seborrheic keratosis: Secondary | ICD-10-CM | POA: Diagnosis not present

## 2023-06-02 DIAGNOSIS — L814 Other melanin hyperpigmentation: Secondary | ICD-10-CM | POA: Diagnosis not present

## 2023-06-02 DIAGNOSIS — Z85828 Personal history of other malignant neoplasm of skin: Secondary | ICD-10-CM

## 2023-06-02 DIAGNOSIS — W908XXA Exposure to other nonionizing radiation, initial encounter: Secondary | ICD-10-CM

## 2023-06-02 NOTE — Patient Instructions (Signed)

## 2023-06-02 NOTE — Progress Notes (Signed)
 Follow-Up Visit   Subjective  Robyn Montgomery is a 68 y.o. female who presents for the following: Skin Cancer Screening and Full Body Skin Exam  The patient presents for Total-Body Skin Exam (TBSE) for skin cancer screening and mole check. The patient has spots, moles and lesions to be evaluated, some may be new or changing and the patient may have concern these could be cancer.  The following portions of the chart were reviewed this encounter and updated as appropriate: medications, allergies, medical history  Review of Systems:  No other skin or systemic complaints except as noted in HPI or Assessment and Plan.  Objective  Well appearing patient in no apparent distress; mood and affect are within normal limits.  A full examination was performed including scalp, head, eyes, ears, nose, lips, neck, chest, axillae, abdomen, back, buttocks, bilateral upper extremities, bilateral lower extremities, hands, feet, fingers, toes, fingernails, and toenails. All findings within normal limits unless otherwise noted below.   Relevant physical exam findings are noted in the Assessment and Plan. R forehead x 1 Erythematous thin papules/macules with gritty scale.  R cheek x 1, R shoulder x 1, L inframammary x 1, L knee x 1 (4) Erythematous stuck-on, waxy papule or plaque  Assessment & Plan   SKIN CANCER SCREENING PERFORMED TODAY.  ACTINIC DAMAGE - Chronic condition, secondary to cumulative UV/sun exposure - diffuse scaly erythematous macules with underlying dyspigmentation - Recommend daily broad spectrum sunscreen SPF 30+ to sun-exposed areas, reapply every 2 hours as needed.  - Staying in the shade or wearing long sleeves, sun glasses (UVA+UVB protection) and wide brim hats (4-inch brim around the entire circumference of the hat) are also recommended for sun protection.  - Call for new or changing lesions.  LENTIGINES, SEBORRHEIC KERATOSES, HEMANGIOMAS - Benign normal skin lesions -  Benign-appearing - Call for any changes  MELANOCYTIC NEVI - Tan-brown and/or pink-flesh-colored symmetric macules and papules - Benign appearing on exam today - Observation - Call clinic for new or changing moles - Recommend daily use of broad spectrum spf 30+ sunscreen to sun-exposed areas.   HISTORY OF BASAL CELL CARCINOMA OF THE SKIN - No evidence of recurrence today - Recommend regular full body skin exams - Recommend daily broad spectrum sunscreen SPF 30+ to sun-exposed areas, reapply every 2 hours as needed.  - Call if any new or changing lesions are noted between office visits  HISTORY OF DYSPLASTIC NEVI No evidence of recurrence today Recommend regular full body skin exams Recommend daily broad spectrum sunscreen SPF 30+ to sun-exposed areas, reapply every 2 hours as needed.  Call if any new or changing lesions are noted between office visits  AK (ACTINIC KERATOSIS) R forehead x 1 Actinic keratoses are precancerous spots that appear secondary to cumulative UV radiation exposure/sun exposure over time. They are chronic with expected duration over 1 year. A portion of actinic keratoses will progress to squamous cell carcinoma of the skin. It is not possible to reliably predict which spots will progress to skin cancer and so treatment is recommended to prevent development of skin cancer.  Recommend daily broad spectrum sunscreen SPF 30+ to sun-exposed areas, reapply every 2 hours as needed.  Recommend staying in the shade or wearing long sleeves, sun glasses (UVA+UVB protection) and wide brim hats (4-inch brim around the entire circumference of the hat). Call for new or changing lesions. Destruction of lesion - R forehead x 1 Complexity: simple   Destruction method: cryotherapy   Informed consent:  discussed and consent obtained   Timeout:  patient name, date of birth, surgical site, and procedure verified Lesion destroyed using liquid nitrogen: Yes   Region frozen until ice  ball extended beyond lesion: Yes   Outcome: patient tolerated procedure well with no complications   Post-procedure details: wound care instructions given   INFLAMED SEBORRHEIC KERATOSIS (4) R cheek x 1, R shoulder x 1, L inframammary x 1, L knee x 1 (4) Symptomatic, irritating, patient would like treated.  Destruction of lesion - R cheek x 1, R shoulder x 1, L inframammary x 1, L knee x 1 (4) Complexity: simple   Destruction method: cryotherapy   Informed consent: discussed and consent obtained   Timeout:  patient name, date of birth, surgical site, and procedure verified Lesion destroyed using liquid nitrogen: Yes   Region frozen until ice ball extended beyond lesion: Yes   Outcome: patient tolerated procedure well with no complications   Post-procedure details: wound care instructions given    HERPESVIRAL INFECTION (COLD SORES) Exam Healing sore on the lower lip. Chronic and persistent condition with duration or expected duration over one year. Condition is bothersome/symptomatic for patient. Currently flared. Herpes Simplex Virus = Cold Sores = Fever Blisters is a chronic recurring blistering; scabbing sore-producing viral infection that is recurrent usually in the same area triggered by stress, sun/UV exposure and trauma.  It is infectious and can be spread from person to person by direct contact.  It is not curable, but is treatable with topical and oral medication. Treatment Plan Pt defers tx today.  Return in about 10 months (around 04/01/2024) for TBSE - Hx BCC, dysplastic nevi.  Arlinda Lais, CMA, am acting as scribe for Celine Collard, MD .   Documentation: I have reviewed the above documentation for accuracy and completeness, and I agree with the above.  Celine Collard, MD

## 2023-06-09 ENCOUNTER — Ambulatory Visit: Payer: PPO | Admitting: Dermatology

## 2023-06-14 ENCOUNTER — Inpatient Hospital Stay: Admit: 2023-06-14 | Discharge: 2023-06-15 | Payer: Medicare (Managed Care)

## 2023-06-14 DIAGNOSIS — I5022 Chronic systolic (congestive) heart failure: Secondary | ICD-10-CM | POA: Diagnosis not present

## 2023-06-14 MED ADMIN — nitroglycerin (NITROLINGUAL) 0.4 mg/dose spray 2 spray: 2 | SUBLINGUAL | @ 13:00:00 | Stop: 2023-06-14

## 2023-06-14 MED ADMIN — iohexol (OMNIPAQUE) 350 mg iodine/mL solution 180 mL: 180 mL | INTRAVENOUS | @ 13:00:00 | Stop: 2023-06-14

## 2023-06-14 MED ADMIN — nitroglycerin (NITROLINGUAL) 0.4 mg/dose spray: SUBLINGUAL | @ 13:00:00 | Stop: 2023-06-14

## 2023-07-05 ENCOUNTER — Ambulatory Visit
Admit: 2023-07-05 | Discharge: 2023-07-06 | Payer: Medicare (Managed Care) | Attending: Nurse Practitioner | Primary: Nurse Practitioner

## 2023-07-05 DIAGNOSIS — I5022 Chronic systolic (congestive) heart failure: Principal | ICD-10-CM

## 2023-07-05 DIAGNOSIS — I427 Cardiomyopathy due to drug and external agent: Principal | ICD-10-CM

## 2023-07-05 DIAGNOSIS — Z9484 Stem cells transplant status: Principal | ICD-10-CM

## 2023-07-05 DIAGNOSIS — C9201 Acute myeloblastic leukemia, in remission: Principal | ICD-10-CM

## 2023-07-05 MED ORDER — LISINOPRIL 2.5 MG TABLET
ORAL_TABLET | Freq: Every day | ORAL | 3 refills | 100.00000 days | Status: CP
Start: 2023-07-05 — End: 2024-08-08
  Filled 2023-07-08: qty 90, 90d supply, fill #0

## 2023-07-05 NOTE — Unmapped (Signed)
 Cardio-oncology Clinic Return Patient Note    Referring Provider: Donnice Saba  [facility]   Primary Provider: Dineen Rollene Eagles, NP   [address] [facility]  [place] [address]       Reason for Visit:  Christine [name] is a 68 y.o. female who returns for ongoing evaluation and management of cardiomyopathy diagnosed in the setting of treatment for AML.    Assessment & Plan:  1. Cardiomyopathy  The etiology of her cardiomyopathy is not completely certain. Her EF dropped to 35% in 2017, possibly related to daunorubicin exposure then normalized by 2019. Today's echo again shows a low EF. The timing of this recrudescence seems unusual for anthracycline induced cardiotoxicity, though if there is no other obvious etiology we may end up ascribing to her chemotherapy exposure. Coronary CTA showed no CAD.     She is tolerating Metoprolol  XL 25mg  daily.  - will start Lisinopril 2.5mg  daily with repeat labs in 1 week  - if she is doing well, then after 2 weeks will increase Metoprolol  XL to 50 mg daily  --echo in 3 months    2. Blood pressure  Her blood pressure was marginally elevated at a clinic visit in 2019 but has been normal since then.  BP today 107/57    3. Left bundle branch block  Transient during chemotherapy    4. Acute myeloid leukemia  S/p SCT  Now 7 years post SCT. Ongoing surveillance [facility].    Follow up  RTC 09/20/23 with Dr. Sheryle and echo same day      History of Present Illness:  Christine [name] returns for ongoing evaluation and management of cardiomyopathy in the setting of treatment for AML. Christine [name] has no history of heart disease of any sort. She always has been very physically active. Prior to diagnosis of AML she regularly walked 3 miles without any chest pain or shortness of breath. She underwent stem cell transplant on February 24, 2016. During treatment for AML she experienced a drop in her ejection fraction that subsequently nearly normalized (2019).     She seen 05/31/23 by Dr. Sheryle. An echo at that visit showed a drop in EF to 25%. CTA ordered to rule out ischemia and she was started on Metoprolol  XL 25mg  daily.     Interval History  Today in clinic, Christine [name] again reports feeling well with complaints. She tolerated the addition of Metoprolol  without any symptoms. She continues to go to the [facility] times weekly where she does a mixture of cardiovascular, flexibility, and weight training. She walks roughly 3 miles most days with her husband a couple of days a week. She denies any chest pain or dyspnea with her activities.  She also denies orthopnea, paroxysmal nocturnal dyspnea, or LE edema. No palpitations, chest pain, dizziness, pre-syncope or syncope.        Cardiovascular History & Procedures:  Cath / PCI:  None  CV Surgery:  None  EP Procedures and Devices:  None    Non-Invasive Evaluation(s):  Echo:  08/28/15  Normal left ventricular systolic function, ejection fraction > 55%  Dilated left atrium - mild  Normal right ventricular systolic function  No significant valvular abnormalities  Degenerative mitral valve disease  Mitral regurgitation - mild    11/04/15  Left ventricular hypertrophy - mild  EF estimated 45-50%, with septal dyskinesis likely due to new LBBB, this is a new finding compared to echo  from 08/2015  Diastolic dysfunction - grade II (elevated filling pressures)  Degenerative mitral valve disease  Dilated left atrium - mild  Aortic sclerosis  Normal right ventricular systolic function    12/03/15 - Limited   Dilated left ventricle - mild  Moderately decreased left ventricular systolic function, ejection fraction 35%  Diastolic dysfunction - grade I (normal filling pressures)  Mitral regurgitation - mild  Normal right ventricular systolic function  Pericardial effusion - small    10/26/16 - Limited   Borderline left ventricular systolic function  Normal right ventricular systolic function    11/29/17 - Limited   Normal left ventricular systolic function, ejection fraction 55 to 60%  Diastolic dysfunction - grade I (normal filling pressures)  Normal right ventricular systolic function     11/28/18 - Limited   Normal left ventricular size and systolic function, ejection fraction 55-60%.  Diastolic dysfunction - grade I (normal filling pressures).  Normal right ventricular size and systolic function.    12/11/19 - Limited   The left ventricle is normal in size with normal wall thickness.  The left ventricular systolic function is overall normal, LVEF is visually estimated at 50-55%.  There is grade I diastolic dysfunction (impaired relaxation).  The right ventricle is normal in size, with normal systolic function.    02/17/21 - Limited  The left ventricle is normal in size with normal wall thickness.  The left ventricular systolic function is normal, LVEF is visually estimated at 55-60%.  The right ventricle is normal in size, with normal systolic function.    05/31/23 - Limited:  The left ventricle is upper normal in size with normal wall thickness.  The left ventricular systolic function is severely decreased, LVEF is visually estimated at 25%.  The right ventricle is normal in size, with normal systolic function.  IVC size and inspiratory change suggest mildly elevated right atrial pressure. (5-10 mmHg).  LV global longitudinal strain: -10.3 %.    CT/MRI/Nuclear Tests:  06/14/23 Cardiac CTA - CAD-RADS 0. No plaque or stenosis; absence of CAD.        Past Medical History:   Diagnosis Date    Allogeneic stem cell transplant (RAF-HCC) 02/24/2016    Conditioning MAC FluBu (BMT CTN 1301- control arm) Donor: Sister, 10/10, CMV negative, ABO: O- Recip: CMV negative, ABO: O- Source: MARROW Cell dose: 3.18 x 10^6 CD34/kg GVHD prophylaxis: Tacrolimus, methotrexate    AML (acute myelogenous leukemia)        Cardiomyopathy        most likely from daunorubicin    DVT (deep venous thrombosis) Hypertension     Osteoporosis     Stopped medications due to interactions with chemo drugs      Hematology/Oncology History Overview Note   Referring/Local Oncologist: none    Diagnosis: de novo AML with mutated FLT3    Genetics:   Karyotype/FISH: 47XX +11     Molecular Genetics: FLT3 ITD present; IDH2: c.419G>A [p.Arg140Gln]     No variants detected in ASXL1, BCOR, CEBPA, DNMT3A, ETV6/TEL, EZH2, FLT3, IDH1, KIT, NPM1, NRAS, RUNX1, SF3B1, SRSF2, STAG2, TET2, TP53, U2AF1, WT1, ZRSR2    Pertinent Phenotypic data: blasts expressed CD4, CD11b, CD11c, CD33, CD34, CD64, CD117, and HLA-DR and lack expression of other informative analyzed markers including B and T-cell antigens.     Disease-specific prognostic estimation: ELN Adverse (per FLT3 allelic ratio high per estimate by molecular pathologist)       Acute myeloid leukemia      08/29/2015 Initial Diagnosis  Acute myeloid leukemia (RAF-HCC)     09/02/2015 -  Chemotherapy    Induction therapy with daunorubicin 60 mg/m2 IV daily x 3, cytarabine 200 mg/m2/d CIV x 7 days, midostaurin 50 mg PO BID days 8-21     09/22/2015 Biopsy    50% cellular marrow with 50% blasts     09/25/2015 -  Chemotherapy    CLAG, cladribine 5 mg/m2/day days 1-5, cytarabine 2 grams/m2 IV days 1-5, GCSF days 0-5     10/23/2015 Remission    CR     11/03/2015 -  Chemotherapy    Cytarabine 1.5 g/m2 IV Q12 hrs days 1, 3, 5     11/04/2015 Adverse Reaction    Cardiomyopathy     11/28/2015 Adverse Reaction    Stenotrophomonas maltophilia CVAD infection     12/19/2015 -  Chemotherapy    Cytarabine 1.5 g/m2 IV Q12 hrs days 1, 3, 5     02/24/2016 Bone Marrow Transplant    CTN 1301 (control arm) conditioning regimen with Busulfan and Fludarabine followed by matched sibling allo, received bone marrow product with cell dose of 3.18 x 10^6.          Past Surgical History:   Procedure Laterality Date    IR INSERT PORT AGE GREATER THAN 5 YRS  12/19/2015    IR INSERT PORT AGE GREATER THAN 5 YRS 12/19/2015 Jed Cap, MD IMG VIR MM MMNT    PR LAP,APPENDECTOMY N/A 11/08/2017    Procedure: LAPAROSCOPY SURGICAL APPENDECTOMY;  Surgeon: Emeline Lamar Dandy, MD;  Location: MAIN OR [facility];  Service: General Surgery    PR UPPER GI ENDOSCOPY,BIOPSY N/A 05/05/2016    Procedure: UGI ENDOSCOPY; WITH BIOPSY, SINGLE OR MULTIPLE;  Surgeon: Donna Rodgers Aland, MD;  Location: GI PROCEDURES [facility];  Service: Gastroenterology    SHOULDER SURGERY       Allergies:  Dapsone, Penicillins, Rasburicase, Sulfasalazine, Vancomycin, Adhesive, Amoxicillin, Cefepime, Morphine, and Tapentadol    Current Medications:  Current Outpatient Medications   Medication Sig Dispense Refill    ascorbic acid , vitamin C , (VITAMIN C ) 500 MG tablet Take by mouth.      calcium  carbonate-vitamin D3 600 mg(1,500mg ) -800 unit Tab Take 1 tablet by mouth two (2) times a day. 60 tablet 5    metoPROLOL  succinate (TOPROL -XL) 25 MG 24 hr tablet Take 1 tablet (25 mg total) by mouth daily. 90 tablet 3    rosuvastatin (CRESTOR) 5 MG tablet Take 1 tablet (5 mg total) by mouth daily.       No current facility-administered medications for this visit.     Facility-Administered Medications Ordered in Other Visits   Medication Dose Route Frequency Provider Last Rate Last Admin    flu vacc qs2019-20 6mos up(PF) (FLUARIX, FLULAVAL, FLUZONE) 60 mcg (15 mcg x 4)/0.5 mL injection             varicella-zoster gE-AS01B (PF) (SHINGRIX ) 50 mcg/0.5 mL injection              Family History:  There is no family history of premature coronary artery disease or sudden cardiac death.     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      Highest level of education: high school     Social Drivers of Health     Financial Resource Strain: Low Risk  (11/02/2021)    Received from [facility]    Overall Financial Resource Strain (CARDIA)     Difficulty of Paying Living Expenses: Not hard at all   Food Insecurity: No Food Insecurity (11/02/2021)    Received from [facility]    Hunger Vital Sign     Within the past 12 months, you worried that your food would run out before you got the money to buy more.: Never true     Within the past 12 months, the food you bought just didn't last and you didn't have money to get more.: Never true   Transportation Needs: No Transportation Needs (11/02/2021)    Received from [facility] - Transportation     Lack of Transportation (Medical): No     Lack of Transportation (Non-Medical): No   Physical Activity: Sufficiently Active (11/02/2021)    Received from [facility]    Exercise Vital Sign     On average, how many days per week do you engage in moderate to strenuous exercise (like a brisk walk)?: 5 days     On average, how many minutes do you engage in exercise at this level?: 60 min   Stress: No Stress Concern Present (11/02/2021)    Received from [facility] - Occupational Stress Questionnaire     Feeling of Stress : Not at all   Social Connections: Unknown (11/02/2021)    Received from [facility]    Social Connection and Isolation Panel     Are you married, widowed, divorced, separated, never married, or living with a partner?: Married     Review of Systems:  A full review of 10 systems is unremarkable except as stated in the HPI.     Physical Exam:  VITAL SIGNS:   Vitals:    07/05/23 1321   BP: 107/57   Pulse: 77   SpO2: 97%      Wt Readings from Last 3 Encounters:   07/05/23 68.4 kg (150 lb 12.8 oz) 05/31/23 68.9 kg (151 lb 12.8 oz)   03/03/21 66.6 kg (146 lb 13.2 oz)      Today's Body mass index is 25.88 kg/m??.   GENERAL: Patient is well-appearing in no acute distress.  HEENT: Normocephalic and atraumatic with anicteric sclerae.  NECK: Supple, JVP flat.   CARDIOVASCULAR: Regular S1S2 without murmur or gallop.  RESPIRATORY: Clear to auscultation without wheezes or rales.   ABDOMEN: Soft, non-tender, non-distended with audible bowel sounds. There is no organomegaly or palpable pulsatile mass.   EXTREMITIES:  Lower extremities are warm and without edema. Distal pulses are full and symmetric.   SKIN: No rashes, ecchymosis or petechiae.  NEURO: Alert, pleasant, and appropriate. Non-focal neuro exam    Pertinent Laboratory Studies:  Lab Results   Component Value Date    PRO-BNP 576.0 (H) 05/31/2023    PRO-BNP  400.0 (H) 06/08/2017    PRO-BNP 1,120.0 (H) 11/04/2015    Creatinine 0.83 05/31/2023    Creatinine 0.81 (H) 03/03/2021    BUN 10 05/31/2023    BUN 10 03/03/2021    Potassium 4.5 05/31/2023    Potassium 4.4 03/03/2021    Potassium, Bld 3.4 06/08/2017    Magnesium  1.9 03/11/2020    Magnesium  2.1 11/24/2017    AST 18 05/31/2023    AST 22 03/03/2021    ALT 15 05/31/2023    ALT 15 03/03/2021    Total Bilirubin 0.4 05/31/2023    Total Bilirubin 0.5 03/03/2021    INR 1.06 11/24/2017    INR 1.22 04/15/2016    WBC 3.7 03/03/2021    WBC <0.1 (LL) 03/05/2016    HGB 12.4 03/03/2021    Hemoglobin 10.9 (L) 06/08/2017    HCT 37.3 03/03/2021    Platelet 262 03/03/2021       Other pertinent records were reviewed.    Pertinent Test Results from Today:  None

## 2023-07-05 NOTE — Unmapped (Signed)
 Today,    MEDICATIONS:  We are changing your medications today.  - start Lisinopril 2.5mg  once daily  - if you feel well after 2 weeks, then increase the Metoprolol  to 50mg  (2 tabs)    Call if you have questions about your medications.    LABS:  Get labs about a week after starting the Lisinopril    NEXT APPOINTMENT:  Return to clinic in 3 months with Dr. Sheryle as scheduled      In general, to take care of your heart failure:  -Limit your fluid intake to 2 Liters (half-gallon) per day.    -Limit your salt intake to ideally 2-3 grams (2000-3000 mg) per day.  -Weigh yourself daily and record, and bring that weight diary to your next appointment.  (Weight gain of 2-3 pounds in 1 day typically means fluid weight.)    The medications for your heart are to help your heart and help you live longer.    Please contact us  before stopping any of your heart medications.    Please call Elenor Mulch, RN, heart failure coordinator, at [phone] if you have questions, concerns or need assistance with anything.  OR  Call the clinic at [phone] with questions.  If you need to reschedule future appointments, please call [phone] or [phone]  After office hours, if you have urgent questions/problems, contact the on-call cardiologist through the hospital operator: [phone].    Our clinic fax number is [phone].    Please do not send a MyChart message for potentially life-threatening symptoms.  Please call 911 for a true medical emergency.

## 2023-07-21 ENCOUNTER — Encounter
Admit: 2023-07-21 | Discharge: 2023-07-22 | Payer: Medicare (Managed Care) | Attending: Nurse Practitioner | Primary: Nurse Practitioner

## 2023-07-21 ENCOUNTER — Ambulatory Visit: Admit: 2023-07-21 | Discharge: 2023-07-22 | Payer: Medicare (Managed Care)

## 2023-07-21 DIAGNOSIS — I5022 Chronic systolic (congestive) heart failure: Secondary | ICD-10-CM | POA: Diagnosis not present

## 2023-07-21 DIAGNOSIS — I427 Cardiomyopathy due to drug and external agent: Secondary | ICD-10-CM | POA: Diagnosis not present

## 2023-07-21 LAB — BASIC METABOLIC PANEL
ANION GAP: 11 mmol/L (ref 5–14)
BLOOD UREA NITROGEN: 13 mg/dL (ref 9–23)
BUN / CREAT RATIO: 15
CALCIUM: 10.5 mg/dL — ABNORMAL HIGH (ref 8.7–10.4)
CHLORIDE: 104 mmol/L (ref 98–107)
CO2: 29.8 mmol/L (ref 20.0–31.0)
CREATININE: 0.85 mg/dL (ref 0.55–1.02)
EGFR CKD-EPI (2021) FEMALE: 74 mL/min/1.73m2 (ref >=60)
GLUCOSE RANDOM: 84 mg/dL (ref 70–179)
POTASSIUM: 5.1 mmol/L — ABNORMAL HIGH (ref 3.4–4.8)
SODIUM: 145 mmol/L (ref 135–145)

## 2023-07-21 LAB — PRO-BNP: PRO-BNP: 340 pg/mL — ABNORMAL HIGH (ref <=300.0)

## 2023-07-27 MED ORDER — METOPROLOL SUCCINATE ER 50 MG TABLET,EXTENDED RELEASE 24 HR
ORAL_TABLET | Freq: Every day | ORAL | 3 refills | 90.00000 days | Status: CP
Start: 2023-07-27 — End: 2024-08-30
  Filled 2023-07-29: qty 90, 90d supply, fill #0

## 2023-08-23 ENCOUNTER — Encounter: Payer: Self-pay | Admitting: Family

## 2023-08-23 ENCOUNTER — Ambulatory Visit: Payer: Self-pay | Admitting: Family

## 2023-08-23 ENCOUNTER — Ambulatory Visit: Payer: PPO | Admitting: Family

## 2023-08-23 VITALS — BP 122/76 | HR 76 | Temp 97.9°F | Ht 64.0 in | Wt 150.6 lb

## 2023-08-23 DIAGNOSIS — Z1211 Encounter for screening for malignant neoplasm of colon: Secondary | ICD-10-CM

## 2023-08-23 DIAGNOSIS — Z1231 Encounter for screening mammogram for malignant neoplasm of breast: Secondary | ICD-10-CM | POA: Diagnosis not present

## 2023-08-23 DIAGNOSIS — Z780 Asymptomatic menopausal state: Secondary | ICD-10-CM

## 2023-08-23 DIAGNOSIS — Z0000 Encounter for general adult medical examination without abnormal findings: Secondary | ICD-10-CM

## 2023-08-23 DIAGNOSIS — I700 Atherosclerosis of aorta: Secondary | ICD-10-CM

## 2023-08-23 DIAGNOSIS — I429 Cardiomyopathy, unspecified: Secondary | ICD-10-CM | POA: Diagnosis not present

## 2023-08-23 LAB — COMPREHENSIVE METABOLIC PANEL WITH GFR
ALT: 17 U/L (ref 0–35)
AST: 16 U/L (ref 0–37)
Albumin: 4.6 g/dL (ref 3.5–5.2)
Alkaline Phosphatase: 80 U/L (ref 39–117)
BUN: 9 mg/dL (ref 6–23)
CO2: 30 meq/L (ref 19–32)
Calcium: 9.7 mg/dL (ref 8.4–10.5)
Chloride: 104 meq/L (ref 96–112)
Creatinine, Ser: 0.74 mg/dL (ref 0.40–1.20)
GFR: 82.03 mL/min (ref >60.00)
Glucose, Bld: 96 mg/dL (ref 70–99)
Potassium: 5.1 meq/L (ref 3.5–5.1)
Sodium: 141 meq/L (ref 135–145)
Total Bilirubin: 0.5 mg/dL (ref 0.2–1.2)
Total Protein: 7 g/dL (ref 6.0–8.3)

## 2023-08-23 LAB — CBC WITH DIFFERENTIAL/PLATELET
Basophils Absolute: 0 K/uL (ref 0.0–0.1)
Basophils Relative: 0.3 % (ref 0.0–3.0)
Eosinophils Absolute: 0.1 K/uL (ref 0.0–0.7)
Eosinophils Relative: 2.2 % (ref 0.0–5.0)
HCT: 39.3 % (ref 36.0–46.0)
Hemoglobin: 13 g/dL (ref 12.0–15.0)
Lymphocytes Relative: 31.7 % (ref 12.0–46.0)
Lymphs Abs: 1.5 K/uL (ref 0.7–4.0)
MCHC: 33.1 g/dL (ref 30.0–36.0)
MCV: 93.4 fl (ref 78.0–100.0)
Monocytes Absolute: 0.4 K/uL (ref 0.1–1.0)
Monocytes Relative: 8.9 % (ref 3.0–12.0)
Neutro Abs: 2.7 K/uL (ref 1.4–7.7)
Neutrophils Relative %: 56.9 % (ref 43.0–77.0)
Platelets: 291 K/uL (ref 150.0–400.0)
RBC: 4.21 Mil/uL (ref 3.87–5.11)
RDW: 14 % (ref 11.5–15.5)
WBC: 4.8 K/uL (ref 4.0–10.5)

## 2023-08-23 LAB — LIPID PANEL
Cholesterol: 175 mg/dL (ref 0–200)
HDL: 55.3 mg/dL (ref >39.00)
LDL Cholesterol: 87 mg/dL (ref 0–99)
NonHDL: 120.19
Total CHOL/HDL Ratio: 3
Triglycerides: 164 mg/dL — ABNORMAL HIGH (ref 0.0–149.0)
VLDL: 32.8 mg/dL (ref 0.0–40.0)

## 2023-08-23 LAB — HEMOGLOBIN A1C: Hgb A1c MFr Bld: 6.3 % (ref 4.6–6.5)

## 2023-08-23 LAB — TSH: TSH: 1.02 u[IU]/mL (ref 0.35–5.50)

## 2023-08-23 LAB — VITAMIN D 25 HYDROXY (VIT D DEFICIENCY, FRACTURES): VITD: 36.39 ng/mL (ref 30.00–100.00)

## 2023-08-23 MED ORDER — ROSUVASTATIN CALCIUM 5 MG PO TABS: 5.0000 mg | ORAL_TABLET | Freq: Every day | ORAL | 3 refills | Status: AC

## 2023-08-23 NOTE — Progress Notes (Signed)
 Assessment & Plan:  Routine general medical examination at a health care facility Assessment & Plan: Clinical breast exam performed today.  Deferred pelvic exam in the absence of complaints.  Previous Pap smear was normal in 2024.  Patient I discussed guidelines at the age of 33 in which we may stop screening for cervical cancer.  Referral for repeat colonoscopy is due later this year.  Congratulated patient on diligence to exercise.    Orders: -     VITAMIN D  25 Hydroxy (Vit-D Deficiency, Fractures) -     Hemoglobin A1c -     CBC with Differential/Platelet -     Comprehensive metabolic panel with GFR -     Lipid panel -     TSH  Screening for colon cancer  Encounter for screening mammogram for malignant neoplasm of breast -     3D Screening Mammogram, Left and Right; Future  Atherosclerosis of aorta (HCC) -     Rosuvastatin  Calcium ; Take 1 tablet (5 mg total) by mouth daily.  Dispense: 90 tablet; Refill: 3  Screen for colon cancer -     Ambulatory referral to Gastroenterology  Postmenopausal estrogen deficiency -     DG Bone Density; Future  Secondary cardiomyopathy [facility]) Assessment & Plan: Discussed recent unexpected decrease in LVEF, no known etiology at this time.  She has follow-up echocardiogram scheduled.  Will follow.  Continue lisinopril, metoprolol as managed by cardiology      Return precautions given.   Risks, benefits, and alternatives of the medications and treatment plan prescribed today were discussed, and patient expressed understanding.   Education regarding symptom management and diagnosis given to patient on AVS either electronically or printed.  No follow-ups on file.  Rollene Northern, FNP  Subjective:    Patient ID: Robyn Montgomery, female    DOB: 09/19/1955, 68 y.o.   MRN: [id number]  CC: Robyn Montgomery is a 68 y.o. female who presents today for physical exam.    HPI: HPI Discussed the use of AI scribe software for clinical note transcription  with the patient, who gave verbal consent to proceed.  History of Present Illness   Robyn Montgomery is a 68 year old female who presents for an annual physical exam.  She mentions a significant decrease in her ejection fraction noted during an echocardiogram performed at [facility] 05/31/23. She denies shortness of breath, chest pain, fatigue, leg swelling.  She was started on metoprolol 5 mg, which was later increased to 10 mg, and lisinopril 2.5 mg by her cardiologist Dr Sheryle and NP Lauraine Samples. She is scheduled for a follow-up echocardiogram next month.  Her ProBNP levels were elevated 576 decreased to 340.  She maintains an active lifestyle, engaging in Pilates, yoga, ballet-type classes six days a week, walking two to three times a week for 50 minutes to an hour, and participating in a step class once a week. No changes in her stamina or exercise tolerance.  Colorectal Cancer Screening: Dr aundria 04/24/19. Per Dr Geryl, due for a repeat colonoscopy until 11/2023 since you did not have a first-degree family history of colon cancer/polyps  Breast Cancer Screening: Mammogram UTD Cervical Cancer Screening: No longer screening 08/18/22 NILM, neg HPV Bone Health screening/DEXA for 65+:due  Lung Cancer Screening: Doesn't have 20 year pack year history and age > 22 years yo 89 years       Tetanus - UTD Exercise: Gets regular exercise.   Alcohol use:  none Smoking/tobacco use: Nonsmoker.  Health Maintenance  Topic Date Due   COVID-19 Vaccine (6 - 2024-25 season) 09/12/2022   Medicare Annual Wellness Visit  11/03/2022   Pap Smear  08/18/2023   Colon Cancer Screening  11/16/2023   Flu Shot  04/10/2024*   Mammogram  09/15/2023   DTaP/Tdap/Td vaccine (5 - Tdap) 03/01/2028   Pneumococcal Vaccine for age over 43  Completed   Hepatitis B Vaccine  Completed   DEXA scan (bone density measurement)  Completed   Hepatitis C Screening  Completed   Zoster (Shingles) Vaccine  Completed   HPV  Vaccine  Aged Out   Meningitis B Vaccine  Aged Out  *Topic was postponed. The date shown is not the original due date.    ALLERGIES: Dapsone, Rasburicase, Sulfasalazine, Penicillins, Sulfa antibiotics, Vancomycin, Cefepime, Morphine, Tape, and Tapentadol  Current Outpatient Medications on File Prior to Visit  Medication Sig Dispense Refill   ascorbic acid (VITAMIN C) 500 MG tablet Take by mouth.     Calcium  Carb-Cholecalciferol 600-800 MG-UNIT TABS Take by mouth.     lisinopril (ZESTRIL) 2.5 MG tablet Take 2.5 mg by mouth daily.     metoprolol succinate (TOPROL-XL) 50 MG 24 hr tablet Take 50 mg by mouth daily. Take with or immediately following a meal.     No current facility-administered medications on file prior to visit.    Review of Systems  Constitutional:  Negative for chills, fever and unexpected weight change.  HENT:  Negative for congestion.   Respiratory:  Negative for cough and shortness of breath.   Cardiovascular:  Negative for chest pain, palpitations and leg swelling.  Gastrointestinal:  Negative for nausea and vomiting.  Musculoskeletal:  Negative for arthralgias and myalgias.  Skin:  Negative for rash.  Neurological:  Negative for headaches.  Hematological:  Negative for adenopathy.  Psychiatric/Behavioral:  Negative for confusion.       Objective:    BP 122/76   Pulse 76   Temp 97.9 F (36.6 C) (Oral)   Ht 5' 4 (1.626 m)   Wt 150 lb 9.6 oz (68.3 kg)   SpO2 99%   BMI 25.85 kg/m   BP Readings from Last 3 Encounters:  08/23/23 122/76  08/18/22 118/70  08/17/22 (!) 150/93   Wt Readings from Last 3 Encounters:  08/23/23 150 lb 9.6 oz (68.3 kg)  08/18/22 145 lb 12.8 oz (66.1 kg)  08/17/22 150 lb (68 kg)    Physical Exam Vitals reviewed.  Constitutional:      Appearance: Normal appearance. She is well-developed.  Eyes:     Conjunctiva/sclera: Conjunctivae normal.  Neck:     Thyroid : No thyroid  mass or thyromegaly.  Cardiovascular:     Rate and  Rhythm: Normal rate and regular rhythm.     Pulses: Normal pulses.     Heart sounds: Normal heart sounds.  Pulmonary:     Effort: Pulmonary effort is normal.     Breath sounds: Normal breath sounds. No wheezing, rhonchi or rales.  Chest:  Breasts:    Breasts are symmetrical.     Right: No inverted nipple, mass, nipple discharge, skin change or tenderness.     Left: No inverted nipple, mass, nipple discharge, skin change or tenderness.  Abdominal:     General: Bowel sounds are normal. There is no distension.     Palpations: Abdomen is soft. Abdomen is not rigid. There is no fluid wave or mass.     Tenderness: There is no abdominal tenderness. There is no guarding or  rebound.  Lymphadenopathy:     Head:     Right side of head: No submental, submandibular, tonsillar, preauricular, posterior auricular or occipital adenopathy.     Left side of head: No submental, submandibular, tonsillar, preauricular, posterior auricular or occipital adenopathy.     Cervical: No cervical adenopathy.     Right cervical: No superficial, deep or posterior cervical adenopathy.    Left cervical: No superficial, deep or posterior cervical adenopathy.  Skin:    General: Skin is warm and dry.  Neurological:     Mental Status: She is alert.  Psychiatric:        Speech: Speech normal.        Behavior: Behavior normal.        Thought Content: Thought content normal.

## 2023-08-24 NOTE — Patient Instructions (Signed)
 Health Maintenance for Postmenopausal Women Menopause is a normal process in which your ability to get pregnant comes to an end. This process happens slowly over many months or years, usually between the ages of 76 and 38. Menopause is complete when you have missed your menstrual period for 12 months. It is important to talk with your health care provider about some of the most common conditions that affect women after menopause (postmenopausal women). These include heart disease, cancer, and bone loss (osteoporosis). Adopting a healthy lifestyle and getting preventive care can help to promote your health and wellness. The actions you take can also lower your chances of developing some of these common conditions. What are the signs and symptoms of menopause? During menopause, you may have the following symptoms: Hot flashes. These can be moderate or severe. Night sweats. Decrease in sex drive. Mood swings. Headaches. Tiredness (fatigue). Irritability. Memory problems. Problems falling asleep or staying asleep. Talk with your health care provider about treatment options for your symptoms. Do I need hormone replacement therapy? Hormone replacement therapy is effective in treating symptoms that are caused by menopause, such as hot flashes and night sweats. Hormone replacement carries certain risks, especially as you become older. If you are thinking about using estrogen or estrogen with progestin, discuss the benefits and risks with your health care provider. How can I reduce my risk for heart disease and stroke? The risk of heart disease, heart attack, and stroke increases as you age. One of the causes may be a change in the body's hormones during menopause. This can affect how your body uses dietary fats, triglycerides, and cholesterol. Heart attack and stroke are medical emergencies. There are many things that you can do to help prevent heart disease and stroke. Watch your blood pressure High  blood pressure causes heart disease and increases the risk of stroke. This is more likely to develop in people who have high blood pressure readings or are overweight. Have your blood pressure checked: Every 3-5 years if you are 32-23 years of age. Every year if you are 31 years old or older. Eat a healthy diet  Eat a diet that includes plenty of vegetables, fruits, low-fat dairy products, and lean protein. Do not eat a lot of foods that are high in solid fats, added sugars, or sodium. Get regular exercise Get regular exercise. This is one of the most important things you can do for your health. Most adults should: Try to exercise for at least 150 minutes each week. The exercise should increase your heart rate and make you sweat (moderate-intensity exercise). Try to do strengthening exercises at least twice each week. Do these in addition to the moderate-intensity exercise. Spend less time sitting. Even light physical activity can be beneficial. Other tips Work with your health care provider to achieve or maintain a healthy weight. Do not use any products that contain nicotine or tobacco. These products include cigarettes, chewing tobacco, and vaping devices, such as e-cigarettes. If you need help quitting, ask your health care provider. Know your numbers. Ask your health care provider to check your cholesterol and your blood sugar (glucose). Continue to have your blood tested as directed by your health care provider. Do I need screening for cancer? Depending on your health history and family history, you may need to have cancer screenings at different stages of your life. This may include screening for: Breast cancer. Cervical cancer. Lung cancer. Colorectal cancer. What is my risk for osteoporosis? After menopause, you may be  at increased risk for osteoporosis. Osteoporosis is a condition in which bone destruction happens more quickly than new bone creation. To help prevent osteoporosis or  the bone fractures that can happen because of osteoporosis, you may take the following actions: If you are 24-54 years old, get at least 1,000 mg of calcium and at least 600 international units (IU) of vitamin D  per day. If you are older than age 75 but younger than age 30, get at least 1,200 mg of calcium and at least 600 international units (IU) of vitamin D  per day. If you are older than age 8, get at least 1,200 mg of calcium and at least 800 international units (IU) of vitamin D  per day. Smoking and drinking excessive alcohol increase the risk of osteoporosis. Eat foods that are rich in calcium and vitamin D , and do weight-bearing exercises several times each week as directed by your health care provider. How does menopause affect my mental health? Depression may occur at any age, but it is more common as you become older. Common symptoms of depression include: Feeling depressed. Changes in sleep patterns. Changes in appetite or eating patterns. Feeling an overall lack of motivation or enjoyment of activities that you previously enjoyed. Frequent crying spells. Talk with your health care provider if you think that you are experiencing any of these symptoms. General instructions See your health care provider for regular wellness exams and vaccines. This may include: Scheduling regular health, dental, and eye exams. Getting and maintaining your vaccines. These include: Influenza vaccine. Get this vaccine each year before the flu season begins. Pneumonia vaccine. Shingles vaccine. Tetanus, diphtheria, and pertussis (Tdap) booster vaccine. Your health care provider may also recommend other immunizations. Tell your health care provider if you have ever been abused or do not feel safe at home. Summary Menopause is a normal process in which your ability to get pregnant comes to an end. This condition causes hot flashes, night sweats, decreased interest in sex, mood swings, headaches, or lack  of sleep. Treatment for this condition may include hormone replacement therapy. Take actions to keep yourself healthy, including exercising regularly, eating a healthy diet, watching your weight, and checking your blood pressure and blood sugar levels. Get screened for cancer and depression. Make sure that you are up to date with all your vaccines. This information is not intended to replace advice given to you by your health care provider. Make sure you discuss any questions you have with your health care provider. Document Revised: 05/19/2020 Document Reviewed: 05/19/2020 Elsevier Patient Education  2024 [employer].

## 2023-08-24 NOTE — Assessment & Plan Note (Signed)
 Clinical breast exam performed today.  Deferred pelvic exam in the absence of complaints.  Previous Pap smear was normal in 2024.  Patient I discussed guidelines at the age of 29 in which we may stop screening for cervical cancer.  Referral for repeat colonoscopy is due later this year.  Congratulated patient on diligence to exercise.

## 2023-08-24 NOTE — Assessment & Plan Note (Signed)
 Discussed recent unexpected decrease in LVEF, no known etiology at this time.  She has follow-up echocardiogram scheduled.  Will follow.  Continue lisinopril, metoprolol as managed by cardiology

## 2023-09-20 ENCOUNTER — Inpatient Hospital Stay: Admit: 2023-09-20 | Discharge: 2023-09-20 | Payer: Medicare (Managed Care)

## 2023-09-20 ENCOUNTER — Ambulatory Visit
Admit: 2023-09-20 | Discharge: 2023-09-20 | Payer: Medicare (Managed Care) | Attending: Cardiovascular Disease | Primary: Cardiovascular Disease

## 2023-09-20 DIAGNOSIS — I5022 Chronic systolic (congestive) heart failure: Principal | ICD-10-CM

## 2023-09-20 DIAGNOSIS — I427 Cardiomyopathy due to drug and external agent: Principal | ICD-10-CM

## 2023-09-20 DIAGNOSIS — C9201 Acute myeloblastic leukemia, in remission: Principal | ICD-10-CM

## 2023-09-20 DIAGNOSIS — Z9484 Stem cells transplant status: Principal | ICD-10-CM

## 2023-09-20 DIAGNOSIS — C9200 Acute myeloblastic leukemia, not having achieved remission: Secondary | ICD-10-CM | POA: Diagnosis not present

## 2023-09-20 DIAGNOSIS — Z79899 Other long term (current) drug therapy: Secondary | ICD-10-CM | POA: Diagnosis not present

## 2023-09-20 DIAGNOSIS — I110 Hypertensive heart disease with heart failure: Secondary | ICD-10-CM | POA: Diagnosis not present

## 2023-09-20 DIAGNOSIS — Z9481 Bone marrow transplant status: Secondary | ICD-10-CM | POA: Diagnosis not present

## 2023-09-20 DIAGNOSIS — Z880 Allergy status to penicillin: Secondary | ICD-10-CM | POA: Diagnosis not present

## 2023-09-20 DIAGNOSIS — T451X5A Adverse effect of antineoplastic and immunosuppressive drugs, initial encounter: Secondary | ICD-10-CM | POA: Diagnosis not present

## 2023-09-20 DIAGNOSIS — Z9221 Personal history of antineoplastic chemotherapy: Secondary | ICD-10-CM | POA: Diagnosis not present

## 2023-09-20 DIAGNOSIS — I447 Left bundle-branch block, unspecified: Secondary | ICD-10-CM | POA: Diagnosis not present

## 2023-09-20 DIAGNOSIS — Z86718 Personal history of other venous thrombosis and embolism: Secondary | ICD-10-CM | POA: Diagnosis not present

## 2023-09-20 DIAGNOSIS — Z882 Allergy status to sulfonamides status: Secondary | ICD-10-CM | POA: Diagnosis not present

## 2023-09-20 DIAGNOSIS — R001 Bradycardia, unspecified: Secondary | ICD-10-CM | POA: Diagnosis not present

## 2023-09-20 MED ORDER — SACUBITRIL 24 MG-VALSARTAN 26 MG TABLET
ORAL_TABLET | Freq: Two times a day (BID) | ORAL | 4 refills | 60.00000 days | Status: CP
Start: 2023-09-20 — End: 2024-10-24
  Filled 2023-09-21: qty 120, 60d supply, fill #0

## 2023-09-20 NOTE — Unmapped (Signed)
 Cardio-oncology Clinic Return Patient Note    Referring Provider: Donnice Saba  [facility]   Primary Provider: Dineen Rollene Eagles, NP   [address] [facility]  [place] [address]       Reason for Visit:  Christine [name] is a 68 y.o. female who returns for ongoing evaluation and management of cardiomyopathy diagnosed in the setting of treatment for AML.    Assessment & Plan:  1. Cardiomyopathy  The etiology of her cardiomyopathy is not completely certain. Her EF dropped to 35% in 2017, possibly related to daunorubicin exposure then normalized by 2019. A surveillance echocardiogram in May 2025 showed an EF of 20-25%. The timing of this recrudescence seems unusual for anthracycline induced cardiotoxicity, though if there is no other obvious etiology we may end up ascribing to her chemotherapy exposure. Coronary CTA showed no CAD. She appears euvolemic and reports NYHA Class I symptoms. She is quite hesitant to pursue medical therapy.  - replace lisinopril  with Entresto  24/26 mg bid. She understands the importance of the 48h washout period  - continue metoprolol  XL 50 mg daily (further titration likely limited by HR)  --echo in 3 months    2. Blood pressure  Her blood pressure was marginally elevated at a clinic visit in 2019 but has been normal since then. She will follow her BP at home after the transition from lisinopril  to Entresto .    3. Left bundle branch block  Transient during chemotherapy    4. Acute myeloid leukemia  S/p SCT  Now 10 years post SCT. Ongoing surveillance per [facility] team.    Follow up  RTC in 3 months with an echo      History of Present Illness:  Christine [name] returns for ongoing evaluation and management of cardiomyopathy in the setting of treatment for AML. Christine [name] has no history of heart disease of any sort. She always has been very physically active. Prior to diagnosis of AML she regularly walked 3 miles without any chest pain or shortness of breath. She underwent stem cell transplant on February 24, 2016. During treatment for AML she experienced a drop in her ejection fraction that subsequently normalized (2019). Unfortunately a surveillance echo in May 2025 showed a drop in EF to 25%. She most recently was seen by Lauraine Samples for GDMT titration.    Interval History  Today in clinic, Christine [name] again reports feeling well with complaints. She tolerated the addition of metoprolol  followed by lisinopril  without any symptoms. She continues to go to the [facility] 6 times weekly where she does a mixture of cardiovascular, flexibility, and weight training. She walks roughly 3 miles most days with her husband a couple of days a week, including a hilly route with no DOE. She denies any chest pain or dyspnea with her activities.  She also denies orthopnea, paroxysmal nocturnal dyspnea, or LE edema. No palpitations, chest pain, dizziness, pre-syncope or syncope.        Cardiovascular History & Procedures:  Cath / PCI:  None  CV Surgery:  None  EP Procedures and Devices:  None    Non-Invasive Evaluation(s):  Echo:  08/28/15  Normal left ventricular systolic function, ejection fraction > 55%  Dilated left atrium - mild  Normal right ventricular systolic function  No significant valvular abnormalities  Degenerative mitral valve disease  Mitral regurgitation - mild    11/04/15  Left ventricular hypertrophy - mild  EF estimated 45-50%, with septal  dyskinesis likely due to new LBBB, this is a new finding compared to echo from 08/2015  Diastolic dysfunction - grade II (elevated filling pressures)  Degenerative mitral valve disease  Dilated left atrium - mild  Aortic sclerosis  Normal right ventricular systolic function    12/03/15 - Limited   Dilated left ventricle - mild  Moderately decreased left ventricular systolic function, ejection fraction 35%  Diastolic dysfunction - grade I (normal filling pressures)  Mitral regurgitation - mild  Normal right ventricular systolic function  Pericardial effusion - small    10/26/16 - Limited   Borderline left ventricular systolic function  Normal right ventricular systolic function    11/29/17 - Limited   Normal left ventricular systolic function, ejection fraction 55 to 60%  Diastolic dysfunction - grade I (normal filling pressures)  Normal right ventricular systolic function     11/28/18 - Limited   Normal left ventricular size and systolic function, ejection fraction 55-60%.  Diastolic dysfunction - grade I (normal filling pressures).  Normal right ventricular size and systolic function.    12/11/19 - Limited   The left ventricle is normal in size with normal wall thickness.  The left ventricular systolic function is overall normal, LVEF is visually estimated at 50-55%.  There is grade I diastolic dysfunction (impaired relaxation).  The right ventricle is normal in size, with normal systolic function.    02/17/21 - Limited  The left ventricle is normal in size with normal wall thickness.  The left ventricular systolic function is normal, LVEF is visually estimated at 55-60%.  The right ventricle is normal in size, with normal systolic function.    05/31/23 - Limited:  The left ventricle is upper normal in size with normal wall thickness.  The left ventricular systolic function is severely decreased, LVEF is visually estimated at 25%.  The right ventricle is normal in size, with normal systolic function.  IVC size and inspiratory change suggest mildly elevated right atrial pressure. (5-10 mmHg).  LV global longitudinal strain: -10.3 %.    CT/MRI/Nuclear Tests:  06/14/23 Cardiac CTA - CAD-RADS 0. No plaque or stenosis; absence of CAD.        Past Medical History:   Diagnosis Date    Allogeneic stem cell transplant (RAF-HCC) 02/24/2016    Conditioning MAC FluBu (BMT CTN 1301- control arm) Donor: Sister, 10/10, CMV negative, ABO: O- Recip: CMV negative, ABO: O- Source: MARROW Cell dose: 3.18 x 10^6 CD34/kg GVHD prophylaxis: Tacrolimus, methotrexate    AML (acute myelogenous leukemia)    (CMS-HCC)     Cardiomyopathy    (CMS-HCC)     most likely from daunorubicin    DVT (deep venous thrombosis)    (CMS-HCC)     Hypertension     Osteoporosis     Stopped medications due to interactions with chemo drugs      Hematology/Oncology History Overview Note   Referring/Local Oncologist: none    Diagnosis: de novo AML with mutated FLT3    Genetics:   Karyotype/FISH: 47XX +11     Molecular Genetics: FLT3 ITD present; IDH2: c.419G>A [p.Arg140Gln]     No variants detected in ASXL1, BCOR, CEBPA, DNMT3A, ETV6/TEL, EZH2, FLT3, IDH1, KIT, NPM1, NRAS, RUNX1, SF3B1, SRSF2, STAG2, TET2, TP53, U2AF1, WT1, ZRSR2    Pertinent Phenotypic data: blasts expressed CD4, CD11b, CD11c, CD33, CD34, CD64, CD117, and HLA-DR and lack expression of other informative analyzed markers including B and T-cell antigens.     Disease-specific prognostic estimation: ELN Adverse (per FLT3 allelic  ratio high per estimate by molecular pathologist)       Acute myeloid leukemia    (CMS-HCC)   08/29/2015 Initial Diagnosis    Acute myeloid leukemia (RAF-HCC)     09/02/2015 -  Chemotherapy    Induction therapy with daunorubicin 60 mg/m2 IV daily x 3, cytarabine 200 mg/m2/d CIV x 7 days, midostaurin 50 mg PO BID days 8-21     09/22/2015 Biopsy    50% cellular marrow with 50% blasts     09/25/2015 -  Chemotherapy    CLAG, cladribine 5 mg/m2/day days 1-5, cytarabine 2 grams/m2 IV days 1-5, GCSF days 0-5     10/23/2015 Remission    CR     11/03/2015 -  Chemotherapy    Cytarabine 1.5 g/m2 IV Q12 hrs days 1, 3, 5     11/04/2015 Adverse Reaction    Cardiomyopathy     11/28/2015 Adverse Reaction    Stenotrophomonas maltophilia CVAD infection     12/19/2015 -  Chemotherapy    Cytarabine 1.5 g/m2 IV Q12 hrs days 1, 3, 5     02/24/2016 Bone Marrow Transplant    CTN 1301 (control arm) conditioning regimen with Busulfan and Fludarabine followed by matched sibling allo, received bone marrow product with cell dose of 3.18 x 10^6.          Past Surgical History:   Procedure Laterality Date    IR INSERT PORT AGE GREATER THAN 5 YRS  12/19/2015    IR INSERT PORT AGE GREATER THAN 5 YRS 12/19/2015 Jed Cap, MD IMG VIR MM MMNT    PR LAP,APPENDECTOMY N/A 11/08/2017    Procedure: LAPAROSCOPY SURGICAL APPENDECTOMY;  Surgeon: Emeline Lamar Dandy, MD;  Location: MAIN OR [facility];  Service: General Surgery    PR UPPER GI ENDOSCOPY,BIOPSY N/A 05/05/2016    Procedure: UGI ENDOSCOPY; WITH BIOPSY, SINGLE OR MULTIPLE;  Surgeon: Donna Rodgers Aland, MD;  Location: GI PROCEDURES [facility];  Service: Gastroenterology    SHOULDER SURGERY       Allergies:  Dapsone, Penicillins, Rasburicase, Sulfasalazine, Vancomycin, Adhesive, Amoxicillin, Cefepime, Morphine, and Tapentadol    Current Medications:  Current Outpatient Medications   Medication Sig Dispense Refill    ascorbic acid , vitamin C , (VITAMIN C ) 500 MG tablet Take by mouth.      calcium  carbonate-vitamin D3 600 mg(1,500mg ) -800 unit Tab Take 1 tablet by mouth two (2) times a day. 60 tablet 5    lisinopril  (PRINIVIL ,ZESTRIL ) 2.5 MG tablet Take 1 tablet (2.5 mg total) by mouth daily. 100 tablet 3    metoPROLOL  succinate (TOPROL -XL) 50 MG 24 hr tablet Take 1 tablet (50 mg total) by mouth daily. 90 tablet 3    rosuvastatin (CRESTOR) 5 MG tablet Take 1 tablet (5 mg total) by mouth daily.       No current facility-administered medications for this visit.     Facility-Administered Medications Ordered in Other Visits   Medication Dose Route Frequency Provider Last Rate Last Admin    flu vacc qs2019-20 6mos up(PF) (FLUARIX, FLULAVAL, FLUZONE) 60 mcg (15 mcg x 4)/0.5 mL injection             varicella-zoster gE-AS01B (PF) (SHINGRIX ) 50 mcg/0.5 mL injection              Family History:  There is no family history of premature coronary artery disease or sudden cardiac death.     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      Highest level of education: high school     Social Drivers of Health     Financial Resource Strain: Low Risk  (11/02/2021)    Received from [facility]    Overall Financial Resource Strain (CARDIA)     Difficulty of Paying Living Expenses: Not hard at all   Food Insecurity: No Food Insecurity (11/02/2021)    Received from [facility]    Hunger Vital Sign     Within the past 12 months, you worried that your food would run out before you got the money to buy more.: Never true     Within the past 12 months, the food you bought just didn't last and you didn't have money to get more.: Never true   Transportation Needs: No Transportation Needs (11/02/2021)    Received from [facility] - Transportation     Lack of Transportation (Medical): No     Lack of Transportation (Non-Medical): No   Physical Activity: Sufficiently Active (11/02/2021)    Received from [facility]    Exercise Vital Sign     On average, how many days per week do you engage in moderate to strenuous exercise (like a brisk walk)?: 5 days     On average, how many minutes do you engage in exercise at this level?: 60 min   Stress: No Stress Concern Present (11/02/2021)    Received from [facility] - Occupational Stress Questionnaire     Feeling of Stress : Not at all   Social Connections: Unknown (11/02/2021)    Received from [facility]    Social Connection and Isolation Panel     Are you married, widowed, divorced, separated, never married, or living with a partner?: Married     Review of Systems:  A full review of 10 systems is unremarkable except as stated in the HPI.     Physical Exam:  VITAL SIGNS:   Vitals:    09/20/23 1019   BP: 123/77   Pulse: 55   SpO2:       Wt Readings from Last 3 Encounters:   09/20/23 69.4 kg (153 lb)   07/05/23 68.4 kg (150 lb 12.8 oz)   05/31/23 68.9 kg (151 lb 12.8 oz)      Today's Body mass index is 26.26 kg/m??.   GENERAL: Patient is well-appearing in no acute distress.  HEENT: Normocephalic and atraumatic with anicteric sclerae.  NECK: Supple, JVP flat.   CARDIOVASCULAR: Regular S1S2 without murmur or gallop.  RESPIRATORY: Clear to auscultation without wheezes or rales.   ABDOMEN: Soft, non-tender, non-distended with audible bowel sounds. There is no organomegaly or palpable pulsatile mass.   EXTREMITIES:  Lower extremities are warm and without edema. Distal pulses are full and symmetric.   SKIN: No rashes, ecchymosis or petechiae.  NEURO: Alert, pleasant, and appropriate. Non-focal neuro exam  Pertinent Laboratory Studies:  Lab Results   Component Value Date    PRO-BNP 340.0 (H) 07/21/2023    PRO-BNP 576.0 (H) 05/31/2023    PRO-BNP 400.0 (H) 06/08/2017    Creatinine 0.85 07/21/2023    Creatinine 0.83 05/31/2023    BUN 13 07/21/2023    BUN 10 05/31/2023    Potassium 5.1 (H) 07/21/2023    Potassium 4.5 05/31/2023    Potassium, Bld 3.4 06/08/2017    Magnesium  1.9 03/11/2020    Magnesium  2.1 11/24/2017    AST 18 05/31/2023    AST 22 03/03/2021    ALT 15 05/31/2023    ALT 15 03/03/2021    Total Bilirubin 0.4 05/31/2023    Total Bilirubin 0.5 03/03/2021    INR 1.06 11/24/2017    INR 1.22 04/15/2016    WBC 3.7 03/03/2021    WBC <0.1 (LL) 03/05/2016    HGB 12.4 03/03/2021    Hemoglobin 10.9 (L) 06/08/2017    HCT 37.3 03/03/2021    Platelet 262 03/03/2021       Other pertinent records were reviewed.    Pertinent Test Results from Today:  Echo (prelim, my interpretation): EF roughly 25-30%. Improved from prior.

## 2023-09-20 NOTE — Unmapped (Signed)
 Very nice to see you this morning! Keep up the excellent work with exercise.    With respect to medication I would recommend:  Please stop taking lisinopril . 48 hours after your last dose, please start taking Entresto  24/26 mg twice daily.  Please have bloodwork done at [facility] 7-10 days after starting the Entresto .  Please check your blood pressure periodically and reach out to me if the systolic pressure (top number) is lower than 90 or if you experience concerning symptoms.  Please reach out to me in a month or so to consider increasing the dose of Entresto .  Please continue metoprolol  at the current dose.

## 2023-09-21 ENCOUNTER — Other Ambulatory Visit (HOSPITAL_COMMUNITY): Payer: Self-pay

## 2023-10-06 ENCOUNTER — Ambulatory Visit: Admit: 2023-10-06 | Discharge: 2023-10-07 | Payer: Medicare (Managed Care)

## 2023-10-06 ENCOUNTER — Ambulatory Visit
Admission: RE | Admit: 2023-10-06 | Discharge: 2023-10-06 | Disposition: A | Discharge status: Home / Self Care | Source: Ambulatory Visit | Attending: Family | Admitting: Family

## 2023-10-06 DIAGNOSIS — Z780 Asymptomatic menopausal state: Secondary | ICD-10-CM | POA: Insufficient documentation

## 2023-10-06 DIAGNOSIS — Z1231 Encounter for screening mammogram for malignant neoplasm of breast: Secondary | ICD-10-CM

## 2023-10-06 DIAGNOSIS — I5022 Chronic systolic (congestive) heart failure: Secondary | ICD-10-CM | POA: Diagnosis not present

## 2023-10-06 DIAGNOSIS — M8589 Other specified disorders of bone density and structure, multiple sites: Secondary | ICD-10-CM | POA: Diagnosis not present

## 2023-10-06 LAB — BASIC METABOLIC PANEL
ANION GAP: 12 mmol/L (ref 5–14)
BLOOD UREA NITROGEN: 7 mg/dL — ABNORMAL LOW (ref 9–23)
BUN / CREAT RATIO: 9
CALCIUM: 9.9 mg/dL (ref 8.7–10.4)
CHLORIDE: 104 mmol/L (ref 98–107)
CO2: 30.1 mmol/L (ref 20.0–31.0)
CREATININE: 0.82 mg/dL (ref 0.55–1.02)
EGFR CKD-EPI (2021) FEMALE: 77 mL/min/1.73m2 (ref >=60)
GLUCOSE RANDOM: 93 mg/dL (ref 70–179)
POTASSIUM: 4.5 mmol/L (ref 3.4–4.8)
SODIUM: 146 mmol/L — ABNORMAL HIGH (ref 135–145)

## 2023-10-23 MED FILL — METOPROLOL SUCCINATE ER 50 MG TABLET,EXTENDED RELEASE 24 HR: ORAL | 90 days supply | Qty: 90 | Fill #1

## 2023-11-17 MED FILL — SACUBITRIL 24 MG-VALSARTAN 26 MG TABLET: ORAL | 60 days supply | Qty: 120 | Fill #1

## 2023-12-20 ENCOUNTER — Inpatient Hospital Stay: Admit: 2023-12-20 | Discharge: 2023-12-20 | Payer: Medicare (Managed Care)

## 2023-12-20 ENCOUNTER — Ambulatory Visit
Admit: 2023-12-20 | Discharge: 2023-12-20 | Payer: Medicare (Managed Care) | Attending: Cardiovascular Disease | Primary: Cardiovascular Disease

## 2023-12-20 DIAGNOSIS — Z9484 Stem cells transplant status: Principal | ICD-10-CM

## 2023-12-20 DIAGNOSIS — I5022 Chronic systolic (congestive) heart failure: Principal | ICD-10-CM

## 2023-12-20 DIAGNOSIS — C9201 Acute myeloblastic leukemia, in remission: Principal | ICD-10-CM

## 2023-12-20 DIAGNOSIS — I427 Cardiomyopathy due to drug and external agent: Principal | ICD-10-CM

## 2023-12-20 MED ORDER — SACUBITRIL 49 MG-VALSARTAN 51 MG TABLET
ORAL_TABLET | Freq: Two times a day (BID) | ORAL | 3 refills | 100.00000 days | Status: CP
Start: 2023-12-20 — End: 2025-01-23
  Filled 2023-12-22: qty 120, 60d supply, fill #0

## 2023-12-20 NOTE — Progress Notes (Signed)
 [facility] Clinic Followup Note    Referring Provider:    Primary Provider: Dineen Rollene Eagles, NP  [address] [facility] [address]  [place] [address]     Reason for Visit:  Christine [name] is a 68 y.o. female who returns for ongoing evaluation and management of cardiomyopathy diagnosed in the setting of treatment for AML.     Assessment & Plan:  1. Cardiomyopathy  The etiology of her cardiomyopathy is not completely certain. Her EF dropped to 35% in 2017, possibly related to daunorubicin exposure then normalized by 2019. A surveillance echocardiogram in May 2025 showed an EF of 20-25%. The timing of this recrudescence seems unusual for anthracycline induced cardiotoxicity, though if there is no other obvious etiology we may end up ascribing to her chemotherapy exposure. Coronary CTA showed no CAD. She appears euvolemic and reports NYHA Class I symptoms. She is quite hesitant to pursue medical therapy. Echo this morning shows improved LVEF visually estimated at 40-45%.  - increase Entresto  to 49-51 mg BID  - continue metoprolol  XL 50 mg daily (further titration likely limited by HR)  - follow up formal echo read     2. Blood pressure  Her blood pressure was marginally elevated at a clinic visit in 2019 but has been normal since then. Does not check BPs at home regularly. BP in clinic today 134/75, so should be able to tolerate increased Entresto  dose.      3. Left bundle branch block  Transient during chemotherapy     4. Acute myeloid leukemia  S/p SCT  Now 10 years post SCT. Discharged from heme-onc surveillance.     Follow-up:  RTC in 3 months with an echo.     History of Present Illness:  Christine [name] returns for ongoing evaluation and management of cardiomyopathy in the setting of treatment for AML. Prior to AML treatment, Christine [name] had no history of heart disease of any sort.  She underwent stem cell transplant on February 24, 2016. During treatment for AML she experienced a drop in her ejection fraction that subsequently normalized by 2019. Unfortunately a surveillance echo in May 2025 showed a drop in EF to 25%.    Interval History  Today in clinic, Christine [name] again reports feeling well with no complaints. She continues to go to the [facility] 6 times weekly where she does a mixture of cardiovascular, flexibility, and weight training. She walks roughly 3 miles most days with her husband a couple of days a week, including a hilly route with no DOE. She denies any chest pain or dyspnea with her activities.  She also denies orthopnea, paroxysmal nocturnal dyspnea, or LE edema. No palpitations, chest pain, dizziness, pre-syncope or syncope.        Cardiovascular History & Procedures:    Cath / PCI:  None  CV Surgery:  None  EP Procedures and Devices:  None    Non-Invasive Evaluation(s):  Echo:  Echo:  08/28/15  Normal left ventricular systolic function, ejection fraction > 55%  Dilated left atrium - mild  Normal right ventricular systolic function  No significant valvular abnormalities  Degenerative mitral valve disease  Mitral regurgitation - mild     11/04/15  Left ventricular hypertrophy - mild  EF estimated 45-50%, with septal dyskinesis likely due to new LBBB, this is a new finding compared to echo from 08/2015  Diastolic dysfunction - grade II (elevated filling pressures)  Degenerative mitral valve disease  Dilated left atrium -  mild  Aortic sclerosis  Normal right ventricular systolic function     12/03/15 - Limited   Dilated left ventricle - mild  Moderately decreased left ventricular systolic function, ejection fraction 35%  Diastolic dysfunction - grade I (normal filling pressures)  Mitral regurgitation - mild  Normal right ventricular systolic function  Pericardial effusion - small     10/26/16 - Limited   Borderline left ventricular systolic function  Normal right ventricular systolic function     11/29/17 - Limited   Normal left ventricular systolic function, ejection fraction 55 to 60%  Diastolic dysfunction - grade I (normal filling pressures)  Normal right ventricular systolic function     11/28/18 - Limited   Normal left ventricular size and systolic function, ejection fraction 55-60%.  Diastolic dysfunction - grade I (normal filling pressures).  Normal right ventricular size and systolic function.     12/11/19 - Limited   The left ventricle is normal in size with normal wall thickness.  The left ventricular systolic function is overall normal, LVEF is visually estimated at 50-55%.  There is grade I diastolic dysfunction (impaired relaxation).  The right ventricle is normal in size, with normal systolic function.     02/17/21 - Limited  The left ventricle is normal in size with normal wall thickness.  The left ventricular systolic function is normal, LVEF is visually estimated at 55-60%.  The right ventricle is normal in size, with normal systolic function.     05/31/23 - Limited:  The left ventricle is upper normal in size with normal wall thickness.  The left ventricular systolic function is severely decreased, LVEF is visually estimated at 25%.  The right ventricle is normal in size, with normal systolic function.  IVC size and inspiratory change suggest mildly elevated right atrial pressure. (5-10 mmHg).  LV global longitudinal strain: -10.3 %.       Cardiac CT/MRI/Nuclear Tests:  06/14/23 Cardiac CTA - CAD-RADS 0. No plaque or stenosis; absence of CAD.     Current Medications:  Current Outpatient Medications   Medication Instructions    ascorbic acid , vitamin C , (VITAMIN C ) 500 MG tablet Take by mouth.    calcium  carbonate-vitamin D3 600 mg(1,500mg ) -800 unit Tab 1 tablet, Oral, 2 times a day    metoPROLOL  succinate (TOPROL -XL) 50 mg, Oral, Daily (standard)    rosuvastatin (CRESTOR) 5 MG tablet 1 tablet, Daily (standard)    sacubitril -valsartan  (ENTRESTO ) 24-26 mg tablet 1 tablet, Oral, 2 times a day (standard)       Allergies:  Dapsone, Penicillins, Rasburicase, Sulfasalazine, Vancomycin, Adhesive, Amoxicillin, Cefepime, Morphine, and Tapentadol    Family History:  The patient's family history includes Breast cancer in her mother; Cancer in her maternal aunt and paternal grandfather.    Social history:  She  reports that she has never smoked. She has never used smokeless tobacco. She reports that she does not drink alcohol and does not use drugs.    Review of Systems:  As per HPI.  Rest of the review of systems is negative or unremarkable except as stated above.     Physical Exam:  VITAL SIGNS:   Vitals:    12/20/23 1108   BP: 134/75   Pulse: 55   Resp: 20   SpO2: 94%      Wt Readings from Last 3 Encounters:   12/20/23 69.3 kg (152 lb 12.8 oz)   09/20/23 69.4 kg (153 lb)   07/05/23 68.4 kg (150 lb 12.8 oz)  Today's Body mass index is 26.23 kg/m??.   Height: 162.6 cm (5' 4)  CONSTITUTIONAL: well-appearing in no acute distress  EYES: Conjunctivae and sclerae clear and anicteric.  CARDIOVASCULAR: JVP not seen above the clavicle. Rate and rhythm are regular. There is no lifts or heaves.  Normal S1, S2. There is no murmur, gallops or rubs. There is no pedal edema, bilaterally.   RESPIRATORY: Normal respiratory effort. Clear to auscultation bilaterally..  There are no wheezes.  NEURO/PSYCH: Appropriate mood and affect. Very pleasant. Alert and oriented to person, place, and time. No gross motor or sensory deficits evident.    Labs & Imaging:  Reviewed in EPIC.   No visits with results within 1 Month(s) from this visit.   Latest known visit with results is:   Appointment on 10/06/2023   Component Date Value Ref Range Status    Sodium 10/06/2023 146 (H)  135 - 145 mmol/L Final    Potassium 10/06/2023 4.5  3.4 - 4.8 mmol/L Final    Chloride 10/06/2023 104  98 - 107 mmol/L Final    CO2 10/06/2023 30.1  20.0 - 31.0 mmol/L Final    Anion Gap 10/06/2023 12  5 - 14 mmol/L Final    BUN 10/06/2023 7 (L)  9 - 23 mg/dL Final    Creatinine 90/74/7974 0.82  0.55 - 1.02 mg/dL Final    BUN/Creatinine Ratio 10/06/2023 9   Final    eGFR CKD-EPI (2021) Female 10/06/2023 77  >=60 mL/min/1.31m2 Final    Glucose 10/06/2023 93  70 - 179 mg/dL Final    Calcium  10/06/2023 9.9  8.7 - 10.4 mg/dL Final       Lab Results   Component Value Date    PRO-BNP 340.0 (H) 07/21/2023    PRO-BNP 576.0 (H) 05/31/2023    PRO-BNP 400.0 (H) 06/08/2017    Creatinine 0.82 10/06/2023    Creatinine 0.85 07/21/2023    Creatinine 0.83 05/31/2023    BUN 7 (L) 10/06/2023    BUN 13 07/21/2023    BUN 10 05/31/2023    Sodium 146 (H) 10/06/2023    Sodium 145 07/21/2023    Sodium 145 05/31/2023    Sodium Whole Blood 131 (L) 06/08/2017    Potassium 4.5 10/06/2023    Potassium 5.1 (H) 07/21/2023    Potassium 4.5 05/31/2023    Potassium, Bld 3.4 06/08/2017    Magnesium  1.9 03/11/2020    Magnesium  2.1 11/24/2017    Magnesium  2.0 11/13/2017    Total Bilirubin 0.4 05/31/2023    Total Bilirubin 0.5 03/03/2021    INR 1.06 11/24/2017       No results found for: DIGOXIN    No results found for: TSH, CHOL, TRIG, HDL, NONHDL, LDL    Lab Results   Component Value Date    WBC 3.7 03/03/2021    WBC <0.1 (LL) 03/05/2016    HGB 12.4 03/03/2021    Hemoglobin 10.9 (L) 06/08/2017    HCT 37.3 03/03/2021    Platelet 262 03/03/2021       Other pertinent records were reviewed.    The following are further history from the patient's EPIC record for reference:   Past Medical History[1]    Past Surgical History[2]     Almarie Ned, MS4    Attending attestation Bard Primrose, MDD Heart Failure/Transplantation/VAD)  I saw and evaluated the patient, participating in the key portions of the service. I reviewed the medical student???s note. I agree with the medical student???s findings and plan.  Pertinent Test Results from Today:  Echo (preliminary, my interpretation): Echo this morning. LVEF visually estimated at 40-45%.  See official Echo report.         [1]   Past Medical History:  Diagnosis Date    Allogeneic stem cell transplant (RAF-HCC) 02/24/2016    Conditioning MAC FluBu (BMT CTN 1301- control arm) Donor: Sister, 10/10, CMV negative, ABO: O- Recip: CMV negative, ABO: O- Source: MARROW Cell dose: 3.18 x 10^6 CD34/kg GVHD prophylaxis: Tacrolimus, methotrexate    AML (acute myelogenous leukemia) (CMS-HCC)     Cardiomyopathy (CMS-HCC)     most likely from daunorubicin    DVT (deep venous thrombosis) (CMS-HCC)     Hypertension     Osteoporosis     Stopped medications due to interactions with chemo drugs   [2]   Past Surgical History:  Procedure Laterality Date    IR INSERT PORT AGE GREATER THAN 5 YRS  12/19/2015    IR INSERT PORT AGE GREATER THAN 5 YRS 12/19/2015 Jed Cap, MD IMG VIR MM MMNT    PR LAP,APPENDECTOMY N/A 11/08/2017    Procedure: LAPAROSCOPY SURGICAL APPENDECTOMY;  Surgeon: Emeline Lamar Dandy, MD;  Location: MAIN OR [facility];  Service: General Surgery    PR UPPER GI ENDOSCOPY,BIOPSY N/A 05/05/2016    Procedure: UGI ENDOSCOPY; WITH BIOPSY, SINGLE OR MULTIPLE;  Surgeon: Donna Rodgers Aland, MD;  Location: GI PROCEDURES [facility];  Service: Gastroenterology    SHOULDER SURGERY

## 2024-01-09 MED FILL — METOPROLOL SUCCINATE ER 50 MG TABLET,EXTENDED RELEASE 24 HR: ORAL | 90 days supply | Qty: 90 | Fill #2

## 2024-01-17 ENCOUNTER — Ambulatory Visit (INDEPENDENT_AMBULATORY_CARE_PROVIDER_SITE_OTHER): Admitting: *Deleted

## 2024-01-17 VITALS — Ht 64.0 in | Wt 150.0 lb

## 2024-01-17 DIAGNOSIS — Z0000 Encounter for general adult medical examination without abnormal findings: Secondary | ICD-10-CM

## 2024-01-17 NOTE — Patient Instructions (Signed)
 Robyn Montgomery,  Thank you for taking the time for your Medicare Wellness Visit. I appreciate your continued commitment to your health goals. Please review the care plan we discussed, and feel free to reach out if I can assist you further.  Please note that Annual Wellness Visits do not include a physical exam. Some assessments may be limited, especially if the visit was conducted virtually. If needed, we may recommend an in-person follow-up with your provider.  Ongoing Care Seeing your primary care provider every 3 to 6 months helps us  monitor your health and provide consistent, personalized care.  Remember to keep your appointment scheduled for a colonoscopy consult.   Referrals If a referral was made during today's visit and you haven't received any updates within two weeks, please contact the referred provider directly to check on the status.  Recommended Screenings:  Health Maintenance  Topic Date Due   Pap Smear  08/18/2023   COVID-19 Vaccine (6 - 2025-26 season) 09/12/2023   Colon Cancer Screening  11/16/2023   Breast Cancer Screening  10/05/2024   Medicare Annual Wellness Visit  01/16/2025   DTaP/Tdap/Td vaccine (5 - Tdap) 03/01/2028   Pneumococcal Vaccine for age over 29  Completed   Flu Shot  Completed   Osteoporosis screening with Bone Density Scan  Completed   Hepatitis C Screening  Completed   Zoster (Shingles) Vaccine  Completed   Meningitis B Vaccine  Aged Out   Hepatitis B Vaccine  Discontinued       01/17/2024    1:48 PM  Advanced Directives  Does Patient Have a Medical Advance Directive? Yes  Type of Estate Agent of [place];Living will  Does patient want to make changes to medical advance directive? No - Patient declined  Copy of Healthcare Power of Attorney in Chart? No - copy requested    Vision: Annual vision screenings are recommended for early detection of glaucoma, cataracts, and diabetic retinopathy. These exams can also reveal signs  of chronic conditions such as diabetes and high blood pressure.  Dental: Annual dental screenings help detect early signs of oral cancer, gum disease, and other conditions linked to overall health, including heart disease and diabetes.  Please see the attached documents for additional preventive care recommendations.

## 2024-01-17 NOTE — Progress Notes (Signed)
 "  Chief Complaint  Patient presents with   Medicare Wellness     Subjective:   Robyn Montgomery is a 69 y.o. female who presents for a Medicare Annual Wellness Visit.  Visit info / Clinical Intake: Medicare Wellness Visit Type:: Subsequent Annual Wellness Visit Persons participating in visit and providing information:: patient Medicare Wellness Visit Mode:: Telephone If telephone:: video declined Since this visit was completed virtually, some vitals may be partially provided or unavailable. Missing vitals are due to the limitations of the virtual format.: Unable to obtain vitals - no equipment If Telephone or Video please confirm:: I connected with patient using audio/video enable telemedicine. I verified patient identity with two identifiers, discussed telehealth limitations, and patient agreed to proceed. Patient Location:: Home Provider Location:: Office/Home Interpreter Needed?: No Pre-visit prep was completed: yes AWV questionnaire completed by patient prior to visit?: no Living arrangements:: lives with spouse/significant other Patient's Overall Health Status Rating: very good Typical amount of pain: none Does pain affect daily life?: no Are you currently prescribed opioids?: no  Dietary Habits and Nutritional Risks How many meals a day?: 2 Eats fruit and vegetables daily?: yes Most meals are obtained by: preparing own meals In the last 2 weeks, have you had any of the following?: none Diabetic:: no  Functional Status Activities of Daily Living (to include ambulation/medication): Independent Ambulation: Independent Medication Administration: Independent Home Management (perform basic housework or laundry): Independent Manage your own finances?: yes Primary transportation is: driving Concerns about vision?: no *vision screening is required for WTM* Concerns about hearing?: no  Fall Screening Falls in the past year?: 0 Number of falls in past year: 0 Was there an  injury with Fall?: 0 Fall Risk Category Calculator: 0 Patient Fall Risk Level: Low Fall Risk  Fall Risk Patient at Risk for Falls Due to: No Fall Risks Fall risk Follow up: Falls evaluation completed; Falls prevention discussed  Home and Transportation Safety: All rugs have non-skid backing?: N/A, no rugs All stairs or steps have railings?: yes Grab bars in the bathtub or shower?: (!) no Have non-skid surface in bathtub or shower?: yes Good home lighting?: yes Regular seat belt use?: yes Hospital stays in the last year:: no  Cognitive Assessment Difficulty concentrating, remembering, or making decisions? : no Will 6CIT or Mini Cog be Completed: yes What year is it?: 0 points What month is it?: 0 points Give patient an address phrase to remember (5 components): [address] [place] About what time is it?: 0 points Count backwards from 20 to 1: 0 points Say the months of the year in reverse: 0 points Repeat the address phrase from earlier: 0 points 6 CIT Score: 0 points  Advance Directives (For Healthcare) Does Patient Have a Medical Advance Directive?: Yes Does patient want to make changes to medical advance directive?: No - Patient declined Type of Advance Directive: Healthcare Power of [place]; Living will Copy of Healthcare Power of Attorney in Chart?: No - copy requested Copy of Living Will in Chart?: No - copy requested  Reviewed/Updated  Reviewed/Updated: Reviewed All (Medical, Surgical, Family, Medications, Allergies, Care Teams, Patient Goals)    Allergies (verified) Dapsone, Rasburicase, Sulfasalazine, Penicillins, Sulfa antibiotics, Vancomycin, Cefepime, Morphine, Tape, and Tapentadol   Current Medications (verified) Outpatient Encounter Medications as of 01/17/2024  Medication Sig   ascorbic acid (VITAMIN C) 500 MG tablet Take by mouth.   Calcium  Carb-Cholecalciferol 600-800 MG-UNIT TABS Take by mouth.   metoprolol succinate (TOPROL-XL) 50 MG 24 hr  tablet Take 50 mg by mouth daily. Take with or immediately following a meal.   rosuvastatin  (CRESTOR ) 5 MG tablet Take 1 tablet (5 mg total) by mouth daily.   sacubitril-valsartan (ENTRESTO) 24-26 MG Take 1 tablet by mouth 2 (two) times daily.   lisinopril (ZESTRIL) 2.5 MG tablet Take 2.5 mg by mouth daily. (Patient not taking: Reported on 01/17/2024)   No facility-administered encounter medications on file as of 01/17/2024.    History: Past Medical History:  Diagnosis Date   Basal cell carcinoma 01/20/2006   left upper back paraspinal   Basal cell carcinoma 06/22/2012   right sup med scapula, right med breast   Basal cell carcinoma 10/04/2013   right side near costal area, right chest   Basal cell carcinoma 10/07/2014   left mid back 2.0 cm lat to spine above braline   Basal cell carcinoma 05/26/2015   right sup nasolabial   Basal cell carcinoma 06/23/2016   left midline forehead   Basal cell carcinoma 12/13/2016   left sup chest, right lat elbow, right prox med calf   Basal cell carcinoma 06/13/2017   left preauricular, left medial scapula   Basal cell carcinoma 06/19/2018   left ant lat thigh, right mid pretibial   Basal cell carcinoma 12/25/2018   right sup med scapula   Basal cell carcinoma 06/28/2019   Upper back spinal   Basal cell carcinoma 06/28/2019   R forehead   Basal cell carcinoma 12/13/2019   left mid parasternal treated with EDC 01/31/20   Cancer (HCC)    leukemia- post stem cell transplant   Dysplastic nevi 08/14/2019   L mid posterior thigh   Dysplastic nevus 01/16/2007   left upper gastric   Dysplastic nevus 10/04/2013   right UQA, right sacral lower back   Dysplastic nevus 06/23/2016   right prox tricep   Dysplastic nevus 12/13/2019   left low back paraspinal moderate atypia    Leukemia (HCC)    2017   Osteoporosis    Personal history of chemotherapy    Past Surgical History:  Procedure Laterality Date   BREAST BIOPSY Left    mole removed above  left nipple, no bx   broken shoulder Right 2012   Plates and screws put in   LIMBAL STEM CELL TRANSPLANT  02/12/2016   MOLE REMOVAL  1977   Removed from left breast   SHOULDER SURGERY     Family History  Problem Relation Age of Onset   Arthritis Mother    Hypertension Mother    Cancer Mother 56       Breast   Osteoporosis Mother    Breast cancer Mother 50   Parkinsonism Father    Heart disease Maternal Uncle    Cancer Paternal Grandfather        Oral   Social History   Occupational History   Not on file  Tobacco Use   Smoking status: Never    Passive exposure: Never   Smokeless tobacco: Never  Vaping Use   Vaping status: Never Used  Substance and Sexual Activity   Alcohol use: Not Currently    Alcohol/week: 0.0 standard drinks of alcohol    Comment: Rare   Drug use: No   Sexual activity: Yes    Partners: Male    Comment: Husband    Tobacco Counseling Counseling given: Not Answered  SDOH Screenings   Food Insecurity: No Food Insecurity (01/17/2024)  Housing: Low Risk (01/17/2024)  Transportation Needs: No Transportation Needs (01/17/2024)  Utilities: Not At Risk (01/17/2024)  Alcohol Screen: Low Risk (01/17/2024)  Depression (PHQ2-9): Low Risk (01/17/2024)  Financial Resource Strain: Low Risk (01/17/2024)  Physical Activity: Sufficiently Active (01/17/2024)  Social Connections: Moderately Integrated (01/17/2024)  Stress: No Stress Concern Present (01/17/2024)  Tobacco Use: Low Risk (01/17/2024)  Health Literacy: Adequate Health Literacy (01/17/2024)   See flowsheets for full screening details  Depression Screen PHQ 2 & 9 Depression Scale- Over the past 2 weeks, how often have you been bothered by any of the following problems? Little interest or pleasure in doing things: 0 Feeling down, depressed, or hopeless (PHQ Adolescent also includes...irritable): 0 PHQ-2 Total Score: 0 Trouble falling or staying asleep, or sleeping too much: 1 Feeling tired or having little energy:  0 Poor appetite or overeating (PHQ Adolescent also includes...weight loss): 0 Feeling bad about yourself - or that you are a failure or have let yourself or your family down: 0 Trouble concentrating on things, such as reading the newspaper or watching television (PHQ Adolescent also includes...like school work): 0 Moving or speaking so slowly that other people could have noticed. Or the opposite - being so fidgety or restless that you have been moving around a lot more than usual: 0 Thoughts that you would be better off dead, or of hurting yourself in some way: 0 PHQ-9 Total Score: 1 If you checked off any problems, how difficult have these problems made it for you to do your work, take care of things at home, or get along with other people?: Not difficult at all     Goals Addressed             This Visit's Progress    Patient Stated       Wants to walk more often             Objective:    Today's Vitals   01/17/24 1345  Weight: 150 lb (68 kg)  Height: 5' 4 (1.626 m)   Body mass index is 25.75 kg/m.  Hearing/Vision screen Hearing Screening - Comments:: No issues Vision Screening - Comments:: Contacts, [facility],up to date Immunizations and Health Maintenance Health Maintenance  Topic Date Due   Cervical Cancer Screening (Pap smear)  08/18/2023   COVID-19 Vaccine (6 - 2025-26 season) 09/12/2023   Colonoscopy  11/16/2023   Mammogram  10/05/2024   Medicare Annual Wellness (AWV)  01/16/2025   DTaP/Tdap/Td (5 - Tdap) 03/01/2028   Pneumococcal Vaccine: 50+ Years  Completed   Influenza Vaccine  Completed   Bone Density Scan  Completed   Hepatitis C Screening  Completed   Zoster Vaccines- Shingrix  Completed   Meningococcal B Vaccine  Aged Out   Hepatitis B Vaccines 19-59 Average Risk  Discontinued        Assessment/Plan:  This is a routine wellness examination for [place].  Patient Care Team: Dineen Rollene MATSU, FNP as PCP - General (Family Medicine) Sheryle Redell BROCKS, MD as Referring Physician (Cardiology)  I have personally reviewed and noted the following in the patients chart:   Medical and social history Use of alcohol, tobacco or illicit drugs  Current medications and supplements including opioid prescriptions. Functional ability and status Nutritional status Physical activity Advanced directives List of other physicians Hospitalizations, surgeries, and ER visits in previous 12 months Vitals Screenings to include cognitive, depression, and falls Referrals and appointments  No orders of the defined types were placed in this encounter.  In addition, I have reviewed and discussed with patient certain  preventive protocols, quality metrics, and best practice recommendations. A written personalized care plan for preventive services as well as general preventive health recommendations were provided to patient.   Angeline Fredericks, LPN   08/14/7971   Return in 1 year (on 01/16/2025).  After Visit Summary: (MyChart) Due to this being a telephonic visit, the after visit summary with patients personalized plan was offered to patient via MyChart   Nurse Notes: Patient stated that she has a GI Consult scheduled at [facility] 02/09/24. Patient stated that she does not feel at her age she needs a pap smear.  "

## 2024-03-22 ENCOUNTER — Ambulatory Visit: Admitting: Dermatology

## 2024-08-30 ENCOUNTER — Encounter: Admitting: Family
